# Patient Record
Sex: Male | Born: 1935 | Race: White | Hispanic: No | Marital: Married | Smoking: Former smoker
Health system: Southern US, Community
[De-identification: ages and names within clinical notes are randomized; demographics above are authoritative.]

## PROBLEM LIST (undated history)

## (undated) DIAGNOSIS — D509 Iron deficiency anemia, unspecified: Secondary | ICD-10-CM

## (undated) DIAGNOSIS — E785 Hyperlipidemia, unspecified: Secondary | ICD-10-CM

## (undated) DIAGNOSIS — N400 Enlarged prostate without lower urinary tract symptoms: Secondary | ICD-10-CM

## (undated) DIAGNOSIS — K259 Gastric ulcer, unspecified as acute or chronic, without hemorrhage or perforation: Secondary | ICD-10-CM

## (undated) DIAGNOSIS — K449 Diaphragmatic hernia without obstruction or gangrene: Secondary | ICD-10-CM

## (undated) DIAGNOSIS — R490 Dysphonia: Secondary | ICD-10-CM

## (undated) DIAGNOSIS — Z9289 Personal history of other medical treatment: Secondary | ICD-10-CM

## (undated) DIAGNOSIS — R1314 Dysphagia, pharyngoesophageal phase: Secondary | ICD-10-CM

## (undated) DIAGNOSIS — G7000 Myasthenia gravis without (acute) exacerbation: Secondary | ICD-10-CM

## (undated) DIAGNOSIS — C801 Malignant (primary) neoplasm, unspecified: Secondary | ICD-10-CM

## (undated) DIAGNOSIS — K30 Functional dyspepsia: Secondary | ICD-10-CM

## (undated) DIAGNOSIS — H3530 Unspecified macular degeneration: Secondary | ICD-10-CM

## (undated) DIAGNOSIS — IMO0002 Reserved for concepts with insufficient information to code with codable children: Secondary | ICD-10-CM

## (undated) DIAGNOSIS — G629 Polyneuropathy, unspecified: Secondary | ICD-10-CM

## (undated) DIAGNOSIS — E538 Deficiency of other specified B group vitamins: Secondary | ICD-10-CM

## (undated) DIAGNOSIS — K117 Disturbances of salivary secretion: Secondary | ICD-10-CM

## (undated) DIAGNOSIS — D126 Benign neoplasm of colon, unspecified: Secondary | ICD-10-CM

## (undated) DIAGNOSIS — I10 Essential (primary) hypertension: Secondary | ICD-10-CM

## (undated) DIAGNOSIS — E559 Vitamin D deficiency, unspecified: Secondary | ICD-10-CM

## (undated) DIAGNOSIS — H269 Unspecified cataract: Secondary | ICD-10-CM

## (undated) DIAGNOSIS — K219 Gastro-esophageal reflux disease without esophagitis: Secondary | ICD-10-CM

## (undated) HISTORY — DX: Gastro-esophageal reflux disease without esophagitis: K21.9

## (undated) HISTORY — DX: Reserved for concepts with insufficient information to code with codable children: IMO0002

## (undated) HISTORY — DX: Functional dyspepsia: K30

## (undated) HISTORY — DX: Essential (primary) hypertension: I10

## (undated) HISTORY — DX: Vitamin D deficiency, unspecified: E55.9

## (undated) HISTORY — DX: Unspecified macular degeneration: H35.30

## (undated) HISTORY — DX: Diaphragmatic hernia without obstruction or gangrene: K44.9

## (undated) HISTORY — DX: Iron deficiency anemia, unspecified: D50.9

## (undated) HISTORY — PX: GASTROSTOMY W/ FEEDING TUBE: SUR642

## (undated) HISTORY — PX: HERNIA REPAIR: SHX51

## (undated) HISTORY — DX: Unspecified cataract: H26.9

## (undated) HISTORY — DX: Benign neoplasm of colon, unspecified: D12.6

## (undated) HISTORY — DX: Deficiency of other specified B group vitamins: E53.8

## (undated) HISTORY — PX: OTHER SURGICAL HISTORY: SHX169

## (undated) HISTORY — DX: Hyperlipidemia, unspecified: E78.5

## (undated) HISTORY — DX: Polyneuropathy, unspecified: G62.9

## (undated) HISTORY — PX: TONSILLECTOMY: SUR1361

## (undated) HISTORY — DX: Disturbances of salivary secretion: K11.7

## (undated) HISTORY — DX: Dysphonia: R49.0

## (undated) HISTORY — PX: CHOLECYSTECTOMY: SHX55

## (undated) HISTORY — DX: Benign prostatic hyperplasia without lower urinary tract symptoms: N40.0

## (undated) HISTORY — DX: Gastric ulcer, unspecified as acute or chronic, without hemorrhage or perforation: K25.9

## (undated) HISTORY — DX: Dysphagia, pharyngoesophageal phase: R13.14

## (undated) HISTORY — DX: Myasthenia gravis without (acute) exacerbation: G70.00

---

## 1963-09-16 DIAGNOSIS — G629 Polyneuropathy, unspecified: Secondary | ICD-10-CM

## 1963-09-16 HISTORY — DX: Polyneuropathy, unspecified: G62.9

## 1998-12-20 ENCOUNTER — Ambulatory Visit (HOSPITAL_BASED_OUTPATIENT_CLINIC_OR_DEPARTMENT_OTHER): Admission: RE | Admit: 1998-12-20 | Discharge: 1998-12-20 | Payer: Self-pay | Admitting: Otolaryngology

## 1999-09-16 DIAGNOSIS — IMO0002 Reserved for concepts with insufficient information to code with codable children: Secondary | ICD-10-CM

## 1999-09-16 HISTORY — DX: Reserved for concepts with insufficient information to code with codable children: IMO0002

## 2000-05-07 ENCOUNTER — Encounter (INDEPENDENT_AMBULATORY_CARE_PROVIDER_SITE_OTHER): Payer: Self-pay | Admitting: Specialist

## 2000-05-07 ENCOUNTER — Ambulatory Visit (HOSPITAL_BASED_OUTPATIENT_CLINIC_OR_DEPARTMENT_OTHER): Admission: RE | Admit: 2000-05-07 | Discharge: 2000-05-07 | Payer: Self-pay | Admitting: Otolaryngology

## 2000-09-15 HISTORY — PX: OTHER SURGICAL HISTORY: SHX169

## 2000-11-02 ENCOUNTER — Ambulatory Visit (HOSPITAL_COMMUNITY): Admission: RE | Admit: 2000-11-02 | Discharge: 2000-11-02 | Payer: Self-pay | Admitting: Gastroenterology

## 2002-08-05 ENCOUNTER — Ambulatory Visit (HOSPITAL_COMMUNITY): Admission: RE | Admit: 2002-08-05 | Discharge: 2002-08-05 | Payer: Self-pay | Admitting: Orthopedic Surgery

## 2002-08-05 ENCOUNTER — Encounter: Payer: Self-pay | Admitting: Orthopedic Surgery

## 2002-08-12 ENCOUNTER — Encounter: Admission: RE | Admit: 2002-08-12 | Discharge: 2002-08-12 | Payer: Self-pay | Admitting: Orthopedic Surgery

## 2002-08-12 ENCOUNTER — Encounter: Payer: Self-pay | Admitting: Orthopedic Surgery

## 2002-08-15 ENCOUNTER — Ambulatory Visit (HOSPITAL_BASED_OUTPATIENT_CLINIC_OR_DEPARTMENT_OTHER): Admission: RE | Admit: 2002-08-15 | Discharge: 2002-08-15 | Payer: Self-pay | Admitting: Orthopedic Surgery

## 2002-09-15 HISTORY — PX: HIATAL HERNIA REPAIR: SHX195

## 2002-09-20 ENCOUNTER — Ambulatory Visit (HOSPITAL_COMMUNITY): Admission: RE | Admit: 2002-09-20 | Discharge: 2002-09-20 | Payer: Self-pay | Admitting: Family Medicine

## 2002-09-20 ENCOUNTER — Inpatient Hospital Stay (HOSPITAL_COMMUNITY): Admission: AD | Admit: 2002-09-20 | Discharge: 2002-09-26 | Payer: Self-pay | Admitting: Internal Medicine

## 2002-09-20 ENCOUNTER — Encounter: Payer: Self-pay | Admitting: Family Medicine

## 2002-09-21 ENCOUNTER — Encounter: Payer: Self-pay | Admitting: Internal Medicine

## 2002-09-21 ENCOUNTER — Encounter: Payer: Self-pay | Admitting: Cardiology

## 2002-09-22 ENCOUNTER — Encounter: Payer: Self-pay | Admitting: Internal Medicine

## 2002-09-23 ENCOUNTER — Encounter: Payer: Self-pay | Admitting: Internal Medicine

## 2002-09-26 ENCOUNTER — Encounter: Payer: Self-pay | Admitting: Internal Medicine

## 2002-10-17 ENCOUNTER — Inpatient Hospital Stay (HOSPITAL_COMMUNITY): Admission: RE | Admit: 2002-10-17 | Discharge: 2002-10-20 | Payer: Self-pay | Admitting: *Deleted

## 2003-03-14 ENCOUNTER — Ambulatory Visit (HOSPITAL_COMMUNITY): Admission: RE | Admit: 2003-03-14 | Discharge: 2003-03-14 | Payer: Self-pay | Admitting: *Deleted

## 2003-03-27 ENCOUNTER — Ambulatory Visit (HOSPITAL_COMMUNITY): Admission: RE | Admit: 2003-03-27 | Discharge: 2003-03-27 | Payer: Self-pay | Admitting: *Deleted

## 2003-09-06 ENCOUNTER — Ambulatory Visit (HOSPITAL_COMMUNITY): Admission: RE | Admit: 2003-09-06 | Discharge: 2003-09-06 | Payer: Self-pay | Admitting: Gastroenterology

## 2003-09-06 ENCOUNTER — Encounter (INDEPENDENT_AMBULATORY_CARE_PROVIDER_SITE_OTHER): Payer: Self-pay | Admitting: *Deleted

## 2003-09-16 HISTORY — PX: STOMACH SURGERY: SHX791

## 2003-11-09 ENCOUNTER — Ambulatory Visit (HOSPITAL_COMMUNITY): Admission: RE | Admit: 2003-11-09 | Discharge: 2003-11-09 | Payer: Self-pay | Admitting: Gastroenterology

## 2006-08-18 DIAGNOSIS — C4491 Basal cell carcinoma of skin, unspecified: Secondary | ICD-10-CM

## 2006-08-18 HISTORY — DX: Basal cell carcinoma of skin, unspecified: C44.91

## 2007-07-30 ENCOUNTER — Encounter: Admission: RE | Admit: 2007-07-30 | Discharge: 2007-07-30 | Payer: Self-pay | Admitting: Internal Medicine

## 2007-09-16 DIAGNOSIS — H269 Unspecified cataract: Secondary | ICD-10-CM

## 2007-09-16 HISTORY — DX: Unspecified cataract: H26.9

## 2008-05-03 DIAGNOSIS — C4492 Squamous cell carcinoma of skin, unspecified: Secondary | ICD-10-CM

## 2008-05-03 HISTORY — DX: Squamous cell carcinoma of skin, unspecified: C44.92

## 2011-01-31 NOTE — Discharge Summary (Signed)
NAME:  Victor Sutton, Victor Sutton                        ACCOUNT NO.:  [id number]   MEDICAL RECORD NO.:  [id number]                   PATIENT TYPE:  INP   LOCATION:  5154                                 FACILITY:  [facility]   PHYSICIAN:  Lazaro Arms, M.D.        DATE OF BIRTH:  07/23/1928   DATE OF ADMISSION:  09/20/2002  DATE OF DISCHARGE:  09/26/2002                                 DISCHARGE SUMMARY   PRIMARY CARE PHYSICIAN:  Dellis Anes. Idell Pickles, M.D.   GASTROENTEROLOGIST:  Danise Edge, M.D.   SURGEON:  Vikki Ports, M.D.   DISCHARGE DIAGNOSES:  1. Symptomatic anemia secondary to upper gastrointestinal bleed.  2. Upper gastrointestinal bleed, resolved, secondary to #3.  3. Paraesophageal hernia.  4. Hypertension.  5. Benign prostatic hypertrophy.  6. History of diverticulosis.  7. History of oral leukoplakia.   DISCHARGE MEDICATIONS:  1. Avapro 150 mg p.o. daily.  2. Flomax 0.4 mg p.o. daily.  3. Aspirin 325 mg p.o. daily.  4. Protonix 40 mg p.o. daily.  5. Iron sulfate 325 mg p.o. t.i.d. x90 days.  6. Folic acid 1 mg p.o. daily A54 days.  7. Thiamine 100 mg p.o. daily x90 days.  8. Colace 100 mg p.o. b.i.d. p.r.n.   ALLERGIES:  No known drug allergies.   PROCEDURES:  1. Esophagogastroduodenoscopy performed by Dr. Laural Benes on September 21, 2002,     tolerated without complications.  Findings include paraesophageal hernia     with three ulcerative erosions and small ulcers at the point of     transition.  No active bleeding.  2. Esophageal manometry performed on September 26, 2002 by the radiology     department at [facility].  Results are pending.   HISTORY OF PRESENT ILLNESS:  The patient was seen by his primary M.D. on  September 19, 2002 for increasing fatigue, dyspnea on exertion even with minor  exertion, pallor, cold intolerance over a three-week period.  He had CT of  chest today at [facility] to rule out PU.  There was no  acute disease noted  or no evidence of PE.  Hemoglobin drawn on September 19, 2002 at primary M.D's office indicated hemoglobin had decreased from 14.8 to  8.3 over a six-month period.  The patient had arthroscopic knee surgery on  left knee August 16, 2003 at [facility].  Denies nausea,  vomiting, bright red blood per rectum.  He has noticed stools getting darker  in color over the last three weeks.  Denies abdominal pain or change in  bowel pattern.  Denies fever.  States cold all the time, especially his  upper body.  He has noted fuzzy vision for 2-3 weeks, especially with  reading.  Recent increase in headaches from rare to few.  Takes Advil for  same.  Occasional night sweats for the last year with ankle swelling off and  on, left greater than right.  Last colonoscopy was performed in 2002.  Denies urinary changes.  No PND or orthopnea.  The patient is admitted for  further evaluation and treatment of symptomatic anemia.   HOSPITAL COURSE:  The patient's hemoglobin and hematocrit were reduced, as  noted above.  Vital signs at time of admission:  Temperature 96.6, blood  pressure 124/83, pulse 88, respirations 20, O2 saturations were 98% on room  air.  The patient did present as being pale and quite tired-appearing.  The  patient was transfused 2 units packed RBC's during this admission and his  hemoglobin on day of discharge is 10.7 with a hematocrit of 33.5.  Vital  signs are stable.  Pulse rate 76, blood pressure 117/66.   The patient underwent GI evaluation by Dr. Danise Edge on September 21, 2002  including an EGD which revealed the presence of a paraesophageal hernia with  multiple erosions which were ulcerated.  Dr. Laural Benes feels that the  patient's upper GI bleeding was most likely related to ischemia.  The  remainder of the patient's EGD was unremarkable.  Dr. Luan Pulling was  consulted, as she has performed surgery on the patient in the past.  She  consulted on the patient with Dr.  Laural Benes and Dr. Jonell Cluck and agreed  that the patient would require surgical correction of his hernia but that  esophageal manometry would have to be performed prior to surgery.  The  patient was scheduled for manometry to be performed on September 26, 2002.   As part of the patient's workup for symptomatic anemia, he was evaluated for  iron deficiency and found to in fact be iron deficient with iron stores of  less than 10 and a ferritin of 5.  The patient's vitamin B6 level was 17.9.  The patient was started on iron therapy and has continued on same.  He will  need iron therapy for three months' time approximately.  This needs to be  evaluated on a periodic basis by the patient's primary care M.D.  Reticulocyte count was also performed.  The patient was found to have 1.9%  reticulocytes and RBC count was 3.87 and absolute reticulocytes were 73.5.   Additionally, in order to work the patient up for symptomatic anemia and  fatigue, echocardiogram was obtained which initially indicated that the  patient may have cardiomyopathy with an EF of 30%-40%.  Because of this,  cardiology consult was requested and performed by Dr. Verdis Prime.  The  patient underwent stress echocardiogram which further clarified the  patient's cardiac status as being within normal limits.  EF was estimated at  65%, LV function was normal, and there were no indications of ischemia.  Additionally, cardiac enzymes were negative during this visit.  The  patient's lipid profile includes total cholesterol of 123, triglycerides of  73, HDL 45, LDL 63.  There was no further followup for cardiac issues  required.   The patient was maintained in the hospital until his manometry could be  performed due to his fluctuating hemoglobin; however, hemoglobin has been  stable for 48 hours prior to discharge.  The patient's fatigue is greatly reduced and his endurance has increased.  He reports that he feels much  better overall  since having received blood.   DISCHARGE LABORATORY DATA:  WBC 5.4, platelet count 313,000.  Coags were  normal.  Sodium 141, potassium 3.7, BUN 12, creatinine 1.   CONSULTATIONS:  1. Dr. Verdis Prime for cardiology.  2. Dr. Danna Hefty for surgery.  3. Dr. Danise Edge for GI.   CONDITION AT DISCHARGE:  Good.   DISPOSITION:  Discharged to home.   FOLLOW UP:  Followup is on September 28, 2002 at 4:15 p.m. with Dr. Danna Hefty.  She will discuss results of manometry testing and to schedule  surgery.  The patient is also instructed that if there will be a delay of  two weeks or more between his discharge and his surgery that he is to return  to his primary care physician to have his hemoglobin checked.  If there will  be less than two weeks' time elapsing between discharge and surgery,  followup is not required at this time, as the patient's hemoglobin will be  checked during his presurgical testing.  The patient obviously will need  continued followup for his hypertension, benign prostatic hypertrophy, and  anemia once his surgery has been completed.  Discharge recommendations will  be available at that time.  The patient's primary physician has been  apprised of his condition and followup needs.     Ellender Hose. Earlene Plater, N.P.                    Lazaro Arms, M.D.    SMD/MEDQ  D:  09/26/2002  T:  09/26/2002  Job:  [id number]   cc:   Lesleigh Noe, M.D.  [address]  [address]  [place]  [place] [address]  Fax: [phone]   Danise Edge, M.D.  [address]  [address]  [place]  [place] [address]  Fax: [phone]   Vikki Ports, M.D.  [address]  [place]  [place] [address]  Fax: [phone]   Dellis Anes. Idell Pickles, M.D.  [address]  [place]  [place] [address]  Fax: [phone]

## 2011-01-31 NOTE — Procedures (Signed)
[place]  Patient:    Victor Sutton, Victor Sutton                    MRN: [id number] Proc. Date: 11/02/00 Adm. Date:  11/02/00 Attending:  Verlin Grills, M.D. CC:         Dellis Anes. Idell Pickles, M.D.   Procedure Report  REFERRING PHYSICIAN:  Dellis Anes. Idell Pickles, M.D.  PROCEDURE INDICATION:  [name] (date of birth Aug 26, 1928), is a 77 year old male.  On Jan 23, 1995, he underwent a diagnostic colonoscopy to evaluate Hemoccult positive stool.  Neoplastic but noncancerous polyps were removed from the sigmoid colon, and mid transverse colon.  [name] is due for surveillance colonoscopy with possible polypectomy to prevent colon cancer.  I discussed with Mr. Nishiyama the complications associated with colonoscopy and polypectomy including intestinal bleeding and intestinal perforation.  Mr. Loll has signed the operative permit.  ENDOSCOPIST:  Dr. Charolett Bumpers, III.  PREMEDICATION:  Fentanyl 25 mcg, Versed 3 mg.  ENDOSCOPE:  An Olympus pediatric colonoscope.  PROCEDURE:  After obtaining informed consent the patient was placed in the left lateral decubitus position.  I administered intravenous fentanyl and intravenous versed to achieve conscious sedation for the procedure.  The patients blood pressure, oxygen saturation and cardiac rhythm were monitored throughout the procedure and documented in the medical record.  Anal inspection was normal.  Digital rectal exam was normal.  The Olympus Pediatric Video Colonoscope was introduced into the rectum and under direct vision advanced to the cecum as identified by a normal appearing ileocecal valve. The ileocecal valve was easily intubated and the distal ileum inspected. Colonic preparation for the exam today was excellent.  RECTUM:  Normal.  SIGMOID COLON AND DESCENDING COLON:  Small scattered left colonic diverticulosis with evidence of diverticulitis or neoplasia.  SPLENIC  FLEXURE:  Normal.  TRANSVERSE COLON:  Normal.  HEPATIC FLEXURE:  Normal.  ASCENDING COLON:  Normal.  CECUM AND ILEOCECAL VALVE:  Normal.  DISTAL ILEUM:  Normal.  ASSESSMENT:  Normal proctocolonoscopy to the cecum with intubation of the ileocecal valve and distal ileal inspection except for the presence of a few small colonic diverticula in the left colon.  There is no endoscopic evidence for the presence of recurrent colorectal neoplasia.  RECOMMENDATIONS:  Repeat colonoscopy in approximately 5 years. DD:  11/02/00 TD:  11/02/00 Job: [id number]

## 2011-01-31 NOTE — Op Note (Signed)
[place]. [facility]  Patient:    Victor Sutton, Victor Sutton                       MRN: [id number] Proc. Date: 05/07/00 Adm. Date:  05/07/00 Attending:  Margit Banda. Jearld Fenton, M.D. CC:         Dellis Anes. Idell Pickles, M.D.                           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  PROCEDURE:  Excision of lower lip leukoplakia and laser excision of buccal mucosa lesions.  ANESTHESIA:  General endotracheal tube.  ESTIMATED BLOOD LOSS:  Less than 5 cc.  INDICATIONS:  This is a 77 year old who seems to have a problem with repetitive leukoplakia and has previously had a large area excised from the floor of mouth and lateral tongue.  He now has areas on his lower lip bilaterally and in the buccal mucosa that has not resolved.  It does seem to be worsening slightly.  He is informed of the risks and benefits of the procedure including bleeding, infection, numbness of his lips, scarring, recurrence, and risk of the anesthetic.  All questions were answered and consent was obtained.  OPERATION:  The patient was taken to the operating room and placed in supine position.  Adequate general endotracheal tube anesthesia was draped in the usual sterile manner.  The leukoplakia on the left lower lip was outlined and excised with a 15 blade after injecting with 1% lidocaine with 1:100,000 epinephrine.  This was dissected free with a 15 blade.  The flap was elevated in the medial aspect of the lips and then closed with interrupted 4-0 chromic. The right side of the lower lip also had an area of leukoplakia that was excised again, same technique, and closed with interrupted 4-0 chromic.  The areas of leukoplakia on the buccal mucosa near the anterior commissure were lasered with the c02 laser set on 4 watts bilaterally to excoriate and remove this lining.  The  patient was then awakened, brought to recovery in stable condition.  Counts correct. DD:  05/07/00 TD:  05/08/00 Job: [id number]

## 2011-01-31 NOTE — Consult Note (Signed)
NAME:  Victor Sutton, Victor Sutton                        ACCOUNT NO.:  [id number]   MEDICAL RECORD NO.:  [id number]                   PATIENT TYPE:  INP   LOCATION:  5154                                 FACILITY:  [facility]   PHYSICIAN:  Lesleigh Noe, M.D.            DATE OF BIRTH:  [birth year]   DATE OF CONSULTATION:  09/22/2002  DATE OF DISCHARGE:                                   CONSULTATION   INDICATIONS:  Abnormal echocardiogram demonstrating hypokinesis of the left  ventricle.   CONCLUSIONS:  1. Recent exertional dyspnea and fatigue felt secondary to significant     anemia with admitting hemoglobin of 8.3.  Rule out contribution from left     ventricular dysfunction (see below).  2. Mild cardiomegaly on chest x-ray with echocardiogram demonstrating     diffuse hypokinesis and ejection fraction of 30-40%.  3. Longstanding history of hypertension, although not recently on therapy     because blood pressure normal.  4. Longstanding history of obesity.  5. Anemia secondary to gastrointestinal bleeding, most likely paraesophageal     hernia with erosions and blood loss.   RECOMMENDATIONS:  1. Adenosine Cardiolite to rule out the possibility of ischemic heart     disease.  2. Will require therapy for prevention of progressive LV dysfunction,     including ACE or ARB and beta blocker therapy plus modest diuretic     therapy.  3. If perfusion abnormalities are noted with Cardiolite scintigraphy, will     require coronary angiography to exclude significant coronary artery     disease.   COMMENTS:  The patient is 32 and has a longstanding history of hypertension.  He was admitted with exertional fatigue and dyspnea and found to have a  hemoglobin of 8.3, but following endoscopy of his esophagus and stomach were  felt to be secondary to a paraesophageal hernia with erosive gastritis and  bleeding.  An echocardiogram was performed as a further investigation into  the patient's fatigue  and borderline cardiomegaly on chest x-ray, and he was  felt to have a normal LV cavity size with diffuse hypokinesis and mild to  moderate decrease in LV function.   He denies exertional chest discomfort, pressure, heaviness, squeezing,  orthopnea, PND.  He has intermittent lower extremity swelling.  This has  been chronic over many, many years.  There is no prior history of heart  disease, heart murmur.  He has had hypertension for greater than 10 years.  This has for the most part been treated, although more recently the blood  pressure has been under good control on no medical therapy.   HABITS:  A greater than 25-year history of smoking, although he discontinued  cigarette smoking 25 years ago.  He drinks alcohol sparingly, has never been  a heavy or daily drinker.   FAMILY HISTORY:  Father died at 30 of complications of heart disease.  His  mother  lived to be 92.  He has two sisters, one died of cancer, the other is  alive and well.   SIGNIFICANT MEDICAL PROBLEMS:  1. Hypertension.  2. Benign prostatic hypertrophy.  3. Left knee arthritis.  4. History of colonic polyps.  5. Benign prostatic hypertrophy.   PHYSICAL EXAMINATION:  GENERAL:  The patient is in no acute distress.  VITAL SIGNS:  Blood pressure 130/70, heart rate 90, respirations 18 and  nonlabored,  temperature 98.3.  SKIN:  Clear, no cyanosis.  HEENT:  Extraocular movements were full.  NECK:  No JVD, carotid bruits, or thyromegaly.  CHEST:  Lungs are clear to auscultation and percussion.  CARDIAC:  No murmur, click,  gallop, or rub.  ABDOMEN:  Obese.  The liver edge is not palpable.  Bowel sounds are normal.  EXTREMITIES:  No edema.  Posterior tibial pulses are 2+.  No bruits.  NEUROLOGIC:  The patient is alert, oriented x3.  No motor or sensory deficit  is noted.   LABORATORY DATA:  EKG reveals normal sinus rhythm, P-wave amplitude is low.  Chest x-ray reveals borderline cardiomegaly, hiatal hernia.  Echo  results  have already been summarized.   BNP is less than or equal to 30.   DISCUSSION:  The patient has mild cardiomegaly and diffuse mild to moderate  reduction in LV function.  Coronary artery disease needs to be excluded  given the patient's age and prior history of smoking.  Other possibilities  include obesity-related heart disease and hypertension as etiologies.  We  will perform an adenosine Cardiolite in the absence of any symptoms  suggestive of ischemic heart disease and if this does not reveal any  perfusion abnormality, no further workup will be necessary.  If perfusion  abnormalities are noted, coronary angiography may be required.                                                 Lesleigh Noe, M.D.    HWS/MEDQ  D:  09/23/2002  T:  09/23/2002  Job:  [id number]   cc:   Dellis Anes. Idell Pickles, M.D.  [address]  [place]  [place] [address]  Fax: [phone]

## 2011-01-31 NOTE — Op Note (Signed)
NAME:  Victor Sutton, Victor Sutton                        ACCOUNT NO.:  [id number]   MEDICAL RECORD NO.:  [id number]                   PATIENT TYPE:  AMB   LOCATION:  ENDO                                 FACILITY:  [facility]   PHYSICIAN:  Danise Edge, M.D.                DATE OF BIRTH:  03/22/1928   DATE OF PROCEDURE:  11/09/2003  DATE OF DISCHARGE:                                 OPERATIVE REPORT   PROCEDURE:  Esophagogastroduodenoscopy.   PROCEDURE INDICATION:  [name] is a 77 year old male born  [birth year].  On September 06, 2003, he underwent an  esophagogastroduodenoscopy to evaluate guaiac-positive stool.  A 2 mm ulcer  was present in the prepyloric gastric antrum.  Biopsies of the ulcer  revealed regenerative antral-type gastric mucosa, negative for Helicobacter  pylori antral gastritis.  [name] has completed proton pump inhibitor  therapy, and repeat upper endoscopy is scheduled to confirm gastric ulcer  healing.   On November 02, 2000, proctocolonoscopy to the cecum revealed left colonic  diverticulosis but no endoscopic evidence for the presence of colorectal  neoplasia.  [name] has undergone laparoscopic cholecystectomy in the  past.  He was diagnosed with a paraesophageal hernia.  On October 17, 2002,  Vikki Ports, M.D., performed laparoscopic repair of a  paraesophageal hernia with gastropexy.   ENDOSCOPIST:  Danise Edge, M.D.   PREMEDICATION:  Demerol 50 mg, Versed 2.5 mg.   DESCRIPTION OF PROCEDURE:  After obtaining informed consent, [name] was  placed in the left lateral decubitus position.  I administered intravenous  Demerol and intravenous Versed to achieve conscious sedation for the  procedure.  The patient's blood pressure, oxygen saturation, and cardiac  rhythm were monitored throughout the procedure and documented in the medical  record.   The Olympus gastroscope was passed through the posterior hypopharynx into  the  proximal esophagus without difficulty.  The hypopharynx, larynx, and  vocal cords appeared normal.   Esophagoscopy:  The proximal, mid-, and lower segments of the esophagus  appear normal.  The squamocolumnar junction is noted at 40 cm from the  incisor teeth.  There is no endoscopic evidence for the presence of  Barrett's esophagus.   Gastroscopy:  [name] has a large hiatal hernia.  A retroflexed view of  the gastric cardia and fundus was normal.  The gastric body, antrum, and  pylorus appeared normal.  The prepyloric gastric antral ulcer has healed.   Duodenoscopy:  The duodenal bulb and descending duodenum appear normal.   ASSESSMENT:  Healed prepyloric gastric antral ulcer.                                               Danise Edge, M.D.    MJ/MEDQ  D:  11/09/2003  T:  11/09/2003  Job:  [id number]   cc:   Vikki Ports, M.D.  [address]  [place]  [place] [address]  Fax: [phone]

## 2011-01-31 NOTE — Discharge Summary (Signed)
   NAME:  Victor Sutton, Victor Sutton                        ACCOUNT NO.:  [id number]   MEDICAL RECORD NO.:  [id number]                   PATIENT TYPE:  INP   LOCATION:  0371                                 FACILITY:  [facility]   PHYSICIAN:  Vikki Ports, M.D.         DATE OF BIRTH:  1928/05/21   DATE OF ADMISSION:  10/17/2002  DATE OF DISCHARGE:  10/20/2002                                 DISCHARGE SUMMARY   ADMISSION DIAGNOSIS:  Paraesophageal hernia.   DISCHARGE DIAGNOSIS:  Paraesophageal hernia.   PROCEDURE:  Laparoscopic repair of paraesophageal hernia, Troy Sine, M.D.   CONDITION ON DISCHARGE:  Good and improved.   DISPOSITION:  Discharged to home.   DISCHARGE MEDICATIONS:  1. Protonix 40 mg once a day.  2. Avapro.  3. Flomax 4 mg daily.  4. Iron sulfate 325 mg three times a day.  5. Tylenol p.r.n. for pain.   HOSPITAL COURSE:  The patient was admitted, underwent laparoscopic  paraesophageal hernia repair. Postoperative day #1, NG tube was removed. On  postoperative day #1, he was started on clear sips. He did very well except  for one episode of urinary retention after the Foley catheter was removed.  On postoperative day #2, he started on a mechanical soft diet, required no  pain medication, tolerated this diet well and was ready for discharge on  postoperative day #3. Followup is with me in one week.                                                Vikki Ports, M.D.    KRH/MEDQ  D:  10/20/2002  T:  10/20/2002  Job:  [id number]   cc:   Danise Edge, M.D.  [address]  [place]  [place] [address]  Fax: [phone]   Lyn Records III, M.D.  [address]  [place]  [place] [address]  Fax: [phone]   Lazaro Arms, M.D.  [address] [place], [place] [address]  Fax: [phone]

## 2011-01-31 NOTE — Op Note (Signed)
NAME:  Victor Sutton, Victor Sutton                        ACCOUNT NO.:  [id number]   MEDICAL RECORD NO.:  [id number]                   PATIENT TYPE:  AMB   LOCATION:  ENDO                                 FACILITY:  [facility]   PHYSICIAN:  Danise Edge, M.D.                DATE OF BIRTH:  1927-11-28   DATE OF PROCEDURE:  09/06/2003  DATE OF DISCHARGE:                                 OPERATIVE REPORT   PROCEDURE:  Esophagogastroduodenoscopy.   INDICATIONS FOR PROCEDURE:  Victor Sutton is a 77 year old male born  Dec 19, 1927.  Victor Sutton has guaiac positive stool associated with a  hemoglobin 13.2 g.   November 02, 2000 proctocolonoscopy to the cecum revealed left colonic  diverticulosis but no endoscopic evidence for the presence of colorectal  neoplasia.   Victor Sutton has undergone a laparoscopic cholecystectomy in the past.  He  was diagnosed with a paraesophageal hernia.  On October 17, 2002, Dr.  Danna Hefty performed a laparoscopic repair of the paraesophageal  hernia and gastropexy.   Victor Sutton does take a baby aspirin daily.   ENDOSCOPIST:  Danise Edge, M.D.   PREMEDICATION:  Versed 3 mg, Demerol 25 mg.   DESCRIPTION OF PROCEDURE:  After obtaining informed consent, Victor Sutton was  placed in the left lateral decubitus position. I administered 3 mg  intravenous Versed and 25 mg intravenous Demerol to achieve conscious  sedation for the procedure. The patient's blood pressure, oxygen saturation  and cardiac rhythm were monitored throughout the procedure and documented in  the medical record.   The Olympus gastroscope was passed through the posterior hypopharynx into  the proximal esophagus without difficulty. The hypopharynx, larynx and vocal  cords appeared normal.   ESOPHAGOSCOPY:  The proximal, mid and lower segments of the esophageal  mucosa appeared normal.  The squamocolumnar junction is noted at  approximately 35 cm from the incisor teeth.   GASTROSCOPY:  There appears to be a moderate sized hiatal hernia.  Retroflexed view of the gastric cardia and fundus was normal. The gastric  body appeared normal. There are scattered circular erosions in the gastric  antrum. In the prepyloric gastric antrum, there is a 2 mm x 2 mm ulcer with  exudative base and no stigmata of bleeding.  Biopsies were obtained from the  benign appearing prepyloric gastric ulcer. The pylorus appears normal.   DUODENOSCOPY:  The duodenal bulb and descending duodenum appear normal.   ASSESSMENT:  1. February 2004, laparoscopic repair of paraesophageal hernia and     gastropexy.  2. February 2002, proctocolonoscopy to the cecum reveals colonic     diverticulosis.  3. Scattered gastric antral erosions associated with a 2 mm x 2 mm     prepyloric gastric antral ulcer probably secondary to aspirin. Biopsies     pending.   RECOMMENDATIONS:  Discontinue aspirin. Start proton pump inhibitor.  Danise Edge, M.D.    MJ/MEDQ  D:  09/06/2003  T:  09/06/2003  Job:  [id number]   cc:   Dellis Anes. Idell Pickles, M.D.  [address]  [place]  [place] [address]  Fax: [phone]   Vikki Ports, M.D.  [phone]  [place]  [place] [address]  Fax: [phone]

## 2011-01-31 NOTE — Op Note (Signed)
   NAME:  Victor Sutton, Victor Sutton                        ACCOUNT NO.:  [id number]   MEDICAL RECORD NO.:  [id number]                   PATIENT TYPE:  INP   LOCATION:  5154                                 FACILITY:  [facility]   PHYSICIAN:  Danise Edge, M.D.                DATE OF BIRTH:  Apr 09, 1928   DATE OF PROCEDURE:  09/27/2002  DATE OF DISCHARGE:  09/26/2002                                 OPERATIVE REPORT   PROCEDURE:  Esophageal manometry.   INDICATIONS FOR PROCEDURE:  [name] is a 77 year old male born  1928/09/07. [name] has a large paraesophageal hiatal hernia  complicated by upper gastrointestinal bleeding and iron deficiency anemia.  He is scheduled to undergo surgery to reduce his paraesophageal hiatal  hernia to be performed by Dr. Danna Hefty. A preoperative esophageal  manometry is performed.   Lower esophageal sphincter function:  Due to the presence of the large  paraesophageal hiatal hernia, there was some difficulty in placing the  manometry probe at the lower esophageal sphincter. This was performed under  fluoroscopic guidance and the data may be somewhat inaccurate. The resting  lower esophageal sphincter pressure is 19.1 mmHg which is well within the  normal range of 10-45 mmHg. There is 66% relaxation of the lower esophageal  sphincter with swallow which may be inaccurate due to the presence of the  paraesophageal hernia.   Lower esophageal body function:  Lower esophageal body function is based on  10 wet swallows. Ninety percent of the wet swallows resulted in peristaltic  contractions and only 10% in retrograde contractions. The distal esophageal  wave amplitude is normal at 71 mmHg.   ASSESSMENT:  Normal lower esophageal sphincter resting pressure and normal  esophageal body function.   RECOMMENDATIONS:  Proceed with surgery to reduce the paraesophageal hernia  and the patient is deemed a good candidate for fundoplication.                                               Danise Edge, M.D.    MJ/MEDQ  D:  09/27/2002  T:  09/27/2002  Job:  [id number]   cc:   Vikki Ports, M.D.  [address]  [place]  [place] [address]  Fax: [phone]

## 2011-01-31 NOTE — Op Note (Signed)
NAME:  Victor Sutton, Victor Sutton                        ACCOUNT NO.:  [id number]   MEDICAL RECORD NO.:  [id number]                   PATIENT TYPE:  INP   LOCATION:  0371                                 FACILITY:  [facility]   PHYSICIAN:  Vikki Ports, M.D.         DATE OF BIRTH:  1928/05/17   DATE OF PROCEDURE:  10/17/2002  DATE OF DISCHARGE:                                 OPERATIVE REPORT   PREOPERATIVE DIAGNOSIS:  Paraesophageal hernia.   POSTOPERATIVE DIAGNOSIS:  Paraesophageal hernia.   PROCEDURE:  Laparoscopic repair of paraesophageal hernia and gastropexy.   SURGEON:  Vikki Ports, M.D.   ASSISTANT:  Thornton Park. Daphine Deutscher, M.D.   ANESTHESIA:  General.   DESCRIPTION OF PROCEDURE:  The patient was taken to the operating room and  placed in a supine position.  After adequate anesthesia was induced using  endotracheal tube, the abdomen was prepped and draped in the normal sterile  fashion.  Using a supraumbilical vertical incision, I dissected down to the  fascia.  The fascia was opened vertically.  Hasson trocar was placed in the  abdomen, and pneumoperitoneum was obtained.  Under direct visualization, a  12 mm port was placed in the right upper quadrant and a second 10 mm trocar  was placed in the right upper quadrant.  A 5 mm trocar was placed in the  subxiphoid region, and the liver was retracted anteriorly.  A 10 mm port was  placed in the left upper quadrant, and an additional 5 mm port was placed in  the left upper quadrant.  The stomach was identified.  There was a  significant amount of fat in the upper part of the stomach.  The  paraesophageal hernia, which encompassed the majority of the greater  curvature of the stomach was reduced down into the abdominal cavity.  The  right crus and left crus were dissected free and identified.  There was a  rather large hiatal hernia defect.  Working from the lesser curvature, there  was a large, probable re-placed left  hepatic artery.  This was preserved.  After the crura were both well-visualized, they were reapproximated over a  50 French lighted Bougie using interrupted 0 Tycron sutures.  This was  accomplished with the EndoStitch device and intracorporal tying.  The  esophagogastric junction and the lower esophageal sphincter were intra-  abdominal; therefore, I felt no wrap was necessary.  Satisfied with the  repair, I then tacked the proximal greater curvature of the stomach to the  anterior abdominal wall with two separate 0 Tycron sutures.  The  pneumoperitoneum was released.  The supraumbilical fascial defect was closed  with an 0 Vicryl figure-of-eight suture.  All incisions were injected with  Marcaine and closed with staples.  The patient tolerated the procedure well  and went to PACU in good condition.  Vikki Ports, M.D.    KRH/MEDQ  D:  10/17/2002  T:  10/17/2002  Job:  [id number]   cc:   Danise Edge, M.D.  [address]  [place]  [place] [address]  Fax: [phone]   Lyn Records III, M.D.  [address]  [place]  [place] [address]  Fax: [phone]

## 2011-01-31 NOTE — Consult Note (Signed)
NAME:  Victor Sutton, Victor Sutton                        ACCOUNT NO.:  [id number]   MEDICAL RECORD NO.:  [id number]                   PATIENT TYPE:  INP   LOCATION:  5154                                 FACILITY:  [facility]   PHYSICIAN:  Velora Heckler, M.D.                DATE OF BIRTH:  12/23/1927   DATE OF CONSULTATION:  09/22/2002  DATE OF DISCHARGE:                                   CONSULTATION   REASON FOR CONSULTATION:  Probable paraesophageal hernia, upper  gastrointestinal bleed.   HISTORY OF PRESENT ILLNESS:  The patient is a pleasant 77 year old white  male admitted to [facility] on September 20, 2001 with fatigue,  increased shortness of breath, cold intolerance and pallor.  Patient  underwent initial assessment and was found to be significantly anemic with a  hemoglobin of 8.3.  Patient underwent thorough assessment.  He was noted to  have significant ankle swelling.  A venous duplex was performed in the  vascular lab which showed no evidence of deep venous thrombosis.  Patient  was transfused two units of packed red blood cells.  Cardiac echo was  performed which showed moderately significant decrease in ejection fraction.  Gastroenterology saw the patient in consultation and performed an upper  endoscopy on September 21, 2002.  This demonstrated a paraesophageal hernia  containing multiple superficial erosions and two small ulcers.  This was  felt to represent a source of bleeding which could explain the patient's  anemia.  Patient was scheduled for upper GI series which was performed  today.  No results are available at this time.  I will review that study.  Patient was also seen today in consultation by [facility].  He is  scheduled for an adenosine Cardiolite stress test on January 9.   REVIEW OF SYSTEMS:  Patient denies significant abdominal pain.  He does have  intermittent reflux symptoms.  He has noted dark stools over the past  several months.  He denies bright  red bleeding per rectum.  Patient takes  Pepcid on occasion, but his heartburn symptoms are relatively irregular.   PAST MEDICAL HISTORY:  1. Status post laparoscopic cholecystectomy by Dr. Danna Hefty     approximately six years ago.  2. Status post left knee arthroscopy in 2003.  3. Status post tonsillectomy.  4. Status post excision of oral leukoplakia by Dr. Suzanna Obey.  5. History of hypertension.  6. History of benign prostatic hypertrophy.  7. History of diverticulosis.   MEDICATIONS:  1. Avapro 150 mg daily.  2. Flomax 0.4 mg daily.  3. Aspirin daily.   ALLERGIES:  None known.   SOCIAL HISTORY:  Patient is married.  He is a retired Theatre stage manager.  He  denies tobacco use.  He drinks two or three alcoholic beverages per month.   PHYSICAL EXAMINATION:  GENERAL:  A 77 year old, well-developed, well-  nourished white male in no acute  distress.  VITAL SIGNS:  Temperature 98.7, pulse 78, respirations 20, blood pressure  134/64.  HEENT:  Normocephalic, atraumatic.  Sclerae clear.  Dentition is good.  Voice quality is normal.  NECK:  Anterior examination of the neck shows it to be symmetric.  Thyroid  is normal without nodularity.  There is no anterior or posterior cervical  adenopathy.  There are no supraclavicular masses.  LUNGS:  Clear to auscultation.  CARDIAC:  Regular rate and rhythm without murmur.  ABDOMEN:  Soft, obese with active bowel sounds.  There is no tenderness.  There are no masses.  There is no hepatosplenomegaly.  Surgical wounds are  well healed without signs of herniation.  EXTREMITIES:  Nontender without significant edema on exam this evening.  NEUROLOGICAL:  The patient is alert and oriented without focal deficit.   LABORATORY DATA:  Laboratory studies reviewed in the hospital chart.  Upper  endoscopy report from January 7 reviewed on the patient's chart.  Upper GI  series will be reviewed in radiology with the radiologist this evening.    DIAGNOSIS:  History of hiatal hernia versus paraesophageal hernia with upper  gastrointestinal bleeding.   PLAN:  I will review the upper GI series.  Perhaps it will shed light on  whether this represents a hiatal hernia with reflux esophagitis and  ulceration versus a true paraesophageal hernia and which particular type.  Certainly at this point the patient's cardiac evaluation will take  precedence.  Once cardiology has cleared the patient for operative  intervention, the patient will then undergo further assessment of his  esophageal function and suitability for repair of either hiatal hernia or  paraesophageal hernia.  I will discuss the case at length with Dr. Danna Hefty who is his primary surgeon.                                                Velora Heckler, M.D.    TMG/MEDQ  D:  09/22/2002  T:  09/23/2002  Job:  [id number]   cc:   Lazaro Arms, M.D.  [address] [place], [place] [address]  Fax: [phone]   Lyn Records III, M.D.  [address]  [place]  [place] [address]  Fax: [phone]   Velora Heckler, M.D.  [address] [place]  [place] [address]  Fax: [phone]

## 2011-01-31 NOTE — Op Note (Signed)
   NAME:  Victor Sutton, Victor Sutton                        ACCOUNT NO.:  [id number]   MEDICAL RECORD NO.:  [id number]                   PATIENT TYPE:  AMB   LOCATION:  DAY                                  FACILITY:  [facility]   PHYSICIAN:  Vikki Ports, M.D.         DATE OF BIRTH:  1928-09-09   DATE OF PROCEDURE:  03/27/2003  DATE OF DISCHARGE:  03/27/2003                                 OPERATIVE REPORT   PREOPERATIVE DIAGNOSIS:  Ventral hernia.   POSTOPERATIVE DIAGNOSIS:  Ventral hernia.   PROCEDURE:  Laparoscopic ventral hernia repair with mesh.   SURGEON:  Vikki Ports, M.D.   ASSISTANT:  Sandria Bales. Ezzard Standing, M.D.   ANESTHESIA:  General.   DESCRIPTION OF PROCEDURE:  The patient was taken to the operating room and  placed in a supine position.  After adequate anesthesia was induced using  endotracheal tube, the abdominal was prepped and draped in normal sterile  fashion.  Using a 10 mm incision in the right upper quadrant, a Veress  needle was introduced and pneumoperitoneum was obtained.  A 10 mm trocar was  placed using the Optiview technique.  A defect was easily seen in the mid  abdomen.  There were no intraabdominal contents.  Two additional 5 mm ports  were placed in the left abdomen and right lower quadrant.  The defect  measured 4 cm x 4 cm.  A 7 x 7 cm piece of polyester mesh was then placed  into the abdomen, after interrupted 0 Novofil sutures had been placed.  These were tied to the fascia and the periphery of the mesh was tacked.  This gave excellent coverage of the hernia defect.  Adequate hemostasis was  ensured.  Trocars were removed.  Skin was closed with staples.  The patient  tolerated the procedure well and went to PACU in good condition.                                               Vikki Ports, M.D.    KRH/MEDQ  D:  04/05/2003  T:  04/05/2003  Job:  [id number]

## 2011-03-12 ENCOUNTER — Other Ambulatory Visit: Payer: Self-pay | Admitting: Dermatology

## 2011-03-12 DIAGNOSIS — C4492 Squamous cell carcinoma of skin, unspecified: Secondary | ICD-10-CM

## 2011-03-12 HISTORY — DX: Squamous cell carcinoma of skin, unspecified: C44.92

## 2011-09-04 ENCOUNTER — Other Ambulatory Visit (HOSPITAL_COMMUNITY): Payer: Self-pay | Admitting: Gastroenterology

## 2011-09-25 ENCOUNTER — Ambulatory Visit (HOSPITAL_COMMUNITY)
Admission: RE | Admit: 2011-09-25 | Discharge: 2011-09-25 | Disposition: A | Discharge status: Home / Self Care | Payer: Medicare Other | Source: Ambulatory Visit | Attending: Gastroenterology | Admitting: Gastroenterology

## 2011-09-25 DIAGNOSIS — K449 Diaphragmatic hernia without obstruction or gangrene: Secondary | ICD-10-CM | POA: Insufficient documentation

## 2011-09-25 DIAGNOSIS — R1310 Dysphagia, unspecified: Secondary | ICD-10-CM | POA: Insufficient documentation

## 2011-10-14 ENCOUNTER — Other Ambulatory Visit: Payer: Self-pay | Admitting: Gastroenterology

## 2011-10-30 ENCOUNTER — Other Ambulatory Visit: Payer: Self-pay | Admitting: Dermatology

## 2012-05-03 ENCOUNTER — Other Ambulatory Visit: Payer: Self-pay | Admitting: Dermatology

## 2012-08-24 ENCOUNTER — Other Ambulatory Visit: Payer: Self-pay | Admitting: Dermatology

## 2012-08-30 LAB — POC HEMOCCULT BLD/STL (HOME/3-CARD/SCREEN)

## 2012-08-31 ENCOUNTER — Telehealth: Payer: Self-pay | Admitting: Gastroenterology

## 2012-08-31 NOTE — Telephone Encounter (Signed)
Victor Sutton faxed notes from today's labs along with COLON/EGD reports from Dr Laural Benes who refused to see the Victor Sutton for anemia per Victor Sutton at Dr Al[facility] Fisher ofc. Victor Sutton will see Mike Gip , PA tomorrow and become Dr Lauro Franklin Victor Sutton.

## 2012-09-01 ENCOUNTER — Encounter: Payer: Self-pay | Admitting: Physician Assistant

## 2012-09-01 ENCOUNTER — Ambulatory Visit (INDEPENDENT_AMBULATORY_CARE_PROVIDER_SITE_OTHER): Payer: Medicare Other | Admitting: Physician Assistant

## 2012-09-01 ENCOUNTER — Encounter: Payer: Self-pay | Admitting: *Deleted

## 2012-09-01 VITALS — BP 124/60 | HR 90 | Ht 70.0 in | Wt 213.8 lb

## 2012-09-01 DIAGNOSIS — Z9889 Other specified postprocedural states: Secondary | ICD-10-CM

## 2012-09-01 DIAGNOSIS — D509 Iron deficiency anemia, unspecified: Secondary | ICD-10-CM

## 2012-09-01 DIAGNOSIS — Z860100 Personal history of colon polyps, unspecified: Secondary | ICD-10-CM

## 2012-09-01 DIAGNOSIS — Z9089 Acquired absence of other organs: Secondary | ICD-10-CM

## 2012-09-01 DIAGNOSIS — N400 Enlarged prostate without lower urinary tract symptoms: Secondary | ICD-10-CM

## 2012-09-01 DIAGNOSIS — Z9049 Acquired absence of other specified parts of digestive tract: Secondary | ICD-10-CM

## 2012-09-01 DIAGNOSIS — Z8719 Personal history of other diseases of the digestive system: Secondary | ICD-10-CM

## 2012-09-01 DIAGNOSIS — H3530 Unspecified macular degeneration: Secondary | ICD-10-CM

## 2012-09-01 DIAGNOSIS — E538 Deficiency of other specified B group vitamins: Secondary | ICD-10-CM

## 2012-09-01 DIAGNOSIS — Z860101 Personal history of adenomatous and serrated colon polyps: Secondary | ICD-10-CM | POA: Insufficient documentation

## 2012-09-01 MED ORDER — ESOMEPRAZOLE MAGNESIUM 40 MG PO CPDR: 40.0000 mg | DELAYED_RELEASE_CAPSULE | Freq: Every day | ORAL | Status: DC

## 2012-09-01 NOTE — Progress Notes (Addendum)
Subjective:    Patient ID: Victor Sutton, male    DOB: May 21, 1928, 78 y.o.   MRN: [id number]  HPI Victor Sutton is a pleasant 78 year old white male who is new to GI today, referred by Dr. Clelia Croft. Patient is previously known to Dr. Reece Agar and says that he was unable to be seen in a timely fashion and therefore was referred here. Patient has a new iron deficiency anemia which was found when he underwent a recent physical and had complaints of intermittent dysphagia and several month history of somewhat progressive fatigue cold intolerance and some shortness of breath with exertion. He was found to have a hemoglobin of 10.6 hematocrit of 31.2 on 08/20/2012, this is down from a prior hemoglobin of 14 a year ago. Folate level normal at 89 year Myers 18 iron saturation of 5 ferritin of 9.5 TIBC 359 to her protein electrophoresis showed a mild M. despite slightly elevated at 0.3, B12 level elevated at 957 electrolytes within normal limits total protein 5.9 Patient did complete Hemoccult cards and these were negative x3 He was started on Nu-Iron last week 150 mg once daily. Patient states that he has not noticed any melena or hematochezia over the past several months but has been having persistent fatigue and some shortness of breath with exertion over the past 3-4 months. He also mentions that he has some intermittent dysphagia which seems to be relieved by raising his arms over his head. This may happen 2-3 times weekly. He denies any ongoing heartburn or indigestion. His appetite has been fine his weight has been stable he has not noticed any changes in his bowel habits. He had been taking Advil fairly regularly over the past couple of months but hasn't had any in the past 2-3 weeks Patient underwent upper endoscopy and colonoscopy with Dr. Laural Benes in January of 2013 and we do have copies of those reports. The upper endoscopy showed a large hiatal hernia, there was no stricture and no dilation done. Duodenum  appeared normal. Colonoscopy done at that same time showed 4 sessile polyps all 5 mm or less and removed and catheter is consistent with tubular adenomas and 1 hyperplastic polyp. Patient does have a history of a paraesophageal hernia which was associated with a severe iron deficiency anemia back in 2004. He underwent paraesophageal hernia repair by Dr. Luan Pulling. He has not required any iron supplementation in the past several years .    Review of Systems  Constitutional: Positive for activity change and fatigue.  HENT: Positive for trouble swallowing.   Eyes: Negative.   Respiratory: Positive for shortness of breath.   Cardiovascular: Negative.   Gastrointestinal: Negative.   Genitourinary: Negative.   Musculoskeletal: Negative.   Skin: Positive for pallor.  Neurological: Positive for weakness.  Hematological: Negative.   Psychiatric/Behavioral: Negative.    Outpatient Encounter Prescriptions as of 09/01/2012  Medication Sig Dispense Refill  . Cholecalciferol (VITAMIN D) 2000 UNITS tablet Take 2,000 Units by mouth 2 (two) times daily.      . Coenzyme Q10 (CO Q 10) 100 MG CAPS Take by mouth. Take 1 daily .      Marland Kitchen fexofenadine (ALLEGRA ALLERGY) 180 MG tablet Take 180 mg by mouth. Take 1 tab by mouth as needed.      . iron polysaccharides (NU-IRON) 150 MG capsule Take 150 mg by mouth 2 (two) times daily.      . Multiple Vitamins-Minerals (ICAPS MV PO) Take by mouth 2 (two) times daily.      Marland Kitchen  Multiple Vitamins-Minerals (MULTIVITAMIN PO) Take 2 capsules by mouth. Daily      . Omega-3 Fatty Acids (FISH OIL) 1200 MG CAPS Take 1 capsule by mouth daily.      . solifenacin (VESICARE) 5 MG tablet Take 5 mg by mouth daily. Taje 1 tab by mouth twice daily.      . Tamsulosin HCl (FLOMAX) 0.4 MG CAPS Take 0.4 mg by mouth daily. Take 2 caps      . esomeprazole (NEXIUM) 40 MG capsule Take 1 capsule (40 mg total) by mouth daily before breakfast.  20 capsule  0  . [DISCONTINUED] diphenhydrAMINE  (BENADRYL) 25 mg capsule Take 25 mg by mouth every 6 (six) hours as needed. Take as needed.       Not on File Patient Active Problem List  Diagnosis  . Iron deficiency anemia  . S/P repair of paraesophageal hernia  . Hx of adenomatous colonic polyps  . BPH (benign prostatic hyperplasia)  . B12 deficiency  . Macular degeneration  . S/P cholecystectomy   History  Substance Use Topics  . Smoking status: Former Smoker    Quit date: 09/15/1976  . Smokeless tobacco: Never Used     Comment: Quit 1980  . Alcohol Use: No     Comment: Moderate alcohol, wine   Family History  Problem Relation Age of Onset  . Prostate cancer Paternal Grandfather   . Breast cancer Sister   . CAD Sister   . COPD Sister   . Heart attack Father         Objective:   Physical Exam  well-developed elderly white male in no acute distress, pleasant accompanied by his wife. Blood pressure 124/60 pulse 90 height 5 foot 10 weight 213. HEENT; nontraumatic normocephalic EOMI PERRLA sclera anicteric,Neck; Supple no JVD, Cardiovascular; regular rate and rhythm with S1-S2 no murmur or gallop, Pulmonary; clear bilaterally, Abdomen; soft nontender nondistended bowel sounds are active there is no palpable mass or hepatosplenomegaly. Rectal; exam not done recently did Hemoccults which were negative x3, Extremities; no clubbing, cyanosis, or edema skin warm and dry, Psych; mood and affect normal and appropriate he is somewhat hard of hearing.       Assessment & Plan:  #74 77 year old white male with new finding of iron deficiency anemia, Hemoccult negative stools and complaints of fatigue and exertional dyspnea Patient has prior history of iron deficiency anemia in 2004 associated with a paraesophageal hernia, and Cameron erosions, and underwent surgical repair of the paraesophageal hernia. Upper endoscopy done earlier this year shows patient to have a large hiatal hernia suggesting that his prior wrap has loosened. 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 #2 history of adenomatous colon polyps-up to date with colonoscopy last done in January 2013 with 3 small adenomatous polyps 5 mm or less and 1 hyperplastic polyp #3 history of B12 deficiency-current levels normal #4 BPH #5 status post cholecystectomy  Plan; patient has just started on Nu-Iron, will increase to Nu-Iron 150 mg by mouth twice daily Start Nexium 40 mg by mouth daily-samples were given to cover him for the next few weeks Advised to avoid aspirin and NSAIDs for now Schedule for upper endoscopy with Dr. Alona Bene was discussed in detail with patient and his wife and they are agreeable to proceed. Further workup pending findings of above.  We did obtain copies of his labs which were drawn on 12/18 and  hemoglobin is stable at 10.8 hematocrit 32.1 MCV of 76.9   Addendum: Reviewed and agree with initial management. Beverley Fiedler, MD

## 2012-09-01 NOTE — Patient Instructions (Addendum)
We have given you Nexium samples, Take 1 capsule by mouth daily 30 min before breakfast. Increase the Nuiron tablets to twice daily. You have been scheduled for an endoscopy with propofol. Please follow written instructions given to you at your visit today. If you use inhalers (even only as needed) or a CPAP machine, please bring them with you on the day of your procedure.

## 2012-09-02 ENCOUNTER — Encounter: Payer: Self-pay | Admitting: Internal Medicine

## 2012-09-02 ENCOUNTER — Ambulatory Visit (AMBULATORY_SURGERY_CENTER): Payer: Medicare Other | Admitting: Internal Medicine

## 2012-09-02 VITALS — BP 135/80 | HR 83 | Temp 97.4°F | Resp 21 | Ht 70.0 in | Wt 213.0 lb

## 2012-09-02 DIAGNOSIS — Z8719 Personal history of other diseases of the digestive system: Secondary | ICD-10-CM

## 2012-09-02 DIAGNOSIS — K449 Diaphragmatic hernia without obstruction or gangrene: Secondary | ICD-10-CM

## 2012-09-02 DIAGNOSIS — Z9889 Other specified postprocedural states: Secondary | ICD-10-CM

## 2012-09-02 DIAGNOSIS — D509 Iron deficiency anemia, unspecified: Secondary | ICD-10-CM

## 2012-09-02 MED ORDER — SODIUM CHLORIDE 0.9 % IV SOLN: 500.0000 mL | INTRAVENOUS | Status: DC

## 2012-09-02 NOTE — Progress Notes (Signed)
Patient did not experience any of the following events: a burn prior to discharge; a fall within the facility; wrong site/side/patient/procedure/implant event; or a hospital transfer or hospital admission upon discharge from the facility. (G8907) Patient did not have preoperative order for IV antibiotic SSI prophylaxis. (G8918)  

## 2012-09-02 NOTE — Op Note (Signed)
[facility] [address] [place], [address]   ENDOSCOPY PROCEDURE REPORT  PATIENT: Victor Sutton, Victor Sutton  MR#: [id number] BIRTHDATE: 08/08/1928 , 84  yrs. old GENDER: Male ENDOSCOPIST: Beverley Fiedler, MD REFERRED BY:  Kari Baars PROCEDURE DATE:  09/02/2012 PROCEDURE:  EGD, diagnostic ASA CLASS:     Class III INDICATIONS:  Iron deficiency anemia. MEDICATIONS: MAC sedation, administered by CRNA and Propofol (Diprivan) 130 mg IV TOPICAL ANESTHETIC: none  DESCRIPTION OF PROCEDURE: After the risks benefits and alternatives of the procedure were thoroughly explained, informed consent was obtained.  The LB [id number] endoscope was introduced through the mouth and advanced to the second portion of the duodenum. Without limitations.  The instrument was slowly withdrawn as the mucosa was fully examined.     ESOPHAGUS: The esophagus was tortuous in the distal third.   A 5-6 cm hiatal hernia was noted (see below).  STOMACH: A small non-bleeding clean-based Cameron's ulcer with surrounding edema was found in the cardia.  There were scattered Cameron's erosions at the diaphragmatic hiatus.  There was evidence of prior reflux surgery in the proximal stomach.   The mucosa of the distal stomach appeared normal.  DUODENUM: The duodenal mucosa showed no abnormalities in the bulb and second portion of the duodenum. Retroflexed views as above.     The scope was then withdrawn from the patient and the procedure completed.  COMPLICATIONS: There were no complications.  ENDOSCOPIC IMPRESSION: 1.   The esophagus was tortuous in the distal third 2.   5-6 cm hiatal hernia 3.   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   Evidence of prior hiatus hernia repair in proximal stomach 5.   The mucosa of the distal stomach appeared normal 6.   The duodenal mucosa showed no abnormalities in the bulb and second portion  of the duodenum     RECOMMENDATIONS: 1.  Continue current medications 2.  Avoid NSAIDS 3.  Referral to [facility] to discuss hiatal hernia repair (which is felt to be the reason for new iron deficiency anemia)   eSigned:  Beverley Fiedler, MD 09/02/2012 4:13 PM   CC:W.  Buren Kos, MD and The Patient  PATIENT NAME:  Victor Sutton, Victor Sutton MR#: [id number]

## 2012-09-02 NOTE — Progress Notes (Signed)
1610 a/ox3 pleased with MAC report to [facility]

## 2012-09-02 NOTE — Patient Instructions (Addendum)
Discharge instructions given with verbal understanding. Handout on a hiatal hernia given. Resume previous medications. YOU HAD AN ENDOSCOPIC PROCEDURE TODAY AT THE [facility]: Refer to the procedure report that was given to you for any specific questions about what was found during the examination.  If the procedure report does not answer your questions, please call your gastroenterologist to clarify.  If you requested that your care partner not be given the details of your procedure findings, then the procedure report has been included in a sealed envelope for you to review at your convenience later.  YOU SHOULD EXPECT: Some feelings of bloating in the abdomen. Passage of more gas than usual.  Walking can help get rid of the air that was put into your GI tract during the procedure and reduce the bloating. If you had a lower endoscopy (such as a colonoscopy or flexible sigmoidoscopy) you may notice spotting of blood in your stool or on the toilet paper. If you underwent a bowel prep for your procedure, then you may not have a normal bowel movement for a few days.  DIET: Your first meal following the procedure should be a light meal and then it is ok to progress to your normal diet.  A half-sandwich or bowl of soup is an example of a good first meal.  Heavy or fried foods are harder to digest and may make you feel nauseous or bloated.  Likewise meals heavy in dairy and vegetables can cause extra gas to form and this can also increase the bloating.  Drink plenty of fluids but you should avoid alcoholic beverages for 24 hours.  ACTIVITY: Your care partner should take you home directly after the procedure.  You should plan to take it easy, moving slowly for the rest of the day.  You can resume normal activity the day after the procedure however you should NOT DRIVE or use heavy machinery for 24 hours (because of the sedation medicines used during the test).    SYMPTOMS TO REPORT IMMEDIATELY: A  gastroenterologist can be reached at any hour.  During normal business hours, 8:30 AM to 5:00 PM Monday through Friday, call [phone].  After hours and on weekends, please call the GI answering service at [phone] who will take a message and have the physician on call contact you.   Following upper endoscopy (EGD)  Vomiting of blood or coffee ground material  New chest pain or pain under the shoulder blades  Painful or persistently difficult swallowing  New shortness of breath  Fever of 100F or higher  Black, tarry-looking stools  FOLLOW UP: If any biopsies were taken you will be contacted by phone or by letter within the next 1-3 weeks.  Call your gastroenterologist if you have not heard about the biopsies in 3 weeks.  Our staff will call the home number listed on your records the next business day following your procedure to check on you and address any questions or concerns that you may have at that time regarding the information given to you following your procedure. This is a courtesy call and so if there is no answer at the home number and we have not heard from you through the emergency physician on call, we will assume that you have returned to your regular daily activities without incident.  SIGNATURES/CONFIDENTIALITY: You and/or your care partner have signed paperwork which will be entered into your electronic medical record.  These signatures attest to the fact that that the information   above on your After Visit Summary has been reviewed and is understood.  Full responsibility of the confidentiality of this discharge information lies with you and/or your care-partner.  

## 2012-09-03 ENCOUNTER — Telehealth: Payer: Self-pay

## 2012-09-03 NOTE — Telephone Encounter (Signed)
  Follow up Call-  Call back number 09/02/2012  Post procedure Call Back phone  # [phone]  Permission to leave phone message Yes     Patient questions:  Do you have a fever, pain , or abdominal swelling? no Pain Score  0 *  Have you tolerated food without any problems? yes  Have you been able to return to your normal activities? yes  Do you have any questions about your discharge instructions: Diet   no Medications  no Follow up visit  no  Do you have questions or concerns about your Care? no  Actions: * If pain score is 4 or above: No action needed, pain <4.

## 2012-09-06 ENCOUNTER — Other Ambulatory Visit: Payer: Self-pay | Admitting: Internal Medicine

## 2012-09-06 ENCOUNTER — Telehealth: Payer: Self-pay

## 2012-09-06 ENCOUNTER — Telehealth: Payer: Self-pay | Admitting: Internal Medicine

## 2012-09-06 DIAGNOSIS — K449 Diaphragmatic hernia without obstruction or gangrene: Secondary | ICD-10-CM

## 2012-09-06 NOTE — Telephone Encounter (Signed)
Pts wife aware of appt date and time.

## 2012-09-06 NOTE — Telephone Encounter (Signed)
Message copied by Chrystie Nose on Mon Sep 06, 2012  3:11 PM ------      Message from: Marnette Burgess      Created: Mon Sep 06, 2012  3:09 PM      Regarding: RE: Referral       Patient is scheduled to see Dr. Avel Peace on 10/11/12 @ 1:30pm, arrive @ 1:00pm.  If you have any questions please call [phone].            Thank You and Have a Felton Clinton,      Elane Fritz      ----- Message -----         From: Lily Lovings, RN         Sent: 09/06/2012   3:00 PM           To: Marnette Burgess      Subject: Referral                                                 Pt needs to see Dr. Abbey Chatters for consult for Hiatal Hernia surgery.              Please let me know appt date and time.            Thanks,      Selinda Michaels

## 2012-09-06 NOTE — Telephone Encounter (Signed)
Spoke with pts wife and let her know a referral has been sent to [facility] and we are waiting to hear back regarding an appt with Dr. Abbey Chatters. Let her know we will call her with an appt as soon as we hear.

## 2012-09-16 ENCOUNTER — Other Ambulatory Visit: Payer: Self-pay | Admitting: Gastroenterology

## 2012-09-16 MED ORDER — POLYSACCHARIDE IRON COMPLEX 150 MG PO CAPS: 150.0000 mg | ORAL_CAPSULE | Freq: Two times a day (BID) | ORAL | Status: DC

## 2012-09-22 ENCOUNTER — Other Ambulatory Visit: Payer: Self-pay | Admitting: Dermatology

## 2012-10-11 ENCOUNTER — Ambulatory Visit (INDEPENDENT_AMBULATORY_CARE_PROVIDER_SITE_OTHER): Payer: Medicare Other | Admitting: General Surgery

## 2012-10-11 ENCOUNTER — Encounter (INDEPENDENT_AMBULATORY_CARE_PROVIDER_SITE_OTHER): Payer: Self-pay | Admitting: General Surgery

## 2012-10-11 VITALS — BP 150/90 | HR 84 | Temp 97.8°F | Resp 20 | Ht 70.5 in | Wt 220.6 lb

## 2012-10-11 DIAGNOSIS — K449 Diaphragmatic hernia without obstruction or gangrene: Secondary | ICD-10-CM

## 2012-10-11 NOTE — Patient Instructions (Addendum)
Call when your wife recovers from her surgery, and you are ready to schedule your surgery.  Avoid repetitive heavy lifting. Avoid overeating. Try to lose some weight.

## 2012-10-11 NOTE — Progress Notes (Signed)
Patient ID: Victor Sutton, male   DOB: 11-Mar-1928, 79 y.o.   MRN: [id number]  No chief complaint on file.   HPI Victor Sutton is a 79 y.o. male.   HPI  He is referred by Dr. Rhea Belton for evaluation of a recurrent hiatal hernia.  He underwent laparoscopic hiatal hernia repair 10 years ago. Approximately 3 years ago he began having intermittent dysphagia. An upper GI study done one year ago demonstrated a large hiatal hernia. He was having problems with weakness and noted to have anemia will. He underwent an upper endoscopy which demonstrated the hiatal hernia with some Cameron erosions present. He responded well to iron treatment. He does not have any heartburn. He occasionally has some dysphagia now. He is interested in having the hernia repaired. His wife, however, is due to have back surgery syndrome.  Past Medical History  Diagnosis Date  . BPH (benign prostatic hyperplasia)   . Macular degeneration     (L) wet, (R) dry  . B12 deficiency     HIstory  . 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 Leukoplakia 2001    Treated for  . Hypertension   . Iron deficiency anemia   . Cataract 2009    bilateral cataract surgery  . GERD (gastroesophageal reflux disease)   . Hyperlipidemia     not on medication  . Hiatal hernia   . Peripheral neuropathy     Mild History of  . Ulcer     Past Surgical History  Procedure Date  . Cholecystectomy   . Arthroscopic knee surgery     left  . Multiple oral surgeries 2002  . Tonsillectomy   . Cateract surgeries      bilateral  . Stomach surgery 2005    states his stomach had moved up toward his chest , some type of surgery performed  . Hernia repair     Laparoscopic ventral  . Hiatal hernia repair 2004    Family History  Problem Relation Age of Onset  . Prostate cancer Paternal Grandfather   . Breast cancer Sister   . CAD Sister   . COPD Sister   . Heart attack Father     Social History History  Substance Use Topics  . Smoking status:  Former Smoker    Quit date: 09/15/1976  . Smokeless tobacco: Never Used     Comment: Quit 1980  . Alcohol Use: Yes     Comment: Moderate alcohol, wine    Not on File  Current Outpatient Prescriptions  Medication Sig Dispense Refill  . Cholecalciferol (VITAMIN D) 2000 UNITS tablet Take 2,000 Units by mouth 2 (two) times daily.      . Coenzyme Q10 (CO Q 10) 100 MG CAPS Take by mouth. Take 1 daily .      . esomeprazole (NEXIUM) 40 MG capsule Take 1 capsule (40 mg total) by mouth daily before breakfast.  20 capsule  0  . fexofenadine (ALLEGRA ALLERGY) 180 MG tablet Take 180 mg by mouth. Take 1 tab by mouth as needed.      . iron polysaccharides (NU-IRON) 150 MG capsule Take 1 capsule (150 mg total) by mouth 2 (two) times daily.  60 capsule  3  . Multiple Vitamins-Minerals (ICAPS MV PO) Take by mouth 2 (two) times daily.      . Multiple Vitamins-Minerals (MULTIVITAMIN PO) Take 2 capsules by mouth. Daily      . Omega-3 Fatty Acids (FISH OIL) 1200 MG CAPS Take 1  capsule by mouth daily.      . solifenacin (VESICARE) 5 MG tablet Take 5 mg by mouth daily. Taje 1 tab by mouth twice daily.      . Tamsulosin HCl (FLOMAX) 0.4 MG CAPS Take 0.4 mg by mouth daily. Take 2 caps        Review of Systems Review of Systems  HENT: Positive for hearing loss.   Eyes: Positive for visual disturbance.  Respiratory: Negative.   Cardiovascular: Negative.   Gastrointestinal: Negative.   Skin:       Basal cell carcinoma on right chin that is due for Mohs treatment early next month.  Neurological: Positive for weakness.    Blood pressure 150/90, pulse 84, temperature 97.8 F (36.6 C), temperature source Temporal, resp. rate 20, height 5' 10.5" (1.791 m), weight 220 lb 9.6 oz (100.064 kg).  Physical Exam Physical Exam  Constitutional: No distress.       Overweight male.  HENT:  Head: Normocephalic and atraumatic.  Eyes: No scleral icterus.  Cardiovascular: Normal rate and regular rhythm.     Pulmonary/Chest: Effort normal and breath sounds normal.  Abdominal: Soft. He exhibits no distension and no mass. There is no tenderness.       Multiple small upper abdominal scars.  Musculoskeletal: He exhibits edema (trace).  Skin: Skin is warm.       Irregular skin lesion right tibia area.    Data Reviewed Upper GI. Dr. Lauro Franklin note. Endoscopy report.  Assessment    Recurrent hiatal hernia. Some anemia from the [facility] erosions.  Minimally symptomatic otherwise. No reflux.    Plan    We discussed laparoscopic possible open repair of recurrent hiatal hernia with or without mesh.    I have explained the procedure, risks, and aftercare.  Risks include but are not limited to bleeding, infection, wound problems, anesthesia, dysphagia, injury to esophagus/stomach/liver/spleen/intestine,gas bloat, recurrence.  Strict dietary restriction were explained as well as changes in lifestyle.  He/she seems to understand.  I have advised him to that his wife have her surgery first so that he can help her postoperatively. After she is more dependent, we can schedule his surgery at that time and I've asked him to call me.       Gerhard Rappaport J 10/11/2012, 2:17 PM

## 2012-10-16 HISTORY — PX: SKIN CANCER EXCISION: SHX779

## 2012-12-29 ENCOUNTER — Telehealth (INDEPENDENT_AMBULATORY_CARE_PROVIDER_SITE_OTHER): Payer: Self-pay

## 2012-12-29 NOTE — Telephone Encounter (Signed)
Pt is ready to schedule surgery.  Dr. Abbey Chatters notified, and orders will be written and sent to surgery schedulers.

## 2013-01-01 ENCOUNTER — Emergency Department (HOSPITAL_COMMUNITY): Payer: Medicare Other

## 2013-01-01 ENCOUNTER — Encounter (HOSPITAL_COMMUNITY): Payer: Self-pay | Admitting: *Deleted

## 2013-01-01 ENCOUNTER — Emergency Department (HOSPITAL_COMMUNITY)
Admission: EM | Admit: 2013-01-01 | Discharge: 2013-01-01 | Disposition: A | Discharge status: Home / Self Care | Payer: Medicare Other | Attending: Emergency Medicine | Admitting: Emergency Medicine

## 2013-01-01 DIAGNOSIS — N400 Enlarged prostate without lower urinary tract symptoms: Secondary | ICD-10-CM | POA: Insufficient documentation

## 2013-01-01 DIAGNOSIS — H269 Unspecified cataract: Secondary | ICD-10-CM | POA: Insufficient documentation

## 2013-01-01 DIAGNOSIS — I10 Essential (primary) hypertension: Secondary | ICD-10-CM | POA: Insufficient documentation

## 2013-01-01 DIAGNOSIS — Z87891 Personal history of nicotine dependence: Secondary | ICD-10-CM | POA: Insufficient documentation

## 2013-01-01 DIAGNOSIS — Z79899 Other long term (current) drug therapy: Secondary | ICD-10-CM | POA: Insufficient documentation

## 2013-01-01 DIAGNOSIS — G609 Hereditary and idiopathic neuropathy, unspecified: Secondary | ICD-10-CM | POA: Insufficient documentation

## 2013-01-01 DIAGNOSIS — H3530 Unspecified macular degeneration: Secondary | ICD-10-CM | POA: Insufficient documentation

## 2013-01-01 DIAGNOSIS — L988 Other specified disorders of the skin and subcutaneous tissue: Secondary | ICD-10-CM | POA: Insufficient documentation

## 2013-01-01 DIAGNOSIS — E538 Deficiency of other specified B group vitamins: Secondary | ICD-10-CM | POA: Insufficient documentation

## 2013-01-01 DIAGNOSIS — L98499 Non-pressure chronic ulcer of skin of other sites with unspecified severity: Secondary | ICD-10-CM | POA: Insufficient documentation

## 2013-01-01 DIAGNOSIS — G510 Bell's palsy: Secondary | ICD-10-CM | POA: Insufficient documentation

## 2013-01-01 DIAGNOSIS — F1010 Alcohol abuse, uncomplicated: Secondary | ICD-10-CM | POA: Insufficient documentation

## 2013-01-01 DIAGNOSIS — E785 Hyperlipidemia, unspecified: Secondary | ICD-10-CM | POA: Insufficient documentation

## 2013-01-01 DIAGNOSIS — K219 Gastro-esophageal reflux disease without esophagitis: Secondary | ICD-10-CM | POA: Insufficient documentation

## 2013-01-01 DIAGNOSIS — D509 Iron deficiency anemia, unspecified: Secondary | ICD-10-CM | POA: Insufficient documentation

## 2013-01-01 DIAGNOSIS — R4789 Other speech disturbances: Secondary | ICD-10-CM | POA: Insufficient documentation

## 2013-01-01 DIAGNOSIS — K449 Diaphragmatic hernia without obstruction or gangrene: Secondary | ICD-10-CM | POA: Insufficient documentation

## 2013-01-01 LAB — URINALYSIS, ROUTINE W REFLEX MICROSCOPIC
Bilirubin Urine: NEGATIVE
Glucose, UA: NEGATIVE mg/dL
Hgb urine dipstick: NEGATIVE
Ketones, ur: 40 mg/dL — AB
Leukocytes, UA: NEGATIVE
Nitrite: NEGATIVE
Protein, ur: NEGATIVE mg/dL
Specific Gravity, Urine: 1.024 (ref 1.005–1.030)
Urobilinogen, UA: 1 mg/dL (ref 0.0–1.0)
pH: 5.5 (ref 5.0–8.0)

## 2013-01-01 LAB — POCT I-STAT TROPONIN I: Troponin i, poc: 0 ng/mL (ref 0.00–0.08)

## 2013-01-01 LAB — COMPREHENSIVE METABOLIC PANEL
ALT: 16 U/L (ref 0–53)
AST: 28 U/L (ref 0–37)
Albumin: 3.4 g/dL — ABNORMAL LOW (ref 3.5–5.2)
Alkaline Phosphatase: 44 U/L (ref 39–117)
BUN: 9 mg/dL (ref 6–23)
CO2: 29 mEq/L (ref 19–32)
Calcium: 9.2 mg/dL (ref 8.4–10.5)
Chloride: 104 mEq/L (ref 96–112)
Creatinine, Ser: 0.86 mg/dL (ref 0.50–1.35)
GFR calc Af Amer: >90 mL/min (ref >90)
GFR calc non Af Amer: 79 mL/min — ABNORMAL LOW (ref >90)
Glucose, Bld: 120 mg/dL — ABNORMAL HIGH (ref 70–99)
Potassium: 3.3 mEq/L — ABNORMAL LOW (ref 3.5–5.1)
Sodium: 140 mEq/L (ref 135–145)
Total Bilirubin: 1.4 mg/dL — ABNORMAL HIGH (ref 0.3–1.2)
Total Protein: 7.2 g/dL (ref 6.0–8.3)

## 2013-01-01 LAB — RAPID URINE DRUG SCREEN, HOSP PERFORMED
Amphetamines: NOT DETECTED
Barbiturates: NOT DETECTED
Benzodiazepines: NOT DETECTED
Cocaine: NOT DETECTED
Opiates: NOT DETECTED
Tetrahydrocannabinol: NOT DETECTED

## 2013-01-01 LAB — POCT I-STAT, CHEM 8
BUN: 8 mg/dL (ref 6–23)
Calcium, Ion: 1.2 mmol/L (ref 1.13–1.30)
Chloride: 104 mEq/L (ref 96–112)
Creatinine, Ser: 0.8 mg/dL (ref 0.50–1.35)
Glucose, Bld: 122 mg/dL — ABNORMAL HIGH (ref 70–99)
HCT: 45 % (ref 39.0–52.0)
Hemoglobin: 15.3 g/dL (ref 13.0–17.0)
Potassium: 3.3 mEq/L — ABNORMAL LOW (ref 3.5–5.1)
Sodium: 143 mEq/L (ref 135–145)
TCO2: 27 mmol/L (ref 0–100)

## 2013-01-01 LAB — DIFFERENTIAL
Basophils Absolute: 0 10*3/uL (ref 0.0–0.1)
Basophils Relative: 0 % (ref 0–1)
Eosinophils Absolute: 0.2 10*3/uL (ref 0.0–0.7)
Eosinophils Relative: 3 % (ref 0–5)
Lymphocytes Relative: 24 % (ref 12–46)
Lymphs Abs: 1.4 10*3/uL (ref 0.7–4.0)
Monocytes Absolute: 0.6 10*3/uL (ref 0.1–1.0)
Monocytes Relative: 10 % (ref 3–12)
Neutro Abs: 3.8 10*3/uL (ref 1.7–7.7)
Neutrophils Relative %: 63 % (ref 43–77)

## 2013-01-01 LAB — ETHANOL: Alcohol, Ethyl (B): <11 mg/dL (ref 0–11)

## 2013-01-01 LAB — CBC
HCT: 41.5 % (ref 39.0–52.0)
Hemoglobin: 14.2 g/dL (ref 13.0–17.0)
MCH: 28 pg (ref 26.0–34.0)
MCHC: 34.2 g/dL (ref 30.0–36.0)
MCV: 81.9 fL (ref 78.0–100.0)
Platelets: 213 10*3/uL (ref 150–400)
RBC: 5.07 MIL/uL (ref 4.22–5.81)
RDW: 16.8 % — ABNORMAL HIGH (ref 11.5–15.5)
WBC: 6 10*3/uL (ref 4.0–10.5)

## 2013-01-01 LAB — TROPONIN I: Troponin I: <0.3 ng/mL (ref <0.30)

## 2013-01-01 LAB — PROTIME-INR
INR: 1.09 (ref 0.00–1.49)
Prothrombin Time: 14 seconds (ref 11.6–15.2)

## 2013-01-01 LAB — APTT: aPTT: 34 seconds (ref 24–37)

## 2013-01-01 MED ORDER — PREDNISONE 10 MG PO TABS: ORAL_TABLET | ORAL | Status: DC

## 2013-01-01 MED ORDER — ACYCLOVIR 200 MG PO CAPS
800.0000 mg | ORAL_CAPSULE | Freq: Once | ORAL | Status: DC
  Filled 2013-01-01: qty 4

## 2013-01-01 MED ORDER — PREDNISONE 20 MG PO TABS
60.0000 mg | ORAL_TABLET | Freq: Once | ORAL | Status: AC
  Administered 2013-01-01: 60 mg via ORAL
  Filled 2013-01-01: qty 3

## 2013-01-01 MED ORDER — ACYCLOVIR 800 MG PO TABS: 800.0000 mg | ORAL_TABLET | Freq: Every day | ORAL | Status: DC

## 2013-01-01 MED ORDER — ACYCLOVIR 800 MG PO TABS
800.0000 mg | ORAL_TABLET | Freq: Once | ORAL | Status: AC
  Administered 2013-01-01: 800 mg via ORAL
  Filled 2013-01-01: qty 1

## 2013-01-01 NOTE — ED Notes (Signed)
Neurologist in room with pt.  Pt and family made aware the pt is NPO, and pt provided urinal for sample

## 2013-01-01 NOTE — ED Notes (Signed)
Pt states he noticed around 1600 today that he was not able to smile.  Pt states that he has been slurring his words for over a week, but has been atributing these symptoms to a new partial plate.

## 2013-01-01 NOTE — ED Provider Notes (Signed)
History     CSN: [id number]  Arrival date & time 01/01/13  1907   First MD Initiated Contact with Patient 01/01/13 1918      Chief Complaint  Patient presents with  . Facial Droop    (Consider location/radiation/quality/duration/timing/severity/associated sxs/prior treatment) HPI Comments: Pt is an 79 year old man who has noted right facial droop today.  His wife says he has been slurring his words for about a week, which she had attributed to his having a new dental bridge.  He has had no weakness in the arms or legs, and has had no difficulty walking.  They had noticed the facial weakness this morning.  Patient is a 79 y.o. male presenting with neurologic complaint. The history is provided by the patient and the spouse. No language interpreter was used.  Neurologic Problem The primary symptoms include focal weakness. Primary symptoms do not include headaches, altered mental status, dizziness, visual change, paresthesias, memory loss or fever. Speech change: Some slurring of speech. Primary symptoms comment: He has noted right facial weakness today.. The symptoms began 6 to 12 hours ago. Episode duration: Continuous. The symptoms are unchanged. The neurological symptoms are focal. Context: No apparent precipitating factor.  Weakness began 6 - 12 hours ago. The weakness is unchanged. There is decreased muscle function with maximum physical effort.  There is weakness in these regions/motions: facial muscles. There is impairment of the following actions: closing eyes. Moving right side of face.  Associated symptoms comments: None . Associated medical issues comments: BPH, Macular degeneration, GERD. Procedure history comments: None.    Past Medical History  Diagnosis Date  . BPH (benign prostatic hyperplasia)   . Macular degeneration     (L) wet, (R) dry  . B12 deficiency     HIstory  . 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 Leukoplakia 2001    Treated for  . Hypertension   . Iron  deficiency anemia   . Cataract 2009    bilateral cataract surgery  . GERD (gastroesophageal reflux disease)   . Hyperlipidemia     not on medication  . Hiatal hernia   . Peripheral neuropathy     Mild History of  . Ulcer     Past Surgical History  Procedure Laterality Date  . Cholecystectomy    . Arthroscopic knee surgery      left  . Multiple oral surgeries  2002  . Tonsillectomy    . Cateract surgeries       bilateral  . Stomach surgery  2005    states his stomach had moved up toward his chest , some type of surgery performed  . Hernia repair      Laparoscopic ventral  . Hiatal hernia repair  2004    Family History  Problem Relation Age of Onset  . Prostate cancer Paternal Grandfather   . Breast cancer Sister   . CAD Sister   . COPD Sister   . Heart attack Father     History  Substance Use Topics  . Smoking status: Former Smoker    Quit date: 09/15/1976  . Smokeless tobacco: Never Used     Comment: Quit 1980  . Alcohol Use: Yes     Comment: Moderate alcohol, wine      Review of Systems  Constitutional: Negative.  Negative for fever and chills.  HENT: Positive for dental problem (New dental bridge.). Negative for neck pain.   Eyes: Negative.   Respiratory: Negative.   Cardiovascular: Negative.  Gastrointestinal: Negative.   Genitourinary:       Hx BPH  Musculoskeletal: Negative.   Skin: Negative.   Neurological: Positive for focal weakness and facial asymmetry. Negative for dizziness, headaches and paresthesias. Speech change: Some slurring of speech.  Psychiatric/Behavioral: Negative.  Negative for memory loss and altered mental status.    Allergies  Review of patient's allergies indicates no known allergies.  Home Medications   Current Outpatient Rx  Name  Route  Sig  Dispense  Refill  . Cholecalciferol (VITAMIN D) 2000 UNITS tablet   Oral   Take 2,000 Units by mouth 2 (two) times daily.         . Coenzyme Q10 (CO Q 10) 100 MG CAPS    Oral   Take by mouth. Take 1 daily .         . esomeprazole (NEXIUM) 40 MG capsule   Oral   Take 1 capsule (40 mg total) by mouth daily before breakfast.   20 capsule   0     Lot # [id number] Exp date: 10/2014   . fexofenadine (ALLEGRA ALLERGY) 180 MG tablet   Oral   Take 180 mg by mouth. Take 1 tab by mouth as needed.         . iron polysaccharides (NU-IRON) 150 MG capsule   Oral   Take 1 capsule (150 mg total) by mouth 2 (two) times daily.   60 capsule   3   . Multiple Vitamins-Minerals (ICAPS MV PO)   Oral   Take by mouth 2 (two) times daily.         . Multiple Vitamins-Minerals (MULTIVITAMIN PO)   Oral   Take 2 capsules by mouth. Daily         . Omega-3 Fatty Acids (FISH OIL) 1200 MG CAPS   Oral   Take 1 capsule by mouth daily.         . solifenacin (VESICARE) 5 MG tablet   Oral   Take 5 mg by mouth daily. Taje 1 tab by mouth twice daily.         . Tamsulosin HCl (FLOMAX) 0.4 MG CAPS   Oral   Take 0.4 mg by mouth daily. Take 2 caps           BP 162/90  Pulse 110  Temp(Src) 97.4 F (36.3 C) (Oral)  Resp 26  Ht 5\' 10"  (1.778 m)  Wt 220 lb (99.791 kg)  BMI 31.57 kg/m2  SpO2 93%  Physical Exam  Nursing note and vitals reviewed. Constitutional: He is oriented to person, place, and time. He appears well-developed and well-nourished. No distress.  HENT:  Head: Normocephalic and atraumatic.  Right Ear: External ear normal.  Left Ear: External ear normal.  Mouth/Throat: Oropharynx is clear and moist.  Eyes: Conjunctivae and EOM are normal. Pupils are equal, round, and reactive to light.  Neck: Normal range of motion. Neck supple.  No carotid bruit.  Cardiovascular: Normal rate, regular rhythm and normal heart sounds.   Pulmonary/Chest: Effort normal and breath sounds normal.  Abdominal: Bowel sounds are normal.  Musculoskeletal: Normal range of motion. He exhibits no edema and no tenderness.  Neurological: He is alert and oriented to person,  place, and time.  Complete minimal right facial weakness.  Otherwise, sensory and motor exam normal.   Skin: Skin is warm and dry.  Psychiatric: He has a normal mood and affect. His behavior is normal.    ED Course  Procedures (including critical care time)  Results for orders placed during the hospital encounter of 01/01/13  ETHANOL      Result Value Range   Alcohol, Ethyl (B) <11  0 - 11 mg/dL  PROTIME-INR      Result Value Range   Prothrombin Time 14.0  11.6 - 15.2 seconds   INR 1.09  0.00 - 1.49  APTT      Result Value Range   aPTT 34  24 - 37 seconds  CBC      Result Value Range   WBC 6.0  4.0 - 10.5 K/uL   RBC 5.07  4.22 - 5.81 MIL/uL   Hemoglobin 14.2  13.0 - 17.0 g/dL   HCT 16.1  09.6 - 04.5 %   MCV 81.9  78.0 - 100.0 fL   MCH 28.0  26.0 - 34.0 pg   MCHC 34.2  30.0 - 36.0 g/dL   RDW 40.9 (*) 81.1 - 91.4 %   Platelets 213  150 - 400 K/uL  DIFFERENTIAL      Result Value Range   Neutrophils Relative 63  43 - 77 %   Neutro Abs 3.8  1.7 - 7.7 K/uL   Lymphocytes Relative 24  12 - 46 %   Lymphs Abs 1.4  0.7 - 4.0 K/uL   Monocytes Relative 10  3 - 12 %   Monocytes Absolute 0.6  0.1 - 1.0 K/uL   Eosinophils Relative 3  0 - 5 %   Eosinophils Absolute 0.2  0.0 - 0.7 K/uL   Basophils Relative 0  0 - 1 %   Basophils Absolute 0.0  0.0 - 0.1 K/uL  COMPREHENSIVE METABOLIC PANEL      Result Value Range   Sodium 140  135 - 145 mEq/L   Potassium 3.3 (*) 3.5 - 5.1 mEq/L   Chloride 104  96 - 112 mEq/L   CO2 29  19 - 32 mEq/L   Glucose, Bld 120 (*) 70 - 99 mg/dL   BUN 9  6 - 23 mg/dL   Creatinine, Ser 7.82  0.50 - 1.35 mg/dL   Calcium 9.2  8.4 - 95.6 mg/dL   Total Protein 7.2  6.0 - 8.3 g/dL   Albumin 3.4 (*) 3.5 - 5.2 g/dL   AST 28  0 - 37 U/L   ALT 16  0 - 53 U/L   Alkaline Phosphatase 44  39 - 117 U/L   Total Bilirubin 1.4 (*) 0.3 - 1.2 mg/dL   GFR calc non Af Amer 79 (*) >90 mL/min   GFR calc Af Amer >90  >90 mL/min  TROPONIN I      Result Value Range   Troponin  I <0.30  <0.30 ng/mL  URINE RAPID DRUG SCREEN (HOSP PERFORMED)      Result Value Range   Opiates NONE DETECTED  NONE DETECTED   Cocaine NONE DETECTED  NONE DETECTED   Benzodiazepines NONE DETECTED  NONE DETECTED   Amphetamines NONE DETECTED  NONE DETECTED   Tetrahydrocannabinol NONE DETECTED  NONE DETECTED   Barbiturates NONE DETECTED  NONE DETECTED  URINALYSIS, ROUTINE W REFLEX MICROSCOPIC      Result Value Range   Color, Urine AMBER (*) YELLOW   APPearance CLEAR  CLEAR   Specific Gravity, Urine 1.024  1.005 - 1.030   pH 5.5  5.0 - 8.0   Glucose, UA NEGATIVE  NEGATIVE mg/dL   Hgb urine dipstick NEGATIVE  NEGATIVE   Bilirubin Urine NEGATIVE  NEGATIVE   Ketones, ur 40 (*)  NEGATIVE mg/dL   Protein, ur NEGATIVE  NEGATIVE mg/dL   Urobilinogen, UA 1.0  0.0 - 1.0 mg/dL   Nitrite NEGATIVE  NEGATIVE   Leukocytes, UA NEGATIVE  NEGATIVE  POCT I-STAT, CHEM 8      Result Value Range   Sodium 143  135 - 145 mEq/L   Potassium 3.3 (*) 3.5 - 5.1 mEq/L   Chloride 104  96 - 112 mEq/L   BUN 8  6 - 23 mg/dL   Creatinine, Ser 1.61  0.50 - 1.35 mg/dL   Glucose, Bld 096 (*) 70 - 99 mg/dL   Calcium, Ion 0.45  4.09 - 1.30 mmol/L   TCO2 27  0 - 100 mmol/L   Hemoglobin 15.3  13.0 - 17.0 g/dL   HCT 81.1  91.4 - 78.2 %  POCT I-STAT TROPONIN I      Result Value Range   Troponin i, poc 0.00  0.00 - 0.08 ng/mL   Comment 3            Ct Head Wo Contrast  01/01/2013  *RADIOLOGY REPORT*  Clinical Data: Right facial numbness around mouth inch cheek, history hypertension  CT HEAD WITHOUT CONTRAST  Technique:  Contiguous axial images were obtained from the base of the skull through the vertex without contrast.  Comparison: None  Findings: Motion artifacts, for which repeat imaging was performed. Mild generalized atrophy. Normal ventricular morphology. No midline shift or mass effect. Minimal small vessel chronic ischemic changes of deep cerebral white matter. No intracranial hemorrhage, mass lesion, or evidence  of acute infarction. No extra-axial fluid collections. Scattered mucosal thickening throughout paranasal sinuses. No acute osseous findings.  IMPRESSION: Atrophy with minimal small vessel chronic ischemic changes of deep cerebral white matter. No acute intracranial abnormalities. Scattered sinus disease changes.   Original Report Authenticated By: Ulyses Southward, M.D.     Pt's lab workup was negative.  He was seen in consultation by Noel Christmas, M.D., neurologist, who advised that he had Bell's Palsy.  Rx with acyclovir, prednisone, lacrilube.  F/U with his internist, Kari Baars, M.D.       1. Bell's palsy         Carleene Cooper III, MD 01/02/13 [phone]

## 2013-01-01 NOTE — Consult Note (Signed)
NEURO HOSPITALIST CONSULT NOTE    Reason for Consult: New-onset right facial weakness.  HPI:                                                                                                                                          Victor Sutton is an 79 y.o. male with a history of hypertension, hyperlipidemia, macular degeneration, peripheral neuropathy and vitamin B12 deficiency as well as hiatal hernia, presenting with new onset right facial weakness. Patient noticed this afternoon at his face was asymmetrical when he smiled. He has not experienced any numbness nor headache or ear pain. There has been no changes in taste on the right side of his tongue. He said no weakness of his right arm nor is right hand. There is no change in his walking. Wife indicates that his speech has been different this week because of a change in his dentures. He said no dysarthria, however.  Past Medical History  Diagnosis Date  . BPH (benign prostatic hyperplasia)   . Macular degeneration     (L) wet, (R) dry  . B12 deficiency     HIstory  . 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 Leukoplakia 2001    Treated for  . Hypertension   . Iron deficiency anemia   . Cataract 2009    bilateral cataract surgery  . GERD (gastroesophageal reflux disease)   . Hyperlipidemia     not on medication  . Hiatal hernia   . Peripheral neuropathy     Mild History of  . Ulcer     Past Surgical History  Procedure Laterality Date  . Cholecystectomy    . Arthroscopic knee surgery      left  . Multiple oral surgeries  2002  . Tonsillectomy    . Cateract surgeries       bilateral  . Stomach surgery  2005    states his stomach had moved up toward his chest , some type of surgery performed  . Hernia repair      Laparoscopic ventral  . Hiatal hernia repair  2004    Family History  Problem Relation Age of Onset  . Prostate cancer Paternal Grandfather   . Breast cancer Sister   . CAD Sister   .  COPD Sister   . Heart attack Father      Social History:  reports that he quit smoking about 36 years ago. He has never used smokeless tobacco. He reports that  drinks alcohol. He reports that he does not use illicit drugs.  No Known Allergies  MEDICATIONS:  Prior to Admission:  Vitamin D 2000 units twice a day Coenzyme Q10 one tablet daily Nexium 40 mg per day Allegra 180 mg per day New iron 150 mg twice a day Multivitamin with minerals one twice a day Omega-3 fatty acids 1200 mg per day VESIcare 5 mg per day Flomax 0.4 mg 2 per day  Blood pressure 162/90, pulse 110, temperature 97.4 F (36.3 C), temperature source Oral, resp. rate 26, height 5\' 10"  (1.778 m), weight 99.791 kg (220 lb), SpO2 93.00%.   Neurologic Examination:                                                                                                      Mental Status: Alert, oriented, thought content appropriate.  Speech fluent without evidence of aphasia. Able to follow commands without difficulty. Cranial Nerves: II-Visual fields were normal. III/IV/VI-Pupils were equal and reacted. Extraocular movements were full and conjugate.    V/VII-no facial numbness; moderate right lower facial weakness; mild weakness of right orbicularis oculi and right platysma muscles. VIII-normal. X-normal speech with no dysarthria; symmetrical palatal movement. Motor: 5/5 bilaterally with normal tone and bulk Sensory: Normal throughout. Deep Tendon Reflexes: Absent in lower extremities. Plantars: Mute bilaterally Cerebellar: Normal finger-to-nose testing.  No results found for this basename: cbc, bmp, coags, chol, tri, ldl, hga1c    Results for orders placed during the hospital encounter of 01/01/13 (from the past 48 hour(s))  PROTIME-INR     Status: None   Collection Time    01/01/13  7:35 PM      Result  Value Range   Prothrombin Time 14.0  11.6 - 15.2 seconds   INR 1.09  0.00 - 1.49  APTT     Status: None   Collection Time    01/01/13  7:35 PM      Result Value Range   aPTT 34  24 - 37 seconds  CBC     Status: Abnormal   Collection Time    01/01/13  7:35 PM      Result Value Range   WBC 6.0  4.0 - 10.5 K/uL   RBC 5.07  4.22 - 5.81 MIL/uL   Hemoglobin 14.2  13.0 - 17.0 g/dL   HCT 04.5  40.9 - 81.1 %   MCV 81.9  78.0 - 100.0 fL   MCH 28.0  26.0 - 34.0 pg   MCHC 34.2  30.0 - 36.0 g/dL   RDW 91.4 (*) 78.2 - 95.6 %   Platelets 213  150 - 400 K/uL  DIFFERENTIAL     Status: None   Collection Time    01/01/13  7:35 PM      Result Value Range   Neutrophils Relative 63  43 - 77 %   Neutro Abs 3.8  1.7 - 7.7 K/uL   Lymphocytes Relative 24  12 - 46 %   Lymphs Abs 1.4  0.7 - 4.0 K/uL   Monocytes Relative 10  3 - 12 %   Monocytes Absolute 0.6  0.1 - 1.0 K/uL   Eosinophils Relative 3  0 -  5 %   Eosinophils Absolute 0.2  0.0 - 0.7 K/uL   Basophils Relative 0  0 - 1 %   Basophils Absolute 0.0  0.0 - 0.1 K/uL  POCT I-STAT TROPONIN I     Status: None   Collection Time    01/01/13  7:54 PM      Result Value Range   Troponin i, poc 0.00  0.00 - 0.08 ng/mL   Comment 3            Comment: Due to the release kinetics of cTnI,     a negative result within the first hours     of the onset of symptoms does not rule out     myocardial infarction with certainty.     If myocardial infarction is still suspected,     repeat the test at appropriate intervals.  POCT I-STAT, CHEM 8     Status: Abnormal   Collection Time    01/01/13  7:56 PM      Result Value Range   Sodium 143  135 - 145 mEq/L   Potassium 3.3 (*) 3.5 - 5.1 mEq/L   Chloride 104  96 - 112 mEq/L   BUN 8  6 - 23 mg/dL   Creatinine, Ser 5.78  0.50 - 1.35 mg/dL   Glucose, Bld 469 (*) 70 - 99 mg/dL   Calcium, Ion 6.29  5.28 - 1.30 mmol/L   TCO2 27  0 - 100 mmol/L   Hemoglobin 15.3  13.0 - 17.0 g/dL   HCT 41.3  24.4 - 01.0 %     No results found.   Assessment/Plan: Clinical findings consistent with mild early manifestations of right Bell's palsy. There no clinical signs of acute stroke.  Recommendations: 1. Prednisone 60 mg per day for one week then taper by 10 mg per day. 2. Acyclovir 400 mg 5 times a day for 10 days. 3. Followup with primary physician in 2-3 days.  Venetia Maxon M.D. [facility] [phone]  01/01/2013, 8:28 PM

## 2013-01-11 ENCOUNTER — Ambulatory Visit (INDEPENDENT_AMBULATORY_CARE_PROVIDER_SITE_OTHER): Payer: Medicare Other | Admitting: General Surgery

## 2013-01-11 VITALS — BP 134/80 | HR 78 | Temp 98.4°F | Resp 18 | Ht 70.0 in | Wt 204.0 lb

## 2013-01-11 DIAGNOSIS — K449 Diaphragmatic hernia without obstruction or gangrene: Secondary | ICD-10-CM

## 2013-01-11 NOTE — Patient Instructions (Signed)
[facility]   EATING AFTER YOUR ESOPHAGEAL SURGERY  After your esophageal surgery, you can expect some difficulty swallowing.  If food sticks when you eat, it is called "dysphagia".  This is due to swelling around your surgery site and will most likely resolve within a few weeks.  To help you through this temporary phase, we start you out on a pureed diet.  Your first meal in the hospital was clear liquids.  You should have been given a pureed diet by the time you left the hospital.  We ask patients to stay on a pureed diet for the first two weeks to avoid anything getting "stuck" near your recent surgery.  Don't be alarmed if your ability to swallow doesn't progress according to this plan.  Everyone is different and some take longer or shorter.  Use common sense.  If you are having trouble swallowing a particular food, then avoid it.  If food is sticking when you advance your diet, go back to the previous day or two.  In general some simple rules to follow are:  Maintain an upright position (as near 90 degrees as possible) whenever eating or drinking.  Take small bites - only 1/2 to 1 teaspoon at a time.  Eat slowly.  It may also help to eat only one food at a time.  Avoid talking while eating.  Do not mix solid foods and liquids in the same mouthful and do not "wash foods down" with liquids, unless you have been instructed to do so by your surgeon.  Eat in a relaxed atmosphere, with no distractions.  Following each meal, sit in an upright position (90 degree angle) for 30 to 45 minutes.  Avoid carbonated (bubbly) drinks.  If food does stick, don't panic.  Try to relax and let the food pass on its own.  Sipping strong hot black tea can also help.  If you have any questions please call our office at [phone].   LEVEL 1 PUREED FOODS:  1ST 2 WEEKS AFTER SURGERY Foods in this group are pureed or blenderized to a smooth, mashed potato-like consistency.  If  necessary, the pureed foods can keep their shape with the addition of a thickening agent.  Meat should be pureed to a smooth pasty consistency.  Hot broth or gravy may be added to the pureed meat, approximately 1 oz. of liquid per 3 oz. serving of meat. CAUTION:  If any foods do not puree into a smooth consistency, it may make eating for swallowing more difficult.  For example, zucchini seeds sometimes do not blend well. Hot Foods Cold Foods  Pureed scrambled eggs and cheese Pureed cottage cheese  Baby cereals Thickened juices and nectars  Thinned cooked cereals (no lumps) Thickened milk or eggnog  Pureed French toast or pancakes Ensure  Mashed potatoes Ice cream  Pureed parsley, au gratin, scalloped potatoes, candied sweet potatoes Fruit or Italian ice, sherbet  Pureed buttered or alfredo noodles Plain yogurt  Pureed vegetables (no corn or peas) Instant breakfast  Pureed soups and creamed soups Smooth pudding, mousse, custard  Pureed scalloped apples Whipped gelatin  Gravies Sugar, syrup, honey, jelly  Sauces, cheese, tomato, barbecue, white, creamed Cream  Any baby food Creamer  Alcohol in moderation (not beer or champagne) Margarine  Coffee or tea Mayonnaise   Ketchup, mustard   Apple sauce   SAMPLE MENU:  PUREED DIET Breakfast Lunch Dinner   Orange juice, 1/2 cup  Cream of wheat, 1/2 cup  Pineapple   juice, 1/2 cup  Pureed turkey, barley soup, 3/4 cup  Pureed Hawaiian chicken, 3 oz   Scrambled eggs, mashed or blended with cheese, 1/2 cup  Tea or coffee, 1 cup   Whole milk, 1 cup   Non-dairy creamer, 2 Tbsp.  Mashed potatoes, 1/2 cup  Pureed cooled broccoli, 1/2 cup  Apple sauce, 1/2 cup  Coffee or tea  Mashed potatoes, 1/2 cup  Pureed spinach, 1/2 cup  Frozen yogurt, 1/2 cup  Tea or coffee    LEVEL 2 After your first 2 weeks, you can advance to a soft diet.  Keep on this diet until everything goes down easily. Hot Foods Cold Foods  White fish Cottage cheese   Stuffed fish Junior baby fruit  Baby food meals Semi thickened juices  Minced soft cooked, scrambled, poached eggs nectars  Souffle & omelets Ripe mashed bananas  Cooked cereals Canned fruit, pineapple sauce, milk  potatoes Milkshake  Buttered or Alfredo noodles Custard  Cooked cooled vegetable Puddings, including tapioca  Sherbet Yogurt  Vegetable soup or alphabet soup Fruit ice, Italian ice  Gravies Whipped gelatin  Sugar, syrup, honey, jelly Junior baby desserts  Sauces:  Cheese, creamed, barbecue, tomato, white Cream  Coffee or tea Margarine   SAMPLE MENU:  LEVEL 2 Breakfast Lunch Dinner   Orange juice, 1/2 cup  Oatmeal, 1/2 cup  Scrambled eggs with cheese, 1/2 cup  Decaffeinated tea, 1 cup  Whole milk, 1 cup  Non-dairy creamer, 2 Tbsp  Pineapple juice, 1/2 cup  Minced beef, 3 oz  Gravy, 2 Tbsp  Mashed potatoes, 1/2 cup  Minced fresh broccoli, 1/2 cup  Applesauce, 1/2 cup  Coffee, 1 cup  Turkey, barley soup, 3/4 cup  Minced Hawaiian chicken, 3 oz  Mashed potatoes, 1/2 cup  Cooked spinach, 1/2 cup  Frozen yogurt, 1/2 cup  Non-dairy creamer, 2 Tbsp    LEVEL 3 After all the foods in level 2 (soft diet) are passing through well you should advance up to the next level.  It is still important to cut these foods into small pieces and eat slowly. Hot Foods Cold Foods  Poultry Cottage cheese  Chopped Swedish meatballs Yogurt  Meat salads (ground or flaked meat) Milk  Flaked fish (tuna) Milkshakes  Poached or scrambled eggs Soft, cold, dry cereal  Souffles and omelets Fruit juices or nectars  Cooked cereals Chopped canned fruit  Chopped French toast or pancakes Canned fruit cocktail  Noodles or pasta (no rice) Pudding, mousse, custard  Cooked vegetables (no frozen peas, corn, or mixed vegetables) Green salad  Canned small sweet peas Ice cream  Creamed soup or vegetable soup Fruit ice, Italian ice  Pureed vegetable soup or alphabet soup Non-dairy  creamer  Ground scalloped apples Margarine  Gravies Mayonnaise  Sauces:  Cheese, creamed, barbecue, tomato, white Ketchup  Coffee or tea Mustard   SAMPLE MENU:  LEVEL 3 Breakfast Lunch Dinner   Orange juice, 1/2 cup  Oatmeal, 1/2 cup  Scrambled eggs with cheese, 1/2 cup  Decaffeinated tea, 1 cup  Whole milk, 1 cup  Non-dairy creamer, 2 Tbsp  Ketchup, 1 Tbsp  Margarine, 1 tsp  Salt, 1/4 tsp  Sugar, 2 tsp  Pineapple juice, 1/2 cup  Ground beef, 3 oz  Gravy, 2 Tbsp  Mashed potatoes, 1/2 cup  Cooked spinach, 1/2 cup  Applesauce, 1/2 cup  Decaffeinated coffee  Whole milk  Non-dairy creamer, 2 Tbsp  Margarine, 1 tsp  Salt, 1/4 tsp  Pureed turkey, barley soup, 3/4 cup    Barbecue chicken, 3 oz  Mashed potatoes, 1/2 cup  Ground fresh broccoli, 1/2 cup  Frozen yogurt, 1/2 cup  Decaffeinated tea, 1 cup  Non-dairy creamer, 2 Tbsp  Margarine, 1 tsp  Salt, 1/4 tsp  Sugar, 1 tsp    LEVEL 4:  REGULAR FOODS Foods in this group are soft, moist, regularly textured foods.  This level includes red meat and breads, which tend to be the hardest things to swallow.  Eat very slow, chew well and continue to avoid carbonated drinks. Hot Foods Cold Foods  Baked fish or skinned Soft cheeses - cottage cheese  Souffles and omelets Cream cheese  Eggs Yogurt  Stuffed shells Milk  Spaghetti with meat sauce Milkshakes  Cooked cereal Cold dry cereals (no nuts, dried fruit, coconut)  French toast or pancakes Crackers  Buttered toast Fruit juices or nectars  Noodles or pasta (no rice) Canned fruit  Potatoes (all types) Ripe bananas  Soft, cooked vegetables (no corn, lima, or baked beans) Peeled, ripe, fresh fruit  Creamed soups or vegetable soup Cakes (no nuts, dried fruit, coconut)  Canned chicken noodle soup Plain doughnuts  Gravies Ice cream  Bacon dressing Pudding, mousse, custard  Sauces:  Cheese, creamed, barbecue, tomato, white Fruit ice, Italian ice, sherbet   Decaffeinated tea or coffee Whipped gelatin  Pork chops Regular gelatin   Canned fruited gelatin molds   Sugar, syrup, honey, jam, jelly   Cream   Non-dairy   Margarine   Oil   Mayonnaise   Ketchup   Mustard   

## 2013-01-11 NOTE — Progress Notes (Signed)
Patient ID: Victor Sutton, male   DOB: [birth year], 79 y.o.   MRN: [id number]  Chief Complaint  Patient presents with  . Follow-up    discuss SX    HPI Victor Sutton is a 79 y.o. male.   HPI  He is here for a preoperative visit. He has a recurrent hiatal hernia and reflux. I saw him 3 months ago and we spoke about laparoscopic possible open repair. At that time, his wife D. The major surgery. She has recovered from that. He recently had Bell's palsy but is improving from this. Still having difficulty swallowing. Having some intermittent shortness of breath which is felt to be secondary to his hiatal hernia.  Past Medical History  Diagnosis Date  . BPH (benign prostatic hyperplasia)   . Macular degeneration     (L) wet, (R) dry  . B12 deficiency     HIstory  . 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 Leukoplakia 2001    Treated for  . Hypertension   . Iron deficiency anemia   . Cataract 2009    bilateral cataract surgery  . GERD (gastroesophageal reflux disease)   . Hyperlipidemia     not on medication  . Hiatal hernia   . Peripheral neuropathy     Mild History of  . Ulcer     Past Surgical History  Procedure Laterality Date  . Cholecystectomy    . Arthroscopic knee surgery      left  . Multiple oral surgeries  2002  . Tonsillectomy    . Cateract surgeries       bilateral  . Stomach surgery  2005    states his stomach had moved up toward his chest , some type of surgery performed  . Hernia repair      Laparoscopic ventral  . Hiatal hernia repair  2004    Family History  Problem Relation Age of Onset  . Prostate cancer Paternal Grandfather   . Breast cancer Sister   . CAD Sister   . COPD Sister   . Heart attack Father     Social History History  Substance Use Topics  . Smoking status: Former Smoker    Quit date: 09/15/1976  . Smokeless tobacco: Never Used     Comment: Quit 1980  . Alcohol Use: Yes     Comment: Moderate alcohol, wine    No Known  Allergies  Current Outpatient Prescriptions  Medication Sig Dispense Refill  . Multiple Vitamins-Minerals (ICAPS MV PO) Take by mouth 2 (two) times daily.      . pantoprazole (PROTONIX) 20 MG tablet Take 20 mg by mouth daily.      . pantoprazole (PROTONIX) 40 MG tablet       . solifenacin (VESICARE) 5 MG tablet Take 5 mg by mouth daily. Taje 1 tab by mouth twice daily.      . Tamsulosin HCl (FLOMAX) 0.4 MG CAPS Take 0.4 mg by mouth daily. Take 2 caps      . acyclovir (ZOVIRAX) 800 MG tablet Take 1 tablet (800 mg total) by mouth 5 (five) times daily.  35 tablet  0  . Cholecalciferol (VITAMIN D) 2000 UNITS tablet Take 2,000 Units by mouth 2 (two) times daily.      . Coenzyme Q10 (CO Q 10) 100 MG CAPS Take by mouth. Take 1 daily .      . esomeprazole (NEXIUM) 40 MG capsule Take 1 capsule (40 mg total) by mouth daily before breakfast.  20 capsule  0  . fexofenadine (ALLEGRA ALLERGY) 180 MG tablet Take 180 mg by mouth. Take 1 tab by mouth as needed.      . iron polysaccharides (NU-IRON) 150 MG capsule Take 1 capsule (150 mg total) by mouth 2 (two) times daily.  60 capsule  3  . Multiple Vitamins-Minerals (MULTIVITAMIN PO) Take 2 capsules by mouth. Daily      . Omega-3 Fatty Acids (FISH OIL) 1200 MG CAPS Take 1 capsule by mouth daily.      . predniSONE (DELTASONE) 10 MG tablet Take 3 tablets per day for 2 days, then 2 tablets per day for 2 days, then 1 tablet per day for 2 days.  15 tablet  0   No current facility-administered medications for this visit.    Review of Systems Review of Systems  HENT: Positive for trouble swallowing.   Respiratory: Positive for shortness of breath.   Neurological: Positive for facial asymmetry. Headaches: Due to Bell's palsy.    Blood pressure 134/80, pulse 78, temperature 98.4 F (36.9 C), resp. rate 18, height 5\' 10"  (1.778 m), weight 204 lb (92.534 kg).  Physical Exam Physical Exam  Constitutional: No distress.  Overweight male  HENT:  Slight right  labial droop. Slight right eyelid droop. Slight right facial droop.  Neck: Neck supple.  Cardiovascular: Normal rate and regular rhythm.   Pulmonary/Chest: Effort normal and breath sounds normal.  Abdominal: Soft. He exhibits no mass. There is no tenderness.  Multiple small upper abdominal scars.  Skin: Skin is warm and dry.    Data Reviewed Old  Assessment    Recurrent hiatal hernia and gastroesophageal reflux for laparoscopic Nissen fundoplication and hiatal hernia repair in the past. He is getting over Bell's palsy. He would like to schedule surgery.     Plan    Laparoscopic possible open repair of recurrent hiatal hernia, Nissen fundoplication, gastropexy. The procedure, aftercare, and risks were discussed with him and his wife again in detail. They seem to understand all this.        Gemma Ruan J 01/11/2013, 12:08 PM

## 2013-01-21 ENCOUNTER — Encounter (HOSPITAL_COMMUNITY): Payer: Self-pay | Admitting: Pharmacy Technician

## 2013-01-27 ENCOUNTER — Encounter (HOSPITAL_COMMUNITY): Payer: Self-pay

## 2013-01-27 ENCOUNTER — Ambulatory Visit (HOSPITAL_COMMUNITY)
Admission: RE | Admit: 2013-01-27 | Discharge: 2013-01-27 | Disposition: A | Discharge status: Home / Self Care | Payer: Medicare Other | Source: Ambulatory Visit | Attending: General Surgery | Admitting: General Surgery

## 2013-01-27 ENCOUNTER — Encounter (HOSPITAL_COMMUNITY)
Admission: RE | Admit: 2013-01-27 | Discharge: 2013-01-27 | Disposition: A | Discharge status: Home / Self Care | Payer: Medicare Other | Source: Ambulatory Visit | Attending: General Surgery | Admitting: General Surgery

## 2013-01-27 ENCOUNTER — Telehealth (INDEPENDENT_AMBULATORY_CARE_PROVIDER_SITE_OTHER): Payer: Self-pay

## 2013-01-27 DIAGNOSIS — R0602 Shortness of breath: Secondary | ICD-10-CM | POA: Insufficient documentation

## 2013-01-27 DIAGNOSIS — R05 Cough: Secondary | ICD-10-CM | POA: Insufficient documentation

## 2013-01-27 DIAGNOSIS — Z01812 Encounter for preprocedural laboratory examination: Secondary | ICD-10-CM | POA: Insufficient documentation

## 2013-01-27 DIAGNOSIS — R0989 Other specified symptoms and signs involving the circulatory and respiratory systems: Secondary | ICD-10-CM | POA: Insufficient documentation

## 2013-01-27 DIAGNOSIS — K449 Diaphragmatic hernia without obstruction or gangrene: Secondary | ICD-10-CM | POA: Insufficient documentation

## 2013-01-27 DIAGNOSIS — Z87891 Personal history of nicotine dependence: Secondary | ICD-10-CM | POA: Insufficient documentation

## 2013-01-27 DIAGNOSIS — R059 Cough, unspecified: Secondary | ICD-10-CM | POA: Insufficient documentation

## 2013-01-27 DIAGNOSIS — Z01818 Encounter for other preprocedural examination: Secondary | ICD-10-CM | POA: Insufficient documentation

## 2013-01-27 LAB — COMPREHENSIVE METABOLIC PANEL
ALT: 14 U/L (ref 0–53)
AST: 24 U/L (ref 0–37)
Albumin: 3.3 g/dL — ABNORMAL LOW (ref 3.5–5.2)
Alkaline Phosphatase: 50 U/L (ref 39–117)
BUN: 11 mg/dL (ref 6–23)
CO2: 30 mEq/L (ref 19–32)
Calcium: 9.7 mg/dL (ref 8.4–10.5)
Chloride: 104 mEq/L (ref 96–112)
Creatinine, Ser: 0.73 mg/dL (ref 0.50–1.35)
GFR calc Af Amer: >90 mL/min (ref >90)
GFR calc non Af Amer: 84 mL/min — ABNORMAL LOW (ref >90)
Glucose, Bld: 97 mg/dL (ref 70–99)
Potassium: 4.2 mEq/L (ref 3.5–5.1)
Sodium: 143 mEq/L (ref 135–145)
Total Bilirubin: 0.9 mg/dL (ref 0.3–1.2)
Total Protein: 7.3 g/dL (ref 6.0–8.3)

## 2013-01-27 LAB — CBC WITH DIFFERENTIAL/PLATELET
Basophils Absolute: 0 10*3/uL (ref 0.0–0.1)
Basophils Relative: 0 % (ref 0–1)
Eosinophils Absolute: 0.2 10*3/uL (ref 0.0–0.7)
Eosinophils Relative: 4 % (ref 0–5)
HCT: 45 % (ref 39.0–52.0)
Hemoglobin: 14.6 g/dL (ref 13.0–17.0)
Lymphocytes Relative: 21 % (ref 12–46)
Lymphs Abs: 1 10*3/uL (ref 0.7–4.0)
MCH: 27.9 pg (ref 26.0–34.0)
MCHC: 32.4 g/dL (ref 30.0–36.0)
MCV: 86 fL (ref 78.0–100.0)
Monocytes Absolute: 0.5 10*3/uL (ref 0.1–1.0)
Monocytes Relative: 10 % (ref 3–12)
Neutro Abs: 3.3 10*3/uL (ref 1.7–7.7)
Neutrophils Relative %: 66 % (ref 43–77)
Platelets: 264 10*3/uL (ref 150–400)
RBC: 5.23 MIL/uL (ref 4.22–5.81)
RDW: 16.7 % — ABNORMAL HIGH (ref 11.5–15.5)
WBC: 5.1 10*3/uL (ref 4.0–10.5)

## 2013-01-27 LAB — PROTIME-INR
INR: 1.1 (ref 0.00–1.49)
Prothrombin Time: 14.1 seconds (ref 11.6–15.2)

## 2013-01-27 LAB — SURGICAL PCR SCREEN
MRSA, PCR: NEGATIVE
Staphylococcus aureus: NEGATIVE

## 2013-01-27 NOTE — Telephone Encounter (Signed)
LMOV pt's signed handicap permit is signed and ready to be p/u at front desk.

## 2013-01-27 NOTE — Patient Instructions (Addendum)
20      Your procedure is scheduled on:  Tuesday 02/08/2013  Report to [facility] at 0700 AM.  Call this number if you have problems the morning of surgery: [phone]   Remember:             IF YOU USE CPAP,BRING MASK AND TUBING AM OF SURGERY!   Do not eat food or drink liquids AFTER MIDNIGHT!  Take these medicines the morning of surgery with A SIP OF WATER: Pantoprazole   Do not bring valuables to the hospital.  .  Leave suitcase in the car. After surgery it may be brought to your room.  For patients admitted to the hospital, checkout time is 11:00 AM the day of              Discharge.    DO NOT WEAR JEWELRY , MAKE-UP, LOTIONS,POWDERS,PERFUMES!             WOMEN -DO NOT SHAVE LEGS OR UNDERARMS 12 HRS. BEFORE SURGERY!               MEN MAY SHAVE AS USUAL!             CONTACTS,DENTURES OR BRIDGEWORK, FALSE EYELASHES MAY  NOT BE WORN INTO SURGERY!                                           Patients discharged the day of surgery will not be allowed to drive home. If going home the same day of surgery, must have someone stay with you  first 24 hrs.at home and arrange for someone to drive you home from the Hospital.                           YOUR DRIVER XB:[id number]   Special Instructions:             Please read over the following fact sheets that you were given:             1. [facility] PREPARING FOR SURGERY SHEET              2.[facility] INFORMATION              3.[facility]                                        Telford Nab.Darcell Sabino,RN,BSN     [phone]                FAILURE TO FOLLOW THESE INSTRUCTIONS MAY RESULT IN  CANCELLATION OF YOUR SURGERY!               Patient Signature:___________________________

## 2013-02-08 ENCOUNTER — Inpatient Hospital Stay (HOSPITAL_COMMUNITY)
Admission: RE | Admit: 2013-02-08 | Discharge: 2013-02-24 | DRG: 326 | Disposition: A | Discharge status: Home Health | Payer: Medicare Other | Source: Ambulatory Visit | Attending: General Surgery | Admitting: General Surgery

## 2013-02-08 ENCOUNTER — Inpatient Hospital Stay (HOSPITAL_COMMUNITY): Payer: Medicare Other

## 2013-02-08 ENCOUNTER — Encounter (HOSPITAL_COMMUNITY): Payer: Self-pay | Admitting: Anesthesiology

## 2013-02-08 ENCOUNTER — Ambulatory Visit (HOSPITAL_COMMUNITY): Payer: Medicare Other | Admitting: Anesthesiology

## 2013-02-08 ENCOUNTER — Encounter (HOSPITAL_COMMUNITY)
Admission: RE | Disposition: A | Discharge status: Home Health | Payer: Self-pay | Source: Ambulatory Visit | Attending: General Surgery

## 2013-02-08 ENCOUNTER — Encounter (HOSPITAL_COMMUNITY): Payer: Self-pay | Admitting: *Deleted

## 2013-02-08 DIAGNOSIS — N400 Enlarged prostate without lower urinary tract symptoms: Secondary | ICD-10-CM

## 2013-02-08 DIAGNOSIS — K660 Peritoneal adhesions (postprocedural) (postinfection): Secondary | ICD-10-CM | POA: Diagnosis present

## 2013-02-08 DIAGNOSIS — J1569 Pneumonia due to other gram-negative bacteria: Secondary | ICD-10-CM | POA: Diagnosis not present

## 2013-02-08 DIAGNOSIS — E785 Hyperlipidemia, unspecified: Secondary | ICD-10-CM | POA: Diagnosis present

## 2013-02-08 DIAGNOSIS — G510 Bell's palsy: Secondary | ICD-10-CM

## 2013-02-08 DIAGNOSIS — I2489 Other forms of acute ischemic heart disease: Secondary | ICD-10-CM | POA: Diagnosis present

## 2013-02-08 DIAGNOSIS — R5381 Other malaise: Secondary | ICD-10-CM | POA: Diagnosis present

## 2013-02-08 DIAGNOSIS — J156 Pneumonia due to other aerobic Gram-negative bacteria: Secondary | ICD-10-CM | POA: Diagnosis not present

## 2013-02-08 DIAGNOSIS — K219 Gastro-esophageal reflux disease without esophagitis: Secondary | ICD-10-CM

## 2013-02-08 DIAGNOSIS — R1312 Dysphagia, oropharyngeal phase: Secondary | ICD-10-CM | POA: Diagnosis present

## 2013-02-08 DIAGNOSIS — Z86010 Personal history of colonic polyps: Secondary | ICD-10-CM

## 2013-02-08 DIAGNOSIS — J95821 Acute postprocedural respiratory failure: Secondary | ICD-10-CM | POA: Diagnosis not present

## 2013-02-08 DIAGNOSIS — H3530 Unspecified macular degeneration: Secondary | ICD-10-CM

## 2013-02-08 DIAGNOSIS — Z6827 Body mass index (BMI) 27.0-27.9, adult: Secondary | ICD-10-CM

## 2013-02-08 DIAGNOSIS — K449 Diaphragmatic hernia without obstruction or gangrene: Principal | ICD-10-CM

## 2013-02-08 DIAGNOSIS — D509 Iron deficiency anemia, unspecified: Secondary | ICD-10-CM

## 2013-02-08 DIAGNOSIS — I4891 Unspecified atrial fibrillation: Secondary | ICD-10-CM

## 2013-02-08 DIAGNOSIS — Z9049 Acquired absence of other specified parts of digestive tract: Secondary | ICD-10-CM

## 2013-02-08 DIAGNOSIS — E538 Deficiency of other specified B group vitamins: Secondary | ICD-10-CM

## 2013-02-08 DIAGNOSIS — I248 Other forms of acute ischemic heart disease: Secondary | ICD-10-CM | POA: Diagnosis present

## 2013-02-08 DIAGNOSIS — E876 Hypokalemia: Secondary | ICD-10-CM | POA: Diagnosis present

## 2013-02-08 DIAGNOSIS — I4892 Unspecified atrial flutter: Secondary | ICD-10-CM | POA: Diagnosis present

## 2013-02-08 DIAGNOSIS — Z79899 Other long term (current) drug therapy: Secondary | ICD-10-CM

## 2013-02-08 DIAGNOSIS — T17908A Unspecified foreign body in respiratory tract, part unspecified causing other injury, initial encounter: Secondary | ICD-10-CM

## 2013-02-08 DIAGNOSIS — E43 Unspecified severe protein-calorie malnutrition: Secondary | ICD-10-CM

## 2013-02-08 DIAGNOSIS — Y838 Other surgical procedures as the cause of abnormal reaction of the patient, or of later complication, without mention of misadventure at the time of the procedure: Secondary | ICD-10-CM | POA: Diagnosis present

## 2013-02-08 DIAGNOSIS — J9819 Other pulmonary collapse: Secondary | ICD-10-CM | POA: Diagnosis not present

## 2013-02-08 DIAGNOSIS — Z8719 Personal history of other diseases of the digestive system: Secondary | ICD-10-CM

## 2013-02-08 DIAGNOSIS — J954 Chemical pneumonitis due to anesthesia: Secondary | ICD-10-CM | POA: Diagnosis not present

## 2013-02-08 DIAGNOSIS — J9600 Acute respiratory failure, unspecified whether with hypoxia or hypercapnia: Secondary | ICD-10-CM

## 2013-02-08 DIAGNOSIS — B49 Unspecified mycosis: Secondary | ICD-10-CM | POA: Diagnosis present

## 2013-02-08 DIAGNOSIS — Z860101 Personal history of adenomatous and serrated colon polyps: Secondary | ICD-10-CM

## 2013-02-08 DIAGNOSIS — R7309 Other abnormal glucose: Secondary | ICD-10-CM | POA: Diagnosis present

## 2013-02-08 HISTORY — PX: UPPER GI ENDOSCOPY: SHX6162

## 2013-02-08 HISTORY — PX: LAPAROSCOPIC NISSEN FUNDOPLICATION: SHX1932

## 2013-02-08 HISTORY — PX: LAPAROSCOPIC LYSIS OF ADHESIONS: SHX5905

## 2013-02-08 HISTORY — PX: HIATAL HERNIA REPAIR: SHX195

## 2013-02-08 LAB — BASIC METABOLIC PANEL
BUN: 13 mg/dL (ref 6–23)
CO2: 22 mEq/L (ref 19–32)
Calcium: 9.1 mg/dL (ref 8.4–10.5)
Chloride: 101 mEq/L (ref 96–112)
Creatinine, Ser: 0.67 mg/dL (ref 0.50–1.35)
GFR calc Af Amer: >90 mL/min (ref >90)
GFR calc non Af Amer: 87 mL/min — ABNORMAL LOW (ref >90)
Glucose, Bld: 204 mg/dL — ABNORMAL HIGH (ref 70–99)
Potassium: 3.3 mEq/L — ABNORMAL LOW (ref 3.5–5.1)
Sodium: 142 mEq/L (ref 135–145)

## 2013-02-08 LAB — BLOOD GAS, ARTERIAL
Acid-base deficit: 1.1 mmol/L (ref 0.0–2.0)
Bicarbonate: 22.5 mEq/L (ref 20.0–24.0)
Drawn by: 24480
FIO2: 0.4 %
MECHVT: 580 mL
O2 Saturation: 95.7 %
PEEP: 5 cmH2O
Patient temperature: 37
RATE: 14 resp/min
TCO2: 19.8 mmol/L (ref 0–100)
pCO2 arterial: 36 mmHg (ref 35.0–45.0)
pH, Arterial: 7.412 (ref 7.350–7.450)
pO2, Arterial: 77.5 mmHg — ABNORMAL LOW (ref 80.0–100.0)

## 2013-02-08 LAB — CBC
HCT: 42.2 % (ref 39.0–52.0)
Hemoglobin: 13.7 g/dL (ref 13.0–17.0)
MCH: 28.4 pg (ref 26.0–34.0)
MCHC: 32.5 g/dL (ref 30.0–36.0)
MCV: 87.6 fL (ref 78.0–100.0)
Platelets: 282 10*3/uL (ref 150–400)
RBC: 4.82 MIL/uL (ref 4.22–5.81)
RDW: 16.7 % — ABNORMAL HIGH (ref 11.5–15.5)
WBC: 15.9 10*3/uL — ABNORMAL HIGH (ref 4.0–10.5)

## 2013-02-08 LAB — TYPE AND SCREEN
ABO/RH(D): A POS
Antibody Screen: NEGATIVE

## 2013-02-08 LAB — ABO/RH: ABO/RH(D): A POS

## 2013-02-08 LAB — PHOSPHORUS: Phosphorus: 3.4 mg/dL (ref 2.3–4.6)

## 2013-02-08 LAB — TROPONIN I
Troponin I: <0.3 ng/mL (ref <0.30)
Troponin I: <0.3 ng/mL (ref <0.30)

## 2013-02-08 LAB — MAGNESIUM: Magnesium: 1.8 mg/dL (ref 1.5–2.5)

## 2013-02-08 SURGERY — REPAIR, HERNIA, HIATAL, LAPAROSCOPIC
Anesthesia: General | Site: Esophagus | Wound class: Clean

## 2013-02-08 MED ORDER — FENTANYL BOLUS VIA INFUSION
25.0000 ug | Freq: Four times a day (QID) | INTRAVENOUS | Status: DC | PRN
  Filled 2013-02-08: qty 100

## 2013-02-08 MED ORDER — MORPHINE SULFATE 2 MG/ML IJ SOLN
1.0000 mg | INTRAMUSCULAR | Status: DC | PRN
  Administered 2013-02-09 (×2): 2 mg via INTRAVENOUS
  Filled 2013-02-08 (×2): qty 1

## 2013-02-08 MED ORDER — SUCCINYLCHOLINE CHLORIDE 20 MG/ML IJ SOLN
INTRAMUSCULAR | Status: DC | PRN
  Administered 2013-02-08: 100 mg via INTRAVENOUS

## 2013-02-08 MED ORDER — LACTATED RINGERS IV SOLN
INTRAVENOUS | Status: DC | PRN
  Administered 2013-02-08 (×3): via INTRAVENOUS

## 2013-02-08 MED ORDER — BUPIVACAINE HCL (PF) 0.5 % IJ SOLN
INTRAMUSCULAR | Status: DC | PRN
  Administered 2013-02-08: 20 mL

## 2013-02-08 MED ORDER — ADENOSINE 12 MG/4ML IV SOLN
12.0000 mg | Freq: Once | INTRAVENOUS | Status: DC | PRN
  Filled 2013-02-08: qty 4

## 2013-02-08 MED ORDER — AMIODARONE HCL IN DEXTROSE 360-4.14 MG/200ML-% IV SOLN
60.0000 mg/h | INTRAVENOUS | Status: DC
  Administered 2013-02-08: 60 mg/h via INTRAVENOUS
  Filled 2013-02-08: qty 200

## 2013-02-08 MED ORDER — FENTANYL CITRATE 0.05 MG/ML IJ SOLN
25.0000 ug/h | INTRAMUSCULAR | Status: DC
  Administered 2013-02-08: 50 ug/h via INTRAVENOUS
  Administered 2013-02-08: 100 ug/h via INTRAVENOUS
  Administered 2013-02-08: 125 ug/h via INTRAVENOUS
  Filled 2013-02-08: qty 50

## 2013-02-08 MED ORDER — FENTANYL CITRATE 0.05 MG/ML IJ SOLN
INTRAMUSCULAR | Status: AC
  Filled 2013-02-08: qty 2

## 2013-02-08 MED ORDER — PHENYLEPHRINE HCL 10 MG/ML IJ SOLN
INTRAMUSCULAR | Status: DC | PRN
  Administered 2013-02-08 (×3): 80 ug via INTRAVENOUS

## 2013-02-08 MED ORDER — NEOSTIGMINE METHYLSULFATE 1 MG/ML IJ SOLN
INTRAMUSCULAR | Status: DC | PRN
  Administered 2013-02-08: 4 mg via INTRAVENOUS

## 2013-02-08 MED ORDER — MUPIROCIN 2 % EX OINT
1.0000 "application " | TOPICAL_OINTMENT | Freq: Two times a day (BID) | CUTANEOUS | Status: DC
  Administered 2013-02-08 – 2013-02-09 (×3): 1 via TOPICAL
  Filled 2013-02-08: qty 22

## 2013-02-08 MED ORDER — ACETAMINOPHEN 10 MG/ML IV SOLN
INTRAVENOUS | Status: AC
  Filled 2013-02-08: qty 100

## 2013-02-08 MED ORDER — MIDAZOLAM HCL 2 MG/2ML IJ SOLN
INTRAMUSCULAR | Status: AC
  Filled 2013-02-08: qty 2

## 2013-02-08 MED ORDER — GLYCOPYRROLATE 0.2 MG/ML IJ SOLN
INTRAMUSCULAR | Status: DC | PRN
  Administered 2013-02-08: 0.6 mg via INTRAVENOUS

## 2013-02-08 MED ORDER — KCL IN DEXTROSE-NACL 20-5-0.9 MEQ/L-%-% IV SOLN: INTRAVENOUS | Status: DC

## 2013-02-08 MED ORDER — ACETAMINOPHEN 10 MG/ML IV SOLN
INTRAVENOUS | Status: DC | PRN
  Administered 2013-02-08: 1000 mg via INTRAVENOUS

## 2013-02-08 MED ORDER — DILTIAZEM LOAD VIA INFUSION
10.0000 mg | Freq: Once | INTRAVENOUS | Status: AC
  Administered 2013-02-08: 10 mg via INTRAVENOUS
  Filled 2013-02-08: qty 10

## 2013-02-08 MED ORDER — CEFAZOLIN SODIUM-DEXTROSE 2-3 GM-% IV SOLR
INTRAVENOUS | Status: AC
  Filled 2013-02-08: qty 50

## 2013-02-08 MED ORDER — PROPOFOL 10 MG/ML IV BOLUS
INTRAVENOUS | Status: DC | PRN
  Administered 2013-02-08: 150 mg via INTRAVENOUS
  Administered 2013-02-08: 30 mg

## 2013-02-08 MED ORDER — PANTOPRAZOLE SODIUM 40 MG IV SOLR: 40.0000 mg | INTRAVENOUS | Status: DC

## 2013-02-08 MED ORDER — BUPIVACAINE-EPINEPHRINE (PF) 0.5% -1:200000 IJ SOLN
INTRAMUSCULAR | Status: AC
  Filled 2013-02-08: qty 10

## 2013-02-08 MED ORDER — CISATRACURIUM BESYLATE (PF) 10 MG/5ML IV SOLN
INTRAVENOUS | Status: DC | PRN
  Administered 2013-02-08 (×2): 2 mg via INTRAVENOUS
  Administered 2013-02-08: 6 mg via INTRAVENOUS
  Administered 2013-02-08 (×2): 2 mg via INTRAVENOUS

## 2013-02-08 MED ORDER — ADENOSINE 6 MG/2ML IV SOLN
6.0000 mg | Freq: Once | INTRAVENOUS | Status: AC
  Administered 2013-02-08: 6 mg via INTRAVENOUS
  Filled 2013-02-08: qty 2

## 2013-02-08 MED ORDER — CEFAZOLIN SODIUM 1-5 GM-% IV SOLN
1.0000 g | Freq: Four times a day (QID) | INTRAVENOUS | Status: AC
  Administered 2013-02-08 – 2013-02-09 (×3): 1 g via INTRAVENOUS
  Filled 2013-02-08 (×4): qty 50

## 2013-02-08 MED ORDER — FENTANYL CITRATE 0.05 MG/ML IJ SOLN
25.0000 ug | INTRAMUSCULAR | Status: DC | PRN
  Administered 2013-02-08 (×2): 50 ug via INTRAVENOUS

## 2013-02-08 MED ORDER — MIDAZOLAM HCL 2 MG/2ML IJ SOLN
0.5000 mg | Freq: Once | INTRAMUSCULAR | Status: AC | PRN
  Administered 2013-02-08: 2 mg via INTRAVENOUS

## 2013-02-08 MED ORDER — ONDANSETRON HCL 4 MG/2ML IJ SOLN
4.0000 mg | INTRAMUSCULAR | Status: AC
  Administered 2013-02-08 – 2013-02-09 (×4): 4 mg via INTRAVENOUS
  Filled 2013-02-08 (×3): qty 2

## 2013-02-08 MED ORDER — PROMETHAZINE HCL 25 MG/ML IJ SOLN: 6.2500 mg | INTRAMUSCULAR | Status: DC | PRN

## 2013-02-08 MED ORDER — FLUMAZENIL 0.5 MG/5ML IV SOLN
0.2000 mg | Freq: Once | INTRAVENOUS | Status: AC
  Administered 2013-02-08: 0.2 mg via INTRAVENOUS

## 2013-02-08 MED ORDER — DEXTROSE-NACL 5-0.45 % IV SOLN
INTRAVENOUS | Status: DC
  Administered 2013-02-08 – 2013-02-11 (×3): via INTRAVENOUS

## 2013-02-08 MED ORDER — DILTIAZEM HCL 100 MG IV SOLR
10.0000 mg/h | INTRAVENOUS | Status: DC
  Administered 2013-02-08: 15 mg/h via INTRAVENOUS
  Administered 2013-02-08: 10 mg/h via INTRAVENOUS
  Filled 2013-02-08: qty 100

## 2013-02-08 MED ORDER — NALOXONE HCL 0.4 MG/ML IJ SOLN
INTRAMUSCULAR | Status: AC
  Administered 2013-02-08: 0.08 mg
  Administered 2013-02-08: 0.04 mg
  Filled 2013-02-08: qty 1

## 2013-02-08 MED ORDER — AMIODARONE HCL IN DEXTROSE 360-4.14 MG/200ML-% IV SOLN: 30.0000 mg/h | INTRAVENOUS | Status: DC

## 2013-02-08 MED ORDER — ONDANSETRON HCL 4 MG/2ML IJ SOLN
INTRAMUSCULAR | Status: DC | PRN
  Administered 2013-02-08: 4 mg via INTRAVENOUS

## 2013-02-08 MED ORDER — ADENOSINE 6 MG/2ML IV SOLN
INTRAVENOUS | Status: AC
  Filled 2013-02-08: qty 2

## 2013-02-08 MED ORDER — PROPOFOL 10 MG/ML IV EMUL
5.0000 ug/kg/min | INTRAVENOUS | Status: DC
  Administered 2013-02-08: 25 ug/kg/min via INTRAVENOUS
  Administered 2013-02-08: 15 ug/kg/min via INTRAVENOUS
  Administered 2013-02-08: 20 ug/kg/min via INTRAVENOUS
  Filled 2013-02-08 (×2): qty 100

## 2013-02-08 MED ORDER — POLYVINYL ALCOHOL 1.4 % OP SOLN
1.0000 [drp] | OPHTHALMIC | Status: DC | PRN
  Administered 2013-02-09 (×2): 1 [drp] via OPHTHALMIC
  Filled 2013-02-08: qty 15

## 2013-02-08 MED ORDER — BUPIVACAINE HCL (PF) 0.5 % IJ SOLN
INTRAMUSCULAR | Status: AC
  Filled 2013-02-08: qty 30

## 2013-02-08 MED ORDER — CEFAZOLIN SODIUM-DEXTROSE 2-3 GM-% IV SOLR
2.0000 g | INTRAVENOUS | Status: AC
  Administered 2013-02-08: 2 g via INTRAVENOUS

## 2013-02-08 MED ORDER — AMIODARONE HCL IN DEXTROSE 360-4.14 MG/200ML-% IV SOLN
60.0000 mg/h | INTRAVENOUS | Status: AC
  Administered 2013-02-08: 60 mg/h via INTRAVENOUS
  Filled 2013-02-08: qty 200

## 2013-02-08 MED ORDER — PANTOPRAZOLE SODIUM 40 MG IV SOLR
40.0000 mg | Freq: Every day | INTRAVENOUS | Status: DC
  Administered 2013-02-08 – 2013-02-22 (×15): 40 mg via INTRAVENOUS
  Filled 2013-02-08 (×17): qty 40

## 2013-02-08 MED ORDER — ONDANSETRON HCL 4 MG/2ML IJ SOLN
4.0000 mg | INTRAMUSCULAR | Status: DC | PRN
  Administered 2013-02-11: 4 mg via INTRAVENOUS
  Filled 2013-02-08: qty 2

## 2013-02-08 MED ORDER — AMIODARONE LOAD VIA INFUSION
150.0000 mg | Freq: Once | INTRAVENOUS | Status: AC
  Administered 2013-02-08: 150 mg via INTRAVENOUS
  Filled 2013-02-08: qty 83.34

## 2013-02-08 MED ORDER — DEXAMETHASONE SODIUM PHOSPHATE 10 MG/ML IJ SOLN
INTRAMUSCULAR | Status: DC | PRN
  Administered 2013-02-08: 8 mg via INTRAVENOUS

## 2013-02-08 MED ORDER — LACTATED RINGERS IV SOLN
INTRAVENOUS | Status: DC
  Administered 2013-02-08: 14:00:00 via INTRAVENOUS

## 2013-02-08 MED ORDER — DILTIAZEM HCL 100 MG IV SOLR
5.0000 mg/h | INTRAVENOUS | Status: DC
  Administered 2013-02-08: 10 mg/h via INTRAVENOUS

## 2013-02-08 MED ORDER — LACTATED RINGERS IR SOLN
Status: DC | PRN
  Administered 2013-02-08: 1000 mL

## 2013-02-08 MED ORDER — AMIODARONE HCL IN DEXTROSE 360-4.14 MG/200ML-% IV SOLN
30.0000 mg/h | INTRAVENOUS | Status: DC
  Filled 2013-02-08 (×3): qty 200

## 2013-02-08 MED ORDER — FENTANYL CITRATE 0.05 MG/ML IJ SOLN
INTRAMUSCULAR | Status: DC | PRN
  Administered 2013-02-08 (×6): 50 ug via INTRAVENOUS

## 2013-02-08 SURGICAL SUPPLY — 55 items
APPLIER CLIP ROT 10 11.4 M/L (STAPLE)
BENZOIN TINCTURE PRP APPL 2/3 (GAUZE/BANDAGES/DRESSINGS) ×4 IMPLANT
CANISTER SUCTION 2500CC (MISCELLANEOUS) ×4 IMPLANT
CANNULA ENDOPATH XCEL 11M (ENDOMECHANICALS) ×12 IMPLANT
CLAMP ENDO BABCK 10MM (STAPLE) ×8 IMPLANT
CLIP APPLIE ROT 10 11.4 M/L (STAPLE) IMPLANT
CLOTH BEACON ORANGE TIMEOUT ST (SAFETY) ×4 IMPLANT
DECANTER SPIKE VIAL GLASS SM (MISCELLANEOUS) IMPLANT
DEVICE SUT QUICK LOAD TK 5 (STAPLE) ×24 IMPLANT
DEVICE SUT TI-KNOT TK 5X26 (MISCELLANEOUS) ×4 IMPLANT
DEVICE SUTURE ENDOST 10MM (ENDOMECHANICALS) ×4 IMPLANT
DISSECTOR BLUNT TIP ENDO 5MM (MISCELLANEOUS) ×4 IMPLANT
DRAIN PENROSE 18X1/2 LTX STRL (DRAIN) ×4 IMPLANT
DRAPE LAPAROSCOPIC ABDOMINAL (DRAPES) ×4 IMPLANT
DRSG TEGADERM 4X4.75 (GAUZE/BANDAGES/DRESSINGS) ×24 IMPLANT
ELECT REM PT RETURN 9FT ADLT (ELECTROSURGICAL) ×4
ELECTRODE REM PT RTRN 9FT ADLT (ELECTROSURGICAL) ×3 IMPLANT
FELT TEFLON 4 X1 (Mesh General) ×4 IMPLANT
FILTER SMOKE EVAC LAPAROSHD (FILTER) ×4 IMPLANT
GLOVE BIOGEL PI IND STRL 7.0 (GLOVE) ×12 IMPLANT
GLOVE BIOGEL PI IND STRL 7.5 (GLOVE) ×3 IMPLANT
GLOVE BIOGEL PI INDICATOR 7.0 (GLOVE) ×4
GLOVE BIOGEL PI INDICATOR 7.5 (GLOVE) ×1
GLOVE ECLIPSE 6.5 STRL STRAW (GLOVE) ×4 IMPLANT
GLOVE ECLIPSE 8.0 STRL XLNG CF (GLOVE) ×4 IMPLANT
GLOVE INDICATOR 8.0 STRL GRN (GLOVE) ×8 IMPLANT
GOWN STRL NON-REIN LRG LVL3 (GOWN DISPOSABLE) ×8 IMPLANT
GOWN STRL REIN XL XLG (GOWN DISPOSABLE) ×20 IMPLANT
GRASPER ENDO BABCOCK 10 (MISCELLANEOUS) IMPLANT
GRASPER ENDO BABCOCK 10MM (MISCELLANEOUS)
KIT BASIN OR (CUSTOM PROCEDURE TRAY) ×4 IMPLANT
NS IRRIG 1000ML POUR BTL (IV SOLUTION) ×4 IMPLANT
PENCIL BUTTON HOLSTER BLD 10FT (ELECTRODE) IMPLANT
RETRACTOR LAPSCP 12X46 CVD (ENDOMECHANICALS) IMPLANT
RTRCTR LAPSCP 12X46 CVD (ENDOMECHANICALS)
SCALPEL HARMONIC ACE (MISCELLANEOUS) ×4 IMPLANT
SCISSORS LAP 5X35 DISP (ENDOMECHANICALS) ×4 IMPLANT
SET IRRIG TUBING LAPAROSCOPIC (IRRIGATION / IRRIGATOR) ×12 IMPLANT
SOLUTION ANTI FOG 6CC (MISCELLANEOUS) ×4 IMPLANT
STAPLER VISISTAT 35W (STAPLE) ×4 IMPLANT
STRIP CLOSURE SKIN 1/2X4 (GAUZE/BANDAGES/DRESSINGS) ×4 IMPLANT
SUT MNCRL AB 4-0 PS2 18 (SUTURE) ×4 IMPLANT
SUT SURGIDAC NAB ES-9 0 48 120 (SUTURE) ×28 IMPLANT
TIP INNERVISION DETACH 40FR (MISCELLANEOUS) IMPLANT
TIP INNERVISION DETACH 50FR (MISCELLANEOUS) IMPLANT
TIP INNERVISION DETACH 56FR (MISCELLANEOUS) ×4 IMPLANT
TIPS INNERVISION DETACH 40FR (MISCELLANEOUS)
TOWEL OR 17X26 10 PK STRL BLUE (TOWEL DISPOSABLE) ×4 IMPLANT
TRAY FOLEY CATH 14FRSI W/METER (CATHETERS) ×4 IMPLANT
TRAY LAP CHOLE (CUSTOM PROCEDURE TRAY) ×4 IMPLANT
TROCAR BLADELESS OPT 5 75 (ENDOMECHANICALS) ×4 IMPLANT
TROCAR XCEL 12X100 BLDLESS (ENDOMECHANICALS) IMPLANT
TROCAR XCEL NON-BLD 11X100MML (ENDOMECHANICALS) ×12 IMPLANT
TROCAR XCEL UNIV SLVE 11M 100M (ENDOMECHANICALS) ×4 IMPLANT
TUBING INSUFFLATION 10FT LAP (TUBING) ×4 IMPLANT

## 2013-02-08 NOTE — Interval H&P Note (Signed)
History and Physical Interval Note:  02/08/2013 9:29 AM  Victor Sutton  has presented today for surgery, with the diagnosis of hiatal hernia recurrent  The various methods of treatment have been discussed with the patient and family. After consideration of risks, benefits and other options for treatment, the patient has consented to  Procedure(s): LAPAROSCOPIC, POSSIBLE OPEN REPAIR OF RECURRENT  HIATAL HERNIA (N/A) as a surgical intervention .  The patient's history has been reviewed, patient examined, no change in status, stable for surgery.  I have reviewed the patient's chart and labs.  Questions were answered to the patient's satisfaction.     Alana Dayton Shela Commons

## 2013-02-08 NOTE — Transfer of Care (Signed)
Immediate Anesthesia Transfer of Care Note  Patient: Victor Sutton  Procedure(s) Performed: Procedure(s):  REPAIR OFCURRENT HIATAL HERNIA,  (N/A) UPPER GI ENDOSCOPY (N/A) LAPAROSCOPIC LYSIS OF ADHESIONS (N/A) LAPAROSCOPIC NISSEN FUNDOPLICATION  Patient Location: PACU  Anesthesia Type:General  Level of Consciousness: awake, sedated and patient cooperative  Airway & Oxygen Therapy: Patient Spontanous Breathing and Patient connected to face mask oxygen  Post-op Assessment: Report given to PACU RN and Post -op Vital signs reviewed and stable  Post vital signs: Reviewed and stable  Complications: No apparent anesthesia complications

## 2013-02-08 NOTE — Progress Notes (Signed)
He has developed rapid afib in the PACU.  He has no history of this in the past.  Have requested a Cardiology consult.

## 2013-02-08 NOTE — H&P (Signed)
PULMONARY  / CRITICAL CARE MEDICINE  Name: ATSUSHI YOM MRN: [id number] DOB: Jan 15, 1928    ADMISSION DATE:  02/08/2013 CONSULTATION DATE:  02/08/13  REFERRING MD :  Okey Dupre PRIMARY SERVICE: Surgery  CHIEF COMPLAINT:  S/P hiatal hernia repair  BRIEF PATIENT DESCRIPTION: 79 year old male s/p open repair of hiatal hernia 02/08/13 admitted following respiratory failure and A-fib with RVR in PACU.  SIGNIFICANT EVENTS / STUDIES:  5/27 S/P open repair of hiatal hernia  LINES / TUBES: 5/27 ETT>>>  CULTURES: None pending  ANTIBIOTICS: None  HISTORY OF PRESENT ILLNESS:  Mr. Lenn is a 79 year old male with a history of a recurrent hiatal hernia and reflux. He was taken to the OR 02/08/13 for an open repair of his hernia with a Nissen fundoplication. He tolerated the operation well and was take to PACU.  While in PACU he developed respiratory distress following extubation with A-fib and RVR. He received multiple agents to control his heart rhythm without success and was re-intubated.  He is admitted to the ICU for further management of his A-fib and the ventilator.  PAST MEDICAL HISTORY :  Past Medical History  Diagnosis Date  . BPH (benign prostatic hyperplasia)   . Macular degeneration     (L) wet, (R) dry  . B12 deficiency     HIstory  . 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 Leukoplakia 2001    Treated for  . Hypertension   . Iron deficiency anemia   . Cataract 2009    bilateral cataract surgery  . GERD (gastroesophageal reflux disease)   . Hyperlipidemia     not on medication  . Hiatal hernia   . Peripheral neuropathy     Mild History of  . Ulcer   . Bell's palsy     at present   Past Surgical History  Procedure Laterality Date  . Cholecystectomy    . Arthroscopic knee surgery      left  . Multiple oral surgeries  2002  . Tonsillectomy    . Cateract surgeries       bilateral  . Stomach surgery  2005    states his stomach had moved up toward his chest , some type  of surgery performed  . Hernia repair      Laparoscopic ventral  . Hiatal hernia repair  2004  . Skin cancer excision  10/2012    right leg-shin area   Prior to Admission medications   Medication Sig Start Date End Date Taking? Authorizing Provider  acetaminophen (TYLENOL) 325 MG tablet Take 650 mg by mouth every 6 (six) hours as needed for pain.   Yes Historical Provider, MD  Multiple Vitamins-Minerals (ICAPS MV PO) Take 1 tablet by mouth 2 (two) times daily.    Yes Historical Provider, MD  OVER THE COUNTER MEDICATION tums 1- 2 as needed for acid reflux   Yes Historical Provider, MD  pantoprazole (PROTONIX) 40 MG tablet Take 40 mg by mouth 2 (two) times daily before a meal.  10/27/12  Yes Historical Provider, MD  solifenacin (VESICARE) 5 MG tablet Take 2.5 mg by mouth 2 (two) times daily.    Yes Historical Provider, MD  Tamsulosin HCl (FLOMAX) 0.4 MG CAPS Take 0.8 mg by mouth daily.    Yes Historical Provider, MD  mupirocin ointment (BACTROBAN) 2 % Apply 1 application topically 2 (two) times daily.    Historical Provider, MD   No Known Allergies  FAMILY HISTORY:  Family History  Problem Relation Age of Onset  . Prostate cancer Paternal Grandfather   . Breast cancer Sister   . CAD Sister   . COPD Sister   . Heart attack Father    SOCIAL HISTORY:  reports that he quit smoking about 36 years ago. He has never used smokeless tobacco. He reports that  drinks alcohol. He reports that he does not use illicit drugs.  REVIEW OF SYSTEMS:    Unable to obtain review of systems since patient is intubated  SUBJECTIVE:   Intubated  VITAL SIGNS: Temp:  [97 F (36.1 C)-97.4 F (36.3 C)] 97.4 F (36.3 C) (05/27 1445) Pulse Rate:  [95-174] 95 (05/27 1558) Resp:  [10-27] 16 (05/27 1558) BP: (82-189)/(58-98) 102/62 mmHg (05/27 1558) SpO2:  [75 %-100 %] 100 % (05/27 1558) FiO2 (%):  [100 %] 100 % (05/27 1558) Weight:  [196 lb 3.4 oz (89 kg)] 196 lb 3.4 oz (89 kg) (05/27  1558) HEMODYNAMICS:   VENTILATOR SETTINGS: Vent Mode:  [-] PRVC FiO2 (%):  [100 %] 100 % Set Rate:  [10 bmp-14 bmp] 14 bmp Vt Set:  [450 mL-700 mL] 450 mL PEEP:  [5 cmH20] 5 cmH20 Plateau Pressure:  [17 cmH20] 17 cmH20 INTAKE / OUTPUT: Intake/Output     05/26 0701 - 05/27 0700 05/27 0701 - 05/28 0700   I.V. (mL/kg)  3600 (40.4)   Total Intake(mL/kg)  3600 (40.4)   Urine (mL/kg/hr)  210 (0.2)   Blood  100 (0.1)   Total Output   310   Net   +3290          PHYSICAL EXAMINATION: General:  Elderly male, intubated  Neuro:  Awake and able to follow commands HEENT:  PERRL, EOM intact, cough and gag present Cardiovascular:  RRR, S1 and S2, no JVD, +2 radial and DP pulses. No carotid bruits. Lungs:  CTA bilateral lung fields anterior Abdomen:  Round, soft, non-tender. Bowel sounds hypoactive Musculoskeletal:  MAE well and equal. Muscle strength is 5/5 bilateral upper and lower extremities Skin:  Skin is intact aside from surgical incisions  LABS: No results found for this basename: HGB, WBC, PLT, NA, K, CL, CO2, GLUCOSE, BUN, CREATININE, CALCIUM, MG, PHOS, AST, ALT, ALKPHOS, BILITOT, PROT, ALBUMIN, APTT, INR, LATICACIDVEN, TROPONINI, PROCALCITON, PROBNP, O2SATVEN, PHART, PCO2ART, PO2ART,  in the last 168 hours No results found for this basename: GLUCAP,  in the last 168 hours   Imaging: Dg Chest Port 1 View  02/08/2013   *RADIOLOGY REPORT*  Clinical Data: Intubation  PORTABLE CHEST - 1 VIEW  Comparison: 02/08/2013  Findings: Endotracheal tube is 3 cm above the carina.  Low lung volumes persist with basilar atelectasis.  No significant enlarging effusion or pneumothorax.  Previously described pneumoperitoneum beneath the right hemidiaphragm is not as apparent on today's study.  IMPRESSION: Endotracheal tube 3 cm above the carina.  Low volume exam with atelectasis   Original Report Authenticated By: Judie Petit. Miles Costain, M.D.   Dg Chest Port 1 View  02/08/2013   *RADIOLOGY REPORT*  Clinical Data:  Atrial fibrillation with RVR, hypoxia, evaluate for CHF  PORTABLE CHEST - 1 VIEW  Comparison: 01/27/2013  Findings: Low lung volumes with vascular crowding.  No frank interstitial edema.  Bibasilar opacities, likely atelectasis. No pleural effusion or pneumothorax.  Cardiomegaly.  Pneumoperitoneum beneath the right hemidiaphragm, likely postoperative.  Cholecystectomy clips.  IMPRESSION: No frank interstitial edema.  Low lung volumes with bibasilar opacities, likely atelectasis.  Pneumoperitoneum beneath the right hemidiaphragm, likely postoperative.   Original Report  Authenticated By: Charline Bills, M.D.    ASSESSMENT / PLAN:  PULMONARY A: Acute respiratory failure s/p surgical repair of hiatal hernia.  Noted to have Rt hemidiaphragm elevation on AP film after intubation ?significance. P:   -Full vent support overnight -Will plan for wean towards extubation in the morning -check abg -f/u CXR 5/28  CARDIOVASCULAR A: A-flutter with RVR-->now in Sinus  Need to r/o demand ischemia  P:  -Amiodarone & Cardizem per cards -IVF as needed to maintain BP -cycle CEs, check ECHO, r/o blood loss anemia -defer anticoagulation for now   RENAL A:  No active issues, nml renal fxn at BL: cr 0.73 P:   -f/u bmet and replace electrolytes as needed -follow I&O trend  GASTROINTESTINAL A:  S/P repair of hiatal hernia and Nissen Fundoplication P:   -Maintain NPO -Protonix for SUP -Follow surgery recs  HEMATOLOGIC A:  No active issues, bl hgb 14.6 P:  -Follow CBC  INFECTIOUS A:  No active issues P:   -Follow WBC -No current indication for antibiotic therapy  ENDOCRINE A:  No active issues   P:   -Follow CBG  NEUROLOGIC A:  Will keep lightly sedated overnight P:   -Fentanyl infusion for pain control -Propofol infusion for sedation -Goal RASS -1  TODAY'S SUMMARY: Mr. Hoy underwent an uncomplicated repair of a hiatal hernia today 5/27. He experienced acute respiratory failure and  a-flutter with RVR post-operatively. We will rest him on the vent overnight to gain control of his heart rhythm and attempt to extubate him in the morning.  I have personally obtained a history, examined the patient, evaluated laboratory and imaging results, formulated the assessment and plan and placed orders. CRITICAL CARE: The patient is critically ill with multiple organ systems failure and requires high complexity decision making for assessment and support, frequent evaluation and titration of therapies, application of advanced monitoring technologies and extensive interpretation of multiple databases. Critical Care Time devoted to patient care services described in this note is 40 minutes.    Pulmonary and Critical Care Medicine [facility] Pager: [phone]  02/08/2013, 5:04 PM  Reviewed above, examined pt, and agree with assessment/plan.  79 yo male s/p Nissen fundoplication for hiatal hernia with dysphagia/odynophagia and severe reflux.  He developed respiratory failure and A fib/flutter after being extubated in PACU.  This required re-intubation, and he has been started on amiodarone and diltiazem.  Cardiology has assessed pt also.  Currently hemodynamics improved on current level of support.  Noted to have elevated Rt hemidiaphragm on post-intubation CXR ?significance.  Will adjust Vt to 8 cc/kg.  Continue full vent support for now.  Continue sedation as needed.  F/u with cardiology evaluation.  Plan to push for extubation in AM of 5/28 if stable.  Updated pt's wife and children at beside about plan.  Also d/w Dr. Abbey Chatters.  Coralyn Helling, MD [facility] Pulmonary/Critical Care 02/08/2013, 5:11 PM Pager:  [phone] After 3pm call: [phone]

## 2013-02-08 NOTE — Consult Note (Signed)
CARDIOLOGY CONSULT NOTE   Patient ID: Victor Sutton MRN: [id number] DOB/AGE: 1928-07-07 79 y.o.  Admit date: 02/08/2013  Primary Physician   Kari Baars, MD Primary Cardiologist   HS saw in hosp in 2004 Reason for Consultation   Atrial fib, RVR  WUJ:WJXBJY Victor Sutton is a 79 y.o. male with no history of CAD.  Asked to see for rapid tachycardia post abdominal surgery. Seen by Garnette Scheuermann in 2004 Presented with anemia, gi bleed.  Echo ? EF 30-40% with diffuse hypokinesis.  Howerver f/u myovue normal no infarct or ischemia and EF 66%.  Preop ECG normal except a bit tachycardic at 99 bpm.  Post op was hypoxic.  CXR with pneumoperitonium and very poor lung volumes elevated right hemidiaphragm  Paralyzed and reintubated.  I gave patient adenosine to determine mechanism of tachycardia and clearly shows atrial flutter.  Currently on cardizem drip with stable BP and rate 140.  Before intubation nurse indicates no chest pain.  History of HTN.  And hyperlipidemia not on Rx   Past Medical History  Diagnosis Date  . BPH (benign prostatic hyperplasia)   . Macular degeneration     (Victor) wet, (R) dry  . B12 deficiency     HIstory  . 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 Leukoplakia 2001    Treated for  . Hypertension   . Iron deficiency anemia   . Cataract 2009    bilateral cataract surgery  . GERD (gastroesophageal reflux disease)   . Hyperlipidemia     not on medication  . Hiatal hernia   . Peripheral neuropathy     Mild History of  . Ulcer   . Bell's palsy     at present    Past Surgical History  Procedure Laterality Date  . Cholecystectomy    . Arthroscopic knee surgery      left  . Multiple oral surgeries  2002  . Tonsillectomy    . Cateract surgeries       bilateral  . Stomach surgery  2005    states his stomach had moved up toward his chest , some type of surgery performed  . Hernia repair      Laparoscopic ventral  . Hiatal hernia repair  2004  . Skin cancer excision   10/2012    right leg-shin area    No Known Allergies  I have reviewed the patient's current medications   . diltiazem (CARDIZEM) infusion 15 mg/hr (02/08/13 1430)  . lactated ringers     fentaNYL, promethazine  Prior to Admission medications   Medication Sig Start Date End Date Taking? Authorizing Provider  acetaminophen (TYLENOL) 325 MG tablet Take 650 mg by mouth every 6 (six) hours as needed for pain.   Yes Historical Provider, MD  Multiple Vitamins-Minerals (ICAPS MV PO) Take 1 tablet by mouth 2 (two) times daily.    Yes Historical Provider, MD  OVER THE COUNTER MEDICATION tums 1- 2 as needed for acid reflux   Yes Historical Provider, MD  pantoprazole (PROTONIX) 40 MG tablet Take 40 mg by mouth 2 (two) times daily before a meal.  10/27/12  Yes Historical Provider, MD  solifenacin (VESICARE) 5 MG tablet Take 2.5 mg by mouth 2 (two) times daily.    Yes Historical Provider, MD  Tamsulosin HCl (FLOMAX) 0.4 MG CAPS Take 0.8 mg by mouth daily.    Yes Historical Provider, MD  mupirocin ointment (BACTROBAN) 2 % Apply 1 application topically 2 (two) times daily.  Historical Provider, MD     History   Social History  . Marital Status: Married    Spouse Name: N/A    Number of Children: 3  . Years of Education: N/A   Occupational History  . Retired    Social History Main Topics  . Smoking status: Former Smoker    Quit date: 09/15/1976  . Smokeless tobacco: Never Used     Comment: Quit 1980  . Alcohol Use: Yes     Comment: Moderate alcohol, wine  . Drug Use: No  . Sexually Active: Not on file   Other Topics Concern  . Not on file   Social History Narrative  . Pt was living alone PTA and had home health assistance daily.    Family Status  Relation Status Death Age  . Mother Deceased   . Father Deceased    Family History  Problem Relation Age of Onset  . Prostate cancer Paternal Grandfather   . Breast cancer Sister   . CAD Sister   . COPD Sister   . Heart attack  Father     ROS:  Full 14 point review of systems complete and found to be negative unless listed above.  Physical Exam: Blood pressure 116/58, pulse 140, temperature 97.4 F (36.3 C), temperature source Oral, resp. rate 22, SpO2 97.00%.  General: Frail pale elderly male Head: Eyes PERRLA, No xanthomas.   Normocephalic and atraumatic, oropharynx without edema or exudate. Dentition:  Lungs: clear anteriorly on vent  Heart: HRRR S1 S2, no rub/gallop, Heart irregular rate and rhythm with S1, S2  murmur. pulses are 2+ extrem.   Neck: No carotid bruits. No lymphadenopathy.  JVD. Abdomen: Bowel sounds quiet  S/P lap surgery  Msk:  No spine or cva tenderness. No weakness, no joint deformities or effusions. Extremities: No clubbing or cyanosis.  edema.  Neuro: Unable to assess fully but responds to name Psych:  Not assessable  Skin: No rashes or lesions noted.  Labs:   Lab Results  Component Value Date   WBC 5.1 01/27/2013   HGB 14.6 01/27/2013   HCT 45.0 01/27/2013   MCV 86.0 01/27/2013   PLT 264 01/27/2013   Sodium  Date/Time Value Range Status  01/27/2013 10:45 AM 143  135 - 145 mEq/Victor Final   Potassium  01/27/2013 10:45 AM 4.2  3.5 - 5.1 mEq/Victor Final   Chloride  01/27/2013 10:45 AM 104  96 - 112 mEq/Victor Final   CO2  01/27/2013 10:45 AM 30  19 - 32 mEq/Victor Final   BUN  01/27/2013 10:45 AM 11  6 - 23 mg/dL Final   Creatinine, Ser  01/27/2013 10:45 AM 0.73  0.50 - 1.35 mg/dL Final    Echo: 16/06/9603 SUMMARY - Overall left ventricular systolic function was moderately decreased. Left ventricular ejection fraction was estimated , range being 30 % to 40 %. There was moderate diffuse left ventricular hypokinesis. Left ventricular wall thickness was mildly increased. - Aortic valve thickness was mildly increased. - There was mild to moderate thickening of the mitral valve involving the anterior more than the posterior leaflet. No echocardiographic evidence for mitral valve prolapse. -  Left atrial size was at the upper limits of normal.   ECG:  08-Feb-2013 12:37:03   Atrial fibrillation with rapid ventricular response with premature ventricular or aberrantly conducted complexes Marked ST abnormality, possible lateral subendocardial injury Abnormal ECG 10mm/s 19mm/mV 100Hz  8.0.1 12SL 241 HD CID: 1 Referred by: Unconfirmed Vent. rate 166 BPM PR interval *  ms QRS duration 96 ms QT/QTc 280/465 ms P-R-T axes * 8 181   01-Jan-2013 21:34:20   Normal sinus rhythm Left axis deviation Left ventricular hypertrophy Abnormal ECG 56mm/s 6mm/mV 150Hz  8.0.1 12SL 235 CID: [id number] Referred by: Confirmed By: Darcella Cheshire MD Vent. rate 99 BPM PR interval 164 ms QRS duration 86 ms QT/QTc 344/441 ms P-R-T axes 43 -7 8  ECG : post op atrial flutter rate 166 nonspecific ST/T wave changes  Radiology:  09/23/2002 Adenosine Cardiolite FINDINGS CLINICAL DATA: ANEMIA. HYPERTENSION. DYSPNEA ON EXERTION. FATIGUE. MYOCARDIAL PERFUSION STUDY WITH SPECT, EJECTION FRACTION CALCULATION AND WALL MOTION ANALYSIS DOSAGE OF SESTAMIBI: 30 mCi WITH ADENOSINE STRESS AND 10 mCi  AT REST. MYOCARDIAL PERFUSION STUDY: NO PERFUSION DEFECT IS IDENTIFIED TO SUGGEST SCAR OR INDUCIBLE ISCHEMIA. EJECTION FRACTION CALCULATION END-DIASTOLIC VOLUME IS 107 ML. END-SYSTOLIC VOLUME IS 37 ML. DERIVED LEFT VENTRICULAR RESTING EJECTION FRACTION IS 66 PERCENT. WALL MOTION ANALYSIS: LEFT VENTRICULAR WALL MOTION AND WALL THICKENING APPEAR NORMAL. IMPRESSION 1. NORMAL MYOCARDIAL PERFUSION STUDY WITH LEFT VENTRICULAR RESTING  EJECTION FRACTION OF 66 PERCENT.   ASSESSMENT AND PLAN:     Atrial Flutter.  Post op with rapid rate Adenosine confirms flutter mechanism.  Continue cardizem drip Will add amiodarone bolus and infusion.  Rate should  Improve on meds and with better airway after intubation. No anticoagulation in post op state.  Will need surgery input about when heparin can be started if    Flutter persists.  Follow aeration and CXR  Follow Hct/Hb  Will order echo since there was a discrepency in 2004  Order enzymes to r/o although no significant Ischemic changes on ECG  [employer]

## 2013-02-08 NOTE — Progress Notes (Signed)
Patient showing no improvement from a respiratory standpoint. WOB increased, decision made to reintubate patient. 50mg  propofol, and 80 mg sux given IV, DL x 1 , positive BS B, +ETCO2. To ICU, diltiazem drip increased to 15 mg/hr, cardiology to see patient when available.

## 2013-02-08 NOTE — Progress Notes (Addendum)
Amiodarone Drug - Drug Interaction Consult Note  Amiodarone is metabolized by the cytochrome P450 system and therefore has the potential to cause many drug interactions. Amiodarone has an average plasma half-life of 50 days (range 20 to 100 days).   Recommendations: - Monitor QTc closely on Amiodarone and Zofran (scheduled x 4 doses then PRN).  QTc on EKG 5/27 = 465  Geoffry Paradise, PharmD, [place] Pager: [phone] 4:22 PM Pharmacy #: [id number]

## 2013-02-08 NOTE — Progress Notes (Signed)
Called to pacu b/c of afib with RVR pulse in 150's, patient also disoriented and having difficulty maintaining oxygen saturations adequately without 10L flow. Diltiazem bolus and drip ordered, cardiology consulted. Will reassess pulmonary status post loading dose. Patient will go to telemetry or stepdown post PACU.Victor Sutton

## 2013-02-08 NOTE — Anesthesia Preprocedure Evaluation (Addendum)
Anesthesia Evaluation  Patient identified by MRN, date of birth, ID band Patient awake    Reviewed: Allergy & Precautions, H&P , NPO status , Patient's Chart, lab work & pertinent test results  Airway Mallampati: II TM Distance: >3 FB Neck ROM: Full    Dental no notable dental hx.    Pulmonary neg pulmonary ROS,  breath sounds clear to auscultation  Pulmonary exam normal       Cardiovascular hypertension, Pt. on medications Rhythm:Regular Rate:Normal     Neuro/Psych negative neurological ROS  negative psych ROS   GI/Hepatic Neg liver ROS, hiatal hernia, GERD-  Medicated,  Endo/Other  negative endocrine ROS  Renal/GU negative Renal ROS  negative genitourinary   Musculoskeletal negative musculoskeletal ROS (+)   Abdominal   Peds negative pediatric ROS (+)  Hematology negative hematology ROS (+)   Anesthesia Other Findings   Reproductive/Obstetrics negative OB ROS                          Anesthesia Physical Anesthesia Plan  ASA: II  Anesthesia Plan: General   Post-op Pain Management:    Induction: Intravenous  Airway Management Planned: Oral ETT  Additional Equipment:   Intra-op Plan:   Post-operative Plan: Extubation in OR  Informed Consent: I have reviewed the patients History and Physical, chart, labs and discussed the procedure including the risks, benefits and alternatives for the proposed anesthesia with the patient or authorized representative who has indicated his/her understanding and acceptance.   Dental advisory given  Plan Discussed with: CRNA and Surgeon  Anesthesia Plan Comments:         Anesthesia Quick Evaluation

## 2013-02-08 NOTE — Progress Notes (Signed)
Had to be re-intubated and placed on the ventilator due to respiratory distress.  He looks significantly better now.  He is afebrile.  His HR is 95.  He is awake and alert.  Discussed events with his family.

## 2013-02-08 NOTE — Anesthesia Postprocedure Evaluation (Signed)
  Anesthesia Post-op Note  Patient: Victor Sutton  Procedure(s) Performed: Procedure(s) (LRB):  REPAIR OFCURRENT HIATAL HERNIA,  (N/A) UPPER GI ENDOSCOPY (N/A) LAPAROSCOPIC LYSIS OF ADHESIONS (N/A) LAPAROSCOPIC NISSEN FUNDOPLICATION  Patient Location: PACU  Anesthesia Type: General  Level of Consciousness: sedated  Airway and Oxygen Therapy: mechanically ventilated  Post-op Pain: mild  Post-op Assessment: Post-op Vital signs reviewed, a fib with RVR pulse 120's, BP stable, to ICU  Last Vitals:  Filed Vitals:   02/08/13 1445  BP: 138/65  Pulse: 140  Temp: 36.3 C  Resp: 11    Post-op Vital Signs: stable   Complications: No apparent anesthesia complications

## 2013-02-08 NOTE — H&P (View-Only) (Signed)
Patient ID: Victor Sutton, male   DOB: 12/23/1927, 79 y.o.   MRN: [id number]  Chief Complaint  Patient presents with  . Follow-up    discuss SX    HPI Victor Sutton is a 79 y.o. male.   HPI  He is here for a preoperative visit. He has a recurrent hiatal hernia and reflux. I saw him 3 months ago and we spoke about laparoscopic possible open repair. At that time, his wife D. The major surgery. She has recovered from that. He recently had Bell's palsy but is improving from this. Still having difficulty swallowing. Having some intermittent shortness of breath which is felt to be secondary to his hiatal hernia.  Past Medical History  Diagnosis Date  . BPH (benign prostatic hyperplasia)   . Macular degeneration     (L) wet, (R) dry  . B12 deficiency     HIstory  . 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 Leukoplakia 2001    Treated for  . Hypertension   . Iron deficiency anemia   . Cataract 2009    bilateral cataract surgery  . GERD (gastroesophageal reflux disease)   . Hyperlipidemia     not on medication  . Hiatal hernia   . Peripheral neuropathy     Mild History of  . Ulcer     Past Surgical History  Procedure Laterality Date  . Cholecystectomy    . Arthroscopic knee surgery      left  . Multiple oral surgeries  2002  . Tonsillectomy    . Cateract surgeries       bilateral  . Stomach surgery  2005    states his stomach had moved up toward his chest , some type of surgery performed  . Hernia repair      Laparoscopic ventral  . Hiatal hernia repair  2004    Family History  Problem Relation Age of Onset  . Prostate cancer Paternal Grandfather   . Breast cancer Sister   . CAD Sister   . COPD Sister   . Heart attack Father     Social History History  Substance Use Topics  . Smoking status: Former Smoker    Quit date: 09/15/1976  . Smokeless tobacco: Never Used     Comment: Quit 1980  . Alcohol Use: Yes     Comment: Moderate alcohol, wine    No Known  Allergies  Current Outpatient Prescriptions  Medication Sig Dispense Refill  . Multiple Vitamins-Minerals (ICAPS MV PO) Take by mouth 2 (two) times daily.      . pantoprazole (PROTONIX) 20 MG tablet Take 20 mg by mouth daily.      . pantoprazole (PROTONIX) 40 MG tablet       . solifenacin (VESICARE) 5 MG tablet Take 5 mg by mouth daily. Taje 1 tab by mouth twice daily.      . Tamsulosin HCl (FLOMAX) 0.4 MG CAPS Take 0.4 mg by mouth daily. Take 2 caps      . acyclovir (ZOVIRAX) 800 MG tablet Take 1 tablet (800 mg total) by mouth 5 (five) times daily.  35 tablet  0  . Cholecalciferol (VITAMIN D) 2000 UNITS tablet Take 2,000 Units by mouth 2 (two) times daily.      . Coenzyme Q10 (CO Q 10) 100 MG CAPS Take by mouth. Take 1 daily .      . esomeprazole (NEXIUM) 40 MG capsule Take 1 capsule (40 mg total) by mouth daily before breakfast.    20 capsule  0  . fexofenadine (ALLEGRA ALLERGY) 180 MG tablet Take 180 mg by mouth. Take 1 tab by mouth as needed.      . iron polysaccharides (NU-IRON) 150 MG capsule Take 1 capsule (150 mg total) by mouth 2 (two) times daily.  60 capsule  3  . Multiple Vitamins-Minerals (MULTIVITAMIN PO) Take 2 capsules by mouth. Daily      . Omega-3 Fatty Acids (FISH OIL) 1200 MG CAPS Take 1 capsule by mouth daily.      . predniSONE (DELTASONE) 10 MG tablet Take 3 tablets per day for 2 days, then 2 tablets per day for 2 days, then 1 tablet per day for 2 days.  15 tablet  0   No current facility-administered medications for this visit.    Review of Systems Review of Systems  HENT: Positive for trouble swallowing.   Respiratory: Positive for shortness of breath.   Neurological: Positive for facial asymmetry. Headaches: Due to Bell's palsy.    Blood pressure 134/80, pulse 78, temperature 98.4 F (36.9 C), resp. rate 18, height 5' 10" (1.778 m), weight 204 lb (92.534 kg).  Physical Exam Physical Exam  Constitutional: No distress.  Overweight male  HENT:  Slight right  labial droop. Slight right eyelid droop. Slight right facial droop.  Neck: Neck supple.  Cardiovascular: Normal rate and regular rhythm.   Pulmonary/Chest: Effort normal and breath sounds normal.  Abdominal: Soft. He exhibits no mass. There is no tenderness.  Multiple small upper abdominal scars.  Skin: Skin is warm and dry.    Data Reviewed Old  Assessment    Recurrent hiatal hernia and gastroesophageal reflux for laparoscopic Nissen fundoplication and hiatal hernia repair in the past. He is getting over Bell's palsy. He would like to schedule surgery.     Plan    Laparoscopic possible open repair of recurrent hiatal hernia, Nissen fundoplication, gastropexy. The procedure, aftercare, and risks were discussed with him and his wife again in detail. They seem to understand all this.        Maleigha Colvard J 01/11/2013, 12:08 PM    

## 2013-02-08 NOTE — Op Note (Signed)
Operative Note  Victor Sutton male 79 y.o. 02/08/2013  PREOPERATIVE DX:  Recurrent hiatal hernia and gastroesophageal reflux  POSTOPERATIVE DX:  Same  PROCEDURE:  Laparoscopic lysis of adhesions (1 hour 45 minutes), laparoscopic redo hiatal hernia repair, laparoscopic Nissen fundoplication, upper endoscopy (by Dr. Luretha Murphy).         Surgeon: Adolph Pollack   Assistants: Dr. 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 He developed recurrent anemia as well as dysphagia and some heartburn. He is found to have a large recurrent hiatal hernia. He has elected to undergo repair.    Procedure Detail:  He was seen in the holding area. He is brought to the operating room laid supine on the operating table and a general anesthetic was given. A Foley catheter was inserted. Hair on the abdominal wall was clipped. The abdominal wall was widely sterilely prepped and draped.  A previous lower epigastric small midline incision was re\re incised after injecting Marcaine solution. The subcutaneous tissue was divided bluntly. A small incision was made in the midline fascia. There was resistance in the preperitoneal space when I tried to enter the peritoneal cavity. A pursestring suture of 0 Vicryl is placed around the fascial edges. A 5 mm incision was then made in the left upper quadrant. Using a 5 mm Optiview trocar and laparoscope access was gained to the peritoneal cavity and pneumoperitoneum was created. I wasn't able to see that there is a small piece of mesh from her previous ventral hernia repair in the lower epigastrium as it was direct the [place] trocar inferior to this.  A 5 mm trocar was placed in the right upper quadrant. Adhesions were noted between the omentum and intra-abdominal wall and the upper  abdomen and these were divided sharply. A second 5 mm trochars placed in the lateral left upper quadrant. An 11 mm trochars placed just to the right of the midline in the epigastric region. Using sharp dissection and harmonic scalpel I slowly mobilized adhesions between the omentum and falciform ligament, the omentum and anterior abdominal wall, and the omentum and left lobe of the liver.  I then divided adhesions between the stomach and anterior abdominal wall. A previous gastropexy at the performed and this was taken down leaving some abdominal wall on the stomach. I continued to sharply separate the stomach from the abdominal wall and the liver by dividing adhesions. Adhesions between the omentum and anterior hiatus were divided with the harmonic scalpel. Total lysis of adhesions took one hour and 45 minutes.  Beginning on the left side I divided short gastric vessels heading up toward the left crus. The left crus was identified. Adhesions between the stomach and the crus were divided sharply. I was able to mobilize the fundus by dividing the short gastric vessels and identified the left crus and trace it posteriorly.  I subsequently approached the right side and was able to identify the right crus using blunt dissection. A replaced left hepatic artery was noted and preserved. Multiple adhesions between the thin gastrohepatic tissue and the right crus which were divided with the harmonic scalpel. Using blunt dissection I was unable to make a retroesophageal window. We can then reduce the stomach  out of the mediastinum. The recurrent hiatal hernia was posterior and to the left.  The hernia sac was then divided with the  harmonic scalpel and brought back below the hiatus. The mediastinum was inspected there was no evidence of bleeding. The esophagus was identified. Upper endoscopy was then performed at by Dr. Daphine Deutscher identifying the gastroesophageal junction. There was no evidence of leak from the esophagus or  proximal stomach.  A Penrose drain was then used to retract the gastroesophageal junction anteriorly which allowed for further dissection freeing up the hernia sac posteriorly.  At this point, the stomach was completely reduced back into the peritoneal cavity and 3 cm of anterior abdominal esophagus was noted.  I then repaired the hiatal hernia with four size 0 pledgeted nonabsorbable sutures. This provided for adequate closure of the hernia defect with no significant tension.  A size 56 bougie was then passed down the esophagus into the stomach with ease. A 360 Nissen fundoplication was performed with 3 size 0 interrupted sutures. The wrap measured 2-2.5 cm and was under no tension. The bougie was removed intact. The wrap was floppy.  4 quadrant inspection was then performed, there was no evidence of bleeding or organ injury. Area of adhesio- lysis was carefully inspected and there was no evidence of bleeding or organ injury.  The [facility] trocar was removed and the fascial defect closed under laparoscopic vision by tightening up and tying down the pursestring suture. The remaining trocars were removed and the pneumoperitoneum was released. The skin incisions were closed with 4-0 Monocryl subcuticular stitches. Steri-Strips and sterile dressings were applied.  He tolerated the procedure well without any apparent complications and was taken to recovery room in satisfactory condition.   Estimated Blood Loss:  300 mL         Drains: none  Blood Given: none          Specimens: none        Complications:  * No complications entered in OR log *         Disposition: PACU - hemodynamically stable.         Condition: stable

## 2013-02-09 ENCOUNTER — Inpatient Hospital Stay (HOSPITAL_COMMUNITY): Payer: Medicare Other

## 2013-02-09 ENCOUNTER — Encounter (HOSPITAL_COMMUNITY): Payer: Self-pay | Admitting: General Surgery

## 2013-02-09 DIAGNOSIS — I517 Cardiomegaly: Secondary | ICD-10-CM

## 2013-02-09 LAB — CBC
HCT: 39.9 % (ref 39.0–52.0)
Hemoglobin: 13.1 g/dL (ref 13.0–17.0)
MCH: 27.9 pg (ref 26.0–34.0)
MCHC: 32.8 g/dL (ref 30.0–36.0)
MCV: 84.9 fL (ref 78.0–100.0)
Platelets: 269 10*3/uL (ref 150–400)
RBC: 4.7 MIL/uL (ref 4.22–5.81)
RDW: 16.7 % — ABNORMAL HIGH (ref 11.5–15.5)
WBC: 11.4 10*3/uL — ABNORMAL HIGH (ref 4.0–10.5)

## 2013-02-09 LAB — BLOOD GAS, ARTERIAL
Acid-Base Excess: 1.6 mmol/L (ref 0.0–2.0)
Bicarbonate: 22.3 mEq/L (ref 20.0–24.0)
Drawn by: 244901
FIO2: 0.4 %
MECHVT: 580 mL
O2 Saturation: 97.6 %
PEEP: 5 cmH2O
Patient temperature: 96.2
RATE: 14 resp/min
TCO2: 19.1 mmol/L (ref 0–100)
pCO2 arterial: 23.1 mmHg — ABNORMAL LOW (ref 35.0–45.0)
pH, Arterial: 7.584 — ABNORMAL HIGH (ref 7.350–7.450)
pO2, Arterial: 77.7 mmHg — ABNORMAL LOW (ref 80.0–100.0)

## 2013-02-09 LAB — BASIC METABOLIC PANEL
BUN: 12 mg/dL (ref 6–23)
CO2: 27 mEq/L (ref 19–32)
Calcium: 9 mg/dL (ref 8.4–10.5)
Chloride: 101 mEq/L (ref 96–112)
Creatinine, Ser: 0.71 mg/dL (ref 0.50–1.35)
GFR calc Af Amer: >90 mL/min (ref >90)
GFR calc non Af Amer: 85 mL/min — ABNORMAL LOW (ref >90)
Glucose, Bld: 185 mg/dL — ABNORMAL HIGH (ref 70–99)
Potassium: 3.8 mEq/L (ref 3.5–5.1)
Sodium: 137 mEq/L (ref 135–145)

## 2013-02-09 LAB — GLUCOSE, CAPILLARY
Glucose-Capillary: 131 mg/dL — ABNORMAL HIGH (ref 70–99)
Glucose-Capillary: 115 mg/dL — ABNORMAL HIGH (ref 70–99)
Glucose-Capillary: 107 mg/dL — ABNORMAL HIGH (ref 70–99)
Glucose-Capillary: 101 mg/dL — ABNORMAL HIGH (ref 70–99)

## 2013-02-09 LAB — MAGNESIUM: Magnesium: 1.8 mg/dL (ref 1.5–2.5)

## 2013-02-09 LAB — TROPONIN I: Troponin I: <0.3 ng/mL (ref <0.30)

## 2013-02-09 MED ORDER — FUROSEMIDE 10 MG/ML IJ SOLN
20.0000 mg | Freq: Once | INTRAMUSCULAR | Status: AC
  Administered 2013-02-09: 20 mg via INTRAVENOUS
  Filled 2013-02-09: qty 4

## 2013-02-09 MED ORDER — AMIODARONE HCL 200 MG PO TABS
400.0000 mg | ORAL_TABLET | Freq: Two times a day (BID) | ORAL | Status: DC
  Filled 2013-02-09 (×2): qty 2

## 2013-02-09 MED ORDER — CHLORHEXIDINE GLUCONATE 0.12 % MT SOLN
15.0000 mL | Freq: Two times a day (BID) | OROMUCOSAL | Status: DC
  Administered 2013-02-09: 15 mL via OROMUCOSAL

## 2013-02-09 MED ORDER — AMIODARONE HCL IN DEXTROSE 360-4.14 MG/200ML-% IV SOLN
INTRAVENOUS | Status: AC
  Administered 2013-02-09: 200 mL
  Filled 2013-02-09: qty 200

## 2013-02-09 MED ORDER — AMIODARONE HCL IN DEXTROSE 360-4.14 MG/200ML-% IV SOLN
30.0000 mg/h | INTRAVENOUS | Status: DC
  Administered 2013-02-09 – 2013-02-10 (×2): 30 mg/h via INTRAVENOUS
  Filled 2013-02-09: qty 200

## 2013-02-09 MED ORDER — AMIODARONE HCL IN DEXTROSE 360-4.14 MG/200ML-% IV SOLN: 30.0000 mg/h | INTRAVENOUS | Status: DC

## 2013-02-09 MED ORDER — BIOTENE DRY MOUTH MT LIQD
15.0000 mL | Freq: Four times a day (QID) | OROMUCOSAL | Status: DC
  Administered 2013-02-09: 15 mL via OROMUCOSAL

## 2013-02-09 MED ORDER — AMIODARONE HCL IN DEXTROSE 360-4.14 MG/200ML-% IV SOLN
INTRAVENOUS | Status: AC
  Filled 2013-02-09: qty 200

## 2013-02-09 MED ORDER — CHLORHEXIDINE GLUCONATE 0.12 % MT SOLN
OROMUCOSAL | Status: AC
  Administered 2013-02-09: 15 mL
  Filled 2013-02-09: qty 15

## 2013-02-09 NOTE — Progress Notes (Addendum)
PULMONARY  / CRITICAL CARE MEDICINE  Name: Victor Sutton MRN: [id number] DOB: [birth year]    ADMISSION DATE:  02/08/2013 CONSULTATION DATE:  02/08/13  REFERRING MD :  Okey Dupre PRIMARY SERVICE: Surgery  CHIEF COMPLAINT:  S/P hiatal hernia repair  BRIEF PATIENT DESCRIPTION: 79 year old male s/p open repair of hiatal hernia 02/08/13 admitted following respiratory failure and A-fib with RVR in PACU.  SIGNIFICANT EVENTS / STUDIES:  5/27 S/P open repair of hiatal hernia  LINES / TUBES: 5/27 ETT>>> 5/28  CULTURES: None pending  ANTIBIOTICS: None  HISTORY OF PRESENT ILLNESS:  Victor Sutton is a 79 year old male with a history of a recurrent hiatal hernia and reflux. He was taken to the OR 02/08/13 for an open repair of his hernia with a Nissen fundoplication. He tolerated the operation well and was take to PACU.  While in PACU he developed respiratory distress following extubation with A-fib and RVR. He received multiple agents to control his heart rhythm without success and was re-intubated.  He is admitted to the ICU for further management of his A-fib and the ventilator.   REVIEW OF SYSTEMS:    Unable to obtain review of systems since patient is intubated  SUBJECTIVE:   Intubated  VITAL SIGNS: Temp:  [96.2 F (35.7 C)-97.5 F (36.4 C)] 97.5 F (36.4 C) (05/28 0804) Pulse Rate:  [47-174] 64 (05/28 0804) Resp:  [10-28] 18 (05/28 0804) BP: (82-189)/(55-98) 148/82 mmHg (05/28 0804) SpO2:  [75 %-100 %] 100 % (05/28 0804) FiO2 (%):  [40 %-100 %] 40 % (05/28 0748) Weight:  [89 kg (196 lb 3.4 oz)] 89 kg (196 lb 3.4 oz) (05/27 1558) HEMODYNAMICS:   VENTILATOR SETTINGS: Vent Mode:  [-] CPAP FiO2 (%):  [40 %-100 %] 40 % Set Rate:  [10 bmp-14 bmp] 14 bmp Vt Set:  [450 mL-700 mL] 580 mL PEEP:  [5 cmH20] 5 cmH20 Pressure Support:  [5 cmH20] 5 cmH20 Plateau Pressure:  [17 cmH20-20 cmH20] 19 cmH20 INTAKE / OUTPUT: Intake/Output     05/27 0701 - 05/28 0700 05/28 0701 - 05/29 0700   I.V. (mL/kg) 5194.3 (58.4) 76.7 (0.9)   IV Piggyback 100    Total Intake(mL/kg) 5294.3 (59.5) 76.7 (0.9)   Urine (mL/kg/hr) 660 (0.3)    Blood 100 (0)    Total Output 760     Net +4534.3 +76.7          PHYSICAL EXAMINATION: General:  Elderly male, intubated  Neuro:  Awake and able to follow commands HEENT:  PERRL, EOM intact, cough and gag present Cardiovascular:  RRR, S1 and S2, no JVD, +2 radial and DP pulses. No carotid bruits. Lungs:  CTA bilateral lung fields anterior Abdomen:  Round, soft, non-tender. Bowel sounds hypoactive Musculoskeletal:  MAE well and equal. Muscle strength is 5/5 bilateral upper and lower extremities Skin:  Skin is intact aside from surgical incisions  LABS:  Recent Labs Lab 02/08/13 1643 02/09/13 0330  NA 142 137  K 3.3* 3.8  CL 101 101  CO2 22 27  BUN 13 12  CREATININE 0.67 0.71  GLUCOSE 204* 185*    Recent Labs Lab 02/08/13 1641 02/09/13 0330  HGB 13.7 13.1  HCT 42.2 39.9  WBC 15.9* 11.4*  PLT 282 269     Imaging: Per my review: persistent low lung volumes. Elevated right hemidiaphragm  ASSESSMENT / PLAN:  PULMONARY A: Acute respiratory failure s/p surgical repair of hiatal hernia.  Noted to have Rt hemidiaphragm elevation on AP film  after intubation ?significance. P:   -extubate today -Will need IS and pulmonary toilet following extubation  CARDIOVASCULAR A: A-flutter with RVR-->now in Sinus  Need to r/o demand ischemia  P:  -Now off amiodarone and cardiazem infusions -Will continue amiodarone per cardiology recommendations   RENAL A:  No active issues, nml renal fxn at BL: cr 0.73 P:   -f/u bmet and replace electrolytes as needed -follow I&O trend  GASTROINTESTINAL A:  S/P repair of hiatal hernia and Nissen Fundoplication P:   -Maintain NPO -Protonix for SUP -Follow surgery recs -SLP to see patient following extubation  HEMATOLOGIC A:  No active issues, bl hgb 14.6 P:  -Follow CBC  INFECTIOUS A:  No  active issues P:   -Follow WBC -No current indication for antibiotic therapy  ENDOCRINE A:  No active issues   P:   -Follow CBG  NEUROLOGIC A:  Awake and following commands P:   -Morphine for pain control  TODAY'S SUMMARY: Victor Sutton converted to sinus rhythm overnight and was able to be weaned from his amiodarone and cardiazem infusions. Will extubate today.   Note by Kathrin Greathouse NP student and Roslynn Amble NP    STAFF NOTE: I, Dr Lavinia Sharps have personally reviewed patient's available data, including medical history, events of note, physical examination and test results as part of my evaluation. I have discussed with resident/NP and other care providers such as pharmacist, RN and RRT.  In addition,  I personally evaluated patient and elicited key findings of post op acute resp failure presumably due to anesthesia over-hang and a Fib. Currently well. CXR shows low lung volumes with iniflrates. Will check bnp and give empiric lasix. Will extubate. Will get SLP swallow eval on account of prior Bell''s palsy and current GI surgery.   Rest per NP/medical resident whose note is outlined above and that I agree with       Dr. Kalman Shan, M.D., [facility].C.P Pulmonary and Critical Care Medicine Staff Physician [facility] Pager: [phone], If no answer or between  15:00h - 7:00h: call [phone]  02/09/2013 10:09 AM

## 2013-02-09 NOTE — Progress Notes (Signed)
SLP Cancellation Note  Patient Details Name: Victor Sutton MRN: [id number] DOB: [birth year]   Cancelled evaluation:       Reason Eval/Treat Not Completed: Per RN, MD asked that we hold swallow eval until after UGI tomorrow.   Will follow next date.   [name] 02/09/2013, 1:53 PM

## 2013-02-09 NOTE — Progress Notes (Signed)
   Subjective:  Intubated; alert; Denies CP or dyspnea   Objective:  Filed Vitals:   02/09/13 0500 02/09/13 0538 02/09/13 0600 02/09/13 0700  BP: 117/68  120/66 124/63  Pulse: 55  52 51  Temp:  96.8 F (36 C)    TempSrc:  Axillary    Resp: 25  25 26   Height:      Weight:      SpO2: 99%  99% 99%    Intake/Output from previous day:  Intake/Output Summary (Last 24 hours) at 02/09/13 0816 Last data filed at 02/09/13 0720  Gross per 24 hour  Intake 5296.05 ml  Output    760 ml  Net 4536.05 ml    Physical Exam: Physical exam: Well-developed well-nourished in no acute distress.  Skin is warm and dry.  HEENT is normal.  Neck is supple.  Chest is clear to auscultation with normal expansion.  Cardiovascular exam is regular rate and rhythm.  Abdominal exam s/p abdominal surgery Extremities show no edema. neuro grossly intact    Lab Results: Basic Metabolic Panel:  Recent Labs  16/10/96 1641 02/08/13 1643 02/09/13 0330  NA  --  142 137  K  --  3.3* 3.8  CL  --  101 101  CO2  --  22 27  GLUCOSE  --  204* 185*  BUN  --  13 12  CREATININE  --  0.67 0.71  CALCIUM  --  9.1 9.0  MG  --  1.8 1.8  PHOS 3.4  --   --    CBC:  Recent Labs  02/08/13 1641 02/09/13 0330  WBC 15.9* 11.4*  HGB 13.7 13.1  HCT 42.2 39.9  MCV 87.6 84.9  PLT 282 269   Cardiac Enzymes:  Recent Labs  02/08/13 1642 02/08/13 2101 02/09/13 0330  TROPONINI <0.30 <0.30 <0.30     Assessment/Plan:  1 postoperative atrial flutter-the patient is in sinus rhythm. His Cardizem and amiodarone were discontinued last evening. I will add by mouth amiodarone and continue for 6-8 weeks. Atrial arrhythmias most likely related to hyperadrenergic state associated with surgery. No anticoagulation given recent surgery. 2 Status post abdominal surgery-management per general surgery. 3 VDRF-critical care medicine weaning ventilator.  Olga Millers 02/09/2013, 8:16 AM

## 2013-02-09 NOTE — Progress Notes (Signed)
eLink Physician-Brief Progress Note Patient Name: Victor Sutton DOB: 1928/04/12 MRN: [id number]  Date of Service  02/09/2013   HPI/Events of Note  Patient with AF/RVR in PACU earlier dosed with amio and gtt.  Now on fentanyl/versed with HR in the 50s dipping down into the 40s.  HD stable.   eICU Interventions  Plan: D/C amio gtt cotninue to monitor HR   Intervention Category Intermediate Interventions: Arrhythmia - evaluation and management  DETERDING,ELIZABETH 02/09/2013, 12:33 AM

## 2013-02-09 NOTE — Progress Notes (Signed)
CARE MANAGEMENT NOTE 02/09/2013  Patient:  Victor Sutton, Victor Sutton   Account Number:  [id number]  Date Initiated:  02/09/2013  Documentation initiated by:  Sonam Huelsmann  Subjective/Objective Assessment:   patient had fundoplication on 05272014/s/p surg a.fib with rvr, resp distress-intubated     Action/Plan:   lives at home with his wife   Anticipated DC Date:  02/12/2013   Anticipated DC Plan:  HOME/SELF CARE  In-house referral  NA      DC Planning Services  NA      [facility] Choice  NA   Choice offered to / List presented to:  NA   DME arranged  NA      DME agency  NA     HH arranged  NA      HH agency  NA   Status of service:  In process, will continue to follow Medicare Important Message given?  NA - LOS <3 / Initial given by admissions (If response is "NO", the following Medicare IM given date fields will be blank) Date Medicare IM given:   Date Additional Medicare IM given:    Discharge Disposition:    Per UR Regulation:  Reviewed for med. necessity/level of care/duration of stay  If discussed at Long Length of Stay Meetings, dates discussed:    Comments:  [id number] Earlene Plater, RN, BSN, CCM:  CHART REVIEWED AND UPDATED.  Next chart review due on [id number]. NO DISCHARGE NEEDS PRESENT AT THIS TIME. CASE MANAGEMENT [phone]

## 2013-02-09 NOTE — Progress Notes (Signed)
*  PRELIMINARY RESULTS* Echocardiogram 2D Echocardiogram has been performed.  Victor Sutton 02/09/2013, 12:34 PM

## 2013-02-09 NOTE — Progress Notes (Signed)
eLink Physician-Brief Progress Note Patient Name: Victor Sutton DOB: 12-Aug-1928 MRN: [id number]  Date of Service  02/09/2013   HPI/Events of Note     eICU Interventions  Amio order calrified 30/h gtt since npo   Intervention Category Intermediate Interventions: Arrhythmia - evaluation and management  [name] 02/09/2013, 9:05 PM

## 2013-02-09 NOTE — Progress Notes (Signed)
1 Day Post-Op  Subjective: Awake and alert on vent.  Denies any pain.  Objective: Vital signs in last 24 hours: Temp:  [96.2 F (35.7 C)-97.4 F (36.3 C)] 96.8 F (36 C) (05/28 0538) Pulse Rate:  [47-174] 51 (05/28 0700) Resp:  [10-28] 26 (05/28 0700) BP: (82-189)/(55-98) 124/63 mmHg (05/28 0700) SpO2:  [75 %-100 %] 99 % (05/28 0700) FiO2 (%):  [40 %-100 %] 40 % (05/28 0748) Weight:  [196 lb 3.4 oz (89 kg)] 196 lb 3.4 oz (89 kg) (05/27 1558)    Intake/Output from previous day: 05/27 0701 - 05/28 0700 In: 5294.3 [I.V.:5194.3; IV Piggyback:100] Out: 760 [Urine:660; Blood:100] Intake/Output this shift: Total I/O In: 1.7 [I.V.:1.7] Out: -   PE: General- In NAD CV-RRR Abdomen-soft, nontender, dressings dry  Lab Results:   Recent Labs  02/08/13 1641 02/09/13 0330  WBC 15.9* 11.4*  HGB 13.7 13.1  HCT 42.2 39.9  PLT 282 269   BMET  Recent Labs  02/08/13 1643 02/09/13 0330  NA 142 137  K 3.3* 3.8  CL 101 101  CO2 22 27  GLUCOSE 204* 185*  BUN 13 12  CREATININE 0.67 0.71  CALCIUM 9.1 9.0   PT/INR No results found for this basename: LABPROT, INR,  in the last 72 hours Comprehensive Metabolic Panel:    Component Value Date/Time   NA 137 02/09/2013 0330   K 3.8 02/09/2013 0330   CL 101 02/09/2013 0330   CO2 27 02/09/2013 0330   BUN 12 02/09/2013 0330   CREATININE 0.71 02/09/2013 0330   GLUCOSE 185* 02/09/2013 0330   CALCIUM 9.0 02/09/2013 0330   AST 24 01/27/2013 1045   ALT 14 01/27/2013 1045   ALKPHOS 50 01/27/2013 1045   BILITOT 0.9 01/27/2013 1045   PROT 7.3 01/27/2013 1045   ALBUMIN 3.3* 01/27/2013 1045     Studies/Results: Dg Chest Port 1 View  02/09/2013   *RADIOLOGY REPORT*  Clinical Data: Atelectasis.  Evaluate endotracheal tube.  PORTABLE CHEST - 1 VIEW  Comparison: 02/08/2013.  Findings: Endotracheal tube terminates approximately 2.4 cm above the carina.  Heart size is grossly stable.  Lungs are very low in volume with bibasilar atelectasis.  There may  be small bilateral pleural effusions as well.  IMPRESSION: Low lung volumes with bibasilar atelectasis and probable small bilateral pleural effusions.   Original Report Authenticated By: Leanna Battles, M.D.   Dg Chest Port 1 View  02/08/2013   *RADIOLOGY REPORT*  Clinical Data: Intubation  PORTABLE CHEST - 1 VIEW  Comparison: 02/08/2013  Findings: Endotracheal tube is 3 cm above the carina.  Low lung volumes persist with basilar atelectasis.  No significant enlarging effusion or pneumothorax.  Previously described pneumoperitoneum beneath the right hemidiaphragm is not as apparent on today's study.  IMPRESSION: Endotracheal tube 3 cm above the carina.  Low volume exam with atelectasis   Original Report Authenticated By: Judie Petit. Miles Costain, M.D.   Dg Chest Port 1 View  02/08/2013   *RADIOLOGY REPORT*  Clinical Data: Atrial fibrillation with RVR, hypoxia, evaluate for CHF  PORTABLE CHEST - 1 VIEW  Comparison: 01/27/2013  Findings: Low lung volumes with vascular crowding.  No frank interstitial edema.  Bibasilar opacities, likely atelectasis. No pleural effusion or pneumothorax.  Cardiomegaly.  Pneumoperitoneum beneath the right hemidiaphragm, likely postoperative.  Cholecystectomy clips.  IMPRESSION: No frank interstitial edema.  Low lung volumes with bibasilar opacities, likely atelectasis.  Pneumoperitoneum beneath the right hemidiaphragm, likely postoperative.   Original Report Authenticated By: Charline Bills, M.D.  Anti-infectives: Anti-infectives   Start     Dose/Rate Route Frequency Ordered Stop   02/08/13 1700  ceFAZolin (ANCEF) IVPB 1 g/50 mL premix     1 g 100 mL/hr over 30 Minutes Intravenous Every 6 hours 02/08/13 1639 02/09/13 0539   02/08/13 0650  ceFAZolin (ANCEF) IVPB 2 g/50 mL premix     2 g 100 mL/hr over 30 Minutes Intravenous On call to O.R. 02/08/13 0650 02/08/13 0957      Assessment Principal Problem:   Hiatal hernia-recurrent s/p laparoscopic repair and Nissen fundoplication on  02/08/13 Active Problems:   Bell's palsy   GERD (gastroesophageal reflux disease)   Atrial fibrillation-in NSR and now off IV Amio   Acute respiratory failure-improved on vent.    LOS: 1 day   Plan: Hold on UGI until extubated.  Leave foley in for strict I & O.   Victor Sutton J 02/09/2013

## 2013-02-09 NOTE — Procedures (Signed)
Extubation Procedure Note  Patient Details:   Name: Victor Sutton DOB: Sep 27, 1927 MRN: [id number]   Airway Documentation:  Airway 7.5 mm (Active)  Secured at (cm) 25 cm 02/09/2013  8:04 AM  Measured From Lips 02/09/2013  8:04 AM  Secured Location Right 02/09/2013  8:04 AM  Secured By [employer] 02/09/2013  8:04 AM  Tube Holder Repositioned Yes 02/09/2013  7:48 AM  Site Condition Cool;Dry 02/09/2013 12:16 AM    Evaluation  O2 sats: stable throughout Complications: No apparent complications Patient did tolerate procedure well. Bilateral Breath Sounds: Clear;Diminished Suctioning: Airway Yes  Suzan Garibaldi 02/09/2013, 10:00 AM

## 2013-02-10 ENCOUNTER — Inpatient Hospital Stay (HOSPITAL_COMMUNITY): Payer: Medicare Other

## 2013-02-10 LAB — BASIC METABOLIC PANEL
BUN: 11 mg/dL (ref 6–23)
CO2: 30 mEq/L (ref 19–32)
Calcium: 8.8 mg/dL (ref 8.4–10.5)
Chloride: 103 mEq/L (ref 96–112)
Creatinine, Ser: 0.79 mg/dL (ref 0.50–1.35)
GFR calc Af Amer: >90 mL/min (ref >90)
GFR calc non Af Amer: 81 mL/min — ABNORMAL LOW (ref >90)
Glucose, Bld: 103 mg/dL — ABNORMAL HIGH (ref 70–99)
Potassium: 3.5 mEq/L (ref 3.5–5.1)
Sodium: 139 mEq/L (ref 135–145)

## 2013-02-10 LAB — GLUCOSE, CAPILLARY
Glucose-Capillary: 102 mg/dL — ABNORMAL HIGH (ref 70–99)
Glucose-Capillary: 87 mg/dL (ref 70–99)
Glucose-Capillary: 93 mg/dL (ref 70–99)
Glucose-Capillary: 94 mg/dL (ref 70–99)
Glucose-Capillary: 96 mg/dL (ref 70–99)
Glucose-Capillary: 99 mg/dL (ref 70–99)

## 2013-02-10 LAB — MAGNESIUM: Magnesium: 1.8 mg/dL (ref 1.5–2.5)

## 2013-02-10 LAB — PRO B NATRIURETIC PEPTIDE: Pro B Natriuretic peptide (BNP): 212.3 pg/mL (ref 0–450)

## 2013-02-10 LAB — PHOSPHORUS: Phosphorus: 2.8 mg/dL (ref 2.3–4.6)

## 2013-02-10 MED ORDER — LORAZEPAM 2 MG/ML IJ SOLN
0.5000 mg | INTRAMUSCULAR | Status: DC | PRN
  Administered 2013-02-10 – 2013-02-11 (×2): 0.5 mg via INTRAVENOUS
  Filled 2013-02-10 (×2): qty 1

## 2013-02-10 MED ORDER — METOPROLOL TARTRATE 1 MG/ML IV SOLN
2.5000 mg | INTRAVENOUS | Status: DC | PRN
  Administered 2013-02-10: 2.5 mg via INTRAVENOUS
  Filled 2013-02-10 (×2): qty 5

## 2013-02-10 MED ORDER — HEPARIN SODIUM (PORCINE) 5000 UNIT/ML IJ SOLN
5000.0000 [IU] | Freq: Three times a day (TID) | INTRAMUSCULAR | Status: DC
  Administered 2013-02-10 – 2013-02-24 (×42): 5000 [IU] via SUBCUTANEOUS
  Filled 2013-02-10 (×46): qty 1

## 2013-02-10 MED ORDER — LORAZEPAM 2 MG/ML IJ SOLN
0.5000 mg | Freq: Once | INTRAMUSCULAR | Status: AC
  Administered 2013-02-10: 0.5 mg via INTRAVENOUS
  Filled 2013-02-10: qty 1

## 2013-02-10 MED ORDER — IOHEXOL 300 MG/ML  SOLN
150.0000 mL | Freq: Once | INTRAMUSCULAR | Status: AC | PRN
  Administered 2013-02-10: 10:00:00 via ORAL

## 2013-02-10 NOTE — Progress Notes (Addendum)
PULMONARY  / CRITICAL CARE MEDICINE  Name: Victor Sutton MRN: [id number] DOB: [birth year]    ADMISSION DATE:  02/08/2013 CONSULTATION DATE:  02/08/13  REFERRING MD :  Okey Dupre PRIMARY SERVICE: Surgery  CHIEF COMPLAINT:  S/P hiatal hernia repair  BRIEF PATIENT DESCRIPTION: 79 year old male s/p open repair of hiatal hernia 02/08/13 admitted following respiratory failure and A-fib with RVR in PACU.  SIGNIFICANT EVENTS / STUDIES:  5/27 S/P open repair of hiatal hernia  LINES / TUBES: 5/27 ETT>>> 5/28  CULTURES: None pending  ANTIBIOTICS: None  HISTORY OF PRESENT ILLNESS:  Victor Sutton is a 79 year old male with a history of a recurrent hiatal hernia and reflux. He was taken to the OR 02/08/13 for an open repair of his hernia with a Nissen fundoplication. He tolerated the operation well and was take to PACU.  While in PACU he developed respiratory distress following extubation with A-fib and RVR. He received multiple agents to control his heart rhythm without success and was re-intubated.  He is admitted to the ICU for further management of his A-fib and the ventilator.  SUBJECTIVE:   02/10/2013: He went for a GI study with iohexol [Gastrografin] which is a water-soluble substance that can be used when there is concern for aspiration with barium but can cause chemical pneumonitis. He aspirated this and went into respiratory distress. Chest x-ray shows Gastrografin filling both lower lobe airways  VITAL SIGNS: Temp:  [97 F (36.1 C)-97.8 F (36.6 C)] 97.4 F (36.3 C) (05/29 0400) Pulse Rate:  [56-66] 65 (05/29 0800) Resp:  [13-24] 20 (05/29 0800) BP: (135-150)/(62-93) 141/73 mmHg (05/29 0800) SpO2:  [93 %-100 %] 97 % (05/29 0800) Weight:  [92.1 kg (203 lb 0.7 oz)] 92.1 kg (203 lb 0.7 oz) (05/29 0400)  INTAKE / OUTPUT: Intake/Output     05/28 0701 - 05/29 0700 05/29 0701 - 05/30 0700   I.V. (mL/kg) 2077.4 (22.6)    Other 330    IV Piggyback     Total Intake(mL/kg) 2407.4 (26.1)     Urine (mL/kg/hr) 1720 (0.8)    Blood     Total Output 1720     Net +687.4            PHYSICAL EXAMINATION: General:  Elderly male,  Neuro:  Awake and able to follow commands HEENT:  PERRL, EOM intact, cough and gag present Cardiovascular:  RRR, S1 and S2, no JVD, +2 radial and DP pulses. No carotid bruits. Lungs:  CTA bilateral lung fields anterior Abdomen:  Round, soft, non-tender. Bowel sounds hypoactive Musculoskeletal:  MAE well and equal. Muscle strength is 5/5 bilateral upper and lower extremities Skin:  Skin is intact aside from surgical incisions   Variable  0 Points 1 Point 2 Points Total  Heart rate per minute  <90 beats 90-109 beats >110 beats 1  Respiratory  Rate per minute < 18 breaths 19-30 breaths  >30 breaths 1  Restlessness; nonpurposeful movements None  occas slight movement Frequent movement 0  Paradoxical breathing pattern: None  Present 2  Accessory muscle use: rise in clavicle during inspiration None Slight rise Pronounced rise 1  Grunting at end-expiration: guttural sound None  Present 0  Nasal flaring: involuntary movement of nares None  Present 1  Look of fear None  Eyes wide 0  Overall total out of 16    6    Respiratory Distress Observation Scale Journal of Palliative Medicine Vol. 13, Number 3, 2010 Campbell et al.  LABS:  Recent Labs Lab 02/08/13 1643 02/09/13 0330 02/10/13 0340  NA 142 137 139  K 3.3* 3.8 3.5  CL 101 101 103  CO2 22 27 30   BUN 13 12 11   CREATININE 0.67 0.71 0.79  GLUCOSE 204* 185* 103*    Recent Labs Lab 02/08/13 1641 02/09/13 0330  HGB 13.7 13.1  HCT 42.2 39.9  WBC 15.9* 11.4*  PLT 282 269     ASSESSMENT / PLAN:  PULMONARY A: Acute respiratory failure s/p surgical repair of hiatal hernia.  Noted to have Rt hemidiaphragm elevation on AP film after intubation ?significance. Contrast Aspiration (during swallow evaluation)  P:   -Continue IS and pulmonary toilet  -Bipap for respiratory  distress -serial chest x-ray -Bronch with lavage if contrast casts do not resolve on x-ray  CARDIOVASCULAR A: A-flutter with RVR-->now in Sinus  Need to r/o demand ischemia  P:  -Will continue amiodarone per cardiology recommendations   RENAL A:  No active issues, nml renal fxn at BL: cr 0.73 P:   -f/u bmet and replace electrolytes as needed -follow I&O trend  GASTROINTESTINAL A:  S/P repair of hiatal hernia and Nissen Fundoplication P:   -Maintain NPO -Protonix for SUP -Follow surgery recs  HEMATOLOGIC A:  No active issues, bl hgb 14.6 P:  -Follow CBC  INFECTIOUS A:  No active issues P:   -Follow WBC -No current indication for antibiotic therapy  ENDOCRINE A:  No active issues   P:   -Follow CBG  NEUROLOGIC A:  Awake and following commands P:   -Morphine for pain control  TODAY'S SUMMARY:  Victor Sutton had witnessed aspiration of gastrograffin with radiology evaluation of swallowing. Plan for Bipap for respiratory distress. Will check a repeat chest x-ray to see if contrast is resolving. If there is no resolution he may need bronch for lavage.   Note by Kathrin Greathouse NP student and Roslynn Amble NP    STAFF NOTE: I, Dr Lavinia Sharps have personally reviewed patient's available data, including medical history, events of note, physical examination and test results as part of my evaluation. I have discussed with resident/NP and other care providers such as pharmacist, RN and RRT.  In addition,  I personally evaluated patient and elicited key findings of acute respiratory failure following aspiration of Gastrografin [iohexol]. We discussed this with radiology and surgery. Her understanding is that this is a water-soluble substance but we'll put patient at risk for chemical pneumonitis. Therefore we will try to a patient with BiPAP with the hope that iohexol be absorbed to the bloodstream and excreted through the urine/stool. We will repeat chest x-ray in a few hours and  watch his course. He will have to be n.p.o. If it looks like the Gastrografin as not being absorbed then he will bronchial lavage with flexible bronchoscopy with video bronchoscope with her without intubation for the procedure. Of note, given his risk for lung injury we will stop his amiodarone infusion because is in sinus rhythm right now; will inform cardiology.  Rest per NP/medical resident whose note is outlined above and that I agree with       Dr. Kalman Shan, M.D., [facility]C.P Pulmonary and Critical Care Medicine Staff Physician [facility] Pulmonary and Critical Care Pager: [phone], If no answer or between  15:00h - 7:00h: call [phone]  02/10/2013 9:38 AM

## 2013-02-10 NOTE — Progress Notes (Signed)
2 Days Post-Op  Subjective: Some left shoulder pain.  Mild dyspnea.  No chest pain.  No abdominal pain.  Objective: Vital signs in last 24 hours: Temp:  [97 F (36.1 C)-97.8 F (36.6 C)] 97.4 F (36.3 C) (05/29 0400) Pulse Rate:  [56-66] 62 (05/29 0700) Resp:  [13-24] 15 (05/29 0700) BP: (135-150)/(62-93) 141/62 mmHg (05/29 0700) SpO2:  [93 %-100 %] 97 % (05/29 0700) FiO2 (%):  [40 %] 40 % (05/28 0748) Weight:  [203 lb 0.7 oz (92.1 kg)] 203 lb 0.7 oz (92.1 kg) (05/29 0400)    Intake/Output from previous day: 05/28 0701 - 05/29 0700 In: 2305.7 [I.V.:1985.7] Out: 1295 [Urine:1295] Intake/Output this shift:    PE: General- In NAD CV-RRR Abdomen-soft, nontender, dressings dry  Lab Results:   Recent Labs  02/08/13 1641 02/09/13 0330  WBC 15.9* 11.4*  HGB 13.7 13.1  HCT 42.2 39.9  PLT 282 269   BMET  Recent Labs  02/09/13 0330 02/10/13 0340  NA 137 139  K 3.8 3.5  CL 101 103  CO2 27 30  GLUCOSE 185* 103*  BUN 12 11  CREATININE 0.71 0.79  CALCIUM 9.0 8.8   PT/INR No results found for this basename: LABPROT, INR,  in the last 72 hours Comprehensive Metabolic Panel:    Component Value Date/Time   NA 139 02/10/2013 0340   K 3.5 02/10/2013 0340   CL 103 02/10/2013 0340   CO2 30 02/10/2013 0340   BUN 11 02/10/2013 0340   CREATININE 0.79 02/10/2013 0340   GLUCOSE 103* 02/10/2013 0340   CALCIUM 8.8 02/10/2013 0340   AST 24 01/27/2013 1045   ALT 14 01/27/2013 1045   ALKPHOS 50 01/27/2013 1045   BILITOT 0.9 01/27/2013 1045   PROT 7.3 01/27/2013 1045   ALBUMIN 3.3* 01/27/2013 1045     Studies/Results: Dg Chest Port 1 View  02/09/2013   *RADIOLOGY REPORT*  Clinical Data: Atelectasis.  Evaluate endotracheal tube.  PORTABLE CHEST - 1 VIEW  Comparison: 02/08/2013.  Findings: Endotracheal tube terminates approximately 2.4 cm above the carina.  Heart size is grossly stable.  Lungs are very low in volume with bibasilar atelectasis.  There may be small bilateral pleural  effusions as well.  IMPRESSION: Low lung volumes with bibasilar atelectasis and probable small bilateral pleural effusions.   Original Report Authenticated By: Leanna Battles, M.D.   Dg Chest Port 1 View  02/08/2013   *RADIOLOGY REPORT*  Clinical Data: Intubation  PORTABLE CHEST - 1 VIEW  Comparison: 02/08/2013  Findings: Endotracheal tube is 3 cm above the carina.  Low lung volumes persist with basilar atelectasis.  No significant enlarging effusion or pneumothorax.  Previously described pneumoperitoneum beneath the right hemidiaphragm is not as apparent on today's study.  IMPRESSION: Endotracheal tube 3 cm above the carina.  Low volume exam with atelectasis   Original Report Authenticated By: Judie Petit. Miles Costain, M.D.   Dg Chest Port 1 View  02/08/2013   *RADIOLOGY REPORT*  Clinical Data: Atrial fibrillation with RVR, hypoxia, evaluate for CHF  PORTABLE CHEST - 1 VIEW  Comparison: 01/27/2013  Findings: Low lung volumes with vascular crowding.  No frank interstitial edema.  Bibasilar opacities, likely atelectasis. No pleural effusion or pneumothorax.  Cardiomegaly.  Pneumoperitoneum beneath the right hemidiaphragm, likely postoperative.  Cholecystectomy clips.  IMPRESSION: No frank interstitial edema.  Low lung volumes with bibasilar opacities, likely atelectasis.  Pneumoperitoneum beneath the right hemidiaphragm, likely postoperative.   Original Report Authenticated By: Charline Bills, M.D.    Anti-infectives: Anti-infectives  Start     Dose/Rate Route Frequency Ordered Stop   02/08/13 1700  ceFAZolin (ANCEF) IVPB 1 g/50 mL premix     1 g 100 mL/hr over 30 Minutes Intravenous Every 6 hours 02/08/13 1639 02/09/13 0539   02/08/13 0650  ceFAZolin (ANCEF) IVPB 2 g/50 mL premix     2 g 100 mL/hr over 30 Minutes Intravenous On call to O.R. 02/08/13 0650 02/08/13 0957      Assessment Principal Problem:   Hiatal hernia-recurrent s/p laparoscopic repair and Nissen fundoplication on 02/08/13 Active  Problems:   Bell's palsy   GERD (gastroesophageal reflux disease)   Atrial fibrillation-in NSR on IV Amio   Acute respiratory failure-extubated and tolerating it well.    LOS: 2 days   Plan: UGI today.  Start [place] Heparin.  SDU status.   Kayson Tasker J 02/10/2013

## 2013-02-10 NOTE — Progress Notes (Signed)
   Subjective:  Denies CP or dyspnea; mild abdominal discomfort   Objective:  Filed Vitals:   02/10/13 0200 02/10/13 0300 02/10/13 0400 02/10/13 0500  BP: 141/76 137/67 138/67 146/72  Pulse: 61 61 62 63  Temp:   97.4 F (36.3 C)   TempSrc:   Oral   Resp: 14 24 14 20   Height:      Weight:   203 lb 0.7 oz (92.1 kg)   SpO2: 98% 98% 97% 98%    Intake/Output from previous day:  Intake/Output Summary (Last 24 hours) at 02/10/13 1610 Last data filed at 02/10/13 0500  Gross per 24 hour  Intake   2304 ml  Output   1295 ml  Net   1009 ml    Physical Exam: Physical exam: Well-developed well-nourished in no acute distress.  Skin is warm and dry.  HEENT is normal.  Neck is supple.  Chest with diminished BS bases Cardiovascular exam is regular rate and rhythm.  Abdominal exam s/p abdominal surgery Extremities show no edema. neuro grossly intact    Lab Results: Basic Metabolic Panel:  Recent Labs  96/04/54 1641  02/09/13 0330 02/10/13 0340  NA  --   < > 137 139  K  --   < > 3.8 3.5  CL  --   < > 101 103  CO2  --   < > 27 30  GLUCOSE  --   < > 185* 103*  BUN  --   < > 12 11  CREATININE  --   < > 0.71 0.79  CALCIUM  --   < > 9.0 8.8  MG  --   < > 1.8 1.8  PHOS 3.4  --   --  2.8  < > = values in this interval not displayed. CBC:  Recent Labs  02/08/13 1641 02/09/13 0330  WBC 15.9* 11.4*  HGB 13.7 13.1  HCT 42.2 39.9  MCV 87.6 84.9  PLT 282 269   Cardiac Enzymes:  Recent Labs  02/08/13 1642 02/08/13 2101 02/09/13 0330  TROPONINI <0.30 <0.30 <0.30     Assessment/Plan:  1 postoperative atrial flutter-the patient remains in sinus rhythm. Continue amiodarone (convert to po when stable - 400 mg po daily for 2 weeks, then 200 mg po daily) and continue for 6-8 weeks; if no further atrial flutter, DC at that time. Atrial arrhythmias most likely related to hyperadrenergic state associated with surgery. No anticoagulation given recent surgery. Note LV function  normal. 2 Status post abdominal surgery-management per general surgery. 3 VDRF-resolved.  Olga Millers 02/10/2013, 7:22 AM

## 2013-02-10 NOTE — Progress Notes (Signed)
SLP Cancellation Note  Patient Details Name: Victor Sutton MRN: [id number] DOB: 03/11/1928   Cancelled treatment:        Pt. Now on bipap s/p aspiration of gastrografin during UGI while reclined/supine.  Pt. Went into respiratory distress.  Will defer bedside swallow evaluation.  Pt. Will need a MBS (objective study) prior to initiating a diet.  Please order MBS when pt. Is off bipap and is able to participate.    Maryjo Rochester T 02/10/2013, 3:20 PM

## 2013-02-10 NOTE — Progress Notes (Signed)
He aspirated some iohexol during the phase of the UGI where he was not erect.  He stated that he swallowed the contrast okay when he was erect.  He has had some respiratory distress since the event.  CXR demonstrates aspirated contrast within bilateral lower lobe segmental and subsegmental bronchi along with bilateral atelectasis.  Contrast in mid and distal esophagus is present.  Will treat with supportive pulmonary care for now.

## 2013-02-10 NOTE — Progress Notes (Signed)
2030 - pt dropped O2 sats into 70s on 6L [place]. Called respiratory, BiPAP reinitiated at 100% with 10 PEEP. 0.5mg  ativan administered. Pt's sats increased to low 90s. Elink contacted via in room camera and made aware. Elink MD viewed pt and instructed to continue monitoring. Will continue to monitor.  Langley Gauss, RN

## 2013-02-11 ENCOUNTER — Inpatient Hospital Stay (HOSPITAL_COMMUNITY): Payer: Medicare Other

## 2013-02-11 DIAGNOSIS — J9600 Acute respiratory failure, unspecified whether with hypoxia or hypercapnia: Secondary | ICD-10-CM

## 2013-02-11 DIAGNOSIS — E43 Unspecified severe protein-calorie malnutrition: Secondary | ICD-10-CM | POA: Diagnosis present

## 2013-02-11 LAB — BLOOD GAS, ARTERIAL
Acid-Base Excess: 7.8 mmol/L — ABNORMAL HIGH (ref 0.0–2.0)
Acid-Base Excess: 7.6 mmol/L — ABNORMAL HIGH (ref 0.0–2.0)
Bicarbonate: 30.8 mEq/L — ABNORMAL HIGH (ref 20.0–24.0)
Bicarbonate: 30.5 mEq/L — ABNORMAL HIGH (ref 20.0–24.0)
Drawn by: 31057
FIO2: 1 %
FIO2: 0.4 %
MECHVT: 620 mL
MECHVT: 550 mL
O2 Saturation: 99.6 %
O2 Saturation: 94.3 %
PEEP: 5 cmH2O
PEEP: 5 cmH2O
Patient temperature: 37
Patient temperature: 37
RATE: 18 resp/min
RATE: 15 resp/min
TCO2: 26.4 mmol/L (ref 0–100)
TCO2: 25.8 mmol/L (ref 0–100)
pCO2 arterial: 37.2 mmHg (ref 35.0–45.0)
pCO2 arterial: 37 mmHg (ref 35.0–45.0)
pH, Arterial: 7.528 — ABNORMAL HIGH (ref 7.350–7.450)
pH, Arterial: 7.526 — ABNORMAL HIGH (ref 7.350–7.450)
pO2, Arterial: 281 mmHg — ABNORMAL HIGH (ref 80.0–100.0)
pO2, Arterial: 61.3 mmHg — ABNORMAL LOW (ref 80.0–100.0)

## 2013-02-11 LAB — BASIC METABOLIC PANEL
BUN: 7 mg/dL (ref 6–23)
CO2: 32 mEq/L (ref 19–32)
Calcium: 8.4 mg/dL (ref 8.4–10.5)
Chloride: 100 mEq/L (ref 96–112)
Creatinine, Ser: 0.59 mg/dL (ref 0.50–1.35)
GFR calc Af Amer: >90 mL/min (ref >90)
GFR calc non Af Amer: >90 mL/min (ref >90)
Glucose, Bld: 112 mg/dL — ABNORMAL HIGH (ref 70–99)
Potassium: 3.2 mEq/L — ABNORMAL LOW (ref 3.5–5.1)
Sodium: 138 mEq/L (ref 135–145)

## 2013-02-11 LAB — CBC
HCT: 38.4 % — ABNORMAL LOW (ref 39.0–52.0)
Hemoglobin: 12.1 g/dL — ABNORMAL LOW (ref 13.0–17.0)
MCH: 27.6 pg (ref 26.0–34.0)
MCHC: 31.5 g/dL (ref 30.0–36.0)
MCV: 87.5 fL (ref 78.0–100.0)
Platelets: 240 10*3/uL (ref 150–400)
RBC: 4.39 MIL/uL (ref 4.22–5.81)
RDW: 17.1 % — ABNORMAL HIGH (ref 11.5–15.5)
WBC: 11.5 10*3/uL — ABNORMAL HIGH (ref 4.0–10.5)

## 2013-02-11 LAB — URINALYSIS, ROUTINE W REFLEX MICROSCOPIC
Glucose, UA: NEGATIVE mg/dL
Hgb urine dipstick: NEGATIVE
Ketones, ur: NEGATIVE mg/dL
Nitrite: NEGATIVE
Protein, ur: NEGATIVE mg/dL
Specific Gravity, Urine: 1.022 (ref 1.005–1.030)
Urobilinogen, UA: 4 mg/dL — ABNORMAL HIGH (ref 0.0–1.0)
pH: 6 (ref 5.0–8.0)

## 2013-02-11 LAB — URINE MICROSCOPIC-ADD ON

## 2013-02-11 LAB — GLUCOSE, CAPILLARY
Glucose-Capillary: 110 mg/dL — ABNORMAL HIGH (ref 70–99)
Glucose-Capillary: 113 mg/dL — ABNORMAL HIGH (ref 70–99)
Glucose-Capillary: 118 mg/dL — ABNORMAL HIGH (ref 70–99)
Glucose-Capillary: 119 mg/dL — ABNORMAL HIGH (ref 70–99)
Glucose-Capillary: 136 mg/dL — ABNORMAL HIGH (ref 70–99)
Glucose-Capillary: 99 mg/dL (ref 70–99)

## 2013-02-11 MED ORDER — AMIODARONE IV BOLUS ONLY 150 MG/100ML
150.0000 mg | Freq: Once | INTRAVENOUS | Status: AC
  Administered 2013-02-11: 150 mg via INTRAVENOUS

## 2013-02-11 MED ORDER — FENTANYL CITRATE 0.05 MG/ML IJ SOLN
25.0000 ug | INTRAMUSCULAR | Status: DC | PRN
  Administered 2013-02-11 (×3): 50 ug via INTRAVENOUS
  Filled 2013-02-11 (×2): qty 2

## 2013-02-11 MED ORDER — ROCURONIUM BROMIDE 50 MG/5ML IV SOLN
INTRAVENOUS | Status: AC
  Filled 2013-02-11: qty 2

## 2013-02-11 MED ORDER — ACETAMINOPHEN 650 MG RE SUPP: 650.0000 mg | RECTAL | Status: DC | PRN

## 2013-02-11 MED ORDER — SUCCINYLCHOLINE CHLORIDE 20 MG/ML IJ SOLN
INTRAMUSCULAR | Status: AC
  Filled 2013-02-11: qty 1

## 2013-02-11 MED ORDER — METOPROLOL TARTRATE 1 MG/ML IV SOLN
2.5000 mg | INTRAVENOUS | Status: DC | PRN
Start: 2013-02-11 — End: 2013-02-15
  Administered 2013-02-14: 2.5 mg via INTRAVENOUS
  Filled 2013-02-11: qty 5

## 2013-02-11 MED ORDER — VANCOMYCIN HCL IN DEXTROSE 750-5 MG/150ML-% IV SOLN
750.0000 mg | Freq: Two times a day (BID) | INTRAVENOUS | Status: DC
  Administered 2013-02-11: 750 mg via INTRAVENOUS
  Filled 2013-02-11 (×2): qty 150

## 2013-02-11 MED ORDER — CHLORHEXIDINE GLUCONATE 0.12 % MT SOLN
15.0000 mL | Freq: Two times a day (BID) | OROMUCOSAL | Status: DC
  Administered 2013-02-11 – 2013-02-24 (×24): 15 mL via OROMUCOSAL
  Filled 2013-02-11 (×27): qty 15

## 2013-02-11 MED ORDER — METOPROLOL TARTRATE 1 MG/ML IV SOLN
2.5000 mg | Freq: Three times a day (TID) | INTRAVENOUS | Status: DC
  Administered 2013-02-11: 2.5 mg via INTRAVENOUS

## 2013-02-11 MED ORDER — SODIUM CHLORIDE 0.9 % IV SOLN
10.0000 ug/h | INTRAVENOUS | Status: DC
  Administered 2013-02-11: 10 ug/h via INTRAVENOUS
  Administered 2013-02-12: 25 ug/h via INTRAVENOUS
  Filled 2013-02-11: qty 50

## 2013-02-11 MED ORDER — MIDAZOLAM HCL 2 MG/2ML IJ SOLN
1.0000 mg | INTRAMUSCULAR | Status: DC | PRN
  Administered 2013-02-11 (×4): 2 mg via INTRAVENOUS
  Filled 2013-02-11: qty 4
  Filled 2013-02-11: qty 2

## 2013-02-11 MED ORDER — DEXTROSE-NACL 5-0.9 % IV SOLN: INTRAVENOUS | Status: DC

## 2013-02-11 MED ORDER — DEXTROSE-NACL 5-0.9 % IV SOLN
INTRAVENOUS | Status: AC
  Administered 2013-02-11: 14:00:00 via INTRAVENOUS

## 2013-02-11 MED ORDER — ALBUTEROL SULFATE HFA 108 (90 BASE) MCG/ACT IN AERS: 4.0000 | INHALATION_SPRAY | RESPIRATORY_TRACT | Status: DC | PRN

## 2013-02-11 MED ORDER — SODIUM CHLORIDE 0.9 % IV SOLN
100.0000 mg | Freq: Every day | INTRAVENOUS | Status: DC
  Administered 2013-02-12 (×2): 100 mg via INTRAVENOUS
  Filled 2013-02-11 (×3): qty 100

## 2013-02-11 MED ORDER — ACETAMINOPHEN 650 MG RE SUPP
RECTAL | Status: AC
  Administered 2013-02-11: 650 mg via RECTAL
  Filled 2013-02-11: qty 1

## 2013-02-11 MED ORDER — MIDAZOLAM HCL 2 MG/2ML IJ SOLN
INTRAMUSCULAR | Status: AC
  Filled 2013-02-11: qty 4

## 2013-02-11 MED ORDER — TRACE MINERALS CR-CU-F-FE-I-MN-MO-SE-ZN IV SOLN
INTRAVENOUS | Status: AC
  Administered 2013-02-11: 18:00:00 via INTRAVENOUS
  Filled 2013-02-11: qty 1000

## 2013-02-11 MED ORDER — AMIODARONE HCL IN DEXTROSE 360-4.14 MG/200ML-% IV SOLN
INTRAVENOUS | Status: AC
  Administered 2013-02-11: 200 mL
  Filled 2013-02-11: qty 200

## 2013-02-11 MED ORDER — FUROSEMIDE 10 MG/ML IJ SOLN
40.0000 mg | Freq: Once | INTRAMUSCULAR | Status: AC
  Administered 2013-02-11: 40 mg via INTRAVENOUS
  Filled 2013-02-11: qty 4

## 2013-02-11 MED ORDER — FAT EMULSION 20 % IV EMUL
250.0000 mL | INTRAVENOUS | Status: AC
  Administered 2013-02-11: 250 mL via INTRAVENOUS
  Filled 2013-02-11: qty 250

## 2013-02-11 MED ORDER — LIDOCAINE HCL (CARDIAC) 20 MG/ML IV SOLN
INTRAVENOUS | Status: AC
  Filled 2013-02-11: qty 5

## 2013-02-11 MED ORDER — ALBUTEROL SULFATE HFA 108 (90 BASE) MCG/ACT IN AERS
4.0000 | INHALATION_SPRAY | RESPIRATORY_TRACT | Status: DC
  Filled 2013-02-11: qty 6.7

## 2013-02-11 MED ORDER — AMIODARONE IV BOLUS ONLY 150 MG/100ML
INTRAVENOUS | Status: AC
  Filled 2013-02-11: qty 100

## 2013-02-11 MED ORDER — SODIUM CHLORIDE 0.9 % IV BOLUS (SEPSIS)
500.0000 mL | Freq: Once | INTRAVENOUS | Status: AC
  Administered 2013-02-11: 500 mL via INTRAVENOUS

## 2013-02-11 MED ORDER — PHENYLEPHRINE HCL 10 MG/ML IJ SOLN
30.0000 ug/min | INTRAVENOUS | Status: DC
  Administered 2013-02-11: 30 ug/min via INTRAVENOUS
  Filled 2013-02-11 (×2): qty 1

## 2013-02-11 MED ORDER — FENTANYL CITRATE 0.05 MG/ML IJ SOLN
INTRAMUSCULAR | Status: AC
  Administered 2013-02-11: 100 ug
  Filled 2013-02-11: qty 4

## 2013-02-11 MED ORDER — INSULIN ASPART 100 UNIT/ML ~~LOC~~ SOLN
0.0000 [IU] | SUBCUTANEOUS | Status: DC
  Administered 2013-02-11: 1 [IU] via SUBCUTANEOUS
  Administered 2013-02-12: 2 [IU] via SUBCUTANEOUS
  Administered 2013-02-12 – 2013-02-13 (×6): 1 [IU] via SUBCUTANEOUS
  Administered 2013-02-13: 2 [IU] via SUBCUTANEOUS
  Administered 2013-02-13 – 2013-02-20 (×28): 1 [IU] via SUBCUTANEOUS

## 2013-02-11 MED ORDER — BIOTENE DRY MOUTH MT LIQD
15.0000 mL | Freq: Four times a day (QID) | OROMUCOSAL | Status: DC
  Administered 2013-02-12 – 2013-02-21 (×32): 15 mL via OROMUCOSAL

## 2013-02-11 MED ORDER — POTASSIUM CHLORIDE 10 MEQ/100ML IV SOLN
10.0000 meq | INTRAVENOUS | Status: AC
  Administered 2013-02-11 (×4): 10 meq via INTRAVENOUS
  Filled 2013-02-11: qty 400

## 2013-02-11 MED ORDER — PIPERACILLIN-TAZOBACTAM 3.375 G IVPB
3.3750 g | Freq: Three times a day (TID) | INTRAVENOUS | Status: DC
  Administered 2013-02-11 – 2013-02-14 (×8): 3.375 g via INTRAVENOUS
  Filled 2013-02-11 (×11): qty 50

## 2013-02-11 MED ORDER — ETOMIDATE 2 MG/ML IV SOLN
INTRAVENOUS | Status: AC
  Administered 2013-02-11: 20 mg
  Filled 2013-02-11: qty 20

## 2013-02-11 NOTE — Procedures (Signed)
Intubation Procedure Note DEVION CHRISCOE [id number] Oct 19, 1927  Procedure: Intubation Indications: Respiratory insufficiency  Procedure Details Consent: Unable to obtain consent because of altered level of consciousness. Time Out: Verified patient identification, verified procedure, site/side was marked, verified correct patient position, special equipment/implants available, medications/allergies/relevent history reviewed, required imaging and test results available.  Performed  Maximum sterile technique was used including gloves, hand hygiene and mask.  MAC and 3    Evaluation Hemodynamic Status: Persistent hypotension treated with pressors and fluid; O2 sats: stable throughout Patient's Current Condition: unstable Complications: No apparent complications Patient did tolerate procedure well. Chest X-ray ordered to verify placement.  CXR: tube position acceptable.   Brenton Grills Hocutt S-ACNP 02/11/2013  I was present for procedure.  Coralyn Helling, MD [facility] Pulmonary/Critical Care 02/11/2013, 2:22 PM Pager:  [phone] After 3pm call: [phone]

## 2013-02-11 NOTE — Progress Notes (Signed)
ANTIBIOTIC CONSULT NOTE - INITIAL  Pharmacy Consult for Vancomycin/Zosyn Indication: Aspiration PNA/HCAP  No Known Allergies  Patient Measurements: Height: 5\' 10"  (177.8 cm) Weight: 201 lb 11.5 oz (91.5 kg) IBW/kg (Calculated) : 73   Vital Signs: Temp: 102.2 F (39 C) (05/30 2000) Temp src: Axillary (05/30 2000) BP: 119/61 mmHg (05/30 1954) Pulse Rate: 77 (05/30 1954) Intake/Output from previous day: 05/29 0701 - 05/30 0700 In: 2030.2 [I.V.:1750.2; IV Piggyback:200] Out: 1770 [Urine:1770] Intake/Output from this shift: Total I/O In: 225 [I.V.:105; TPN:120] Out: 90 [Urine:90]  Labs:  Recent Labs  02/09/13 0330 02/10/13 0340 02/11/13 0333  WBC 11.4*  --  11.5*  HGB 13.1  --  12.1*  PLT 269  --  240  CREATININE 0.71 0.79 0.59   Estimated Creatinine Clearance: 81 ml/min (by C-G formula based on Cr of 0.59). No results found for this basename: VANCOTROUGH, Leodis Binet, VANCORANDOM, GENTTROUGH, GENTPEAK, GENTRANDOM, TOBRATROUGH, TOBRAPEAK, TOBRARND, AMIKACINPEAK, AMIKACINTROU, AMIKACIN,  in the last 72 hours   Microbiology: Recent Results (from the past 720 hour(s))  SURGICAL PCR SCREEN     Status: None   Collection Time    01/27/13 10:35 AM      Result Value Range Status   MRSA, PCR NEGATIVE  NEGATIVE Final   Staphylococcus aureus NEGATIVE  NEGATIVE Final   Comment:            The Xpert SA Assay (FDA     approved for NASAL specimens     in patients over 56 years of age),     is one component of     a comprehensive surveillance     program.  Test performance has     been validated by [employer] for patients greater     than or equal to 35 year old.     It is not intended     to diagnose infection nor to     guide or monitor treatment.    Medical History: Past Medical History  Diagnosis Date  . BPH (benign prostatic hyperplasia)   . Macular degeneration     (L) wet, (R) dry  . B12 deficiency     HIstory  . 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  Leukoplakia 2001    Treated for  . Hypertension   . Iron deficiency anemia   . Cataract 2009    bilateral cataract surgery  . GERD (gastroesophageal reflux disease)   . Hyperlipidemia     not on medication  . Hiatal hernia   . Peripheral neuropathy     Mild History of  . Ulcer   . Bell's palsy     at present    Medications:  Scheduled:  . [START ON 02/12/2013] antiseptic oral rinse  15 mL Mouth Rinse QID  . chlorhexidine  15 mL Mouth Rinse BID  . heparin subcutaneous  5,000 Units Subcutaneous Q8H  . insulin aspart  0-9 Units Subcutaneous Q4H  . lidocaine (cardiac) 100 mg/83ml      . micafungin (MYCAMINE) IV  100 mg Intravenous QHS  . midazolam      . pantoprazole (PROTONIX) IV  40 mg Intravenous QHS  . piperacillin-tazobactam (ZOSYN)  IV  3.375 g Intravenous Q8H  . rocuronium      . succinylcholine      . vancomycin  750 mg Intravenous Q12H   Infusions:  . dextrose 5 % and 0.9% NaCl 35 mL/hr at 02/11/13 1800  . Marland KitchenTPN (CLINIMIX-E) Adult  40 mL/hr at 02/11/13 1757   And  . fat emulsion 250 mL (02/11/13 1757)  . fentaNYL infusion INTRAVENOUS 10 mcg/hr (02/11/13 2153)  . phenylephrine (NEO-SYNEPHRINE) Adult infusion 20 mcg/min (02/11/13 1900)   Assessment: 79 yo with recurrent episodes of aspiration, currently on TNA. MD ordering Vancomycin and Zosyn for possible aspiration PNA/HCAP.  Goal of Therapy:  Vancomycin trough level 15-20 mcg/ml  Plan:   Zosyn 3.375 Gm IV q8h (EI infusion)  Vancomycin 750 mg IV q12h.  CrCl~58 Dorris Carnes)  Susanne Greenhouse R 02/11/2013,10:02 PM

## 2013-02-11 NOTE — Procedures (Signed)
Central Venous Catheter Insertion Procedure Note ELIAKIM TENDLER [id number] 1928/01/02  Procedure: Insertion of Central Venous Catheter Indications: Assessment of intravascular volume, Drug and/or fluid administration and Frequent blood sampling  Procedure Details Consent: Unable to obtain consent because of altered level of consciousness. Time Out: Verified patient identification, verified procedure, site/side was marked, verified correct patient position, special equipment/implants available, medications/allergies/relevent history reviewed, required imaging and test results available.  Performed  Maximum sterile technique was used including antiseptics, cap, gloves, gown, hand hygiene, mask and sheet. Skin prep: Chlorhexidine; local anesthetic administered A antimicrobial bonded/coated triple lumen catheter was placed in the right internal jugular vein using the Seldinger technique.  Evaluation Blood flow good Complications: No apparent complications Patient did tolerate procedure well. Chest X-ray ordered to verify placement.  CXR: normal.  Brenton Grills Hocutt S-ACNP 02/11/2013, 1:43 PM  I was present for procedure.  Coralyn Helling, MD [facility] Pulmonary/Critical Care 02/11/2013, 2:22 PM Pager:  [phone] After 3pm call: [phone]

## 2013-02-11 NOTE — Progress Notes (Signed)
SLP Cancellation Note  Patient Details Name: Victor Sutton MRN: [id number] DOB: Aug 28, 1928   Cancelled treatment:        Discussed case with Dr. Marchelle Gearing re: aspiration of gastrografin and need for MBS prior to attempting po's.  Pt. On face mask O2 currently.  Pulmonology defers timing of MBS to Dr. Abbey Chatters.  SLP will place pt. "on-hold" until new orders received.     Victor Sutton 02/11/2013, 10:52 AM

## 2013-02-11 NOTE — Procedures (Signed)
Bronchoscopy Procedure Note Victor Sutton [id number] 12-Nov-1927  Indication: 79 yo male with hiatal hernia s/p Nissen fundoplication with respiratory failure from recurrent aspiration.  Pre-operative diagnosis: Aspiration pneumonitis  Post-operative diagnosis: Aspiration pneumonitis  Procedure: Bronchoscopy, aspiration of secretions, BAL rt lower lobe.  Technique: Patient intubated earlier today.    He was given 3 mg versed and 50 mcg fentanyl for sedation and analgesia.  Bronchoscope entered through ETT.  Carina visualized with ETT in good position.  Mucosa appeared inflamed throughout.    Bronchoscope entered into left main bronchus.  Left upper, lingular, and lower lobe orifices visualized.  No endobronchial lesions.  Thick clear to yellow secretions eminating from left lower lobe easily suctioned after instillation of saline.  Bronchoscope entered into right main bronchus.  Right upper, middle, and lower lobe orifices visualized.  No endobronchial lesions.  Thick clear to yellow secretions eminating from right lower lobe >> greater than seen on left.  After instillation of saline secretions cleared from right lower lobe.  Bronchoscope wedged into right lower lobe.  Instilled 40 ml of saline with 10 ml of clear fluid returned.  No obvious complications.  Bronchoscope withdrawn.  Will send right lower lobe BAL for gram stain and culture.  Coralyn Helling, MD [facility] Pulmonary/Critical Care 02/11/2013, 2:21 PM Pager:  [phone] After 3pm call: [phone]

## 2013-02-11 NOTE — Progress Notes (Signed)
eLink Physician-Brief Progress Note Patient Name: Victor Sutton DOB: [birth year] MRN: [id number]  Date of Service  02/11/2013   HPI/Events of Note  Requiring frequent int sedation   eICU Interventions  Fent gtt   Intervention Category Minor Interventions: Agitation / anxiety - evaluation and management  ALVA,RAKESH V. 02/11/2013, 8:34 PM

## 2013-02-11 NOTE — Significant Event (Signed)
Pt placed in supine position for PICC line insertion.  Noted to have paradoxical breathing pattern, and decreased mentation.  This was associated with hypoxia and a fib with RVR.  Concern for recurrent aspiration of stomach contents.  Pt was intubated, and given amiodarone.  He developed hypotension after intubation which improved with fluid bolus, and phenylephrine IV.  Heart rate also improved with amiodarone and improved ventilatory status.  Pt more alert, and interactive after intubation.  Bedside bronchoscopy then performed with aspiration of thick clear to yellow secretions from lower lobes b/l Rt > Lt.  BAL sent for culture from RLL.  Plan d/w Dr. Abbey Chatters.  CC time 50 minutes.  Coralyn Helling, MD [facility] Pulmonary/Critical Care 02/11/2013, 2:25 PM Pager:  [phone] After 3pm call: [phone]

## 2013-02-11 NOTE — Progress Notes (Signed)
79 yo 72 hours post admission with recurrent episodes of aspiration complicated by VDRF; s/p laparospcopic lysis of adhesions, redo hernia repair and Nissen fundoplication currently on TNA for nutrition; pressor dependent;   I was called by the nurse for fever, BAL performed today post intubation; results pending.   Plan: BC (including fungal culture), UA, UC; Initiate broad spectrum antibiotics for coverage of aspiration/HCAP and possible fungemia given parenteral nutrition and possible abdominal source of infection; Deescalate antibiotics according to culture and sensitivity.

## 2013-02-11 NOTE — Progress Notes (Signed)
INITIAL NUTRITION ASSESSMENT  DOCUMENTATION CODES Per approved criteria  -Severe malnutrition in the context of chronic illness  Pt meets criteria for SEVERE MALNUTRITION in the context of chronic illness as evidenced by 9% wt loss in less than 3 months and po intake <75% of estimated needs for >1 month.   INTERVENTION: TPN per PharmD  NUTRITION DIAGNOSIS: Inadequate oral intake related to inability to eat as evidenced by pt NPO s/p hernia repair with swallowing difficulty.   Goal: Pt to meet >/= 90% of their estimated nutrition needs  Monitor:  TPN initiation/rate Weight Labs SLP recommendations  Reason for Assessment: Consult for New TNA/TPN  79 y.o. male  Admitting Dx: Hiatal hernia  ASSESSMENT: 79 year old male s/p open repair of hiatal hernia 02/08/13 admitted following respiratory failure and A-fib with RVR in PACU.   Pt on BiPap mask at time of visit, RD spoke with pt's wife in room. Wife reports that she was pureeing foods for pt PTA and he was eating just a few tablespoons of food daily as well as sipping on one Ensure supplement throughout the day. Wife reports that pt was feeling very weak at home. Wife states that pt usually weighs 220 lbs but, recently weighed 180 lbs at an MD appointment PTA. Per wife's report pt lost 40 lbs in less than 2 months (19% wt loss). . Based on current weight and wt history pt has had 9% wt loss in less than 2 months.   Height: Ht Readings from Last 1 Encounters:  02/08/13 5\' 10"  (1.778 m)    Weight: Wt Readings from Last 1 Encounters:  02/11/13 201 lb 11.5 oz (91.5 kg)    Ideal Body Weight: 166 lbs  % Ideal Body Weight: 121%  Wt Readings from Last 10 Encounters:  02/11/13 201 lb 11.5 oz (91.5 kg)  02/11/13 201 lb 11.5 oz (91.5 kg)  01/27/13 196 lb 4.8 oz (89.041 kg)  01/11/13 204 lb (92.534 kg)  01/01/13 220 lb (99.791 kg)  10/11/12 220 lb 9.6 oz (100.064 kg)  09/02/12 213 lb (96.616 kg)  09/01/12 213 lb 12.8 oz (96.979  kg)    Usual Body Weight: 220 lbs  % Usual Body Weight: 91%  BMI:  Body mass index is 28.94 kg/(m^2).  Estimated Nutritional Needs: Kcal: 1980-2310 Protein: 110-128 grams Fluid: 2.5 L  Skin: +1 RLE edema, +1 LLE edema; abdominal incision  Nutrition Focused Physical Exam:  Subcutaneous Fat:  Orbital Region: wnl Upper Arm Region: moderate wasting Thoracic and Lumbar Region: NA  Muscle:  Temple Region: mild wasting Clavicle Bone Region: mild wasting Clavicle and Acromion Bone Region: mild wasting Scapular Bone Region: NA Dorsal Hand: mild wasting Patellar Region: wnl Anterior Thigh Region: wnl Posterior Calf Region: NA  Edema: +1 RLE edema, +1 LLE edema  Diet Order: NPO  EDUCATION NEEDS: -No education needs identified at this time   Intake/Output Summary (Last 24 hours) at 02/11/13 1123 Last data filed at 02/11/13 0900  Gross per 24 hour  Intake 2006.8 ml  Output   1800 ml  Net  206.8 ml    Last BM: PTA  Labs:   Recent Labs Lab 02/08/13 1641  02/08/13 1643 02/09/13 0330 02/10/13 0340 02/11/13 0333  NA  --   < > 142 137 139 138  K  --   < > 3.3* 3.8 3.5 3.2*  CL  --   < > 101 101 103 100  CO2  --   < > 22 27 30  32  BUN  --   < > 13 12 11 7   CREATININE  --   < > 0.67 0.71 0.79 0.59  CALCIUM  --   < > 9.1 9.0 8.8 8.4  MG  --   --  1.8 1.8 1.8  --   PHOS 3.4  --   --   --  2.8  --   GLUCOSE  --   < > 204* 185* 103* 112*  < > = values in this interval not displayed.  CBG (last 3)   Recent Labs  02/11/13 0018 02/11/13 0347 02/11/13 0831  GLUCAP 119* 110* 99    Scheduled Meds: . furosemide  40 mg Intravenous Once  . heparin subcutaneous  5,000 Units Subcutaneous Q8H  . insulin aspart  0-9 Units Subcutaneous Q4H  . metoprolol  2.5 mg Intravenous Q8H  . pantoprazole (PROTONIX) IV  40 mg Intravenous QHS    Continuous Infusions: . dextrose 5 % and 0.45% NaCl 75 mL/hr at 02/11/13 1610    Past Medical History  Diagnosis Date  . BPH  (benign prostatic hyperplasia)   . Macular degeneration     (L) wet, (R) dry  . B12 deficiency     HIstory  . 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 Leukoplakia 2001    Treated for  . Hypertension   . Iron deficiency anemia   . Cataract 2009    bilateral cataract surgery  . GERD (gastroesophageal reflux disease)   . Hyperlipidemia     not on medication  . Hiatal hernia   . Peripheral neuropathy     Mild History of  . Ulcer   . Bell's palsy     at present    Past Surgical History  Procedure Laterality Date  . Cholecystectomy    . Arthroscopic knee surgery      left  . Multiple oral surgeries  2002  . Tonsillectomy    . Cateract surgeries       bilateral  . Stomach surgery  2005    states his stomach had moved up toward his chest , some type of surgery performed  . Hernia repair      Laparoscopic ventral  . Hiatal hernia repair  2004  . Skin cancer excision  10/2012    right leg-shin area  . Hiatal hernia repair N/A 02/08/2013    Procedure:  REPAIR OFCURRENT HIATAL HERNIA, ;  Surgeon: Adolph Pollack, MD;  Location: [facility];  Service: General;  Laterality: N/A;  . Upper gi endoscopy N/A 02/08/2013    Procedure: UPPER GI ENDOSCOPY;  Surgeon: Adolph Pollack, MD;  Location: [facility];  Service: General;  Laterality: N/A;  . Laparoscopic lysis of adhesions N/A 02/08/2013    Procedure: LAPAROSCOPIC LYSIS OF ADHESIONS;  Surgeon: Adolph Pollack, MD;  Location: [facility];  Service: General;  Laterality: N/A;  . Laparoscopic nissen fundoplication  02/08/2013    Procedure: LAPAROSCOPIC NISSEN FUNDOPLICATION;  Surgeon: Adolph Pollack, MD;  Location: [facility];  Service: General;;    Ian Malkin RD, LDN Inpatient Clinical Dietitian Pager: [phone] After Hours Pager: [phone]

## 2013-02-11 NOTE — Progress Notes (Signed)
   Subjective:  Denies CP or dyspnea; on BIPAP   Objective:  Filed Vitals:   02/11/13 0400 02/11/13 0423 02/11/13 0500 02/11/13 0600  BP: 113/68 133/58 143/73 130/65  Pulse: 73 81 80 73  Temp: 98.9 F (37.2 C)     TempSrc: Axillary     Resp: 19 23 25 18   Height:      Weight:    201 lb 11.5 oz (91.5 kg)  SpO2: 96% 95% 95% 95%    Intake/Output from previous day:  Intake/Output Summary (Last 24 hours) at 02/11/13 1610 Last data filed at 02/11/13 0600  Gross per 24 hour  Intake 1930.2 ml  Output   1770 ml  Net  160.2 ml    Physical Exam: Physical exam: Well-developed well-nourished in no acute distress.  Skin is warm and dry.  HEENT is normal.  Neck is supple.  Chest with diminished BS bases Cardiovascular exam is regular rate and rhythm.  Abdominal exam s/p abdominal surgery Extremities show no edema. neuro grossly intact    Lab Results: Basic Metabolic Panel:  Recent Labs  96/04/54 1641  02/09/13 0330 02/10/13 0340 02/11/13 0333  NA  --   < > 137 139 138  K  --   < > 3.8 3.5 3.2*  CL  --   < > 101 103 100  CO2  --   < > 27 30 32  GLUCOSE  --   < > 185* 103* 112*  BUN  --   < > 12 11 7   CREATININE  --   < > 0.71 0.79 0.59  CALCIUM  --   < > 9.0 8.8 8.4  MG  --   < > 1.8 1.8  --   PHOS 3.4  --   --  2.8  --   < > = values in this interval not displayed. CBC:  Recent Labs  02/09/13 0330 02/11/13 0333  WBC 11.4* 11.5*  HGB 13.1 12.1*  HCT 39.9 38.4*  MCV 84.9 87.5  PLT 269 240   Cardiac Enzymes:  Recent Labs  02/08/13 1642 02/08/13 2101 02/09/13 0330  TROPONINI <0.30 <0.30 <0.30     Assessment/Plan:  1 postoperative atrial flutter-the patient remains in sinus rhythm. Amiodarone DCed yesterday by CCM (patient aspirated gastrografin and concern for chemical pneumonitis which could possibly be exacerbated by amiodarone). Atrial arrhythmias most likely related to hyperadrenergic state associated with surgery. Add low dose IV lopressor and  change to po when toleratin. No anticoagulation given recent surgery. Note LV function normal. 2 Status post abdominal surgery-management per general surgery. 3 Respiratory failure - patient on BIPAP following aspiration of gastrografin; management per CCM.  Olga Millers 02/11/2013, 7:21 AM

## 2013-02-11 NOTE — Progress Notes (Signed)
Came to provide support for family after they received difficult news that the patient is declining. They requested prayer. Prayed. Offered ongoing support and informed them about 24 hour chaplain support. Will continue provide support and presence as needed.

## 2013-02-11 NOTE — Progress Notes (Signed)
Pt was restless upon arrival to insert PICC.   While assessing for placement he kept pulling at things and moving legs around restlessly.   Primary RN came in to assist however pt was becoming increasingly restless.  HR went from 125 ST to 160's.  Primary RN placed a non rebreather mask on him.   Due to pt's HR increasing, work of breathing becoming labored, and increased restlessless decided inserting the PICC at this time was not feasible.  CCM came in to assess pt.  CCM decided to insert a central line.

## 2013-02-11 NOTE — Progress Notes (Signed)
[facility] ADULT ICU REPLACEMENT PROTOCOL FOR AM LAB REPLACEMENT ONLY  The patient does apply for the [facility] Adult ICU Electrolyte Replacment Protocol based on the criteria listed below:   1. Is GFR >/= 40 ml/min? yes  Patient's GFR today is >90 2. Is urine output >/= 0.5 ml/kg/hr for the last 6 hours? yes Patient's UOP is 1.8 ml/kg/hr 3. Is BUN < 60 mg/dL? yes  Patient's BUN today is 7 4. Abnormal electrolyte(s): K 3.2 5. Ordered repletion with: per protocol, ordered IV d/t Pt aspirated and now on BiPap 6. If a panic level lab has been reported, has the CCM MD in charge been notified? yes.   Physician:  Dr Salvadore Farber, Sharika Mosquera A 02/11/2013 4:39 AM

## 2013-02-11 NOTE — Progress Notes (Signed)
PARENTERAL NUTRITION CONSULT NOTE - FOLLOW UP  Pharmacy Consult for TPN Indication: Intolerance to enteral feeding  No Known Allergies  Patient Measurements: Height: 5\' 10"  (177.8 cm) Weight: 201 lb 11.5 oz (91.5 kg) IBW/kg (Calculated) : 73  Vital Signs: Temp: 99 F (37.2 C) (05/30 0800) Temp src: Axillary (05/30 0800) BP: 156/114 mmHg (05/30 1400) Pulse Rate: 96 (05/30 1400) Intake/Output from previous day: 05/29 0701 - 05/30 0700 In: 2030.2 [I.V.:1750.2; IV Piggyback:200] Out: 1770 [Urine:1770]  Labs:  Recent Labs  02/08/13 1641 02/09/13 0330 02/11/13 0333  WBC 15.9* 11.4* 11.5*  HGB 13.7 13.1 12.1*  HCT 42.2 39.9 38.4*  PLT 282 269 240     Recent Labs  02/08/13 1641  02/08/13 1643 02/09/13 0330 02/10/13 0340 02/11/13 0333  NA  --   < > 142 137 139 138  K  --   < > 3.3* 3.8 3.5 3.2*  CL  --   < > 101 101 103 100  CO2  --   < > 22 27 30  32  GLUCOSE  --   < > 204* 185* 103* 112*  BUN  --   < > 13 12 11 7   CREATININE  --   < > 0.67 0.71 0.79 0.59  CALCIUM  --   < > 9.1 9.0 8.8 8.4  MG  --   --  1.8 1.8 1.8  --   PHOS 3.4  --   --   --  2.8  --   < > = values in this interval not displayed. Estimated Creatinine Clearance: 81 ml/min (by C-G formula based on Cr of 0.59).    Recent Labs  02/11/13 0018 02/11/13 0347 02/11/13 0831  GLUCAP 119* 110* 99    Medications:  Scheduled:  . heparin subcutaneous  5,000 Units Subcutaneous Q8H  . insulin aspart  0-9 Units Subcutaneous Q4H  . lidocaine (cardiac) 100 mg/51ml      . midazolam      . pantoprazole (PROTONIX) IV  40 mg Intravenous QHS  . rocuronium      . sodium chloride  500 mL Intravenous Once  . succinylcholine       Infusions:  . dextrose 5 % and 0.45% NaCl 75 mL/hr at 02/10/13 1436    Nutritional Goals:   RD recs (5/30): 1980-2310 Kcal, 110-128 g Protein, 2.5 L fluid  Clinimix 5/20 at 83 ml/hr + Lipids MWF to provide 100 g protein/day and an average of  1959 Kcal/day.   Current  nutrition:   Diet: NPO  IVF: D5 NS at 75 ml/hr  CBGs & Insulin requirements past 24 hours:   CBGs ordered q4h:  Range 87-119  No insulin ordered  Assessment:  79 yo M admit 5/27 s/p laparoscopic hernia repair and Nissen fundoplication on 5/27.  The pt had aspiration during UGI swallow evaluation on 5/29.  He has a history of poor PO intake prior to admission and has severe malnutrition.  IV team unable to place PICC; CCM to place central line on 5/30.  Renal/hepatic function: SCr and LFTs were WNL  Electrolytes: WNL except K (3.2) for which Elink has ordered K runs x4.  Pre-Albumin: pending  TG/Cholesterol: pending   Plan:  At 1800 tonight Clinimix E 5/20 at 40 ml/hr Fat emulsion at 10 ml/hr (MWF only due to ongoing shortage). TNA to contain standard multivitamins and trace elements (MWF only due to ongoing shortage). Reduce IVF to 35 ml/hr. Add sensitive SSI q4h (CBGs q4h) TNA lab  panels on Mondays & Thursdays. F/u daily.   Lynann Beaver PharmD, BCPS Pager [phone] 02/11/2013 2:24 PM

## 2013-02-11 NOTE — Progress Notes (Signed)
He appears to have aspirated again during attempt to put the PICC line in and went into acute respiratory distress again and rapid afib.  He was reintubated and given IV Amiodarone.  Plans are for a bronchoscopy as well.  Will need to start nutrition when he is more stable.  He could have an og tube placed but it would need to be done under flouroscopy.    I have discussed all of this and his overall situation with his wife.  She stated that apparently there was some concern about aspiration the past month due to his Bell's palsy but this had improved/resolved.  I was not made aware of this preoperatively.

## 2013-02-11 NOTE — Progress Notes (Signed)
3 Days Post-Op  Subjective:   Mild dyspnea on BIPAP.  No chest pain.  No abdominal pain.  Belching some.  No flatus.  Slept better.  Objective: Vital signs in last 24 hours: Temp:  [98.4 F (36.9 C)-99.8 F (37.7 C)] 98.9 F (37.2 C) (05/30 0400) Pulse Rate:  [38-114] 73 (05/30 0600) Resp:  [15-38] 18 (05/30 0600) BP: (104-214)/(56-116) 130/65 mmHg (05/30 0600) SpO2:  [92 %-99 %] 95 % (05/30 0600) FiO2 (%):  [40 %-100 %] 50 % (05/30 0423) Weight:  [201 lb 11.5 oz (91.5 kg)] 201 lb 11.5 oz (91.5 kg) (05/30 0600)    Intake/Output from previous day: 05/29 0701 - 05/30 0700 In: 1930.2 [I.V.:1750.2; IV Piggyback:100] Out: 1770 [Urine:1770] Intake/Output this shift: Total I/O In: 925 [I.V.:825; IV Piggyback:100] Out: 1100 [Urine:1100]  PE: General- In NAD, awake and alert. Abdomen-soft, nontender, incisions are clean and intact  Lab Results:   Recent Labs  02/09/13 0330 02/11/13 0333  WBC 11.4* 11.5*  HGB 13.1 12.1*  HCT 39.9 38.4*  PLT 269 240   BMET  Recent Labs  02/10/13 0340 02/11/13 0333  NA 139 138  K 3.5 3.2*  CL 103 100  CO2 30 32  GLUCOSE 103* 112*  BUN 11 7  CREATININE 0.79 0.59  CALCIUM 8.8 8.4   PT/INR No results found for this basename: LABPROT, INR,  in the last 72 hours Comprehensive Metabolic Panel:    Component Value Date/Time   NA 138 02/11/2013 0333   K 3.2* 02/11/2013 0333   CL 100 02/11/2013 0333   CO2 32 02/11/2013 0333   BUN 7 02/11/2013 0333   CREATININE 0.59 02/11/2013 0333   GLUCOSE 112* 02/11/2013 0333   CALCIUM 8.4 02/11/2013 0333   AST 24 01/27/2013 1045   ALT 14 01/27/2013 1045   ALKPHOS 50 01/27/2013 1045   BILITOT 0.9 01/27/2013 1045   PROT 7.3 01/27/2013 1045   ALBUMIN 3.3* 01/27/2013 1045     Studies/Results: Dg Chest Port 1 View  02/10/2013   *RADIOLOGY REPORT*  Clinical Data: Contrast aspiration  PORTABLE CHEST - 1 VIEW  Comparison: 02/10/2013 at 1051 hours  Findings: Low lung volumes with bibasilar atelectasis. Small  bilateral pleural effusions are suspected.  Aspirated contrast within the bilateral lower lobe pulmonary bronchi.  No pneumothorax.  Cardiomegaly.  Cholecystectomy clips.  IMPRESSION: Low lung volumes with bibasilar atelectasis and probable small bilateral pleural effusions.  Aspirated contrast within the bilateral lower lobe pulmonary bronchi.   Original Report Authenticated By: Charline Bills, M.D.   Dg Chest Port 1 View  02/10/2013   *RADIOLOGY REPORT*  Clinical Data: Post aspiration of gastrographin, recent upper GI  PORTABLE CHEST - 1 VIEW  Comparison: 02/09/2013; Upper GI - earlier same day;  Findings:  Grossly unchanged enlarged cardiac silhouette and mediastinal contours.  Interval extubation.  No pneumothorax.  Moderate to large amount of enteric contrast remains within the mid and distal esophagus.  Contrast has been aspirated and is seen within the bilateral lower lobe bronchi.  Lung volumes remain reduced with grossly unchanged small bilateral effusions and associated bibasilar opacities, right greater than left.  Mild pulmonary venous congestion without frank evidence of edema.  Unchanged bones.  Post cholecystectomy.  IMPRESSION: 1.  Interval extubation.  No pneumothorax. 2.  Moderate to large amount residual contrast within the mid and distal esophagus.  Aspirated contrast is seen within bilateral lower lobe segmental and subsegmental bronchi. 3.  Persistently reduced lung volumes with unchanged small bilateral effusions and  associated bibasilar opacities, right greater than left, atelectasis versus infiltrate.   Original Report Authenticated By: Tacey Ruiz, MD   Dg Kayleen Memos W/water Sol Cm  02/10/2013   *RADIOLOGY REPORT*  Clinical Data:Status post hiatal hernia repair  ESOPHAGUS/BARIUM SWALLOW/TABLET STUDY  Fluoroscopy Time: 2 minutes 30 seconds.  Comparison: Chest radiograph from 02/09/2013  Findings: Multiple attempts were made to have the patient ingested the contrast material.  The patient  was not able to use a straw and requested ingestion of the contrast without the use of straw.  Only a small amount of contrast was swallowed when aspiration into the lower airway and bronchi was identified.  Examination was promptly terminated.  There was insufficient contrast material ingested to adequately assess patency of the fundoplication wrap.  No extravasation of contrast material noted.  IMPRESSION:  1.  Aspiration of contrast. 2.  Insufficient ingestion of contrast material to assess patency of the fundoplication wrap.   Original Report Authenticated By: Signa Kell, M.D.    Anti-infectives: Anti-infectives   Start     Dose/Rate Route Frequency Ordered Stop   02/08/13 1700  ceFAZolin (ANCEF) IVPB 1 g/50 mL premix     1 g 100 mL/hr over 30 Minutes Intravenous Every 6 hours 02/08/13 1639 02/09/13 0539   02/08/13 0650  ceFAZolin (ANCEF) IVPB 2 g/50 mL premix     2 g 100 mL/hr over 30 Minutes Intravenous On call to O.R. 02/08/13 0650 02/08/13 0957      Assessment Principal Problem:   Hiatal hernia-recurrent s/p laparoscopic repair and Nissen fundoplication on 02/08/13 Active Problems:   Bell's palsy   Atrial fibrillation-in NSR    Acute aspiration-looks better on BIPAP  Hypokalemia-being replaced.  Malnutrition  ? Pulm edema with effusions on CXR      LOS: 3 days   Plan: MBS when more stable from respiratory standpoint.  Keep NPO. PICC and TPN.  Made need diuresis-will leave this up to Pulm/CCM.  Leave foley in for strict I & O.  OOB to chair if okay with Pulm/CCM.   Victor Sutton 02/11/2013

## 2013-02-11 NOTE — Progress Notes (Signed)
Peripherally Inserted Central Catheter/Midline Placement  The IV Nurse has discussed with the patient and/or persons authorized to consent for the patient, the purpose of this procedure and the potential benefits and risks involved with this procedure.  The benefits include less needle sticks, lab draws from the catheter and patient may be discharged home with the catheter.  Risks include, but not limited to, infection, bleeding, blood clot (thrombus formation), and puncture of an artery; nerve damage and irregular heat beat.  Alternatives to this procedure were also discussed.  PICC/Midline Placement Documentation        Lisabeth Devoid 02/11/2013, 12:34 PM Obtained consent at bedside from wife.

## 2013-02-11 NOTE — Progress Notes (Signed)
PULMONARY  / CRITICAL CARE MEDICINE  Name: Victor Sutton MRN: [id number] DOB: Jun 10, 1928    ADMISSION DATE:  02/08/2013 CONSULTATION DATE:  02/08/13  REFERRING MD :  Okey Dupre PRIMARY SERVICE: Surgery  CHIEF COMPLAINT:  S/P hiatal hernia repair  BRIEF PATIENT DESCRIPTION: 79 year old male s/p open repair of hiatal hernia 02/08/13 admitted following respiratory failure and A-fib with RVR in PACU.  LINES / TUBES: 5/27 ETT>>> 5/28  CULTURES: None pending  ANTIBIOTICS: None  HISTORY OF PRESENT ILLNESS:  Victor Sutton is a 79 year old male with a history of a recurrent hiatal hernia and reflux. He was taken to the OR 02/08/13 for an open repair of his hernia with a Nissen fundoplication. He tolerated the operation well and was take to PACU.  While in PACU he developed respiratory distress following extubation with A-fib and RVR. He received multiple agents to control his heart rhythm without success and was re-intubated.  He is admitted to the ICU for further management of his A-fib and the ventilator.   SIGNIFICANT EVENTS / STUDIES:  5/27 S/P open repair of hiatal hernia   02/10/2013: He went for a GI study with iohexol [Gastrografin] which is a water-soluble substance that can be used when there is concern for aspiration with barium but can cause chemical pneumonitis. He aspirated this and went into respiratory distress. Chest x-ray shows Gastrografin filling both lower lobe airways    SUBJECTIVE/OVERNIGHT/INTERVAL HX 02/11/13: Improved with bipap. Trying to come off, /contrast cleared on cxr   VITAL SIGNS: Temp:  [98.5 F (36.9 C)-99.8 F (37.7 C)] 99 F (37.2 C) (05/30 0800) Pulse Rate:  [38-114] 86 (05/30 0900) Resp:  [16-38] 23 (05/30 0900) BP: (104-214)/(56-116) 166/85 mmHg (05/30 0900) SpO2:  [92 %-99 %] 96 % (05/30 0900) FiO2 (%):  [40 %-100 %] 40 % (05/30 0800) Weight:  [91.5 kg (201 lb 11.5 oz)] 91.5 kg (201 lb 11.5 oz) (05/30 0600)  INTAKE / OUTPUT: Intake/Output      05/29 0701 - 05/30 0700 05/30 0701 - 05/31 0700   I.V. (mL/kg) 1750.2 (19.1) 150 (1.6)   Other 80    IV Piggyback 200 200   Total Intake(mL/kg) 2030.2 (22.2) 350 (3.8)   Urine (mL/kg/hr) 1770 (0.8) 100 (0.4)   Total Output 1770 100   Net +260.2 +250          PHYSICAL EXAMINATION: General:  Elderly male,  Neuro:  Awake and able to follow commands HEENT:  PERRL, EOM intact, cough and gag present Cardiovascular:  RRR, S1 and S2, no JVD, +2 radial and DP pulses. No carotid bruits. Lungs:  CTA bilateral lung fields anterior Abdomen:  Round, soft, non-tender. Bowel sounds hypoactive Musculoskeletal:  MAE well and equal. Muscle strength is 5/5 bilateral upper and lower extremities Skin:  Skin is intact aside from surgical incisions   Variable  0 Points 1 Point 2 Points Total  Heart rate per minute  <90 beats 90-109 beats >110 beats 0  Respiratory  Rate per minute < 18 breaths 19-30 breaths  >30 breaths 1  Restlessness; nonpurposeful movements None  occas slight movement Frequent movement 0  Paradoxical breathing pattern: None  Present 0  Accessory muscle use: rise in clavicle during inspiration None Slight rise Pronounced rise 1  Grunting at end-expiration: guttural sound None  Present 0  Nasal flaring: involuntary movement of nares None  Present 0  Look of fear None  Eyes wide 0  Overall total out of 16  2    Respiratory Distress Observation Scale Journal of Palliative Medicine Vol. 13, Number 3, 2010 Victor Sutton et al.    LABS: - see individual A&P   Dg Chest Port 1 View  02/11/2013   *RADIOLOGY REPORT*  Clinical Data: Follow up aspiration; shortness of breath.  PORTABLE CHEST - 1 VIEW  Comparison: Chest radiograph performed 02/10/2013  Findings: No definite residual contrast is characterized at the lung bases.  Small bilateral pleural effusions are seen, with bibasilar airspace opacities, possibly reflecting mild pulmonary edema.  Underlying vascular congestion is seen.   Pneumonia might have a similar appearance.  The cardiomediastinal silhouette is mildly enlarged.  No acute osseous abnormalities are seen.  IMPRESSION:  1.  No definite residual contrast seen at the lung bases.  Small bilateral pleural effusions, with bibasilar airspace opacities, possibly reflecting mild pulmonary edema.  Pneumonia might have a similar appearance. 2.  Underlying vascular congestion and mild cardiomegaly.   Original Report Authenticated By: Victor Sutton, M.D.   Dg Chest Port 1 View  02/10/2013   *RADIOLOGY REPORT*  Clinical Data: Contrast aspiration  PORTABLE CHEST - 1 VIEW  Comparison: 02/10/2013 at 1051 hours  Findings: Low lung volumes with bibasilar atelectasis. Small bilateral pleural effusions are suspected.  Aspirated contrast within the bilateral lower lobe pulmonary bronchi.  No pneumothorax.  Cardiomegaly.  Cholecystectomy clips.  IMPRESSION: Low lung volumes with bibasilar atelectasis and probable small bilateral pleural effusions.  Aspirated contrast within the bilateral lower lobe pulmonary bronchi.   Original Report Authenticated By: Victor Sutton, M.D.   Dg Chest Port 1 View  02/10/2013   *RADIOLOGY REPORT*  Clinical Data: Post aspiration of gastrographin, recent upper GI  PORTABLE CHEST - 1 VIEW  Comparison: 02/09/2013; Upper GI - earlier same day;  Findings:  Grossly unchanged enlarged cardiac silhouette and mediastinal contours.  Interval extubation.  No pneumothorax.  Moderate to large amount of enteric contrast remains within the mid and distal esophagus.  Contrast has been aspirated and is seen within the bilateral lower lobe bronchi.  Lung volumes remain reduced with grossly unchanged small bilateral effusions and associated bibasilar opacities, right greater than left.  Mild pulmonary venous congestion without frank evidence of edema.  Unchanged bones.  Post cholecystectomy.  IMPRESSION: 1.  Interval extubation.  No pneumothorax. 2.  Moderate to large amount residual  contrast within the mid and distal esophagus.  Aspirated contrast is seen within bilateral lower lobe segmental and subsegmental bronchi. 3.  Persistently reduced lung volumes with unchanged small bilateral effusions and associated bibasilar opacities, right greater than left, atelectasis versus infiltrate.   Original Report Authenticated By: Tacey Ruiz, MD   Dg Kayleen Memos W/water Sol Cm  02/10/2013   *RADIOLOGY REPORT*  Clinical Data:Status post hiatal hernia repair  ESOPHAGUS/BARIUM SWALLOW/TABLET STUDY  Fluoroscopy Time: 2 minutes 30 seconds.  Comparison: Chest radiograph from 02/09/2013  Findings: Multiple attempts were made to have the patient ingested the contrast material.  The patient was not able to use a straw and requested ingestion of the contrast without the use of straw.  Only a small amount of contrast was swallowed when aspiration into the lower airway and bronchi was identified.  Examination was promptly terminated.  There was insufficient contrast material ingested to adequately assess patency of the fundoplication wrap.  No extravasation of contrast material noted.  IMPRESSION:  1.  Aspiration of contrast. 2.  Insufficient ingestion of contrast material to assess patency of the fundoplication wrap.   Original Report Authenticated By: Ladona Ridgel  Bradly Chris, M.D.    ASSESSMENT / PLAN:  PULMONARY  Recent Labs Lab 02/08/13 1714 02/09/13 0418  PHART 7.412 7.584*  PCO2ART 36.0 23.1*  PO2ART 77.5* 77.7*  HCO3 22.5 22.3  TCO2 19.8 19.1  O2SAT 95.7 97.6    A: Acute respiratory failure s/p surgical repair of hiatal hernia.  Noted to have Rt hemidiaphragm elevation on AP film after intubation ?significance. Contrast Aspiration (during swallow evaluation)  02/11/13: Improved clinically. CR shows contrast cleard but CXR shows lower lobe atlectasis +/- effusion.   P:   -Continue IS and pulmonary toilet  -Bipap for respiratory distress -serial chest x-ray -ATt his stage will  Hold off on  bronch   CARDIOVASCULAR  Recent Labs Lab 02/10/13 0340  PROBNP 212.3     Recent Labs Lab 02/08/13 1642 02/08/13 2101 02/09/13 0330  TROPONINI <0.30 <0.30 <0.30    A: A-flutter with RVR-->now in Sinus  Need to r/o demand ischemia  02/11/13:  Still in sinus. We stopped amio yesterday due to theoretical concern for ALI after iohexol aspriation  P:  - beta blocker per cards - if he improves resp wise but A Fib recurs ok to challenge with amio - Try lasix x 1 02/11/13   RENAL  Recent Labs Lab 02/08/13 1641  02/08/13 1643 02/09/13 0330 02/10/13 0340 02/11/13 0333  NA  --   --  142 137 139 138  K  --   < > 3.3* 3.8 3.5 3.2*  CL  --   --  101 101 103 100  CO2  --   --  22 27 30  32  GLUCOSE  --   --  204* 185* 103* 112*  BUN  --   --  13 12 11 7   CREATININE  --   --  0.67 0.71 0.79 0.59  CALCIUM  --   --  9.1 9.0 8.8 8.4  MG  --   --  1.8 1.8 1.8  --   PHOS 3.4  --   --   --  2.8  --   < > = values in this interval not displayed.  A:  No active issues, nml renal fxn at BL: cr 0.73 P:   -f/u bmet and replace electrolytes as needed -follow I&O trend  GASTROINTESTINAL No results found for this basename: AST, ALT, ALKPHOS, BILITOT, PROT, ALBUMIN, INR,  in the last 168 hours  A:  S/P repair of hiatal hernia and Nissen Fundoplication 02/10/13: Aspirated iohexol during GI followthrough study. Has baseline Bell's and wife reporting weeks of dysphagia  P:   -Maintain NPO -Protonix for SUP -Follow surgery recs - Might need TPN  HEMATOLOGIC  Recent Labs Lab 02/08/13 1641 02/09/13 0330 02/11/13 0333  HGB 13.7 13.1 12.1*  HCT 42.2 39.9 38.4*  WBC 15.9* 11.4* 11.5*  PLT 282 269 240    A:  No active issues, bl hgb 14.6 P:  -- PRBC for hgb </= 6.9gm%    - exceptions are   -  if ACS susepcted/confirmed then transfuse for hgb </= 8.0gm%,  or    -  If septic shock first 24h and scvo2 < 70% then transfuse for hgb </= 9.0gm%   - active bleeding with hemodynamic  instability, then transfuse regardless of hemoglobin value   At at all times try to transfuse 1 unit prbc as possible with exception of active hemorrhage    INFECTIOUS No results found for this basename: LATICACIDVEN, PROCALCITON,  in the last 168 hours  A:  No active issues P:   -Follow WBC -No current indication for antibiotic therapy  ENDOCRINE CBG (last 3)   Recent Labs  02/11/13 0018 02/11/13 0347 02/11/13 0831  GLUCAP 119* 110* 99     A:  No active issues   P:   -Follow CBG  NEUROLOGIC A:  Awake and following commands P:   -Morphine for pain control    GLOBAL 02/11/13: Wife updated in detail  The patient is critically ill with multiple organ systems failure and requires high complexity decision making for assessment and support, frequent evaluation and titration of therapies, application of advanced monitoring technologies and extensive interpretation of multiple databases.   Critical Care Time devoted to patient care services described in this note is  35  Minutes.  Dr. Kalman Shan, M.D., [facility].C.P Pulmonary and Critical Care Medicine Staff Physician [facility] Pager: [phone], If no answer or between  15:00h - 7:00h: call [phone]  02/11/2013 9:54 AM

## 2013-02-11 NOTE — Progress Notes (Signed)
E-Link , MD called d/t pt has a temperature Roanna Epley, Lind Guest, RN

## 2013-02-12 DIAGNOSIS — Z9889 Other specified postprocedural states: Secondary | ICD-10-CM

## 2013-02-12 DIAGNOSIS — T17908A Unspecified foreign body in respiratory tract, part unspecified causing other injury, initial encounter: Secondary | ICD-10-CM | POA: Diagnosis present

## 2013-02-12 LAB — CBC
HCT: 38.2 % — ABNORMAL LOW (ref 39.0–52.0)
Hemoglobin: 12.2 g/dL — ABNORMAL LOW (ref 13.0–17.0)
MCH: 27.4 pg (ref 26.0–34.0)
MCHC: 31.9 g/dL (ref 30.0–36.0)
MCV: 85.8 fL (ref 78.0–100.0)
Platelets: 232 10*3/uL (ref 150–400)
RBC: 4.45 MIL/uL (ref 4.22–5.81)
RDW: 16.6 % — ABNORMAL HIGH (ref 11.5–15.5)
WBC: 10.6 10*3/uL — ABNORMAL HIGH (ref 4.0–10.5)

## 2013-02-12 LAB — MAGNESIUM
Magnesium: 2 mg/dL (ref 1.5–2.5)
Magnesium: 1.7 mg/dL (ref 1.5–2.5)

## 2013-02-12 LAB — COMPREHENSIVE METABOLIC PANEL
ALT: 19 U/L (ref 0–53)
AST: 25 U/L (ref 0–37)
Albumin: 2.1 g/dL — ABNORMAL LOW (ref 3.5–5.2)
Alkaline Phosphatase: 47 U/L (ref 39–117)
BUN: 13 mg/dL (ref 6–23)
CO2: 32 mEq/L (ref 19–32)
Calcium: 8.5 mg/dL (ref 8.4–10.5)
Chloride: 101 mEq/L (ref 96–112)
Creatinine, Ser: 0.77 mg/dL (ref 0.50–1.35)
GFR calc Af Amer: >90 mL/min (ref >90)
GFR calc non Af Amer: 82 mL/min — ABNORMAL LOW (ref >90)
Glucose, Bld: 131 mg/dL — ABNORMAL HIGH (ref 70–99)
Potassium: 2.4 mEq/L — CL (ref 3.5–5.1)
Sodium: 140 mEq/L (ref 135–145)
Total Bilirubin: 1.9 mg/dL — ABNORMAL HIGH (ref 0.3–1.2)
Total Protein: 5.9 g/dL — ABNORMAL LOW (ref 6.0–8.3)

## 2013-02-12 LAB — BASIC METABOLIC PANEL
BUN: 11 mg/dL (ref 6–23)
CO2: 29 mEq/L (ref 19–32)
Calcium: 8.1 mg/dL — ABNORMAL LOW (ref 8.4–10.5)
Chloride: 103 mEq/L (ref 96–112)
Creatinine, Ser: 0.69 mg/dL (ref 0.50–1.35)
GFR calc Af Amer: >90 mL/min (ref >90)
GFR calc non Af Amer: 86 mL/min — ABNORMAL LOW (ref >90)
Glucose, Bld: 120 mg/dL — ABNORMAL HIGH (ref 70–99)
Potassium: 2.8 mEq/L — ABNORMAL LOW (ref 3.5–5.1)
Sodium: 139 mEq/L (ref 135–145)

## 2013-02-12 LAB — GLUCOSE, CAPILLARY
Glucose-Capillary: 150 mg/dL — ABNORMAL HIGH (ref 70–99)
Glucose-Capillary: 133 mg/dL — ABNORMAL HIGH (ref 70–99)
Glucose-Capillary: 139 mg/dL — ABNORMAL HIGH (ref 70–99)
Glucose-Capillary: 163 mg/dL — ABNORMAL HIGH (ref 70–99)
Glucose-Capillary: 128 mg/dL — ABNORMAL HIGH (ref 70–99)
Glucose-Capillary: 128 mg/dL — ABNORMAL HIGH (ref 70–99)

## 2013-02-12 LAB — CHOLESTEROL, TOTAL: Cholesterol: 101 mg/dL (ref 0–200)

## 2013-02-12 LAB — DIFFERENTIAL
Basophils Absolute: 0 10*3/uL (ref 0.0–0.1)
Basophils Relative: 0 % (ref 0–1)
Eosinophils Absolute: 0 10*3/uL (ref 0.0–0.7)
Eosinophils Relative: 0 % (ref 0–5)
Lymphocytes Relative: 12 % (ref 12–46)
Lymphs Abs: 1.3 10*3/uL (ref 0.7–4.0)
Monocytes Absolute: 1.1 10*3/uL — ABNORMAL HIGH (ref 0.1–1.0)
Monocytes Relative: 11 % (ref 3–12)
Neutro Abs: 8.1 10*3/uL — ABNORMAL HIGH (ref 1.7–7.7)
Neutrophils Relative %: 77 % (ref 43–77)

## 2013-02-12 LAB — PHOSPHORUS
Phosphorus: 1.1 mg/dL — ABNORMAL LOW (ref 2.3–4.6)
Phosphorus: 2.3 mg/dL (ref 2.3–4.6)

## 2013-02-12 LAB — PREALBUMIN: Prealbumin: 8.9 mg/dL — ABNORMAL LOW (ref 17.0–34.0)

## 2013-02-12 LAB — PRO B NATRIURETIC PEPTIDE: Pro B Natriuretic peptide (BNP): 843.9 pg/mL — ABNORMAL HIGH (ref 0–450)

## 2013-02-12 LAB — TRIGLYCERIDES: Triglycerides: 88 mg/dL (ref <150)

## 2013-02-12 MED ORDER — POTASSIUM CHLORIDE 10 MEQ/50ML IV SOLN
10.0000 meq | INTRAVENOUS | Status: AC
  Filled 2013-02-12: qty 100

## 2013-02-12 MED ORDER — VANCOMYCIN HCL IN DEXTROSE 1-5 GM/200ML-% IV SOLN
1000.0000 mg | Freq: Two times a day (BID) | INTRAVENOUS | Status: DC
  Administered 2013-02-12 – 2013-02-14 (×5): 1000 mg via INTRAVENOUS
  Filled 2013-02-12 (×5): qty 200

## 2013-02-12 MED ORDER — POTASSIUM PHOSPHATE DIBASIC 3 MMOLE/ML IV SOLN
40.0000 meq | Freq: Once | INTRAVENOUS | Status: AC
  Administered 2013-02-12: 40 meq via INTRAVENOUS
  Filled 2013-02-12: qty 9.09

## 2013-02-12 MED ORDER — POTASSIUM CHLORIDE 10 MEQ/50ML IV SOLN
10.0000 meq | INTRAVENOUS | Status: AC
  Administered 2013-02-12 (×4): 10 meq via INTRAVENOUS
  Filled 2013-02-12: qty 200

## 2013-02-12 MED ORDER — CLINIMIX E/DEXTROSE (5/20) 5 % IV SOLN
INTRAVENOUS | Status: AC
  Administered 2013-02-12: 17:00:00 via INTRAVENOUS
  Filled 2013-02-12: qty 1000

## 2013-02-12 MED ORDER — POTASSIUM CHLORIDE 10 MEQ/50ML IV SOLN: 10.0000 meq | INTRAVENOUS | Status: DC

## 2013-02-12 MED ORDER — POTASSIUM CHLORIDE 10 MEQ/50ML IV SOLN
10.0000 meq | INTRAVENOUS | Status: AC
  Administered 2013-02-12 (×2): 10 meq via INTRAVENOUS

## 2013-02-12 NOTE — Progress Notes (Signed)
PULMONARY  / CRITICAL CARE MEDICINE  Name: Victor Sutton MRN: [id number] DOB: July 06, 1928    ADMISSION DATE:  02/08/2013 CONSULTATION DATE:  02/08/13  REFERRING MD :  Okey Dupre PRIMARY SERVICE: Surgery  CHIEF COMPLAINT:  S/P hiatal hernia repair  BRIEF PATIENT DESCRIPTION: 79 year old male s/p open repair of hiatal hernia 02/08/13 admitted following respiratory failure and A-fib with RVR in PACU.  LINES / TUBES: 5/27 ETT>>> 5/28 5/30 ETT >>  R IJ CVL 5/30 >>   CULTURES: BAL 5/30 >> 30 k GNRs  >> Blood 5/30 >>  ANTIBIOTICS: Micafungin 5/30 >>  Vanc 5/30 >>  Zosyn 5/30 >>     SIGNIFICANT EVENTS / STUDIES:  5/27 S/P open repair of hiatal hernia 02/10/2013: Gastrograffin swallow study > aspirated 5/30 Intubated for respiratory distress    SUBJECTIVE/OVERNIGHT/INTERVAL HX Events noted. Tolerated SBT for about an hour but then became tachypneic. RASS 0. + F/C   VITAL SIGNS: Temp:  [99.5 F (37.5 C)-102.2 F (39 C)] 99.5 F (37.5 C) (05/31 1200) Pulse Rate:  [57-101] 72 (05/31 1500) Resp:  [14-22] 22 (05/31 1500) BP: (76-145)/(46-84) 130/73 mmHg (05/31 1500) SpO2:  [89 %-98 %] 95 % (05/31 1500) FiO2 (%):  [40 %-50 %] 40 % (05/31 1306) Weight:  [87.9 kg (193 lb 12.6 oz)] 87.9 kg (193 lb 12.6 oz) (05/31 0500)  INTAKE / OUTPUT: Intake/Output     05/30 0701 - 05/31 0700 05/31 0701 - 06/01 0700   I.V. (mL/kg) 1118.4 (12.7) 23.1 (0.3)   Other     IV Piggyback 1629.1 450   TPN 640 450   Total Intake(mL/kg) 3387.5 (38.5) 923.1 (10.5)   Urine (mL/kg/hr) 3085 (1.5) 365 (0.5)   Total Output 3085 365   Net +302.5 +558.1          PHYSICAL EXAMINATION: General: intubated, NAD Neuro: no focal deficits HEENT: WNL Cardiovascular:  RRR s M Lungs:  CTA anteriorly Abdomen:  Soft, NT, NABS Musculoskeletal:  MAE well and equal. No edema   LABS:  BMET    Component Value Date/Time   NA 140 02/12/2013 0430   K 2.4* 02/12/2013 0430   CL 101 02/12/2013 0430   CO2 32 02/12/2013  0430   GLUCOSE 131* 02/12/2013 0430   BUN 13 02/12/2013 0430   CREATININE 0.77 02/12/2013 0430   CALCIUM 8.5 02/12/2013 0430   GFRNONAA 82* 02/12/2013 0430   GFRAA >90 02/12/2013 0430    CBC    Component Value Date/Time   WBC 10.6* 02/12/2013 0430   RBC 4.45 02/12/2013 0430   HGB 12.2* 02/12/2013 0430   HCT 38.2* 02/12/2013 0430   PLT 232 02/12/2013 0430   MCV 85.8 02/12/2013 0430   MCH 27.4 02/12/2013 0430   MCHC 31.9 02/12/2013 0430   RDW 16.6* 02/12/2013 0430   LYMPHSABS 1.3 02/12/2013 0430   MONOABS 1.1* 02/12/2013 0430   EOSABS 0.0 02/12/2013 0430   BASOSABS 0.0 02/12/2013 0430    CXR: no new film  ASSESSMENT / PLAN:  PULMONARY A: Acute post op respiratory failure s/p repair of hiatal hernia. Contrast Aspiration 5/30   P:   -Continue vent support -Daily WUA/SBT   CARDIOVASCULAR A: A-flutter with RVR > NSR  P:  - Cont current Rx   RENAL A:  No active issues P:   -f/u bmet and replace electrolytes as needed -follow I&O trend  GASTROINTESTINAL A:  S/P repair of hiatal hernia and Nissen Fundoplication 02/10/13 Dysphagia  P:   -Cont TPN per [facility] -  Will need to address dysphagia after intubation   HEMATOLOGIC A:  No active issues P:  -Monitor   INFECTIOUS A:  Aspiration PNA/pneumonitis P:   -Micro and abx as above   ENDOCRINE A: Hyperglycemia on TPN  P:   -Cont SSI   NEUROLOGIC A:  No issues P:   -Cont fentanyl infusion while intubated    GLOBAL 02/12/13: Wife updated in detail  35 mins CCM  Billy Fischer, MD ; [facility] [phone].  After 5:30 PM or weekends, call [phone]

## 2013-02-12 NOTE — Progress Notes (Signed)
Pharmacy - [facility]NA Follow-Up  Repeat K+ is 2.8 after 3 runs KCl , 4th run given after lab checked. Repeat Phos is 2.3 after ~35mmol phosphorus  Plan:  Repeat KCl 10 meq IV x 4 more runs (8 runs total today)  No additional phos tonight - f/u in am  Loralee Pacas, PharmD, BCPS 02/12/2013 7:27 PM

## 2013-02-12 NOTE — Progress Notes (Signed)
Patient ID: Victor Sutton, male   DOB: [birth year], 79 y.o.   MRN: [id number] [facility] Surgery Progress Note:   4 Days Post-Op  Subjective: Mental status is clear enough to communicate by writing his questions on a tablet.   Objective: Vital signs in last 24 hours: Temp:  [99.8 F (37.7 C)-102.2 F (39 C)] 99.8 F (37.7 C) (05/31 0400) Pulse Rate:  [45-165] 62 (05/31 0630) Resp:  [14-27] 15 (05/31 0630) BP: (40-172)/(24-114) 110/62 mmHg (05/31 0600) SpO2:  [79 %-100 %] 94 % (05/31 0630) FiO2 (%):  [40 %-100 %] 40 % (05/31 0358) Weight:  [193 lb 12.6 oz (87.9 kg)] 193 lb 12.6 oz (87.9 kg) (05/31 0500)  Intake/Output from previous day: 05/30 0701 - 05/31 0700 In: 3387.5 [I.V.:1118.4; IV Piggyback:1629.1; TPN:640] Out: 3085 [Urine:3085] Intake/Output this shift:    Physical Exam: Work of breathing is ventilator dependent.  Breath sound are more coarse on the right.  HR down and controlled.    Lab Results:  Results for orders placed during the hospital encounter of 02/08/13 (from the past 48 hour(s))  GLUCOSE, CAPILLARY     Status: None   Collection Time    02/10/13 12:07 PM      Result Value Range   Glucose-Capillary 87  70 - 99 mg/dL  GLUCOSE, CAPILLARY     Status: None   Collection Time    02/10/13  3:26 PM      Result Value Range   Glucose-Capillary 93  70 - 99 mg/dL  GLUCOSE, CAPILLARY     Status: Abnormal   Collection Time    02/10/13  7:43 PM      Result Value Range   Glucose-Capillary 102 (*) 70 - 99 mg/dL  GLUCOSE, CAPILLARY     Status: Abnormal   Collection Time    02/11/13 12:18 AM      Result Value Range   Glucose-Capillary 119 (*) 70 - 99 mg/dL   Comment 1 Notify RN    CBC     Status: Abnormal   Collection Time    02/11/13  3:33 AM      Result Value Range   WBC 11.5 (*) 4.0 - 10.5 K/uL   RBC 4.39  4.22 - 5.81 MIL/uL   Hemoglobin 12.1 (*) 13.0 - 17.0 g/dL   HCT 40.9 (*) 81.1 - 91.4 %   MCV 87.5  78.0 - 100.0 fL   MCH 27.6  26.0 - 34.0 pg    MCHC 31.5  30.0 - 36.0 g/dL   RDW 78.2 (*) 95.6 - 21.3 %   Platelets 240  150 - 400 K/uL  BASIC METABOLIC PANEL     Status: Abnormal   Collection Time    02/11/13  3:33 AM      Result Value Range   Sodium 138  135 - 145 mEq/L   Potassium 3.2 (*) 3.5 - 5.1 mEq/L   Chloride 100  96 - 112 mEq/L   CO2 32  19 - 32 mEq/L   Glucose, Bld 112 (*) 70 - 99 mg/dL   BUN 7  6 - 23 mg/dL   Creatinine, Ser 0.86  0.50 - 1.35 mg/dL   Calcium 8.4  8.4 - 57.8 mg/dL   GFR calc non Af Amer >90  >90 mL/min   GFR calc Af Amer >90  >90 mL/min   Comment:            The eGFR has been calculated     using the  CKD EPI equation.     This calculation has not been     validated in all clinical     situations.     eGFR's persistently     <90 mL/min signify     possible Chronic Kidney Disease.  GLUCOSE, CAPILLARY     Status: Abnormal   Collection Time    02/11/13  3:47 AM      Result Value Range   Glucose-Capillary 110 (*) 70 - 99 mg/dL   Comment 1 Notify RN    GLUCOSE, CAPILLARY     Status: None   Collection Time    02/11/13  8:31 AM      Result Value Range   Glucose-Capillary 99  70 - 99 mg/dL  GLUCOSE, CAPILLARY     Status: Abnormal   Collection Time    02/11/13  2:40 PM      Result Value Range   Glucose-Capillary 118 (*) 70 - 99 mg/dL   Comment 1 Documented in Chart     Comment 2 Notify RN    BLOOD GAS, ARTERIAL     Status: Abnormal   Collection Time    02/11/13  3:02 PM      Result Value Range   FIO2 1.00     Delivery systems VENTILATOR     Mode PRESSURE REGULATED VOLUME CONTROL     VT 620     Rate 18     Peep/cpap 5.0     pH, Arterial 7.528 (*) 7.350 - 7.450   pCO2 arterial 37.2  35.0 - 45.0 mmHg   pO2, Arterial 281.0 (*) 80.0 - 100.0 mmHg   Bicarbonate 30.8 (*) 20.0 - 24.0 mEq/L   TCO2 26.4  0 - 100 mmol/L   Acid-Base Excess 7.8 (*) 0.0 - 2.0 mmol/L   O2 Saturation 99.6     Patient temperature 37.0     Collection site BRACHIAL ARTERY     Drawn by [id number]     Sample type ARTERIAL  DRAW     Allens test (pass/fail) PASS  PASS  GLUCOSE, CAPILLARY     Status: Abnormal   Collection Time    02/11/13  5:10 PM      Result Value Range   Glucose-Capillary 113 (*) 70 - 99 mg/dL  BLOOD GAS, ARTERIAL     Status: Abnormal   Collection Time    02/11/13  5:30 PM      Result Value Range   FIO2 0.40     Delivery systems VENTILATOR     Mode PRESSURE REGULATED VOLUME CONTROL     VT 550     Rate 15     Peep/cpap 5.0     pH, Arterial 7.526 (*) 7.350 - 7.450   pCO2 arterial 37.0  35.0 - 45.0 mmHg   pO2, Arterial 61.3 (*) 80.0 - 100.0 mmHg   Bicarbonate 30.5 (*) 20.0 - 24.0 mEq/L   TCO2 25.8  0 - 100 mmol/L   Acid-Base Excess 7.6 (*) 0.0 - 2.0 mmol/L   O2 Saturation 94.3     Patient temperature 37.0     Collection site BRACHIAL ARTERY     Drawn by COLLECTED BY RT     Sample type ARTERIAL DRAW     Allens test (pass/fail) PASS  PASS  GLUCOSE, CAPILLARY     Status: Abnormal   Collection Time    02/11/13  8:34 PM      Result Value Range   Glucose-Capillary 136 (*) 70 - 99  mg/dL  URINALYSIS, ROUTINE W REFLEX MICROSCOPIC     Status: Abnormal   Collection Time    02/11/13 10:28 PM      Result Value Range   Color, Urine ORANGE (*) YELLOW   Comment: BIOCHEMICALS MAY BE AFFECTED BY COLOR   APPearance HAZY (*) CLEAR   Specific Gravity, Urine 1.022  1.005 - 1.030   pH 6.0  5.0 - 8.0   Glucose, UA NEGATIVE  NEGATIVE mg/dL   Hgb urine dipstick NEGATIVE  NEGATIVE   Bilirubin Urine SMALL (*) NEGATIVE   Ketones, ur NEGATIVE  NEGATIVE mg/dL   Protein, ur NEGATIVE  NEGATIVE mg/dL   Urobilinogen, UA 4.0 (*) 0.0 - 1.0 mg/dL   Nitrite NEGATIVE  NEGATIVE   Leukocytes, UA SMALL (*) NEGATIVE  URINE MICROSCOPIC-ADD ON     Status: None   Collection Time    02/11/13 10:28 PM      Result Value Range   Squamous Epithelial / LPF RARE  RARE   WBC, UA 0-2  <3 WBC/hpf   Bacteria, UA RARE  RARE   Urine-Other MUCOUS PRESENT    GLUCOSE, CAPILLARY     Status: Abnormal   Collection Time     02/11/13 11:54 PM      Result Value Range   Glucose-Capillary 150 (*) 70 - 99 mg/dL   Comment 1 Notify RN    COMPREHENSIVE METABOLIC PANEL     Status: Abnormal   Collection Time    02/12/13  4:30 AM      Result Value Range   Sodium 140  135 - 145 mEq/L   Potassium 2.4 (*) 3.5 - 5.1 mEq/L   Comment: CRITICAL RESULT CALLED TO, READ BACK BY AND VERIFIED WITH:     GGARLAND RN AT 0530 ON [id number] BY DLONG   Chloride 101  96 - 112 mEq/L   CO2 32  19 - 32 mEq/L   Glucose, Bld 131 (*) 70 - 99 mg/dL   BUN 13  6 - 23 mg/dL   Creatinine, Ser 5.78  0.50 - 1.35 mg/dL   Calcium 8.5  8.4 - 46.9 mg/dL   Total Protein 5.9 (*) 6.0 - 8.3 g/dL   Albumin 2.1 (*) 3.5 - 5.2 g/dL   AST 25  0 - 37 U/L   ALT 19  0 - 53 U/L   Alkaline Phosphatase 47  39 - 117 U/L   Total Bilirubin 1.9 (*) 0.3 - 1.2 mg/dL   GFR calc non Af Amer 82 (*) >90 mL/min   GFR calc Af Amer >90  >90 mL/min   Comment:            The eGFR has been calculated     using the CKD EPI equation.     This calculation has not been     validated in all clinical     situations.     eGFR's persistently     <90 mL/min signify     possible Chronic Kidney Disease.  MAGNESIUM     Status: None   Collection Time    02/12/13  4:30 AM      Result Value Range   Magnesium 2.0  1.5 - 2.5 mg/dL  PHOSPHORUS     Status: Abnormal   Collection Time    02/12/13  4:30 AM      Result Value Range   Phosphorus 1.1 (*) 2.3 - 4.6 mg/dL  CHOLESTEROL, TOTAL     Status: None   Collection Time  02/12/13  4:30 AM      Result Value Range   Cholesterol 101  0 - 200 mg/dL  TRIGLYCERIDES     Status: None   Collection Time    02/12/13  4:30 AM      Result Value Range   Triglycerides 88  <150 mg/dL  CBC     Status: Abnormal   Collection Time    02/12/13  4:30 AM      Result Value Range   WBC 10.6 (*) 4.0 - 10.5 K/uL   RBC 4.45  4.22 - 5.81 MIL/uL   Hemoglobin 12.2 (*) 13.0 - 17.0 g/dL   HCT 16.1 (*) 09.6 - 04.5 %   MCV 85.8  78.0 - 100.0 fL   MCH 27.4   26.0 - 34.0 pg   MCHC 31.9  30.0 - 36.0 g/dL   RDW 40.9 (*) 81.1 - 91.4 %   Platelets 232  150 - 400 K/uL  DIFFERENTIAL     Status: Abnormal   Collection Time    02/12/13  4:30 AM      Result Value Range   Neutrophils Relative % 77  43 - 77 %   Neutro Abs 8.1 (*) 1.7 - 7.7 K/uL   Lymphocytes Relative 12  12 - 46 %   Lymphs Abs 1.3  0.7 - 4.0 K/uL   Monocytes Relative 11  3 - 12 %   Monocytes Absolute 1.1 (*) 0.1 - 1.0 K/uL   Eosinophils Relative 0  0 - 5 %   Eosinophils Absolute 0.0  0.0 - 0.7 K/uL   Basophils Relative 0  0 - 1 %   Basophils Absolute 0.0  0.0 - 0.1 K/uL  PRO B NATRIURETIC PEPTIDE     Status: Abnormal   Collection Time    02/12/13  4:30 AM      Result Value Range   Pro B Natriuretic peptide (BNP) 843.9 (*) 0 - 450 pg/mL    Radiology/Results: Dg Chest Port 1 View  02/11/2013   *RADIOLOGY REPORT*  Clinical Data: Intubation, respiratory failure  PORTABLE CHEST - 1 VIEW  Comparison: 01/15/2013  Findings: Endotracheal tube 4 cm above the carina.  Right IJ central line tip at the SVC/RA junction level.  Pleural effusions persist with basilar atelectasis / consolidation.  The lungs remain clear.  No pneumothorax.  IMPRESSION: Stable effusions and basilar atelectasis / consolidation.  Support apparatus as above.   Original Report Authenticated By: Judie Petit. Miles Costain, M.D.   Dg Chest Port 1 View  02/11/2013   *RADIOLOGY REPORT*  Clinical Data: Follow up aspiration; shortness of breath.  PORTABLE CHEST - 1 VIEW  Comparison: Chest radiograph performed 02/10/2013  Findings: No definite residual contrast is characterized at the lung bases.  Small bilateral pleural effusions are seen, with bibasilar airspace opacities, possibly reflecting mild pulmonary edema.  Underlying vascular congestion is seen.  Pneumonia might have a similar appearance.  The cardiomediastinal silhouette is mildly enlarged.  No acute osseous abnormalities are seen.  IMPRESSION:  1.  No definite residual contrast seen at  the lung bases.  Small bilateral pleural effusions, with bibasilar airspace opacities, possibly reflecting mild pulmonary edema.  Pneumonia might have a similar appearance. 2.  Underlying vascular congestion and mild cardiomegaly.   Original Report Authenticated By: Tonia Ghent, M.D.   Dg Chest Port 1 View  02/10/2013   *RADIOLOGY REPORT*  Clinical Data: Contrast aspiration  PORTABLE CHEST - 1 VIEW  Comparison: 02/10/2013 at 1051 hours  Findings: Low  lung volumes with bibasilar atelectasis. Small bilateral pleural effusions are suspected.  Aspirated contrast within the bilateral lower lobe pulmonary bronchi.  No pneumothorax.  Cardiomegaly.  Cholecystectomy clips.  IMPRESSION: Low lung volumes with bibasilar atelectasis and probable small bilateral pleural effusions.  Aspirated contrast within the bilateral lower lobe pulmonary bronchi.   Original Report Authenticated By: Charline Bills, M.D.   Dg Chest Port 1 View  02/10/2013   *RADIOLOGY REPORT*  Clinical Data: Post aspiration of gastrographin, recent upper GI  PORTABLE CHEST - 1 VIEW  Comparison: 02/09/2013; Upper GI - earlier same day;  Findings:  Grossly unchanged enlarged cardiac silhouette and mediastinal contours.  Interval extubation.  No pneumothorax.  Moderate to large amount of enteric contrast remains within the mid and distal esophagus.  Contrast has been aspirated and is seen within the bilateral lower lobe bronchi.  Lung volumes remain reduced with grossly unchanged small bilateral effusions and associated bibasilar opacities, right greater than left.  Mild pulmonary venous congestion without frank evidence of edema.  Unchanged bones.  Post cholecystectomy.  IMPRESSION: 1.  Interval extubation.  No pneumothorax. 2.  Moderate to large amount residual contrast within the mid and distal esophagus.  Aspirated contrast is seen within bilateral lower lobe segmental and subsegmental bronchi. 3.  Persistently reduced lung volumes with unchanged  small bilateral effusions and associated bibasilar opacities, right greater than left, atelectasis versus infiltrate.   Original Report Authenticated By: Tacey Ruiz, MD   Dg Kayleen Memos W/water Sol Cm  02/10/2013   *RADIOLOGY REPORT*  Clinical Data:Status post hiatal hernia repair  ESOPHAGUS/BARIUM SWALLOW/TABLET STUDY  Fluoroscopy Time: 2 minutes 30 seconds.  Comparison: Chest radiograph from 02/09/2013  Findings: Multiple attempts were made to have the patient ingested the contrast material.  The patient was not able to use a straw and requested ingestion of the contrast without the use of straw.  Only a small amount of contrast was swallowed when aspiration into the lower airway and bronchi was identified.  Examination was promptly terminated.  There was insufficient contrast material ingested to adequately assess patency of the fundoplication wrap.  No extravasation of contrast material noted.  IMPRESSION:  1.  Aspiration of contrast. 2.  Insufficient ingestion of contrast material to assess patency of the fundoplication wrap.   Original Report Authenticated By: Signa Kell, M.D.    Anti-infectives: Anti-infectives   Start     Dose/Rate Route Frequency Ordered Stop   02/11/13 2300  piperacillin-tazobactam (ZOSYN) IVPB 3.375 g     3.375 g 12.5 mL/hr over 240 Minutes Intravenous Every 8 hours 02/11/13 2156     02/11/13 2300  vancomycin (VANCOCIN) IVPB 750 mg/150 ml premix     750 mg 150 mL/hr over 60 Minutes Intravenous Every 12 hours 02/11/13 2156     02/11/13 2230  micafungin (MYCAMINE) 100 mg in sodium chloride 0.9 % 100 mL IVPB     100 mg 100 mL/hr over 1 Hours Intravenous Daily at bedtime 02/11/13 2137     02/08/13 1700  ceFAZolin (ANCEF) IVPB 1 g/50 mL premix     1 g 100 mL/hr over 30 Minutes Intravenous Every 6 hours 02/08/13 1639 02/09/13 0539   02/08/13 0650  ceFAZolin (ANCEF) IVPB 2 g/50 mL premix     2 g 100 mL/hr over 30 Minutes Intravenous On call to O.R. 02/08/13 0650 02/08/13 0957       Assessment/Plan: Problem List: Patient Active Problem List   Diagnosis Date Noted  . Protein-calorie malnutrition, severe 02/11/2013  .  Atrial fibrillation 02/08/2013  . Acute respiratory failure 02/08/2013  . GERD (gastroesophageal reflux disease)   . Hiatal hernia-recurrent s/p laparoscopic repair and Nissen fundoplication on 02/08/13 01/11/2013  . Bell's palsy 01/01/2013  . Iron deficiency anemia 09/01/2012  . S/P repair of paraesophageal hernia 09/01/2012  . Hx of adenomatous colonic polyps 09/01/2012  . BPH (benign prostatic hyperplasia) 09/01/2012  . B12 deficiency 09/01/2012  . Macular degeneration 09/01/2012  . S/P cholecystectomy 09/01/2012    Still on ventilator but cardiac status improved.  Will defer to CCM on vent management.  Discussed overall condition with his wife.  Hopefully the aspiration issue and cardiac issues will resolve.  I think that the edema in the wrap will go down and native motility in the esophagus will improve.  The preexistent proximal dysphagia which may have been related to the Bell's palsy may have contributed more to the present issue with aspiration.  Overall he is better. 4 Days Post-Op    LOS: 4 days   Matt B. Daphine Deutscher, MD, [facility] [phone] beeper [phone]  02/12/2013 8:28 AM

## 2013-02-12 NOTE — Progress Notes (Signed)
Pt has remained calm during the night. Has tolerated the vent.  VSS.  Abnormal lab (potassium addressed) per MD. Conley Rolls, RN

## 2013-02-12 NOTE — Progress Notes (Signed)
[facility] ADULT ICU REPLACEMENT PROTOCOL FOR AM LAB REPLACEMENT ONLY  The patient does not apply for the [facility] Adult ICU Electrolyte Replacment Protocol based on the criteria listed below:   1. Is GFR >/= 40 ml/min? yes  Patient's GFR today is 82 2. Is urine output >/= 0.5 ml/kg/hr for the last 6 hours? no Patient's UOP is 0.3 ml/kg/hr 3. Is BUN < 60 mg/dL? yes  Patient's BUN today is 13 4. Abnormal electrolyte(s): K+2.4 5. Ordered repletion with: NONE 6. If a panic level lab has been reported, has the CCM MD in charge been notified? yes.   Physician:  Autumn Messing, Renae Fickle Hilliard 02/12/2013 6:20 AM

## 2013-02-12 NOTE — Progress Notes (Signed)
PARENTERAL NUTRITION CONSULT NOTE - FOLLOW UP  Pharmacy Consult for TPN Indication: Intolerance to enteral feeding  No Known Allergies  Patient Measurements: Height: 5\' 10"  (177.8 cm) Weight: 193 lb 12.6 oz (87.9 kg) IBW/kg (Calculated) : 73  Vital Signs: Temp: 99.8 F (37.7 C) (05/31 0400) Temp src: Oral (05/31 0400) BP: 110/62 mmHg (05/31 0600) Pulse Rate: 62 (05/31 0630) Intake/Output from previous day: 05/30 0701 - 05/31 0700 In: 3387.5 [I.V.:1118.4; IV Piggyback:1629.1; TPN:640] Out: 3085 [Urine:3085]  Labs:  Recent Labs  02/11/13 0333 02/12/13 0430  WBC 11.5* 10.6*  HGB 12.1* 12.2*  HCT 38.4* 38.2*  PLT 240 232     Recent Labs  02/10/13 0340 02/11/13 0333 02/12/13 0430  NA 139 138 140  K 3.5 3.2* 2.4*  CL 103 100 101  CO2 30 32 32  GLUCOSE 103* 112* 131*  BUN 11 7 13   CREATININE 0.79 0.59 0.77  CALCIUM 8.8 8.4 8.5  MG 1.8  --  2.0  PHOS 2.8  --  1.1*  PROT  --   --  5.9*  ALBUMIN  --   --  2.1*  AST  --   --  25  ALT  --   --  19  ALKPHOS  --   --  47  BILITOT  --   --  1.9*   Estimated Creatinine Clearance: 79.5 ml/min (by C-G formula based on Cr of 0.77).    Recent Labs  02/11/13 1710 02/11/13 2034 02/11/13 2354  GLUCAP 113* 136* 150*    Medications:  Scheduled:  . antiseptic oral rinse  15 mL Mouth Rinse QID  . chlorhexidine  15 mL Mouth Rinse BID  . heparin subcutaneous  5,000 Units Subcutaneous Q8H  . insulin aspart  0-9 Units Subcutaneous Q4H  . micafungin (MYCAMINE) IV  100 mg Intravenous QHS  . pantoprazole (PROTONIX) IV  40 mg Intravenous QHS  . piperacillin-tazobactam (ZOSYN)  IV  3.375 g Intravenous Q8H  . potassium chloride  10 mEq Intravenous Q1 Hr x 4  . potassium phosphate IVPB (mEq)  40 mEq Intravenous Once  . vancomycin  750 mg Intravenous Q12H   Infusions:  . dextrose 5 % and 0.9% NaCl 35 mL/hr at 02/11/13 1800  . Marland KitchenTPN (CLINIMIX-E) Adult 40 mL/hr at 02/11/13 1757   And  . fat emulsion 250 mL (02/11/13 1757)   . fentaNYL infusion INTRAVENOUS 25 mcg/hr (02/12/13 0006)  . phenylephrine (NEO-SYNEPHRINE) Adult infusion 5 mcg/min (02/12/13 0216)    Nutritional Goals:   RD recs (5/30): 1980-2310 Kcal, 110-128 g Protein, 2.5 L fluid  Clinimix 5/20 at 83 ml/hr + Lipids MWF to provide 100 g protein/day and an average of  1959 Kcal/day.   Current nutrition:   Diet: NPO  IVF: D5 NS at 35 ml/hr  Clinimix E 5/20 at 40 ml/hr  CBGs & Insulin requirements past 24 hours:   CBGs ordered q4h:  Range 99-150  Sensitive SSI :  3 units  Assessment:  79 yo M admit 5/27 s/p laparoscopic hernia repair and Nissen fundoplication on 5/27.  The pt had aspiration during UGI swallow evaluation on 5/29.  He has a history of poor PO intake prior to admission and has severe malnutrition.  IV team unable to place PICC; CCM to place central line on 5/30. 5/31:  Day #2 TPN.  Renal function:  SCr is stable/WNL  Hepatic function: ALT/AST WNL (5/31), Tbili elevated at 1.9 (5/31)  Electrolytes: K and Phos low (replaced per [facility] 5/31  am:  Total 80 mEq K and 27 mmol Phos).  Magnesium and other elytes WNL.    Pre-Albumin: pending  TG/Cholesterol: WNL (5/31)  Glucose:  Increased after TPN started, but remains w/in goal with minimal SSI use.  Plan:  At 1800 tonight Continue Clinimix E 5/20 at 40 ml/hr - do not increase rate until K and Phos abnormalities are corrected. Repeat Bmet and Phos tonight and with AM labs Fat emulsion at 10 ml/hr (MWF only due to ongoing shortage). TNA to contain standard multivitamins and trace elements (MWF only due to ongoing shortage). Continue IVF at 35 ml/hr. Continue sensitive SSI q4h (CBGs q4h) TNA lab panels on Mondays & Thursdays. F/u daily.   Lynann Beaver PharmD, BCPS Pager [phone] 02/12/2013 9:07 AM

## 2013-02-12 NOTE — Progress Notes (Signed)
eLink Physician Progress Note and Electrolyte Replacement  Patient Name: Victor Sutton DOB: 10/09/1927 MRN: [id number]  Date of Service  02/12/2013   HPI/Events of Note    Recent Labs Lab 02/08/13 1641  02/08/13 1643 02/09/13 0330 02/10/13 0340 02/11/13 0333 02/12/13 0430 02/12/13 1815  NA  --   < > 142 137 139 138 140 139  K  --   < > 3.3* 3.8 3.5 3.2* 2.4* 2.8*  CL  --   < > 101 101 103 100 101 103  CO2  --   < > 22 27 30  32 32 29  GLUCOSE  --   < > 204* 185* 103* 112* 131* 120*  BUN  --   < > 13 12 11 7 13 11   CREATININE  --   < > 0.67 0.71 0.79 0.59 0.77 0.69  CALCIUM  --   < > 9.1 9.0 8.8 8.4 8.5 8.1*  MG  --   --  1.8 1.8 1.8  --  2.0  --   PHOS 3.4  --   --   --  2.8  --  1.1* 2.3  < > = values in this interval not displayed.  Estimated Creatinine Clearance: 79.5 ml/min (by C-G formula based on Cr of 0.69).  Intake/Output     05/31 0701 - 06/01 0700   I.V. (mL/kg) 23.1 (0.3)   IV Piggyback 500   TPN 600   Total Intake(mL/kg) 1123.1 (12.8)   Urine (mL/kg/hr) 630 (0.5)   Total Output 630   Net +493.1        - I/O DETAILED x 24h      - I/O THIS SHIFT    ASSESSMENT Low K - severe  eICURN Interventions  kcl 10 x 6 runs Check mag - add on   ASSESSMENT: MAJOR ELECTROLYTE      Dr. Kalman Shan, M.D., [facility].C.P Pulmonary and Critical Care Medicine Staff Physician [facility] Pulmonary and Critical Care Pager: [phone], If no answer or between  15:00h - 7:00h: call [phone]  02/12/2013 8:03 PM

## 2013-02-12 NOTE — Progress Notes (Signed)
Hypokalemia and hypophosphatemia   K and Ph replaced  

## 2013-02-12 NOTE — Progress Notes (Signed)
Subjective:  He is intubated on the respirator and is able to write statements on a sheet. He feels as if there is a full feeling in his throat. Also complained of sputtering yesterday but on clearer what he means by this. Mildly short of breath. He aspirated Gastrografin yesterday and required a bronchoscopy and central line and reintubation. He became somewhat hypotensive and also was in atrial fibrillation and was given a bolus of amiodarone.   Objective:  Vital Signs in the last 24 hours: BP 110/62  Pulse 62  Temp(Src) 99.8 F (37.7 C) (Oral)  Resp 15  Ht 5\' 10"  (1.778 m)  Wt 87.9 kg (193 lb 12.6 oz)  BMI 27.81 kg/m2  SpO2 94%  Physical Exam: Elderly male who is intubated on the ventilator but is alert and awake and able to communicate and follow commands Lungs:   reduced breath sounds Cardiac:  Regular rhythm, normal S1 and S2, no S3 Abdomen:  Soft, nontender, no masses Extremities:  No edema present  Intake/Output from previous day: 05/30 0701 - 05/31 0700 In: 3387.5 [I.V.:1118.4; IV Piggyback:1629.1; TPN:640] Out: 3085 [Urine:3085] Weight Filed Weights   02/10/13 0400 02/11/13 0600 02/12/13 0500  Weight: 92.1 kg (203 lb 0.7 oz) 91.5 kg (201 lb 11.5 oz) 87.9 kg (193 lb 12.6 oz)    Lab Results: Basic Metabolic Panel:  Recent Labs  78/29/56 0333 02/12/13 0430  NA 138 140  K 3.2* 2.4*  CL 100 101  CO2 32 32  GLUCOSE 112* 131*  BUN 7 13  CREATININE 0.59 0.77    CBC:  Recent Labs  02/11/13 0333 02/12/13 0430  WBC 11.5* 10.6*  NEUTROABS  --  8.1*  HGB 12.1* 12.2*  HCT 38.4* 38.2*  MCV 87.5 85.8  PLT 240 232    BNP    Component Value Date/Time   PROBNP 843.9* 02/12/2013 0430   Telemetry: Currently normal sinus rhythm  Assessment/Plan:  1. Recurrent atrial arrhythmias. He is currently in sinus rhythm today. 2. Respiratory failure with intubation following aspiration of Gastrografin 23 prior abdominal surgery  Recommendations:  Use beta  blockers as needed and start back on day maintenance dose when he can take by mouth.     Darden Palmer  MD [facility] Cardiology  02/12/2013, 8:27 AM

## 2013-02-12 NOTE — Progress Notes (Signed)
ANTIBIOTIC CONSULT NOTE - follow up  Pharmacy Consult for Vancomycin/Zosyn Indication: Aspiration PNA/HCAP  No Known Allergies  Patient Measurements: Height: 5\' 10"  (177.8 cm) Weight: 193 lb 12.6 oz (87.9 kg) IBW/kg (Calculated) : 73   Vital Signs: Temp: 99.8 F (37.7 C) (05/31 0800) Temp src: Rectal (05/31 0800) BP: 145/56 mmHg (05/31 0900) Pulse Rate: 79 (05/31 0900) Intake/Output from previous day: 05/30 0701 - 05/31 0700 In: 3387.5 [I.V.:1118.4; IV Piggyback:1629.1; TPN:640] Out: 3085 [Urine:3085]  Labs:  Recent Labs  02/10/13 0340 02/11/13 0333 02/12/13 0430  WBC  --  11.5* 10.6*  HGB  --  12.1* 12.2*  PLT  --  240 232  CREATININE 0.79 0.59 0.77   Estimated Creatinine Clearance: 79.5 ml/min (by C-G formula based on Cr of 0.77).    Medications:  Anti-infectives   Start     Dose/Rate Route Frequency Ordered Stop   02/12/13 1000  vancomycin (VANCOCIN) IVPB 1000 mg/200 mL premix     1,000 mg 200 mL/hr over 60 Minutes Intravenous Every 12 hours 02/12/13 0943     02/11/13 2300  piperacillin-tazobactam (ZOSYN) IVPB 3.375 g     3.375 g 12.5 mL/hr over 240 Minutes Intravenous Every 8 hours 02/11/13 2156     02/11/13 2300  vancomycin (VANCOCIN) IVPB 750 mg/150 ml premix  Status:  Discontinued     750 mg 150 mL/hr over 60 Minutes Intravenous Every 12 hours 02/11/13 2156 02/12/13 0943   02/11/13 2230  micafungin (MYCAMINE) 100 mg in sodium chloride 0.9 % 100 mL IVPB     100 mg 100 mL/hr over 1 Hours Intravenous Daily at bedtime 02/11/13 2137     02/08/13 1700  ceFAZolin (ANCEF) IVPB 1 g/50 mL premix     1 g 100 mL/hr over 30 Minutes Intravenous Every 6 hours 02/08/13 1639 02/09/13 0539   02/08/13 0650  ceFAZolin (ANCEF) IVPB 2 g/50 mL premix     2 g 100 mL/hr over 30 Minutes Intravenous On call to O.R. 02/08/13 1610 02/08/13 0957      Assessment: 79 yo M with recurrent episodes of aspiration (dysphagia possibly related to Bell's palsy) that required  intubation and bronch on 5/30.  TPN was also started on 5/30 for nutritional support.  He developed a fever 5/30 pm and pharmacy is consulted for vancomycin and zosyn dosing.  Also started on micafungin per MD d/t risks w/ TPN (just started 5/30).  Day #2 Vancomycin and Zosyn, micafungin per MD  SCr is 0.77, CrCl ~ 75 ml/min normalized  Goal of Therapy:  Vancomycin trough level 15-20 mcg/ml  Plan:   Continue Zosyn 3.375g IV Q8H infused over 4hrs. Increase to Vancomycin 1g IV q12h. Measure Vanc trough at steady state. Follow up renal fxn and culture results.   Lynann Beaver PharmD, BCPS Pager [phone] 02/12/2013 9:48 AM

## 2013-02-13 ENCOUNTER — Inpatient Hospital Stay (HOSPITAL_COMMUNITY): Payer: Medicare Other

## 2013-02-13 DIAGNOSIS — E43 Unspecified severe protein-calorie malnutrition: Secondary | ICD-10-CM

## 2013-02-13 DIAGNOSIS — T17908A Unspecified foreign body in respiratory tract, part unspecified causing other injury, initial encounter: Secondary | ICD-10-CM

## 2013-02-13 LAB — BASIC METABOLIC PANEL
BUN: 13 mg/dL (ref 6–23)
BUN: 14 mg/dL (ref 6–23)
CO2: 28 mEq/L (ref 19–32)
CO2: 29 mEq/L (ref 19–32)
Calcium: 8.3 mg/dL — ABNORMAL LOW (ref 8.4–10.5)
Calcium: 8.7 mg/dL (ref 8.4–10.5)
Chloride: 104 mEq/L (ref 96–112)
Chloride: 107 mEq/L (ref 96–112)
Creatinine, Ser: 0.83 mg/dL (ref 0.50–1.35)
Creatinine, Ser: 0.96 mg/dL (ref 0.50–1.35)
GFR calc Af Amer: >90 mL/min (ref >90)
GFR calc Af Amer: 87 mL/min — ABNORMAL LOW (ref >90)
GFR calc non Af Amer: 80 mL/min — ABNORMAL LOW (ref >90)
GFR calc non Af Amer: 75 mL/min — ABNORMAL LOW (ref >90)
Glucose, Bld: 127 mg/dL — ABNORMAL HIGH (ref 70–99)
Glucose, Bld: 125 mg/dL — ABNORMAL HIGH (ref 70–99)
Potassium: 2.8 mEq/L — ABNORMAL LOW (ref 3.5–5.1)
Potassium: 3.5 mEq/L (ref 3.5–5.1)
Sodium: 140 mEq/L (ref 135–145)
Sodium: 144 mEq/L (ref 135–145)

## 2013-02-13 LAB — PHOSPHORUS: Phosphorus: 2.9 mg/dL (ref 2.3–4.6)

## 2013-02-13 LAB — GLUCOSE, CAPILLARY
Glucose-Capillary: 133 mg/dL — ABNORMAL HIGH (ref 70–99)
Glucose-Capillary: 130 mg/dL — ABNORMAL HIGH (ref 70–99)
Glucose-Capillary: 156 mg/dL — ABNORMAL HIGH (ref 70–99)
Glucose-Capillary: 127 mg/dL — ABNORMAL HIGH (ref 70–99)
Glucose-Capillary: 138 mg/dL — ABNORMAL HIGH (ref 70–99)

## 2013-02-13 LAB — URINE CULTURE
Colony Count: NO GROWTH
Culture: NO GROWTH

## 2013-02-13 LAB — CULTURE, BAL-QUANTITATIVE
Colony Count: 30000
Gram Stain: NONE SEEN

## 2013-02-13 LAB — MAGNESIUM: Magnesium: 1.8 mg/dL (ref 1.5–2.5)

## 2013-02-13 LAB — CULTURE, BAL-QUANTITATIVE W GRAM STAIN

## 2013-02-13 MED ORDER — HYDRALAZINE HCL 20 MG/ML IJ SOLN
10.0000 mg | INTRAMUSCULAR | Status: DC | PRN
  Administered 2013-02-13 – 2013-02-14 (×2): 20 mg via INTRAVENOUS
  Filled 2013-02-13 (×2): qty 1

## 2013-02-13 MED ORDER — ALBUTEROL SULFATE (5 MG/ML) 0.5% IN NEBU
2.5000 mg | INHALATION_SOLUTION | Freq: Four times a day (QID) | RESPIRATORY_TRACT | Status: DC
  Administered 2013-02-13 – 2013-02-14 (×4): 2.5 mg via RESPIRATORY_TRACT
  Filled 2013-02-13 (×4): qty 0.5

## 2013-02-13 MED ORDER — POTASSIUM CHLORIDE 10 MEQ/50ML IV SOLN
10.0000 meq | INTRAVENOUS | Status: DC
  Administered 2013-02-13 (×3): 10 meq via INTRAVENOUS
  Filled 2013-02-13: qty 300

## 2013-02-13 MED ORDER — CLINIMIX E/DEXTROSE (5/20) 5 % IV SOLN
INTRAVENOUS | Status: AC
  Administered 2013-02-13: 18:00:00 via INTRAVENOUS
  Filled 2013-02-13: qty 1000

## 2013-02-13 MED ORDER — FUROSEMIDE 10 MG/ML IJ SOLN
40.0000 mg | Freq: Once | INTRAMUSCULAR | Status: AC
  Administered 2013-02-13: 40 mg via INTRAVENOUS
  Filled 2013-02-13 (×2): qty 4

## 2013-02-13 MED ORDER — POTASSIUM CHLORIDE 10 MEQ/50ML IV SOLN
10.0000 meq | INTRAVENOUS | Status: AC
  Administered 2013-02-13 (×6): 10 meq via INTRAVENOUS
  Filled 2013-02-13: qty 200

## 2013-02-13 MED ORDER — ALBUTEROL SULFATE (5 MG/ML) 0.5% IN NEBU
2.5000 mg | INHALATION_SOLUTION | RESPIRATORY_TRACT | Status: DC | PRN
  Filled 2013-02-13: qty 0.5

## 2013-02-13 MED ORDER — POTASSIUM CHLORIDE 10 MEQ/50ML IV SOLN
10.0000 meq | INTRAVENOUS | Status: DC
  Administered 2013-02-13: 10 meq via INTRAVENOUS

## 2013-02-13 MED ORDER — FENTANYL CITRATE 0.05 MG/ML IJ SOLN
12.5000 ug | INTRAMUSCULAR | Status: DC | PRN
  Administered 2013-02-14: 12.5 ug via INTRAVENOUS
  Filled 2013-02-13: qty 2

## 2013-02-13 MED ORDER — METOPROLOL TARTRATE 1 MG/ML IV SOLN
2.5000 mg | INTRAVENOUS | Status: DC | PRN
  Administered 2013-02-13: 2.5 mg via INTRAVENOUS
  Filled 2013-02-13 (×2): qty 5

## 2013-02-13 NOTE — Plan of Care (Signed)
Problem: Phase II Progression Outcomes Goal: Date pt extubated/weaned off vent Outcome: Completed/Met Date Met:  02/13/13 Pt weaned & extubated today.

## 2013-02-13 NOTE — Progress Notes (Signed)
Patient ID: Victor Sutton, male   DOB: August 11, 1928, 79 y.o.   MRN: [id number] [facility] Progress Note:   5 Days Post-Op  Subjective: Mental status is alert on ventilator Objective: Vital signs in last 24 hours: Temp:  [98.8 F (37.1 C)-100.6 F (38.1 C)] 99 F (37.2 C) (06/01 0800) Pulse Rate:  [61-85] 69 (06/01 0700) Resp:  [15-24] 15 (06/01 0700) BP: (87-145)/(55-84) 109/68 mmHg (06/01 0700) SpO2:  [89 %-97 %] 96 % (06/01 0700) FiO2 (%):  [40 %-50 %] 40 % (06/01 0802)  Intake/Output from previous day: 05/31 0701 - 06/01 0700 In: 2423.1 [I.V.:478.1; IV Piggyback:825; TPN:1120] Out: 1070 [Urine:1070] Intake/Output this shift:    Physical Exam: Work of breathing is controlled by ventilator.  Weaning in progress.  Incisions OK.  No abdominal pain  Lab Results:  Results for orders placed during the hospital encounter of 02/08/13 (from the past 48 hour(s))  GLUCOSE, CAPILLARY     Status: None   Collection Time    02/11/13  8:31 AM      Result Value Range   Glucose-Capillary 99  70 - 99 mg/dL  GLUCOSE, CAPILLARY     Status: Abnormal   Collection Time    02/11/13  2:40 PM      Result Value Range   Glucose-Capillary 118 (*) 70 - 99 mg/dL   Comment 1 Documented in Chart     Comment 2 Notify RN    BLOOD GAS, ARTERIAL     Status: Abnormal   Collection Time    02/11/13  3:02 PM      Result Value Range   FIO2 1.00     Delivery systems VENTILATOR     Mode PRESSURE REGULATED VOLUME CONTROL     VT 620     Rate 18     Peep/cpap 5.0     pH, Arterial 7.528 (*) 7.350 - 7.450   pCO2 arterial 37.2  35.0 - 45.0 mmHg   pO2, Arterial 281.0 (*) 80.0 - 100.0 mmHg   Bicarbonate 30.8 (*) 20.0 - 24.0 mEq/L   TCO2 26.4  0 - 100 mmol/L   Acid-Base Excess 7.8 (*) 0.0 - 2.0 mmol/L   O2 Saturation 99.6     Patient temperature 37.0     Collection site BRACHIAL ARTERY     Drawn by [id number]     Sample type ARTERIAL DRAW     Allens test (pass/fail) PASS  PASS  CULTURE,  BAL-QUANTITATIVE     Status: None   Collection Time    02/11/13  3:31 PM      Result Value Range   Specimen Description BRONCHIAL ALVEOLAR LAVAGE     Special Requests NONE     Gram Stain       Value: NO WBC SEEN     NO SQUAMOUS EPITHELIAL CELLS SEEN     NO ORGANISMS SEEN   Colony Count 30,000 COLONIES/ML     Culture GRAM NEGATIVE RODS     Report Status PENDING    GLUCOSE, CAPILLARY     Status: Abnormal   Collection Time    02/11/13  5:10 PM      Result Value Range   Glucose-Capillary 113 (*) 70 - 99 mg/dL  BLOOD GAS, ARTERIAL     Status: Abnormal   Collection Time    02/11/13  5:30 PM      Result Value Range   FIO2 0.40     Delivery systems VENTILATOR     Mode  PRESSURE REGULATED VOLUME CONTROL     VT 550     Rate 15     Peep/cpap 5.0     pH, Arterial 7.526 (*) 7.350 - 7.450   pCO2 arterial 37.0  35.0 - 45.0 mmHg   pO2, Arterial 61.3 (*) 80.0 - 100.0 mmHg   Bicarbonate 30.5 (*) 20.0 - 24.0 mEq/L   TCO2 25.8  0 - 100 mmol/L   Acid-Base Excess 7.6 (*) 0.0 - 2.0 mmol/L   O2 Saturation 94.3     Patient temperature 37.0     Collection site BRACHIAL ARTERY     Drawn by COLLECTED BY RT     Sample type ARTERIAL DRAW     Allens test (pass/fail) PASS  PASS  GLUCOSE, CAPILLARY     Status: Abnormal   Collection Time    02/11/13  8:34 PM      Result Value Range   Glucose-Capillary 136 (*) 70 - 99 mg/dL  URINALYSIS, ROUTINE W REFLEX MICROSCOPIC     Status: Abnormal   Collection Time    02/11/13 10:28 PM      Result Value Range   Color, Urine ORANGE (*) YELLOW   Comment: BIOCHEMICALS MAY BE AFFECTED BY COLOR   APPearance HAZY (*) CLEAR   Specific Gravity, Urine 1.022  1.005 - 1.030   pH 6.0  5.0 - 8.0   Glucose, UA NEGATIVE  NEGATIVE mg/dL   Hgb urine dipstick NEGATIVE  NEGATIVE   Bilirubin Urine SMALL (*) NEGATIVE   Ketones, ur NEGATIVE  NEGATIVE mg/dL   Protein, ur NEGATIVE  NEGATIVE mg/dL   Urobilinogen, UA 4.0 (*) 0.0 - 1.0 mg/dL   Nitrite NEGATIVE  NEGATIVE    Leukocytes, UA SMALL (*) NEGATIVE  URINE MICROSCOPIC-ADD ON     Status: None   Collection Time    02/11/13 10:28 PM      Result Value Range   Squamous Epithelial / LPF RARE  RARE   WBC, UA 0-2  <3 WBC/hpf   Bacteria, UA RARE  RARE   Urine-Other MUCOUS PRESENT    FUNGUS CULTURE, BLOOD     Status: None   Collection Time    02/11/13 11:30 PM      Result Value Range   Specimen Description VEIN     Special Requests NONE     Culture NO FUNGUS ISOLATED;CULTURE IN PROGRESS FOR 7 DAYS     Report Status PENDING    GLUCOSE, CAPILLARY     Status: Abnormal   Collection Time    02/11/13 11:54 PM      Result Value Range   Glucose-Capillary 150 (*) 70 - 99 mg/dL   Comment 1 Notify RN    GLUCOSE, CAPILLARY     Status: Abnormal   Collection Time    02/12/13  3:46 AM      Result Value Range   Glucose-Capillary 133 (*) 70 - 99 mg/dL   Comment 1 Notify RN    COMPREHENSIVE METABOLIC PANEL     Status: Abnormal   Collection Time    02/12/13  4:30 AM      Result Value Range   Sodium 140  135 - 145 mEq/L   Potassium 2.4 (*) 3.5 - 5.1 mEq/L   Comment: CRITICAL RESULT CALLED TO, READ BACK BY AND VERIFIED WITH:     GGARLAND RN AT 0530 ON [id number] BY DLONG   Chloride 101  96 - 112 mEq/L   CO2 32  19 - 32 mEq/L  Glucose, Bld 131 (*) 70 - 99 mg/dL   BUN 13  6 - 23 mg/dL   Creatinine, Ser 1.61  0.50 - 1.35 mg/dL   Calcium 8.5  8.4 - 09.6 mg/dL   Total Protein 5.9 (*) 6.0 - 8.3 g/dL   Albumin 2.1 (*) 3.5 - 5.2 g/dL   AST 25  0 - 37 U/L   ALT 19  0 - 53 U/L   Alkaline Phosphatase 47  39 - 117 U/L   Total Bilirubin 1.9 (*) 0.3 - 1.2 mg/dL   GFR calc non Af Amer 82 (*) >90 mL/min   GFR calc Af Amer >90  >90 mL/min   Comment:            The eGFR has been calculated     using the CKD EPI equation.     This calculation has not been     validated in all clinical     situations.     eGFR's persistently     <90 mL/min signify     possible Chronic Kidney Disease.  PREALBUMIN     Status: Abnormal    Collection Time    02/12/13  4:30 AM      Result Value Range   Prealbumin 8.9 (*) 17.0 - 34.0 mg/dL  MAGNESIUM     Status: None   Collection Time    02/12/13  4:30 AM      Result Value Range   Magnesium 2.0  1.5 - 2.5 mg/dL  PHOSPHORUS     Status: Abnormal   Collection Time    02/12/13  4:30 AM      Result Value Range   Phosphorus 1.1 (*) 2.3 - 4.6 mg/dL  CHOLESTEROL, TOTAL     Status: None   Collection Time    02/12/13  4:30 AM      Result Value Range   Cholesterol 101  0 - 200 mg/dL  TRIGLYCERIDES     Status: None   Collection Time    02/12/13  4:30 AM      Result Value Range   Triglycerides 88  <150 mg/dL  CBC     Status: Abnormal   Collection Time    02/12/13  4:30 AM      Result Value Range   WBC 10.6 (*) 4.0 - 10.5 K/uL   RBC 4.45  4.22 - 5.81 MIL/uL   Hemoglobin 12.2 (*) 13.0 - 17.0 g/dL   HCT 04.5 (*) 40.9 - 81.1 %   MCV 85.8  78.0 - 100.0 fL   MCH 27.4  26.0 - 34.0 pg   MCHC 31.9  30.0 - 36.0 g/dL   RDW 91.4 (*) 78.2 - 95.6 %   Platelets 232  150 - 400 K/uL  DIFFERENTIAL     Status: Abnormal   Collection Time    02/12/13  4:30 AM      Result Value Range   Neutrophils Relative % 77  43 - 77 %   Neutro Abs 8.1 (*) 1.7 - 7.7 K/uL   Lymphocytes Relative 12  12 - 46 %   Lymphs Abs 1.3  0.7 - 4.0 K/uL   Monocytes Relative 11  3 - 12 %   Monocytes Absolute 1.1 (*) 0.1 - 1.0 K/uL   Eosinophils Relative 0  0 - 5 %   Eosinophils Absolute 0.0  0.0 - 0.7 K/uL   Basophils Relative 0  0 - 1 %   Basophils Absolute 0.0  0.0 - 0.1  K/uL  PRO B NATRIURETIC PEPTIDE     Status: Abnormal   Collection Time    02/12/13  4:30 AM      Result Value Range   Pro B Natriuretic peptide (BNP) 843.9 (*) 0 - 450 pg/mL  GLUCOSE, CAPILLARY     Status: Abnormal   Collection Time    02/12/13  8:16 AM      Result Value Range   Glucose-Capillary 139 (*) 70 - 99 mg/dL  GLUCOSE, CAPILLARY     Status: Abnormal   Collection Time    02/12/13 12:00 PM      Result Value Range    Glucose-Capillary 163 (*) 70 - 99 mg/dL  GLUCOSE, CAPILLARY     Status: Abnormal   Collection Time    02/12/13  4:02 PM      Result Value Range   Glucose-Capillary 128 (*) 70 - 99 mg/dL   Comment 1 Documented in Chart     Comment 2 Notify RN    BASIC METABOLIC PANEL     Status: Abnormal   Collection Time    02/12/13  6:15 PM      Result Value Range   Sodium 139  135 - 145 mEq/L   Potassium 2.8 (*) 3.5 - 5.1 mEq/L   Chloride 103  96 - 112 mEq/L   CO2 29  19 - 32 mEq/L   Glucose, Bld 120 (*) 70 - 99 mg/dL   BUN 11  6 - 23 mg/dL   Creatinine, Ser 1.19  0.50 - 1.35 mg/dL   Calcium 8.1 (*) 8.4 - 10.5 mg/dL   GFR calc non Af Amer 86 (*) >90 mL/min   GFR calc Af Amer >90  >90 mL/min   Comment:            The eGFR has been calculated     using the CKD EPI equation.     This calculation has not been     validated in all clinical     situations.     eGFR's persistently     <90 mL/min signify     possible Chronic Kidney Disease.  PHOSPHORUS     Status: None   Collection Time    02/12/13  6:15 PM      Result Value Range   Phosphorus 2.3  2.3 - 4.6 mg/dL  MAGNESIUM     Status: None   Collection Time    02/12/13  6:15 PM      Result Value Range   Magnesium 1.7  1.5 - 2.5 mg/dL  GLUCOSE, CAPILLARY     Status: Abnormal   Collection Time    02/12/13  8:00 PM      Result Value Range   Glucose-Capillary 128 (*) 70 - 99 mg/dL   Comment 1 Documented in Chart     Comment 2 Notify RN    GLUCOSE, CAPILLARY     Status: Abnormal   Collection Time    02/12/13 11:50 PM      Result Value Range   Glucose-Capillary 133 (*) 70 - 99 mg/dL   Comment 1 Documented in Chart     Comment 2 Notify RN    MAGNESIUM     Status: None   Collection Time    02/13/13  6:30 AM      Result Value Range   Magnesium 1.8  1.5 - 2.5 mg/dL  BASIC METABOLIC PANEL     Status: Abnormal   Collection Time    02/13/13  6:30 AM      Result Value Range   Sodium 140  135 - 145 mEq/L   Potassium 2.8 (*) 3.5 - 5.1  mEq/L   Chloride 104  96 - 112 mEq/L   CO2 28  19 - 32 mEq/L   Glucose, Bld 127 (*) 70 - 99 mg/dL   BUN 13  6 - 23 mg/dL   Creatinine, Ser 1.61  0.50 - 1.35 mg/dL   Calcium 8.3 (*) 8.4 - 10.5 mg/dL   GFR calc non Af Amer 80 (*) >90 mL/min   GFR calc Af Amer >90  >90 mL/min   Comment:            The eGFR has been calculated     using the CKD EPI equation.     This calculation has not been     validated in all clinical     situations.     eGFR's persistently     <90 mL/min signify     possible Chronic Kidney Disease.  PHOSPHORUS     Status: None   Collection Time    02/13/13  6:30 AM      Result Value Range   Phosphorus 2.9  2.3 - 4.6 mg/dL  GLUCOSE, CAPILLARY     Status: Abnormal   Collection Time    02/13/13  7:44 AM      Result Value Range   Glucose-Capillary 130 (*) 70 - 99 mg/dL   Comment 1 Documented in Chart     Comment 2 Notify RN      Radiology/Results: Dg Chest Port 1 View  02/11/2013   *RADIOLOGY REPORT*  Clinical Data: Intubation, respiratory failure  PORTABLE CHEST - 1 VIEW  Comparison: 01/15/2013  Findings: Endotracheal tube 4 cm above the carina.  Right IJ central line tip at the SVC/RA junction level.  Pleural effusions persist with basilar atelectasis / consolidation.  The lungs remain clear.  No pneumothorax.  IMPRESSION: Stable effusions and basilar atelectasis / consolidation.  Support apparatus as above.   Original Report Authenticated By: Judie Petit. Miles Costain, M.D.    Anti-infectives: Anti-infectives   Start     Dose/Rate Route Frequency Ordered Stop   02/12/13 1000  vancomycin (VANCOCIN) IVPB 1000 mg/200 mL premix     1,000 mg 200 mL/hr over 60 Minutes Intravenous Every 12 hours 02/12/13 0943     02/11/13 2300  piperacillin-tazobactam (ZOSYN) IVPB 3.375 g     3.375 g 12.5 mL/hr over 240 Minutes Intravenous Every 8 hours 02/11/13 2156     02/11/13 2300  vancomycin (VANCOCIN) IVPB 750 mg/150 ml premix  Status:  Discontinued     750 mg 150 mL/hr over 60 Minutes  Intravenous Every 12 hours 02/11/13 2156 02/12/13 0943   02/11/13 2230  micafungin (MYCAMINE) 100 mg in sodium chloride 0.9 % 100 mL IVPB     100 mg 100 mL/hr over 1 Hours Intravenous Daily at bedtime 02/11/13 2137     02/08/13 1700  ceFAZolin (ANCEF) IVPB 1 g/50 mL premix     1 g 100 mL/hr over 30 Minutes Intravenous Every 6 hours 02/08/13 1639 02/09/13 0539   02/08/13 0650  ceFAZolin (ANCEF) IVPB 2 g/50 mL premix     2 g 100 mL/hr over 30 Minutes Intravenous On call to O.R. 02/08/13 0650 02/08/13 0957      Assessment/Plan: Problem List: Patient Active Problem List   Diagnosis Date Noted  . Aspiration into airway 02/12/2013  . Protein-calorie malnutrition, severe 02/11/2013  .  Atrial fibrillation 02/08/2013  . Acute respiratory failure 02/08/2013  . GERD (gastroesophageal reflux disease)   . Hiatal hernia-recurrent s/p laparoscopic repair and Nissen fundoplication on 02/08/13 01/11/2013  . Bell's palsy 01/01/2013  . Iron deficiency anemia 09/01/2012  . S/P repair of paraesophageal hernia 09/01/2012  . Hx of adenomatous colonic polyps 09/01/2012  . BPH (benign prostatic hyperplasia) 09/01/2012  . B12 deficiency 09/01/2012  . Macular degeneration 09/01/2012  . S/P cholecystectomy 09/01/2012   Awaiting extubation; mechanical means to prevent aspiration and go slowly before resuming PO intake.  TNA in place.   5 Days Post-Op    LOS: 5 days   Matt B. Daphine Deutscher, MD, [facility] [phone] beeper [phone]  02/13/2013 8:29 AM

## 2013-02-13 NOTE — Progress Notes (Signed)
Subjective:  He is alert and calm today and does not complain of shortness of breath. Still has a somewhat full feeling in his throat.  Objective:  Vital Signs in the last 24 hours: BP 109/68  Pulse 69  Temp(Src) 98.8 F (37.1 C) (Axillary)  Resp 15  Ht 5\' 10"  (1.778 m)  Wt 87.9 kg (193 lb 12.6 oz)  BMI 27.81 kg/m2  SpO2 96%  Physical Exam: Elderly male who is intubated on the ventilator but is alert and awake and able to communicate and follow commands Lungs:   reduced breath sounds Cardiac:  Regular rhythm, normal S1 and S2, no S3 Abdomen:  Soft, nontender, no masses Extremities:  No edema present  Intake/Output from previous day: 05/31 0701 - 06/01 0700 In: 2423.1 [I.V.:478.1; IV Piggyback:825; TPN:1120] Out: 1070 [Urine:1070] Weight Filed Weights   02/10/13 0400 02/11/13 0600 02/12/13 0500  Weight: 92.1 kg (203 lb 0.7 oz) 91.5 kg (201 lb 11.5 oz) 87.9 kg (193 lb 12.6 oz)    Lab Results: Basic Metabolic Panel:  Recent Labs  16/10/96 1815 02/13/13 0630  NA 139 140  K 2.8* 2.8*  CL 103 104  CO2 29 28  GLUCOSE 120* 127*  BUN 11 13  CREATININE 0.69 0.83    CBC:  Recent Labs  02/11/13 0333 02/12/13 0430  WBC 11.5* 10.6*  NEUTROABS  --  8.1*  HGB 12.1* 12.2*  HCT 38.4* 38.2*  MCV 87.5 85.8  PLT 240 232    BNP    Component Value Date/Time   PROBNP 843.9* 02/12/2013 0430   Telemetry: Currently normal sinus rhythm, no atrial fibrillation overnight  Assessment/Plan:  1. Recurrent atrial arrhythmias. He is currently in sinus rhythm today. 2. Respiratory failure with intubation following aspiration of Gastrografin 3. Prior abdominal surgery 4. Hypokalemia in the process of being repleted  Recommendations:  May consider use of low-dose beta blockers.  Alternatively one to consider just giving them as needed.appears to the be maintaining sinus rhythm. Suspect that the atrial fibrillation was precipitated by the stress of aspiration.     Darden Palmer  MD [facility]  02/13/2013, 7:59 AM

## 2013-02-13 NOTE — Progress Notes (Signed)
Pt extubated per CCM order.  Tolerated well, stable throughout procedure.  Placed on 4L nasal cannula.

## 2013-02-13 NOTE — Progress Notes (Signed)
PULMONARY  / CRITICAL CARE MEDICINE  Name: Victor Sutton MRN: [id number] DOB: 1927-12-08    ADMISSION DATE:  02/08/2013 CONSULTATION DATE:  02/08/13  REFERRING MD :  Okey Dupre PRIMARY SERVICE: Surgery  CHIEF COMPLAINT:  S/P hiatal hernia repair  BRIEF PATIENT DESCRIPTION: 79 year old male s/p open repair of hiatal hernia 02/08/13 admitted following respiratory failure and A-fib with RVR in PACU.  LINES / TUBES: 5/27 ETT>>> 5/28 5/30 ETT >> 6/01 R IJ CVL 5/30 >>   CULTURES: BAL 5/30 >> 30 k serratia Blood 5/30 >>  ANTIBIOTICS: Micafungin 5/30 >> 6/01 Vanc 5/30 >>  Zosyn 5/30 >>    SIGNIFICANT EVENTS / STUDIES:  5/27 S/P open repair of hiatal hernia 02/10/2013: Gastrograffin swallow study > aspirated 5/30 Intubated for respiratory distress   SUBJECTIVE/OVERNIGHT/INTERVAL HX Passed SBT and extubated. Abdominal paradox noted suggesting diaphragmatic dysfunction. Marginal cough.   VITAL SIGNS: Temp:  [98.5 F (36.9 C)-100.6 F (38.1 C)] 98.5 F (36.9 C) (06/01 1200) Pulse Rate:  [65-108] 108 (06/01 1107) Resp:  [15-32] 32 (06/01 1107) BP: (87-206)/(55-94) 206/94 mmHg (06/01 1115) SpO2:  [87 %-97 %] 87 % (06/01 1107) FiO2 (%):  [40 %-50 %] 40 % (06/01 0802)  INTAKE / OUTPUT: Intake/Output     05/31 0701 - 06/01 0700 06/01 0701 - 06/02 0700   I.V. (mL/kg) 478.1 (5.4) 245 (2.8)   IV Piggyback 825 400   TPN 1120 280   Total Intake(mL/kg) 2423.1 (27.6) 925 (10.5)   Urine (mL/kg/hr) 1070 (0.5) 2525 (4)   Total Output 1070 2525   Net +1353.1 -1600          PHYSICAL EXAMINATION: General: NAD Neuro: no focal deficits HEENT: WNL Cardiovascular:  RRR s M Lungs: scattered rhonchi Abdomen:  Soft, NT, NABS Musculoskeletal:  MAE well and equal. No edema   LABS:  BMET    Component Value Date/Time   NA 140 02/13/2013 0630   K 2.8* 02/13/2013 0630   CL 104 02/13/2013 0630   CO2 28 02/13/2013 0630   GLUCOSE 127* 02/13/2013 0630   BUN 13 02/13/2013 0630   CREATININE 0.83  02/13/2013 0630   CALCIUM 8.3* 02/13/2013 0630   GFRNONAA 80* 02/13/2013 0630   GFRAA >90 02/13/2013 0630    CBC    Component Value Date/Time   WBC 10.6* 02/12/2013 0430   RBC 4.45 02/12/2013 0430   HGB 12.2* 02/12/2013 0430   HCT 38.2* 02/12/2013 0430   PLT 232 02/12/2013 0430   MCV 85.8 02/12/2013 0430   MCH 27.4 02/12/2013 0430   MCHC 31.9 02/12/2013 0430   RDW 16.6* 02/12/2013 0430   LYMPHSABS 1.3 02/12/2013 0430   MONOABS 1.1* 02/12/2013 0430   EOSABS 0.0 02/12/2013 0430   BASOSABS 0.0 02/12/2013 0430    CXR: improved infiltrates. Bilateral elevation of diaphragms  ASSESSMENT / PLAN:  PULMONARY A: Acute post op respiratory failure s/p repair of hiatal hernia. 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 P:   -Close monitoring in ICU -Chest percussion, NTS PRN, keep as upright as possible, encourage frequent cough -PRN BiPAP -Consider eval of diaphragm function when stronger and more stabilized   CARDIOVASCULAR A: A-flutter with RVR > NSR  P:  - Cont current Rx   RENAL A:  No active issues P:   -f/u bmet and replace electrolytes as needed -follow I&O trend  GASTROINTESTINAL A:  S/P repair of hiatal hernia and Nissen Fundoplication 02/10/13 Dysphagia  P:   -Cont TPN per CCS -Will need to  address dysphagia after intubation   HEMATOLOGIC A:  No active issues P:  -Monitor   INFECTIOUS A:  Aspiration PNA/pneumonitis P:   -Micro and abx as above   ENDOCRINE A: Hyperglycemia on TPN  P:   -Cont SSI   NEUROLOGIC A:  No issues P:   -minimize sedation/analgesia    Wife updated in detail  35 mins CCM  Billy Fischer, MD ; [facility] service Mobile [phone].  After 5:30 PM or weekends, call [phone]

## 2013-02-13 NOTE — Procedures (Signed)
Extubation Procedure Note  Patient Details:   Name: Victor Sutton DOB: 08-17-1928 MRN: [id number]   Airway Documentation:     Evaluation  O2 sats: 95 Complications: none Patient tolerated procedure well. Bilateral Breath Sounds: Clear Suctioning: Airway Able to speak  Revonda Humphrey 02/13/2013, 8:50 AM

## 2013-02-13 NOTE — Progress Notes (Signed)
PARENTERAL NUTRITION CONSULT NOTE - FOLLOW UP  Pharmacy Consult for TPN Indication: Intolerance to enteral feeding  No Known Allergies  Patient Measurements: Height: 5\' 10"  (177.8 cm) Weight: 193 lb 12.6 oz (87.9 kg) IBW/kg (Calculated) : 73  Vital Signs: Temp: 98.8 F (37.1 C) (06/01 0400) Temp src: Axillary (06/01 0400) BP: 109/68 mmHg (06/01 0700) Pulse Rate: 69 (06/01 0700) Intake/Output from previous day: 05/31 0701 - 06/01 0700 In: 2335.6 [I.V.:443.1; IV Piggyback:812.5; TPN:1080] Out: 1070 [Urine:1070]  Labs:  Recent Labs  02/11/13 0333 02/12/13 0430  WBC 11.5* 10.6*  HGB 12.1* 12.2*  HCT 38.4* 38.2*  PLT 240 232     Recent Labs  02/11/13 0333 02/12/13 0430 02/12/13 1815 02/13/13 0630  NA 138 140 139 140  K 3.2* 2.4* 2.8* 2.8*  CL 100 101 103 104  CO2 32 32 29 28  GLUCOSE 112* 131* 120* 127*  BUN 7 13 11 13   CREATININE 0.59 0.77 0.69 0.83  CALCIUM 8.4 8.5 8.1* 8.3*  MG  --  2.0 1.7 1.8  PHOS  --  1.1* 2.3 2.9  PROT  --  5.9*  --   --   ALBUMIN  --  2.1*  --   --   AST  --  25  --   --   ALT  --  19  --   --   ALKPHOS  --  47  --   --   BILITOT  --  1.9*  --   --   PREALBUMIN  --  8.9*  --   --   TRIG  --  88  --   --   CHOL  --  101  --   --    Estimated Creatinine Clearance: 79.5 ml/min (by C-G formula based on Cr of 0.69).    Recent Labs  02/12/13 1602 02/12/13 2000 02/12/13 2350  GLUCAP 128* 128* 133*    Medications:  Scheduled:  . antiseptic oral rinse  15 mL Mouth Rinse QID  . chlorhexidine  15 mL Mouth Rinse BID  . heparin subcutaneous  5,000 Units Subcutaneous Q8H  . insulin aspart  0-9 Units Subcutaneous Q4H  . micafungin (MYCAMINE) IV  100 mg Intravenous QHS  . pantoprazole (PROTONIX) IV  40 mg Intravenous QHS  . piperacillin-tazobactam (ZOSYN)  IV  3.375 g Intravenous Q8H  . vancomycin  1,000 mg Intravenous Q12H   Infusions:  . Marland KitchenTPN (CLINIMIX-E) Adult 40 mL/hr at 02/12/13 1715  . dextrose 5 % and 0.9% NaCl 35 mL/hr  at 02/11/13 1800    Nutritional Goals:   RD recs (5/30): 1980-2310 Kcal, 110-128 g Protein, 2.5 L fluid  Clinimix 5/20 at 83 ml/hr + Lipids MWF to provide 100 g protein/day and an average of  1959 Kcal/day.   Current nutrition:   Diet: NPO  IVF: D5 NS at 35 ml/hr  Clinimix E 5/20 at 40 ml/hr  CBGs & Insulin requirements past 24 hours:   CBGs ordered q4h:  Range 128-163  Sensitive SSI :  5 units  Assessment:  79 yo M admit 5/27 s/p laparoscopic hernia repair and Nissen fundoplication on 5/27.  The pt had aspiration during UGI swallow evaluation on 5/29.  He has a history of poor PO intake prior to admission and has severe malnutrition.  IV team unable to place PICC; CCM to place central line on 5/30.  6/1:  Day #3 TPN.  Unable to advance TPN towards goal rate yesterday d/t severe hypokalemia and hypophosphatemia.  Renal function:  SCr is stable/WNL  Hepatic function: ALT/AST WNL (5/31), Tbili elevated at 1.9 (5/31)  Electrolytes: K remains low (despite large replacement doses 5/31: total 100 mEq K, and 27 mmol Phos).  Phos improved WNL, magnesium, Corrected Ca and other elytes WNL.    Pre-Albumin: 8.9 (5/31)  TG/Cholesterol: WNL (5/31)  Glucose:  Increased after TPN started, but remains near goal with minimal SSI use.  Plan:  At 1800 tonight Continue Clinimix E 5/20 at 40 ml/hr - do not advance rate until hypokalemia is improved. KCl 10 mEq IV x 6 runs.  Repeat BMET tonight. Fat emulsion at 10 ml/hr (MWF only due to ongoing shortage). TNA to contain standard multivitamins and trace elements (MWF only due to ongoing shortage). Continue IVF at 35 ml/hr. Continue sensitive SSI q4h (CBGs q4h) TNA lab panels on Mondays & Thursdays. F/u daily.   Lynann Beaver PharmD, BCPS Pager [phone] 02/13/2013 7:05 AM

## 2013-02-14 ENCOUNTER — Inpatient Hospital Stay (HOSPITAL_COMMUNITY): Payer: Medicare Other

## 2013-02-14 LAB — GLUCOSE, CAPILLARY
Glucose-Capillary: 118 mg/dL — ABNORMAL HIGH (ref 70–99)
Glucose-Capillary: 121 mg/dL — ABNORMAL HIGH (ref 70–99)
Glucose-Capillary: 129 mg/dL — ABNORMAL HIGH (ref 70–99)
Glucose-Capillary: 134 mg/dL — ABNORMAL HIGH (ref 70–99)
Glucose-Capillary: 135 mg/dL — ABNORMAL HIGH (ref 70–99)
Glucose-Capillary: 137 mg/dL — ABNORMAL HIGH (ref 70–99)

## 2013-02-14 LAB — CBC
HCT: 39.8 % (ref 39.0–52.0)
Hemoglobin: 12.8 g/dL — ABNORMAL LOW (ref 13.0–17.0)
MCH: 27.6 pg (ref 26.0–34.0)
MCHC: 32.2 g/dL (ref 30.0–36.0)
MCV: 86 fL (ref 78.0–100.0)
Platelets: 254 10*3/uL (ref 150–400)
RBC: 4.63 MIL/uL (ref 4.22–5.81)
RDW: 17.4 % — ABNORMAL HIGH (ref 11.5–15.5)
WBC: 11.3 10*3/uL — ABNORMAL HIGH (ref 4.0–10.5)

## 2013-02-14 LAB — PREALBUMIN: Prealbumin: 9.4 mg/dL — ABNORMAL LOW (ref 17.0–34.0)

## 2013-02-14 LAB — COMPREHENSIVE METABOLIC PANEL
ALT: 34 U/L (ref 0–53)
AST: 53 U/L — ABNORMAL HIGH (ref 0–37)
Albumin: 1.9 g/dL — ABNORMAL LOW (ref 3.5–5.2)
Alkaline Phosphatase: 56 U/L (ref 39–117)
BUN: 15 mg/dL (ref 6–23)
CO2: 30 mEq/L (ref 19–32)
Calcium: 8.7 mg/dL (ref 8.4–10.5)
Chloride: 109 mEq/L (ref 96–112)
Creatinine, Ser: 0.92 mg/dL (ref 0.50–1.35)
GFR calc Af Amer: 89 mL/min — ABNORMAL LOW (ref >90)
GFR calc non Af Amer: 76 mL/min — ABNORMAL LOW (ref >90)
Glucose, Bld: 130 mg/dL — ABNORMAL HIGH (ref 70–99)
Potassium: 3.3 mEq/L — ABNORMAL LOW (ref 3.5–5.1)
Sodium: 148 mEq/L — ABNORMAL HIGH (ref 135–145)
Total Bilirubin: 1.3 mg/dL — ABNORMAL HIGH (ref 0.3–1.2)
Total Protein: 6.3 g/dL (ref 6.0–8.3)

## 2013-02-14 LAB — CHOLESTEROL, TOTAL: Cholesterol: 118 mg/dL (ref 0–200)

## 2013-02-14 LAB — MAGNESIUM: Magnesium: 2.1 mg/dL (ref 1.5–2.5)

## 2013-02-14 LAB — DIFFERENTIAL
Basophils Absolute: 0 10*3/uL (ref 0.0–0.1)
Basophils Relative: 0 % (ref 0–1)
Eosinophils Absolute: 0 10*3/uL (ref 0.0–0.7)
Eosinophils Relative: 0 % (ref 0–5)
Lymphocytes Relative: 9 % — ABNORMAL LOW (ref 12–46)
Lymphs Abs: 1 10*3/uL (ref 0.7–4.0)
Monocytes Absolute: 0.9 10*3/uL (ref 0.1–1.0)
Monocytes Relative: 8 % (ref 3–12)
Neutro Abs: 9.3 10*3/uL — ABNORMAL HIGH (ref 1.7–7.7)
Neutrophils Relative %: 82 % — ABNORMAL HIGH (ref 43–77)

## 2013-02-14 LAB — PHOSPHORUS: Phosphorus: 4.1 mg/dL (ref 2.3–4.6)

## 2013-02-14 LAB — TRIGLYCERIDES: Triglycerides: 106 mg/dL (ref <150)

## 2013-02-14 LAB — TROPONIN I: Troponin I: <0.3 ng/mL (ref <0.30)

## 2013-02-14 LAB — VANCOMYCIN, TROUGH: Vancomycin Tr: 14.1 ug/mL (ref 10.0–20.0)

## 2013-02-14 LAB — PRO B NATRIURETIC PEPTIDE: Pro B Natriuretic peptide (BNP): 401.1 pg/mL (ref 0–450)

## 2013-02-14 MED ORDER — ALBUTEROL SULFATE (5 MG/ML) 0.5% IN NEBU
2.5000 mg | INHALATION_SOLUTION | Freq: Four times a day (QID) | RESPIRATORY_TRACT | Status: DC
  Administered 2013-02-14 – 2013-02-20 (×21): 2.5 mg via RESPIRATORY_TRACT
  Filled 2013-02-14 (×22): qty 0.5

## 2013-02-14 MED ORDER — FUROSEMIDE 10 MG/ML IJ SOLN
40.0000 mg | Freq: Two times a day (BID) | INTRAMUSCULAR | Status: DC
  Administered 2013-02-14 – 2013-02-18 (×8): 40 mg via INTRAVENOUS
  Filled 2013-02-14 (×10): qty 4

## 2013-02-14 MED ORDER — DEXTROSE-NACL 5-0.9 % IV SOLN: INTRAVENOUS | Status: DC

## 2013-02-14 MED ORDER — FAT EMULSION 20 % IV EMUL
240.0000 mL | INTRAVENOUS | Status: AC
  Administered 2013-02-14: 240 mL via INTRAVENOUS
  Filled 2013-02-14: qty 250

## 2013-02-14 MED ORDER — TRACE MINERALS CR-CU-F-FE-I-MN-MO-SE-ZN IV SOLN
INTRAVENOUS | Status: AC
  Administered 2013-02-14: 18:00:00 via INTRAVENOUS
  Filled 2013-02-14: qty 2000

## 2013-02-14 MED ORDER — SODIUM CHLORIDE 0.9 % IV SOLN
INTRAVENOUS | Status: DC
  Administered 2013-02-14 – 2013-02-21 (×3): via INTRAVENOUS

## 2013-02-14 MED ORDER — POTASSIUM CHLORIDE 10 MEQ/100ML IV SOLN
10.0000 meq | INTRAVENOUS | Status: AC
  Administered 2013-02-14 (×4): 10 meq via INTRAVENOUS
  Filled 2013-02-14: qty 400

## 2013-02-14 MED ORDER — DEXTROSE 5 % IV SOLN
2.0000 g | INTRAVENOUS | Status: AC
  Administered 2013-02-14 – 2013-02-18 (×5): 2 g via INTRAVENOUS
  Filled 2013-02-14 (×5): qty 2

## 2013-02-14 NOTE — Progress Notes (Signed)
Patient c/o trouble breathing, being hot, requesting more O2 and the fan to be turned on.  HR 120s, O2 sats 84% on 3L [place].  100%NRB placed, RT notified to place patient on PRN Bipap.  ELink MD notified.  Will continue to monitor.

## 2013-02-14 NOTE — Progress Notes (Signed)
[id number] Earlene Plater, RN, BSN, CCM:  CHART REVIEWED AND UPDATED.  Next chart review due on [id number]. NO DISCHARGE NEEDS PRESENT AT THIS TIME. CASE MANAGEMENT [phone]

## 2013-02-14 NOTE — Progress Notes (Signed)
PARENTERAL NUTRITION CONSULT NOTE - FOLLOW UP  Pharmacy Consult for TPN Indication: Intolerance to enteral feeding  No Known Allergies  Patient Measurements: Height: 5\' 10"  (177.8 cm) Weight: 193 lb 12.6 oz (87.9 kg) IBW/kg (Calculated) : 73  Vital Signs: Temp: 97.5 F (36.4 C) (06/02 0400) Temp src: Axillary (06/02 0400) BP: 169/85 mmHg (06/02 0600) Pulse Rate: 76 (06/02 0600) Intake/Output from previous day: 06/01 0701 - 06/02 0700 In: 2675 [I.V.:805; IV Piggyback:950; TPN:920] Out: 6175 [Urine:6175]  Labs:  Recent Labs  02/12/13 0430 02/14/13 0530  WBC 10.6* 11.3*  HGB 12.2* 12.8*  HCT 38.2* 39.8  PLT 232 254     Recent Labs  02/12/13 0430 02/12/13 1815 02/13/13 0630 02/13/13 2126 02/14/13 0530  NA 140 139 140 144 148*  K 2.4* 2.8* 2.8* 3.5 3.3*  CL 101 103 104 107 109  CO2 32 29 28 29 30   GLUCOSE 131* 120* 127* 125* 130*  BUN 13 11 13 14 15   CREATININE 0.77 0.69 0.83 0.96 0.92  CALCIUM 8.5 8.1* 8.3* 8.7 8.7  MG 2.0 1.7 1.8  --  2.1  PHOS 1.1* 2.3 2.9  --  4.1  PROT 5.9*  --   --   --  6.3  ALBUMIN 2.1*  --   --   --  1.9*  AST 25  --   --   --  53*  ALT 19  --   --   --  34  ALKPHOS 47  --   --   --  56  BILITOT 1.9*  --   --   --  1.3*  PREALBUMIN 8.9*  --   --   --   --   TRIG 88  --   --   --   --   CHOL 101  --   --   --   --    Estimated Creatinine Clearance: 69.2 ml/min (by C-G formula based on Cr of 0.92).    Recent Labs  02/13/13 2108 02/14/13 02/14/13 0428  GLUCAP 129* 135* 137*    Medications:  Scheduled:  . albuterol  2.5 mg Nebulization QID  . antiseptic oral rinse  15 mL Mouth Rinse QID  . chlorhexidine  15 mL Mouth Rinse BID  . heparin subcutaneous  5,000 Units Subcutaneous Q8H  . insulin aspart  0-9 Units Subcutaneous Q4H  . pantoprazole (PROTONIX) IV  40 mg Intravenous QHS  . piperacillin-tazobactam (ZOSYN)  IV  3.375 g Intravenous Q8H  . vancomycin  1,000 mg Intravenous Q12H   Infusions:  . Marland KitchenTPN (CLINIMIX-E)  Adult 40 mL/hr at 02/13/13 1755  . dextrose 5 % and 0.9% NaCl 35 mL/hr at 02/11/13 1800    Nutritional Goals:   RD recs (5/30): 1980-2310 Kcal, 110-128 g Protein, 2.5 L fluid  Clinimix 5/20 at 83 ml/hr + Lipids MWF to provide 100 g protein/day and an average of  1959 Kcal/day.   Current nutrition:   Diet: NPO  IVF: D5 NS at 35 ml/hr  Clinimix E 5/20 at 40 ml/hr  CBGs & Insulin requirements past 24 hours:   CBGs ordered q4h:  Within goal of < 150 mg/dl  Sensitive SSI :  7 ZOXWR/60A  Assessment:  79 yo M admit 5/27 s/p laparoscopic hernia repair and Nissen fundoplication on 5/27.  The pt had aspiration during UGI swallow evaluation on 5/29.  He has a history of poor PO intake prior to admission and has severe malnutrition.  IV team unable to place  PICC; CCM to place central line on 5/30.  6/2:  Day #4 TPN.  Extubated 5/1. Unable to advance TPN towards goal rate yesterday d/t severe hypokalemia and hypophosphatemia.   Renal function:  SCr is stable/WNL, excellent UOP  Hepatic function: ALT WNL. AST sl elevated, Tbili elevated at 1.3 (improved)  Electrolytes: K remains low but improved from 5/1 am, Na = 148, Phos improved WNL, magnesium WNL, Corrected Ca  At ULN     Pre-Albumin: 8.9 (5/31), pending (6/2)  TG/Cholesterol: WNL (5/31), 106 (6/2)  Glucose:  Within goal range (< 150 mg/dl)  Plan:  At 1800 tonight Advance Clinimix E 5/20 to 60 ml/hr tonight (electrolyte imbalances improved) KCl 10 mEq IV x 4 runs Fat emulsion at 10 ml/hr (MWF only due to ongoing shortage). TNA to contain standard multivitamins and trace elements (MWF only due to ongoing shortage). Reduce MIVF to [facility] tonight for TPN rate advancement Continue sensitive SSI q4h (CBGs q4h) TNA lab panels on Mondays & Thursdays. BMP, Mg, Phos in am    Juliette Alcide, PharmD, BCPS.   Pager: [phone] 02/14/2013 7:12 AM

## 2013-02-14 NOTE — Progress Notes (Signed)
PT Cancellation Note  Patient Details Name: Victor Sutton MRN: [id number] DOB: 11/03/1927   Cancelled Treatment:    Reason Eval/Treat Not Completed: Fatigue/lethargy limiting ability to participate Pt did not feel able to participate in therapy today however would like to try mobilization tomorrow.   Victor Sutton,[name] 02/14/2013, 1:29 PM Zenovia Jarred, PT, DPT 02/14/2013 Pager: [phone]

## 2013-02-14 NOTE — Progress Notes (Signed)
eLink Physician-Brief Progress Note Patient Name: Victor Sutton DOB: 07-13-1928 MRN: [id number]  Date of Service  02/14/2013   HPI/Events of Note   respiratory distress of her blood pressure shot up to systolic of 200 and mean arterial pressure 120. BNP from 02/11/2013 was 800.  Suspect acute diastolic dysfunction   eICU Interventions   check BNP, troponin  Stat Lasix  Start BiPAP  Blood pressure control with hydralazine and Lopressor    Intervention Category Major Interventions: Respiratory failure - evaluation and management  Blessin Kanno 02/14/2013, 6:29 PM

## 2013-02-14 NOTE — Progress Notes (Signed)
Pt wants a break from BIPAP, Pt placed on 50% VM.  Pt tolerating well at this time, RT to monitor and assess as needed.

## 2013-02-14 NOTE — Clinical Documentation Improvement (Signed)
CHF DOCUMENTATION CLARIFICATION QUERY  THIS DOCUMENT IS NOT A PERMANENT PART OF THE MEDICAL RECORD  TO RESPOND TO THE THIS QUERY, FOLLOW THE INSTRUCTIONS BELOW:  1. If needed, update documentation for the patient's encounter via the notes activity.  2. Access this query again and click edit on the In [employer].  3. After updating, or not, click F2 to complete all highlighted (required) fields concerning your review. Select "additional documentation in the medical record" OR "no additional documentation provided".  4. Click Sign note button.  5. The deficiency will fall out of your In Basket *Please let us know if you are not able to complete this workflow by phone or e-mail (listed below).  Please update your documentation within the medical record to reflect your response to this query.                                                                                    02/14/13  Dear Dr. Abbey Chatters, [facility],  In a better effort to capture your patient's severity of illness, reflect appropriate length of stay and utilization of resources, a review of the patient medical record has revealed the following indicators the diagnosis of Heart Failure.    Based on your clinical judgment, please clarify and document in a progress note and/or discharge summary the clinical condition associated with the following supporting information:  In responding to this query please exercise your independent judgment.  The fact that a query is asked, does not imply that any particular answer is desired or expected.   Pt admitted for hernia  Chest Xray was ordered to evaluate for CHF. Clinical finding in medical record note the following:  1. Cardiomegaly per Chest Xray: 02/08/13 2. Left ventricular hypertrophy, JVD, EF of 30-40% with diffuse hypokinesis. With F/U Echo at 66% per CN 5/217/14 3. Tachycardia and AFlutter per CN 5/217/14 4. H/O HTN  Treatments   furosemide (LASIX) injection 40 mg        albuterol (PROVENTIL) (5 MG/ML) 0.5% nebulizer solution 2.5 mg  hydrALAZINE (APRESOLINE) injection 10-40 mg     metoprolol (LOPRESSOR) injection 2.5-5 mg     Clarification Needed    Please clarify if the acuity and Type of CHF and document in pn or d/c summary    Possible Clinical Conditions?   Chronic Systolic Congestive Heart Failure Chronic Diastolic Congestive Heart Failure Chronic Systolic & Diastolic Congestive Heart Failure Acute Systolic Congestive Heart Failure Acute Diastolic Congestive Heart Failure Acute Systolic & Diastolic Congestive Heart Failure Acute on Chronic Systolic Congestive Heart Failure Acute on Chronic Diastolic Congestive Heart Failure Acute on Chronic Systolic & Diastolic  Congestive Heart Failure Other Condition________________________________________ Cannot Clinically Determine  Supporting Information:  Risk Factors: Hernia AFlutter ASP pneumonia Severe Malnutrition Acute Respiratory Failure Hypopotassemia Bell's palsy Iron deficiency anemia HLD   Signs & Symptoms:   Diagnostics: Chest Xray:02/11/13 Underlying vascular congestion and mild cardiomegaly.   2D Echo w/ contrast on 02/09/13 EF: 55-60%  Treatment: furosemide (LASIX) injection 40 mg      albuterol (PROVENTIL) (5 MG/ML) 0.5% nebulizer solution 2.5 mg hydrALAZINE (APRESOLINE) injection 10-40 mg    metoprolol (LOPRESSOR) injection 2.5-5 mg  Reviewed:  no additional documentation provided ljh  Thank You,  Enis Slipper  RN, BSN, MSN/Inf, CCDS Clinical Documentation Specialist [facility] Pager: [phone] / E-mail: [email or web address]  Health Information Management [place]

## 2013-02-14 NOTE — Progress Notes (Signed)
PULMONARY  / CRITICAL CARE MEDICINE  Name: Victor Sutton MRN: [id number] DOB: [birth year]    ADMISSION DATE:  02/08/2013 CONSULTATION DATE:  02/08/13  REFERRING MD :  Okey Dupre PRIMARY SERVICE: Surgery  CHIEF COMPLAINT:  S/P hiatal hernia repair  BRIEF PATIENT DESCRIPTION: 79 year old male s/p open repair of hiatal hernia 02/08/13 admitted following respiratory failure and A-fib with RVR in PACU.  LINES / TUBES: 5/27 ETT>>> 5/28 5/30 ETT >> 6/01 R IJ CVL 5/30 >>   CULTURES: BAL 5/30 >> 30 k serratia Blood 5/30 >>  ANTIBIOTICS: Micafungin 5/30 >> 6/01 Vanc 5/30 >> 6/02 Zosyn 5/30 >> 6/02 Rocephin 6/02 >>    SIGNIFICANT EVENTS / STUDIES:  5/27 S/P open repair of hiatal hernia 02/10/2013: Gastrograffin swallow study > aspirated 5/30 Intubated for respiratory distress, bronchoscopy   SUBJECTIVE: Denies chest pain.  Abdominal pain better.  Still has chest congestion.  Feels BiPAP helps his breathing.  VITAL SIGNS: Temp:  [97.2 F (36.2 C)-99.6 F (37.6 C)] 97.2 F (36.2 C) (06/02 0800) Pulse Rate:  [76-118] 96 (06/02 0900) Resp:  [18-37] 26 (06/02 0900) BP: (116-206)/(61-115) 148/86 mmHg (06/02 0900) SpO2:  [87 %-97 %] 95 % (06/02 0900) FiO2 (%):  [40 %-50 %] 40 % (06/02 0808)  INTAKE / OUTPUT: Intake/Output     06/01 0701 - 06/02 0700 06/02 0701 - 06/03 0700   I.V. (mL/kg) 840 (9.6) 35 (0.4)   IV Piggyback 950 100   TPN 960 40   Total Intake(mL/kg) 2750 (31.3) 175 (2)   Urine (mL/kg/hr) 6175 (2.9) 200 (0.6)   Total Output 6175 200   Net -3425 -25          PHYSICAL EXAMINATION: General: NAD Neuro: no focal deficits HEENT: WNL Cardiovascular:  RRR s M Lungs: scattered rhonchi, on venti mask Abdomen:  Soft, NT, NABS Musculoskeletal:  MAE well and equal. No edema   LABS:  BMET    Component Value Date/Time   NA 148* 02/14/2013 0530   K 3.3* 02/14/2013 0530   CL 109 02/14/2013 0530   CO2 30 02/14/2013 0530   GLUCOSE 130* 02/14/2013 0530   BUN 15 02/14/2013 0530    CREATININE 0.92 02/14/2013 0530   CALCIUM 8.7 02/14/2013 0530   GFRNONAA 76* 02/14/2013 0530   GFRAA 89* 02/14/2013 0530    CBC    Component Value Date/Time   WBC 11.3* 02/14/2013 0530   RBC 4.63 02/14/2013 0530   HGB 12.8* 02/14/2013 0530   HCT 39.8 02/14/2013 0530   PLT 254 02/14/2013 0530   MCV 86.0 02/14/2013 0530   MCH 27.6 02/14/2013 0530   MCHC 32.2 02/14/2013 0530   RDW 17.4* 02/14/2013 0530   LYMPHSABS 1.0 02/14/2013 0530   MONOABS 0.9 02/14/2013 0530   EOSABS 0.0 02/14/2013 0530   BASOSABS 0.0 02/14/2013 0530    CXR: Dg Chest Port 1 View  02/14/2013   *RADIOLOGY REPORT*  Clinical Data: respiratory failure  PORTABLE CHEST - 1 VIEW  Comparison: Chest radiograph 02/13/2013  Findings: Right central venous line unchanged.  Stable heart silhouette.  There are low lung volumes.  Small bilateral pleural effusions are present.  Slight increase in basilar atelectasis.  No pneumothorax.  IMPRESSION: Low lung volumes with mild increase in atelectasis.  Small effusions.   Original Report Authenticated By: Genevive Bi, M.D.   Dg Chest Port 1 View  02/13/2013   *RADIOLOGY REPORT*  Clinical Data: Aspiration after surgery, respiratory failure  PORTABLE CHEST - 1 VIEW  Comparison:  02/11/2013  Findings: Interval extubation.  Low lung volumes.  Patchy bilateral lower lobe opacities, possibly reflecting atelectasis or aspiration, improved.  Suspected small left pleural effusion.  No pneumothorax.  Cardiomegaly.  Stable right IJ venous catheter.  IMPRESSION: Low lung volumes.  Patchy bilateral lower lobe opacities, possibly reflecting atelectasis or aspiration, improved.  Suspected small left pleural effusion.   Original Report Authenticated By: Charline Bills, M.D.   CBG (last 3)   Recent Labs  02/14/13 0428 02/14/13 0800 02/14/13 1126  GLUCAP 137* 118* 134*      ASSESSMENT / PLAN:  PULMONARY A: Acute post op respiratory failure s/p repair of hiatal hernia. Contrast Aspiration 5/30  Suspected diaphragmatic  dysfunction P:   -Close monitoring in ICU -BiPAP qhs and prn during day -Consider evaluation of diaphragm function when more stabilized -f/u CXR 6/03  CARDIOVASCULAR A: A-flutter with RVR > NSR  P:  -Monitor on tele  RENAL A: Hypokalemia. P:   -f/u bmet and replace electrolytes as needed -follow I&O trend  GASTROINTESTINAL A:  S/P repair of hiatal hernia and Nissen Fundoplication 02/10/13. Dysphagia. P:   -Continue TPN per CCS -Will need to address dysphagia in near future  HEMATOLOGIC A:  No active issues P:  -Monitor -SQ heparin for DVT prevention  INFECTIOUS A:  Aspiration PNA/pneumonitis >> Serratia in sputum. P:   -d/c vancomycin, zosyn 6/02 and change to rocephin  ENDOCRINE A: Hyperglycemia on TPN  P:   -Continue SSI  NEUROLOGIC A: Recent hx of Bell's palsy. P:   -monitor neurologic exam  Steve Minor ACNP [facility] PCC[facility] Pager [phone] till 3 pm If no answer page [phone] 02/14/2013, 10:39 AM  Reviewed above, examined pt, and agree with assessment/plan.  He is still very weak, but improving.  Will narrow abx for Serratia in BAL.  Continue BiPAP qhs and prn during day.  Optimizing nutritional status.  May need further evaluation of diaphragmatic function when more stable.  Updated family at bedside.  Coralyn Helling, MD [facility] Pulmonary/Critical Care 02/14/2013, 11:43 AM Pager:  [phone] After 3pm call: [phone]

## 2013-02-14 NOTE — Progress Notes (Signed)
6 Days Post-Op  Subjective: Remains extubated.  No abdominal pain. No chest pain.  No flatus.  Objective: Vital signs in last 24 hours: Temp:  [97.2 F (36.2 C)-99.6 F (37.6 C)] 97.2 F (36.2 C) (06/02 0800) Pulse Rate:  [76-118] 82 (06/02 0800) Resp:  [18-37] 26 (06/02 0800) BP: (116-206)/(61-115) 162/78 mmHg (06/02 0800) SpO2:  [87 %-97 %] 96 % (06/02 0808) FiO2 (%):  [40 %-50 %] 40 % (06/02 0808) Last BM Date: 02/03/13  Intake/Output from previous day: 06/01 0701 - 06/02 0700 In: 2750 [I.V.:840; IV Piggyback:950; TPN:960] Out: 6175 [Urine:6175] Intake/Output this shift: Total I/O In: 175 [I.V.:35; IV Piggyback:100; TPN:40] Out: -   PE: General- In NAD Abdomen-soft, incisions clean and intact, hypoactive bowel sounds.  Lab Results:   Recent Labs  02/12/13 0430 02/14/13 0530  WBC 10.6* 11.3*  HGB 12.2* 12.8*  HCT 38.2* 39.8  PLT 232 254   BMET  Recent Labs  02/13/13 2126 02/14/13 0530  NA 144 148*  K 3.5 3.3*  CL 107 109  CO2 29 30  GLUCOSE 125* 130*  BUN 14 15  CREATININE 0.96 0.92  CALCIUM 8.7 8.7   PT/INR No results found for this basename: LABPROT, INR,  in the last 72 hours Comprehensive Metabolic Panel:    Component Value Date/Time   NA 148* 02/14/2013 0530   K 3.3* 02/14/2013 0530   CL 109 02/14/2013 0530   CO2 30 02/14/2013 0530   BUN 15 02/14/2013 0530   CREATININE 0.92 02/14/2013 0530   GLUCOSE 130* 02/14/2013 0530   CALCIUM 8.7 02/14/2013 0530   AST 53* 02/14/2013 0530   ALT 34 02/14/2013 0530   ALKPHOS 56 02/14/2013 0530   BILITOT 1.3* 02/14/2013 0530   PROT 6.3 02/14/2013 0530   ALBUMIN 1.9* 02/14/2013 0530     Studies/Results: Dg Chest Port 1 View  02/14/2013   *RADIOLOGY REPORT*  Clinical Data: respiratory failure  PORTABLE CHEST - 1 VIEW  Comparison: Chest radiograph 02/13/2013  Findings: Right central venous line unchanged.  Stable heart silhouette.  There are low lung volumes.  Small bilateral pleural effusions are present.  Slight increase in  basilar atelectasis.  No pneumothorax.  IMPRESSION: Low lung volumes with mild increase in atelectasis.  Small effusions.   Original Report Authenticated By: Genevive Bi, M.D.   Dg Chest Port 1 View  02/13/2013   *RADIOLOGY REPORT*  Clinical Data: Aspiration after surgery, respiratory failure  PORTABLE CHEST - 1 VIEW  Comparison: 02/11/2013  Findings: Interval extubation.  Low lung volumes.  Patchy bilateral lower lobe opacities, possibly reflecting atelectasis or aspiration, improved.  Suspected small left pleural effusion.  No pneumothorax.  Cardiomegaly.  Stable right IJ venous catheter.  IMPRESSION: Low lung volumes.  Patchy bilateral lower lobe opacities, possibly reflecting atelectasis or aspiration, improved.  Suspected small left pleural effusion.   Original Report Authenticated By: Charline Bills, M.D.    Anti-infectives: Anti-infectives   Start     Dose/Rate Route Frequency Ordered Stop   02/12/13 1000  vancomycin (VANCOCIN) IVPB 1000 mg/200 mL premix     1,000 mg 200 mL/hr over 60 Minutes Intravenous Every 12 hours 02/12/13 0943     02/11/13 2300  piperacillin-tazobactam (ZOSYN) IVPB 3.375 g     3.375 g 12.5 mL/hr over 240 Minutes Intravenous Every 8 hours 02/11/13 2156     02/11/13 2300  vancomycin (VANCOCIN) IVPB 750 mg/150 ml premix  Status:  Discontinued     750 mg 150 mL/hr over 60 Minutes  Intravenous Every 12 hours 02/11/13 2156 02/12/13 0943   02/11/13 2230  micafungin (MYCAMINE) 100 mg in sodium chloride 0.9 % 100 mL IVPB  Status:  Discontinued     100 mg 100 mL/hr over 1 Hours Intravenous Daily at bedtime 02/11/13 2137 02/13/13 1023   02/08/13 1700  ceFAZolin (ANCEF) IVPB 1 g/50 mL premix     1 g 100 mL/hr over 30 Minutes Intravenous Every 6 hours 02/08/13 1639 02/09/13 0539   02/08/13 0650  ceFAZolin (ANCEF) IVPB 2 g/50 mL premix     2 g 100 mL/hr over 30 Minutes Intravenous On call to O.R. 02/08/13 0650 02/08/13 0957      Assessment Principal Problem:    Hiatal hernia-recurrent s/p laparoscopic repair and Nissen fundoplication on 02/08/13 Active Problems:   Aspiration pneumonia-on abxs   Bell's palsy   GERD (gastroesophageal reflux disease)   Atrial fibrillation-resolved.   Acute respiratory failure-on Venturi mask after extubation   Protein-calorie malnutrition, severe-on TPN.   Aspiration into airway    LOS: 6 days   Plan: Eventual swallowing evaluation when he is more stable from a respiratory standpoint.  Continue TPN for nutrition.   Thresia Ramanathan J 02/14/2013

## 2013-02-14 NOTE — Progress Notes (Signed)
SUBJECTIVE:  He has no acute complaints this am although his breathing is still "touchy".   PHYSICAL EXAM Filed Vitals:   02/14/13 0300 02/14/13 0400 02/14/13 0500 02/14/13 0600  BP: 150/82 155/82 150/94 169/85  Pulse: 76 87 77 76  Temp:  97.5 F (36.4 C)    TempSrc:  Axillary    Resp: 22 25 23 26   Height:      Weight:      SpO2: 96% 95% 96% 96%   General:  No acute distress Lungs:  Decreased breath sounds Heart:  RRR Abdomen:  Positive bowel sounds, no rebound no guarding Extremities:  No edema  LABS:  Results for orders placed during the hospital encounter of 02/08/13 (from the past 24 hour(s))  GLUCOSE, CAPILLARY     Status: Abnormal   Collection Time    02/13/13  7:44 AM      Result Value Range   Glucose-Capillary 130 (*) 70 - 99 mg/dL   Comment 1 Documented in Chart     Comment 2 Notify RN    GLUCOSE, CAPILLARY     Status: Abnormal   Collection Time    02/13/13 12:01 PM      Result Value Range   Glucose-Capillary 156 (*) 70 - 99 mg/dL   Comment 1 Documented in Chart     Comment 2 Notify RN    GLUCOSE, CAPILLARY     Status: Abnormal   Collection Time    02/13/13  4:12 PM      Result Value Range   Glucose-Capillary 138 (*) 70 - 99 mg/dL  GLUCOSE, CAPILLARY     Status: Abnormal   Collection Time    02/13/13  9:08 PM      Result Value Range   Glucose-Capillary 129 (*) 70 - 99 mg/dL   Comment 1 Notify RN    BASIC METABOLIC PANEL     Status: Abnormal   Collection Time    02/13/13  9:26 PM      Result Value Range   Sodium 144  135 - 145 mEq/L   Potassium 3.5  3.5 - 5.1 mEq/L   Chloride 107  96 - 112 mEq/L   CO2 29  19 - 32 mEq/L   Glucose, Bld 125 (*) 70 - 99 mg/dL   BUN 14  6 - 23 mg/dL   Creatinine, Ser 4.78  0.50 - 1.35 mg/dL   Calcium 8.7  8.4 - 29.5 mg/dL   GFR calc non Af Amer 75 (*) >90 mL/min   GFR calc Af Amer 87 (*) >90 mL/min  GLUCOSE, CAPILLARY     Status: Abnormal   Collection Time    02/14/13 12:00 AM      Result Value Range   Glucose-Capillary 135 (*) 70 - 99 mg/dL   Comment 1 Documented in Chart     Comment 2 Notify RN    GLUCOSE, CAPILLARY     Status: Abnormal   Collection Time    02/14/13  4:28 AM      Result Value Range   Glucose-Capillary 137 (*) 70 - 99 mg/dL   Comment 1 Documented in Chart     Comment 2 Notify RN    COMPREHENSIVE METABOLIC PANEL     Status: Abnormal   Collection Time    02/14/13  5:30 AM      Result Value Range   Sodium 148 (*) 135 - 145 mEq/L   Potassium 3.3 (*) 3.5 - 5.1 mEq/L   Chloride 109  96 - 112 mEq/L   CO2 30  19 - 32 mEq/L   Glucose, Bld 130 (*) 70 - 99 mg/dL   BUN 15  6 - 23 mg/dL   Creatinine, Ser 1.91  0.50 - 1.35 mg/dL   Calcium 8.7  8.4 - 47.8 mg/dL   Total Protein 6.3  6.0 - 8.3 g/dL   Albumin 1.9 (*) 3.5 - 5.2 g/dL   AST 53 (*) 0 - 37 U/L   ALT 34  0 - 53 U/L   Alkaline Phosphatase 56  39 - 117 U/L   Total Bilirubin 1.3 (*) 0.3 - 1.2 mg/dL   GFR calc non Af Amer 76 (*) >90 mL/min   GFR calc Af Amer 89 (*) >90 mL/min  MAGNESIUM     Status: None   Collection Time    02/14/13  5:30 AM      Result Value Range   Magnesium 2.1  1.5 - 2.5 mg/dL  PHOSPHORUS     Status: None   Collection Time    02/14/13  5:30 AM      Result Value Range   Phosphorus 4.1  2.3 - 4.6 mg/dL  CBC     Status: Abnormal   Collection Time    02/14/13  5:30 AM      Result Value Range   WBC 11.3 (*) 4.0 - 10.5 K/uL   RBC 4.63  4.22 - 5.81 MIL/uL   Hemoglobin 12.8 (*) 13.0 - 17.0 g/dL   HCT 29.5  62.1 - 30.8 %   MCV 86.0  78.0 - 100.0 fL   MCH 27.6  26.0 - 34.0 pg   MCHC 32.2  30.0 - 36.0 g/dL   RDW 65.7 (*) 84.6 - 96.2 %   Platelets 254  150 - 400 K/uL  DIFFERENTIAL     Status: Abnormal   Collection Time    02/14/13  5:30 AM      Result Value Range   Neutrophils Relative % 82 (*) 43 - 77 %   Neutro Abs 9.3 (*) 1.7 - 7.7 K/uL   Lymphocytes Relative 9 (*) 12 - 46 %   Lymphs Abs 1.0  0.7 - 4.0 K/uL   Monocytes Relative 8  3 - 12 %   Monocytes Absolute 0.9  0.1 - 1.0 K/uL    Eosinophils Relative 0  0 - 5 %   Eosinophils Absolute 0.0  0.0 - 0.7 K/uL   Basophils Relative 0  0 - 1 %   Basophils Absolute 0.0  0.0 - 0.1 K/uL    Intake/Output Summary (Last 24 hours) at 02/14/13 0726 Last data filed at 02/14/13 9528  Gross per 24 hour  Intake   2675 ml  Output   6175 ml  Net  -3500 ml    ASSESSMENT AND PLAN:  Atrial fibrillation:  Maintaining NSR.  Off of amiodarone.  No indication for anticoagulation.    HTN:  Consider lopressor or Cardizem for BP given atrial/fib flutter recently.    We will sign off and see as needed.   Fayrene Fearing [facility] 02/14/2013 7:26 AM

## 2013-02-14 NOTE — Progress Notes (Signed)
ANTIBIOTIC CONSULT NOTE - follow up  Pharmacy Consult for Vancomycin/Zosyn Indication: Aspiration PNA/HCAP  No Known Allergies  Patient Measurements: Height: 5\' 10"  (177.8 cm) Weight: 193 lb 12.6 oz (87.9 kg) IBW/kg (Calculated) : 73   Vital Signs: Temp: 97.2 F (36.2 C) (06/02 0800) Temp src: Oral (06/02 0800) BP: 148/86 mmHg (06/02 0900) Pulse Rate: 96 (06/02 0900) Intake/Output from previous day: 06/01 0701 - 06/02 0700 In: 2750 [I.V.:840; IV Piggyback:950; TPN:960] Out: 6175 [Urine:6175]  Labs:  Recent Labs  02/12/13 0430  02/13/13 0630 02/13/13 2126 02/14/13 0530  WBC 10.6*  --   --   --  11.3*  HGB 12.2*  --   --   --  12.8*  PLT 232  --   --   --  254  CREATININE 0.77  < > 0.83 0.96 0.92  < > = values in this interval not displayed. Estimated Creatinine Clearance: 69.2 ml/min (by C-G formula based on Cr of 0.92).    Medications:  Anti-infectives   Start     Dose/Rate Route Frequency Ordered Stop   02/12/13 1000  vancomycin (VANCOCIN) IVPB 1000 mg/200 mL premix     1,000 mg 200 mL/hr over 60 Minutes Intravenous Every 12 hours 02/12/13 0943     02/11/13 2300  piperacillin-tazobactam (ZOSYN) IVPB 3.375 g     3.375 g 12.5 mL/hr over 240 Minutes Intravenous Every 8 hours 02/11/13 2156     02/11/13 2300  vancomycin (VANCOCIN) IVPB 750 mg/150 ml premix  Status:  Discontinued     750 mg 150 mL/hr over 60 Minutes Intravenous Every 12 hours 02/11/13 2156 02/12/13 0943   02/11/13 2230  micafungin (MYCAMINE) 100 mg in sodium chloride 0.9 % 100 mL IVPB  Status:  Discontinued     100 mg 100 mL/hr over 1 Hours Intravenous Daily at bedtime 02/11/13 2137 02/13/13 1023   02/08/13 1700  ceFAZolin (ANCEF) IVPB 1 g/50 mL premix     1 g 100 mL/hr over 30 Minutes Intravenous Every 6 hours 02/08/13 1639 02/09/13 0539   02/08/13 0650  ceFAZolin (ANCEF) IVPB 2 g/50 mL premix     2 g 100 mL/hr over 30 Minutes Intravenous On call to O.R. 02/08/13 1610 02/08/13 0957       Assessment: 79 yo M with recurrent episodes of aspiration (dysphagia possibly related to Bell's palsy) that required intubation and bronch on 5/30.  TPN was also started on 5/30 for nutritional support.  He developed a fever 5/30 pm and pharmacy is consulted for vancomycin and zosyn dosing.    HPI: 79 yo on TNA, found to have episodes of aspiration. MD ordering Vancomycin and Zosyn per Rx. On 5/30 required intubation and bronch. Started on broad spectrum abx for fever and micafungin per MD d/t risks w/ TPN (just started 5/30).  5/30 >>vancomycin >> 5/30 >>zosyn >>  5/30>>micafungin (Md) >> 6/1  Tmax: 100.6 --> now 98.8 WBCs: 11.3 Renal: SCr 0.92, CrCl ~ 69 CG, 63 N  5/30 BAL: 30k S. Marcescens (R to cefazolin, I to Cefoxitin, S to others) 5/30 blood: NGTD 5/30 fungal blood cxt: NGTD 5/30 urine: NG  Dose changes/drug level info:  5/31 Empiric dose inc to Vanc 1g q12 per nomogram. 6/1 0900 VT = 14.85mcg/ml on 1gm IV q12h (prior to 5th dose)  Goal of Therapy:  Vancomycin trough level 15-20 mcg/ml  Plan:   Continue Zosyn 3.375g IV Q8H infused over 4hrs. Continue Vancomycin 1g IV q12h for now, vanco trough slightly below goal  15-5mcg/ml Follow up renal fxn and culture results.   Juliette Alcide, PharmD, BCPS.   Pager: [phone] 02/14/2013 10:33 AM

## 2013-02-15 ENCOUNTER — Inpatient Hospital Stay (HOSPITAL_COMMUNITY): Payer: Medicare Other

## 2013-02-15 LAB — CBC
HCT: 39.4 % (ref 39.0–52.0)
Hemoglobin: 13.4 g/dL (ref 13.0–17.0)
MCH: 29.6 pg (ref 26.0–34.0)
MCHC: 34 g/dL (ref 30.0–36.0)
MCV: 87 fL (ref 78.0–100.0)
Platelets: 318 10*3/uL (ref 150–400)
RBC: 4.53 MIL/uL (ref 4.22–5.81)
RDW: 17.7 % — ABNORMAL HIGH (ref 11.5–15.5)
WBC: 11 10*3/uL — ABNORMAL HIGH (ref 4.0–10.5)

## 2013-02-15 LAB — BASIC METABOLIC PANEL
BUN: 21 mg/dL (ref 6–23)
CO2: 30 mEq/L (ref 19–32)
Calcium: 9.1 mg/dL (ref 8.4–10.5)
Chloride: 106 mEq/L (ref 96–112)
Creatinine, Ser: 0.79 mg/dL (ref 0.50–1.35)
GFR calc Af Amer: >90 mL/min (ref >90)
GFR calc non Af Amer: 81 mL/min — ABNORMAL LOW (ref >90)
Glucose, Bld: 130 mg/dL — ABNORMAL HIGH (ref 70–99)
Potassium: 3.5 mEq/L (ref 3.5–5.1)
Sodium: 143 mEq/L (ref 135–145)

## 2013-02-15 LAB — TROPONIN I
Troponin I: <0.3 ng/mL (ref <0.30)
Troponin I: <0.3 ng/mL (ref <0.30)

## 2013-02-15 LAB — MAGNESIUM: Magnesium: 2.3 mg/dL (ref 1.5–2.5)

## 2013-02-15 LAB — GLUCOSE, CAPILLARY
Glucose-Capillary: 136 mg/dL — ABNORMAL HIGH (ref 70–99)
Glucose-Capillary: 139 mg/dL — ABNORMAL HIGH (ref 70–99)
Glucose-Capillary: 146 mg/dL — ABNORMAL HIGH (ref 70–99)
Glucose-Capillary: 121 mg/dL — ABNORMAL HIGH (ref 70–99)
Glucose-Capillary: 118 mg/dL — ABNORMAL HIGH (ref 70–99)
Glucose-Capillary: 136 mg/dL — ABNORMAL HIGH (ref 70–99)
Glucose-Capillary: 116 mg/dL — ABNORMAL HIGH (ref 70–99)

## 2013-02-15 LAB — PHOSPHORUS: Phosphorus: 3.4 mg/dL (ref 2.3–4.6)

## 2013-02-15 MED ORDER — CLINIMIX E/DEXTROSE (5/20) 5 % IV SOLN
INTRAVENOUS | Status: AC
  Administered 2013-02-15: 17:00:00 via INTRAVENOUS
  Filled 2013-02-15: qty 2000

## 2013-02-15 MED ORDER — POTASSIUM CHLORIDE 10 MEQ/50ML IV SOLN
10.0000 meq | INTRAVENOUS | Status: AC
  Administered 2013-02-15 (×2): 10 meq via INTRAVENOUS
  Filled 2013-02-15: qty 100

## 2013-02-15 MED ORDER — CLONAZEPAM 0.25 MG PO TBDP
0.2500 mg | ORAL_TABLET | Freq: Two times a day (BID) | ORAL | Status: DC | PRN
  Administered 2013-02-15 (×2): 0.25 mg via ORAL
  Filled 2013-02-15 (×2): qty 2

## 2013-02-15 NOTE — Progress Notes (Signed)
PARENTERAL NUTRITION CONSULT NOTE - FOLLOW UP  Pharmacy Consult for TPN Indication: Intolerance to enteral feeding  No Known Allergies  Patient Measurements: Height: 5\' 10"  (177.8 cm) Weight: 193 lb 12.6 oz (87.9 kg) IBW/kg (Calculated) : 73  Vital Signs: Temp: 98.1 F (36.7 C) (06/03 0400) Temp src: Oral (06/03 0400) BP: 128/74 mmHg (06/03 0436) Pulse Rate: 84 (06/03 0436) Intake/Output from previous day: 06/02 0701 - 06/03 0700 In: 1875 [I.V.:325; IV Piggyback:450; TPN:1100] Out: 2001 [Urine:2000; Stool:1]  Labs:  Recent Labs  02/14/13 0530 02/15/13 0706  WBC 11.3* 11.0*  HGB 12.8* 13.4  HCT 39.8 39.4  PLT 254 318     Recent Labs  02/13/13 0630 02/13/13 2126 02/14/13 0530 02/15/13 0706  NA 140 144 148* 143  K 2.8* 3.5 3.3* 3.5  CL 104 107 109 106  CO2 28 29 30 30   GLUCOSE 127* 125* 130* 130*  BUN 13 14 15 21   CREATININE 0.83 0.96 0.92 0.79  CALCIUM 8.3* 8.7 8.7 9.1  MG 1.8  --  2.1 2.3  PHOS 2.9  --  4.1 3.4  PROT  --   --  6.3  --   ALBUMIN  --   --  1.9*  --   AST  --   --  53*  --   ALT  --   --  34  --   ALKPHOS  --   --  56  --   BILITOT  --   --  1.3*  --   PREALBUMIN  --   --  9.4*  --   TRIG  --   --  106  --   CHOL  --   --  118  --    Estimated Creatinine Clearance: 79.5 ml/min (by C-G formula based on Cr of 0.79).    Recent Labs  02/14/13 0800 02/14/13 1126 02/14/13 1637  GLUCAP 118* 134* 121*    Medications:  Scheduled:  . albuterol  2.5 mg Nebulization Q6H  . antiseptic oral rinse  15 mL Mouth Rinse QID  . cefTRIAXone (ROCEPHIN)  IV  2 g Intravenous Q24H  . chlorhexidine  15 mL Mouth Rinse BID  . furosemide  40 mg Intravenous Q12H  . heparin subcutaneous  5,000 Units Subcutaneous Q8H  . insulin aspart  0-9 Units Subcutaneous Q4H  . pantoprazole (PROTONIX) IV  40 mg Intravenous QHS  . potassium chloride  10 mEq Intravenous Q1 Hr x 2   Infusions:  . sodium chloride 20 mL/hr at 02/14/13 1400  . Marland KitchenTPN (CLINIMIX-E) Adult  60 mL/hr at 02/14/13 1743   And  . fat emulsion 240 mL (02/14/13 1743)    Nutritional Goals:   RD recs (5/30): 1980-2310 Kcal, 110-128 g Protein, 2.5 L fluid  Clinimix 5/20 at 83 ml/hr + Lipids MWF to provide 100 g protein/day and an average of  1959 Kcal/day.   Current nutrition:   Diet: NPO  IVF: D5 NS at [facility]  Clinimix E 5/20 at 60 ml/hr  CBGs & Insulin requirements past 24 hours:   CBGs ordered q4h:  Within goal of < 150 mg/dl  Sensitive SSI :  6 ZOXWR/60A  Assessment:  79 yo M admit 5/27 s/p laparoscopic hernia repair and Nissen fundoplication on 5/27.  The pt had aspiration during UGI swallow evaluation on 5/29.  He has a history of poor PO intake prior to admission and has severe malnutrition.  IV team unable to place PICC; CCM to place central line on  5/30.  6/3:  Day #5 TPN.  Extubated 5/1. TPN now being advanced, previously unable d/t electrolyte abnormalities   Renal function:  SCr is stable/WNL, excellent UOP  Hepatic function: ALT WNL. AST sl elevated, Tbili elevated at 1.3 (improved)  Electrolytes: K = 3.5, Na = 143 (improved), Phos/magnesium WNL, Corrected Ca =10.8 (sl elevated)     Pre-Albumin: 8.9 (5/31), 9.4 (6/2)  TG/Cholesterol: WNL (5/31), 106 (6/2)  Glucose:  Within goal range (< 150 mg/dl)  Plan:  At 1800 tonight Advance Clinimix E 5/20 to goal 83 ml/hr tonight   - CBGs and lytes OK. Monitor CBGs with rate increase  - Prealbumin essentially unchanged from baseline (sl improvement) Fat emulsion at 10 ml/hr (MWF only due to ongoing shortage). TNA to contain standard multivitamins and trace elements (MWF only due to ongoing shortage). Continue sensitive SSI q4h (CBGs q4h) TNA lab panels on Mondays & Thursdays. BMP, Mg, Phos in am  Juliette Alcide, PharmD, BCPS.   Pager: [phone] 02/15/2013 7:54 AM

## 2013-02-15 NOTE — Progress Notes (Signed)
7 Days Post-Op  Subjective: Breathing well with BIPAP.  Passing gas.  Had a BM.  Objective: Vital signs in last 24 hours: Temp:  [97 F (36.1 C)-99.3 F (37.4 C)] 98.1 F (36.7 C) (06/03 0400) Pulse Rate:  [81-115] 84 (06/03 0436) Resp:  [18-30] 20 (06/03 0436) BP: (111-194)/(65-97) 128/74 mmHg (06/03 0436) SpO2:  [94 %-98 %] 98 % (06/03 0436) FiO2 (%):  [35 %-60 %] 60 % (06/03 0736) Last BM Date: 02/03/13  Intake/Output from previous day: 06/02 0701 - 06/03 0700 In: 1875 [I.V.:325; IV Piggyback:450; TPN:1100] Out: 2001 [Urine:2000; Stool:1] Intake/Output this shift:    PE: General- In NAD.  BIPAP on. Abdomen-soft, incisions clean and intact  Lab Results:   Recent Labs  02/14/13 0530 02/15/13 0706  WBC 11.3* 11.0*  HGB 12.8* 13.4  HCT 39.8 39.4  PLT 254 318   BMET  Recent Labs  02/14/13 0530 02/15/13 0706  NA 148* 143  K 3.3* 3.5  CL 109 106  CO2 30 30  GLUCOSE 130* 130*  BUN 15 21  CREATININE 0.92 0.79  CALCIUM 8.7 9.1   PT/INR No results found for this basename: LABPROT, INR,  in the last 72 hours Comprehensive Metabolic Panel:    Component Value Date/Time   NA 143 02/15/2013 0706   K 3.5 02/15/2013 0706   CL 106 02/15/2013 0706   CO2 30 02/15/2013 0706   BUN 21 02/15/2013 0706   CREATININE 0.79 02/15/2013 0706   GLUCOSE 130* 02/15/2013 0706   CALCIUM 9.1 02/15/2013 0706   AST 53* 02/14/2013 0530   ALT 34 02/14/2013 0530   ALKPHOS 56 02/14/2013 0530   BILITOT 1.3* 02/14/2013 0530   PROT 6.3 02/14/2013 0530   ALBUMIN 1.9* 02/14/2013 0530     Studies/Results: Dg Chest Port 1 View  02/15/2013   *RADIOLOGY REPORT*  Clinical Data: Respiratory distress.  PORTABLE CHEST - 1 VIEW  Comparison: Chest x-ray 02/14/2013.  Findings: There is a right-sided internal jugular central venous catheter with tip terminating in the superior cavoatrial junction and. Lung volumes are low.  Bibasilar opacities may reflect areas of atelectasis and/or consolidation.  Possible trace bilateral  pleural effusions.  No evidence of pulmonary edema.  Heart size is within normal limits.  Atherosclerosis of the thoracic aorta. The patient is rotated to the left on today's exam, resulting in distortion of the mediastinal contours and reduced diagnostic sensitivity and specificity for mediastinal pathology.  IMPRESSION: 1.  Support apparatus, as above. 2.  Low lung volumes with bibasilar atelectasis and/or consolidation and probable trace bilateral pleural effusions. 3.  Atherosclerosis.   Original Report Authenticated By: Trudie Reed, M.D.   Dg Chest Port 1 View  02/14/2013   *RADIOLOGY REPORT*  Clinical Data: respiratory failure  PORTABLE CHEST - 1 VIEW  Comparison: Chest radiograph 02/13/2013  Findings: Right central venous line unchanged.  Stable heart silhouette.  There are low lung volumes.  Small bilateral pleural effusions are present.  Slight increase in basilar atelectasis.  No pneumothorax.  IMPRESSION: Low lung volumes with mild increase in atelectasis.  Small effusions.   Original Report Authenticated By: Genevive Bi, M.D.   Dg Chest Port 1 View  02/13/2013   *RADIOLOGY REPORT*  Clinical Data: Aspiration after surgery, respiratory failure  PORTABLE CHEST - 1 VIEW  Comparison: 02/11/2013  Findings: Interval extubation.  Low lung volumes.  Patchy bilateral lower lobe opacities, possibly reflecting atelectasis or aspiration, improved.  Suspected small left pleural effusion.  No pneumothorax.  Cardiomegaly.  Stable right IJ venous catheter.  IMPRESSION: Low lung volumes.  Patchy bilateral lower lobe opacities, possibly reflecting atelectasis or aspiration, improved.  Suspected small left pleural effusion.   Original Report Authenticated By: Charline Bills, M.D.    Anti-infectives: Anti-infectives   Start     Dose/Rate Route Frequency Ordered Stop   02/14/13 1400  cefTRIAXone (ROCEPHIN) 2 g in dextrose 5 % 50 mL IVPB     2 g 100 mL/hr over 30 Minutes Intravenous Every 24 hours  02/14/13 1143     02/12/13 1000  vancomycin (VANCOCIN) IVPB 1000 mg/200 mL premix  Status:  Discontinued     1,000 mg 200 mL/hr over 60 Minutes Intravenous Every 12 hours 02/12/13 0943 02/14/13 1137   02/11/13 2300  piperacillin-tazobactam (ZOSYN) IVPB 3.375 g  Status:  Discontinued     3.375 g 12.5 mL/hr over 240 Minutes Intravenous Every 8 hours 02/11/13 2156 02/14/13 1144   02/11/13 2300  vancomycin (VANCOCIN) IVPB 750 mg/150 ml premix  Status:  Discontinued     750 mg 150 mL/hr over 60 Minutes Intravenous Every 12 hours 02/11/13 2156 02/12/13 0943   02/11/13 2230  micafungin (MYCAMINE) 100 mg in sodium chloride 0.9 % 100 mL IVPB  Status:  Discontinued     100 mg 100 mL/hr over 1 Hours Intravenous Daily at bedtime 02/11/13 2137 02/13/13 1023   02/08/13 1700  ceFAZolin (ANCEF) IVPB 1 g/50 mL premix     1 g 100 mL/hr over 30 Minutes Intravenous Every 6 hours 02/08/13 1639 02/09/13 0539   02/08/13 0650  ceFAZolin (ANCEF) IVPB 2 g/50 mL premix     2 g 100 mL/hr over 30 Minutes Intravenous On call to O.R. 02/08/13 0650 02/08/13 0957      Assessment Principal Problem:   Hiatal hernia-recurrent s/p laparoscopic repair and Nissen fundoplication on 02/08/13 Active Problems:   Aspiration pneumonia-on abxs   Bell's palsy   Atrial fibrillation-resolved.   Acute respiratory failure-on BIPAP and breathing comfortably this AM.   Protein-calorie malnutrition, severe-on TPN.   Aspiration into airway   Severe deconditioned state   Chronic elevation of right hemidiaphragm    LOS: 7 days   Plan:  Continue TPN for nutrition.  PT.  Adolph Pollack 02/15/2013

## 2013-02-15 NOTE — Progress Notes (Signed)
PT Cancellation Note  Patient Details Name: Victor Sutton MRN: [id number] DOB: 08-16-1928   Cancelled Treatment:     Will hold PT today-discussed with RN. Will check back on tomorrow to see if pt is appropriate for activity with PT. Thanks.    Rebeca Alert, MPT Pager: [phone]

## 2013-02-15 NOTE — Progress Notes (Signed)
PULMONARY  / CRITICAL CARE MEDICINE  Name: Victor Sutton MRN: [id number] DOB: [birth year]    ADMISSION DATE:  02/08/2013 CONSULTATION DATE:  02/08/13  REFERRING MD :  Okey Dupre PRIMARY SERVICE: Surgery  CHIEF COMPLAINT:  S/P hiatal hernia repair  BRIEF PATIENT DESCRIPTION: 79 year old male s/p open repair of hiatal hernia 02/08/13 admitted following respiratory failure and A-fib with RVR in PACU.  LINES / TUBES: 5/27 ETT>>> 5/28 5/30 ETT >> 6/01 R IJ CVL 5/30 >>   CULTURES: BAL 5/30 >> 30 k serratia Blood 5/30 >>  ANTIBIOTICS: Micafungin 5/30 >> 6/01 Vanc 5/30 >> 6/02 Zosyn 5/30 >> 6/02 Rocephin 6/02 >>    SIGNIFICANT EVENTS / STUDIES:  5/27 S/P open repair of hiatal hernia 0/29 Gastrograffin swallow study > aspirated 5/30 Intubated for respiratory distress, bronchoscopy   SUBJECTIVE: Denies chest pain.  Abdominal pain better.  Still has chest congestion.  Feels BiPAP helps his breathing.  VITAL SIGNS: Temp:  [97 F (36.1 C)-99.3 F (37.4 C)] 98.1 F (36.7 C) (06/03 0400) Pulse Rate:  [81-115] 84 (06/03 0436) Resp:  [18-30] 20 (06/03 0436) BP: (111-194)/(65-97) 128/74 mmHg (06/03 0436) SpO2:  [94 %-98 %] 98 % (06/03 0436) FiO2 (%):  [35 %-60 %] 60 % (06/03 0736)  INTAKE / OUTPUT: Intake/Output     06/02 0701 - 06/03 0700 06/03 0701 - 06/04 0700   I.V. (mL/kg) 325 (3.7)    IV Piggyback 450    TPN 1100    Total Intake(mL/kg) 1875 (21.3)    Urine (mL/kg/hr) 2000 (0.9)    Stool 1 (0)    Total Output 2001     Net -126            PHYSICAL EXAMINATION: General: NAD Neuro: no focal deficits HEENT: WNL Cardiovascular:  RRR s M Lungs: scattered rhonchi, on NIPPV Abdomen:  Soft, NT, NABS Musculoskeletal:  MAE well and equal. No edema   LABS:  BMET    Component Value Date/Time   NA 143 02/15/2013 0706   K 3.5 02/15/2013 0706   CL 106 02/15/2013 0706   CO2 30 02/15/2013 0706   GLUCOSE 130* 02/15/2013 0706   BUN 21 02/15/2013 0706   CREATININE 0.79 02/15/2013 0706    CALCIUM 9.1 02/15/2013 0706   GFRNONAA 81* 02/15/2013 0706   GFRAA >90 02/15/2013 0706    CBC    Component Value Date/Time   WBC 11.0* 02/15/2013 0706   RBC 4.53 02/15/2013 0706   HGB 13.4 02/15/2013 0706   HCT 39.4 02/15/2013 0706   PLT 318 02/15/2013 0706   MCV 87.0 02/15/2013 0706   MCH 29.6 02/15/2013 0706   MCHC 34.0 02/15/2013 0706   RDW 17.7* 02/15/2013 0706   LYMPHSABS 1.0 02/14/2013 0530   MONOABS 0.9 02/14/2013 0530   EOSABS 0.0 02/14/2013 0530   BASOSABS 0.0 02/14/2013 0530    CXR: Dg Chest Port 1 View  02/15/2013   *RADIOLOGY REPORT*  Clinical Data: Respiratory distress.  PORTABLE CHEST - 1 VIEW  Comparison: Chest x-ray 02/14/2013.  Findings: There is a right-sided internal jugular central venous catheter with tip terminating in the superior cavoatrial junction and. Lung volumes are low.  Bibasilar opacities may reflect areas of atelectasis and/or consolidation.  Possible trace bilateral pleural effusions.  No evidence of pulmonary edema.  Heart size is within normal limits.  Atherosclerosis of the thoracic aorta. The patient is rotated to the left on today's exam, resulting in distortion of the mediastinal contours and reduced diagnostic sensitivity  and specificity for mediastinal pathology.  IMPRESSION: 1.  Support apparatus, as above. 2.  Low lung volumes with bibasilar atelectasis and/or consolidation and probable trace bilateral pleural effusions. 3.  Atherosclerosis.   Original Report Authenticated By: Trudie Reed, M.D.   Dg Chest Port 1 View  02/14/2013   *RADIOLOGY REPORT*  Clinical Data: respiratory failure  PORTABLE CHEST - 1 VIEW  Comparison: Chest radiograph 02/13/2013  Findings: Right central venous line unchanged.  Stable heart silhouette.  There are low lung volumes.  Small bilateral pleural effusions are present.  Slight increase in basilar atelectasis.  No pneumothorax.  IMPRESSION: Low lung volumes with mild increase in atelectasis.  Small effusions.   Original Report Authenticated By:  Genevive Bi, M.D.   Dg Chest Port 1 View  02/13/2013   *RADIOLOGY REPORT*  Clinical Data: Aspiration after surgery, respiratory failure  PORTABLE CHEST - 1 VIEW  Comparison: 02/11/2013  Findings: Interval extubation.  Low lung volumes.  Patchy bilateral lower lobe opacities, possibly reflecting atelectasis or aspiration, improved.  Suspected small left pleural effusion.  No pneumothorax.  Cardiomegaly.  Stable right IJ venous catheter.  IMPRESSION: Low lung volumes.  Patchy bilateral lower lobe opacities, possibly reflecting atelectasis or aspiration, improved.  Suspected small left pleural effusion.   Original Report Authenticated By: Charline Bills, M.D.   CBG (last 3)   Recent Labs  02/14/13 2009 02/15/13 0031 02/15/13 0331  GLUCAP 136* 139* 146*      ASSESSMENT / PLAN:  PULMONARY A: Acute post op respiratory failure s/p repair of hiatal hernia. 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 Flash pulmonary edema P:   -Close monitoring in ICU -BiPAP qhs and prn during day -Consider evaluation of diaphragm function when more stabilized -f/u CXR 6/04  CARDIOVASCULAR A: A-flutter with RVR > NSR .  Suspected flash pulmonary edema  Intake/Output Summary (Last 24 hours) at 02/15/13 0841 Last data filed at 02/15/13 0300  Gross per 24 hour  Intake   1700 ml  Output   1801 ml  Net   -101 ml    P:  -Monitor on tele -diuresis -strict i o -BP control RENAL A: Hypokalemia.  Recent Labs Lab 02/13/13 2126 02/14/13 0530 02/15/13 0706  K 3.5 3.3* 3.5     P:   -f/u bmet and replace electrolytes as needed -follow I&O trend  GASTROINTESTINAL A:  S/P repair of hiatal hernia and Nissen Fundoplication 02/10/13. Dysphagia. P:   -Continue TPN per [facility] -Will need to address dysphagia in near future  HEMATOLOGIC A:  No active issues P:  -Monitor -SQ heparin for DVT prevention  INFECTIOUS A:  Aspiration PNA/pneumonitis >> Serratia in sputum. P:    -d/c vancomycin, zosyn 6/02 and change to rocephin  ENDOCRINE A: Hyperglycemia on TPN  P:   -Continue SSI  NEUROLOGIC A: Recent hx of Bell's palsy. Anxiety  P:   -monitor neurologic exam -gentle anxiolytics     May need further evaluation of diaphragmatic function when more stable. 6-3 he developed acute resp distress consistent with flash pulmonary edema. bnp 400 , recent 2 d with ef 60% and LVH. Updated family at bedside.    Brett Canales Minor ACNP [facility] Pager [phone] till 3 pm If no answer page [phone] 02/15/2013, 8:31 AM   Reviewed above, examined pt, and agree with assessment/plan.  Continue BiPAP qhs and prn during day.  Will order SNIFF test to assess diaphragmatic function.  Discussed plan with Dr. Abbey Chatters, and updated family at bedside.  Christifer Chapdelaine  Craige Cotta, MD [facility] Pulmonary/Critical Care 02/15/2013, 1:18 PM Pager:  [phone] After 3pm call: [phone]

## 2013-02-15 NOTE — Progress Notes (Signed)
[facility] Nursing ICU Electrolyte Replacement Protocol  Patient Name: Victor Sutton DOB: 1927-09-19 MRN: [id number]  Date of Service  02/15/2013 K+ 3.3  Electrolyte protocol criteria met. Labs replaced per protocol. MD notified.  HPI/Events of Note    Recent Labs Lab 02/10/13 0340  02/12/13 0430 02/12/13 1815 02/13/13 0630 02/13/13 2126 02/14/13 0530  NA 139  < > 140 139 140 144 148*  K 3.5  < > 2.4* 2.8* 2.8* 3.5 3.3*  CL 103  < > 101 103 104 107 109  CO2 30  < > 32 29 28 29 30   GLUCOSE 103*  < > 131* 120* 127* 125* 130*  BUN 11  < > 13 11 13 14 15   CREATININE 0.79  < > 0.77 0.69 0.83 0.96 0.92  CALCIUM 8.8  < > 8.5 8.1* 8.3* 8.7 8.7  MG 1.8  --  2.0 1.7 1.8  --  2.1  PHOS 2.8  --  1.1* 2.3 2.9  --  4.1  < > = values in this interval not displayed.  Estimated Creatinine Clearance: 69.2 ml/min (by C-G formula based on Cr of 0.92).  Intake/Output     06/02 0701 - 06/03 0700   I.V. (mL/kg) 325 (3.7)   IV Piggyback 450   TPN 1100   Total Intake(mL/kg) 1875 (21.3)   Urine (mL/kg/hr) 2000 (0.9)   Stool 1 (0)   Total Output 2001   Net -126        - I/O DETAILED x24h    Total I/O In: 720 [I.V.:160; TPN:560] Out: 800 [Urine:800] - I/O THIS SHIFT    ASSESSMENT   eICURN Interventions     ASSESSMENT: MAJOR ELECTROLYTE    Merita Norton 02/15/2013, 6:02 AM

## 2013-02-16 ENCOUNTER — Inpatient Hospital Stay (HOSPITAL_COMMUNITY): Payer: Medicare Other

## 2013-02-16 LAB — COMPREHENSIVE METABOLIC PANEL
ALT: 77 U/L — ABNORMAL HIGH (ref 0–53)
AST: 109 U/L — ABNORMAL HIGH (ref 0–37)
Albumin: 2.2 g/dL — ABNORMAL LOW (ref 3.5–5.2)
Alkaline Phosphatase: 73 U/L (ref 39–117)
BUN: 28 mg/dL — ABNORMAL HIGH (ref 6–23)
CO2: 34 mEq/L — ABNORMAL HIGH (ref 19–32)
Calcium: 9 mg/dL (ref 8.4–10.5)
Chloride: 103 mEq/L (ref 96–112)
Creatinine, Ser: 0.82 mg/dL (ref 0.50–1.35)
GFR calc Af Amer: >90 mL/min (ref >90)
GFR calc non Af Amer: 80 mL/min — ABNORMAL LOW (ref >90)
Glucose, Bld: 146 mg/dL — ABNORMAL HIGH (ref 70–99)
Potassium: 3.4 mEq/L — ABNORMAL LOW (ref 3.5–5.1)
Sodium: 142 mEq/L (ref 135–145)
Total Bilirubin: 0.9 mg/dL (ref 0.3–1.2)
Total Protein: 6.6 g/dL (ref 6.0–8.3)

## 2013-02-16 LAB — CBC
HCT: 40.3 % (ref 39.0–52.0)
Hemoglobin: 12.6 g/dL — ABNORMAL LOW (ref 13.0–17.0)
MCH: 27.6 pg (ref 26.0–34.0)
MCHC: 31.3 g/dL (ref 30.0–36.0)
MCV: 88.4 fL (ref 78.0–100.0)
Platelets: 336 10*3/uL (ref 150–400)
RBC: 4.56 MIL/uL (ref 4.22–5.81)
RDW: 17.6 % — ABNORMAL HIGH (ref 11.5–15.5)
WBC: 8.4 10*3/uL (ref 4.0–10.5)

## 2013-02-16 LAB — GLUCOSE, CAPILLARY
Glucose-Capillary: 130 mg/dL — ABNORMAL HIGH (ref 70–99)
Glucose-Capillary: 133 mg/dL — ABNORMAL HIGH (ref 70–99)
Glucose-Capillary: 128 mg/dL — ABNORMAL HIGH (ref 70–99)
Glucose-Capillary: 133 mg/dL — ABNORMAL HIGH (ref 70–99)
Glucose-Capillary: 127 mg/dL — ABNORMAL HIGH (ref 70–99)
Glucose-Capillary: 117 mg/dL — ABNORMAL HIGH (ref 70–99)

## 2013-02-16 LAB — MAGNESIUM: Magnesium: 2.4 mg/dL (ref 1.5–2.5)

## 2013-02-16 LAB — PHOSPHORUS: Phosphorus: 3.6 mg/dL (ref 2.3–4.6)

## 2013-02-16 MED ORDER — FAT EMULSION 20 % IV EMUL
240.0000 mL | INTRAVENOUS | Status: AC
  Administered 2013-02-16: 240 mL via INTRAVENOUS
  Filled 2013-02-16: qty 250

## 2013-02-16 MED ORDER — TRACE MINERALS CR-CU-F-FE-I-MN-MO-SE-ZN IV SOLN
INTRAVENOUS | Status: AC
  Administered 2013-02-16: 17:00:00 via INTRAVENOUS
  Filled 2013-02-16: qty 2000

## 2013-02-16 MED ORDER — POTASSIUM CHLORIDE 10 MEQ/100ML IV SOLN
10.0000 meq | INTRAVENOUS | Status: AC
  Administered 2013-02-16 (×4): 10 meq via INTRAVENOUS
  Filled 2013-02-16 (×4): qty 100

## 2013-02-16 NOTE — Progress Notes (Signed)
8 Days Post-Op  Subjective: Breathing easily.  Comfortable.  Objective: Vital signs in last 24 hours: Temp:  [98 F (36.7 C)-98.9 F (37.2 C)] 98 F (36.7 C) (06/04 0400) Pulse Rate:  [62-103] 95 (06/04 0500) Resp:  [18-26] 24 (06/04 0600) BP: (104-153)/(55-84) 137/67 mmHg (06/04 0600) SpO2:  [90 %-99 %] 90 % (06/04 0500) FiO2 (%):  [35 %-60 %] 40 % (06/04 0217) Last BM Date: 02/15/13  Intake/Output from previous day: 06/03 0701 - 06/04 0700 In: 2289 [I.V.:460; IV Piggyback:50; TPN:1779] Out: 3525 [Urine:3525] Intake/Output this shift: Total I/O In: 1133 [I.V.:220; TPN:913] Out: 2100 [Urine:2100]  PE: General- In NAD.  Nasal cannula on Abdomen-soft, incisions clean and intact  Lab Results:   Recent Labs  02/15/13 0706 02/16/13 0553  WBC 11.0* 8.4  HGB 13.4 12.6*  HCT 39.4 40.3  PLT 318 336   BMET  Recent Labs  02/14/13 0530 02/15/13 0706  NA 148* 143  K 3.3* 3.5  CL 109 106  CO2 30 30  GLUCOSE 130* 130*  BUN 15 21  CREATININE 0.92 0.79  CALCIUM 8.7 9.1   PT/INR No results found for this basename: LABPROT, INR,  in the last 72 hours Comprehensive Metabolic Panel:    Component Value Date/Time   NA 143 02/15/2013 0706   K 3.5 02/15/2013 0706   CL 106 02/15/2013 0706   CO2 30 02/15/2013 0706   BUN 21 02/15/2013 0706   CREATININE 0.79 02/15/2013 0706   GLUCOSE 130* 02/15/2013 0706   CALCIUM 9.1 02/15/2013 0706   AST 53* 02/14/2013 0530   ALT 34 02/14/2013 0530   ALKPHOS 56 02/14/2013 0530   BILITOT 1.3* 02/14/2013 0530   PROT 6.3 02/14/2013 0530   ALBUMIN 1.9* 02/14/2013 0530     Studies/Results: Dg Sniff Test  02/15/2013   *RADIOLOGY REPORT*  Clinical Data: Shortness of breath when the patient is supine.  SNIFF TEST  Fluoroscopy time:  1 minute, 18 seconds  Comparison: Chest x-ray 02/15/2013  Findings: There is elevation of the right hemidiaphragm, similar in appearance to chest x-ray.  Medial right lower lobe opacity is also noted.  With multiple maneuvers, movement of  the hemidiaphragms is noted to be symmetric. No paradoxical motion of the diaphragm noted.  IMPRESSION:  1.  Elevation of the right hemidiaphragm. 2.  No evidence for paralysis or paradoxical motion of the diaphragm.   Original Report Authenticated By: Norva Pavlov, M.D.   Dg Chest Port 1 View  02/15/2013   *RADIOLOGY REPORT*  Clinical Data: Respiratory distress.  PORTABLE CHEST - 1 VIEW  Comparison: Chest x-ray 02/14/2013.  Findings: There is a right-sided internal jugular central venous catheter with tip terminating in the superior cavoatrial junction and. Lung volumes are low.  Bibasilar opacities may reflect areas of atelectasis and/or consolidation.  Possible trace bilateral pleural effusions.  No evidence of pulmonary edema.  Heart size is within normal limits.  Atherosclerosis of the thoracic aorta. The patient is rotated to the left on today's exam, resulting in distortion of the mediastinal contours and reduced diagnostic sensitivity and specificity for mediastinal pathology.  IMPRESSION: 1.  Support apparatus, as above. 2.  Low lung volumes with bibasilar atelectasis and/or consolidation and probable trace bilateral pleural effusions. 3.  Atherosclerosis.   Original Report Authenticated By: Trudie Reed, M.D.    Anti-infectives: Anti-infectives   Start     Dose/Rate Route Frequency Ordered Stop   02/14/13 1400  cefTRIAXone (ROCEPHIN) 2 g in dextrose 5 % 50 mL IVPB  2 g 100 mL/hr over 30 Minutes Intravenous Every 24 hours 02/14/13 1143     02/12/13 1000  vancomycin (VANCOCIN) IVPB 1000 mg/200 mL premix  Status:  Discontinued     1,000 mg 200 mL/hr over 60 Minutes Intravenous Every 12 hours 02/12/13 0943 02/14/13 1137   02/11/13 2300  piperacillin-tazobactam (ZOSYN) IVPB 3.375 g  Status:  Discontinued     3.375 g 12.5 mL/hr over 240 Minutes Intravenous Every 8 hours 02/11/13 2156 02/14/13 1144   02/11/13 2300  vancomycin (VANCOCIN) IVPB 750 mg/150 ml premix  Status:  Discontinued      750 mg 150 mL/hr over 60 Minutes Intravenous Every 12 hours 02/11/13 2156 02/12/13 0943   02/11/13 2230  micafungin (MYCAMINE) 100 mg in sodium chloride 0.9 % 100 mL IVPB  Status:  Discontinued     100 mg 100 mL/hr over 1 Hours Intravenous Daily at bedtime 02/11/13 2137 02/13/13 1023   02/08/13 1700  ceFAZolin (ANCEF) IVPB 1 g/50 mL premix     1 g 100 mL/hr over 30 Minutes Intravenous Every 6 hours 02/08/13 1639 02/09/13 0539   02/08/13 0650  ceFAZolin (ANCEF) IVPB 2 g/50 mL premix     2 g 100 mL/hr over 30 Minutes Intravenous On call to O.R. 02/08/13 0650 02/08/13 0957      Assessment Principal Problem:   Hiatal hernia-recurrent s/p laparoscopic repair and Nissen fundoplication on 02/08/13 Active Problems:   Aspiration pneumonia-on abxs   Bell's palsy   Atrial fibrillation-resolved.   Acute respiratory failure-on nasal cannula and breathing comfortably this AM.   Protein-calorie malnutrition, severe-on TPN.   Aspiration into airway   Severe deconditioned state   Chronic elevation of right hemidiaphragm-sniff test negative  Overall he appears better.    LOS: 8 days   Plan:  Continue TPN for nutrition. Hold on oral diet.  Eventually when need ST to do swallowing study.  Victor Sutton J 02/16/2013

## 2013-02-16 NOTE — Progress Notes (Addendum)
PARENTERAL NUTRITION CONSULT NOTE - FOLLOW UP  Pharmacy Consult for TPN Indication: dysphagia with Bell's Palsy s/p hernia repair/Nissen fundoplication  No Known Allergies  Patient Measurements: Height: 5\' 10"  (177.8 cm) Weight: 193 lb 12.6 oz (87.9 kg) IBW/kg (Calculated) : 73  Vital Signs: Temp: 98 F (36.7 C) (06/04 0400) Temp src: Oral (06/04 0400) BP: 137/67 mmHg (06/04 0600) Pulse Rate: 95 (06/04 0500) Intake/Output from previous day: 06/03 0701 - 06/04 0700 In: 2289 [I.V.:460; IV Piggyback:50; TPN:1779] Out: 3525 [Urine:3525]  Labs:  Recent Labs  02/14/13 0530 02/15/13 0706 02/16/13 0553  WBC 11.3* 11.0* 8.4  HGB 12.8* 13.4 12.6*  HCT 39.8 39.4 40.3  PLT 254 318 336     Recent Labs  02/14/13 0530 02/15/13 0706 02/16/13 0553  NA 148* 143 142  K 3.3* 3.5 3.4*  CL 109 106 103  CO2 30 30 34*  GLUCOSE 130* 130* 146*  BUN 15 21 28*  CREATININE 0.92 0.79 0.82  CALCIUM 8.7 9.1 9.0  MG 2.1 2.3 2.4  PHOS 4.1 3.4 3.6  PROT 6.3  --  6.6  ALBUMIN 1.9*  --  2.2*  AST 53*  --  109*  ALT 34  --  77*  ALKPHOS 56  --  73  BILITOT 1.3*  --  0.9  PREALBUMIN 9.4*  --   --   TRIG 106  --   --   CHOL 118  --   --    Estimated Creatinine Clearance: 77.6 ml/min (by C-G formula based on Cr of 0.82).    Recent Labs  02/15/13 1941 02/15/13 2335 02/16/13 0355  GLUCAP 116* 133* 130*    Medications:  Scheduled:  . albuterol  2.5 mg Nebulization Q6H  . antiseptic oral rinse  15 mL Mouth Rinse QID  . cefTRIAXone (ROCEPHIN)  IV  2 g Intravenous Q24H  . chlorhexidine  15 mL Mouth Rinse BID  . furosemide  40 mg Intravenous Q12H  . heparin subcutaneous  5,000 Units Subcutaneous Q8H  . insulin aspart  0-9 Units Subcutaneous Q4H  . pantoprazole (PROTONIX) IV  40 mg Intravenous QHS   Infusions:  . Marland KitchenTPN (CLINIMIX-E) Adult 83 mL/hr at 02/15/13 1721  . sodium chloride 20 mL/hr at 02/14/13 1400    Nutritional Goals:   RD recs (5/30): 1980-2310 Kcal, 110-128 g  Protein, 2.5 L fluid  Clinimix 5/20 at 83 ml/hr + Lipids MWF to provide 100 g protein/day and an average of  1959 Kcal/day.   Current nutrition:   Diet: NPO  IVF: D5 NS at [facility]  Clinimix E 5/20 at 83 ml/hr  CBGs & Insulin requirements past 24 hours:   CBGs ordered q4h:  Within goal of < 150 mg/dl  Sensitive SSI :  5 [id number]  Assessment:  79 yo M admit 5/27 s/p laparoscopic hernia repair and Nissen fundoplication on 5/27.  The pt had aspiration during UGI swallow evaluation on 5/29.  He has a history of poor PO intake prior to admission and has severe malnutrition.  IV team unable to place PICC; CCM to place central line on 5/30.  6/4:  Day #6 TPN.  Extubated 5/1. TPN at goal and tolerating thus far, AST/ALT bumped this am   Renal function:  SCr is stable/WNL, excellent UOP with neg fluid balance (on lasix 40mg  IV q12)  Hepatic function: AST/ALT elevated, Tbili elevated improved to WNL  Electrolytes: K = 3.4, Mg = 2.4 (trending up), Corrected Ca =10.44 (ULN)  Pre-Albumin: 8.9 (5/31), 9.4 (  6/2)  TG/Cholesterol: WNL (5/31), 106 (6/2)  Glucose:  Within goal range (< 150 mg/dl)  Plan:  At 1800 tonight Continue Clinimix E 5/20 to goal 83 ml/hr tonight   - Prealbumin essentially unchanged from baseline (sl improvement) Fat emulsion at 10 ml/hr (MWF only due to ongoing shortage). Replaced K+ with KCl with 4 runs KCl Watch Mg level as trending up and approaching ULN - may need to remove lytes from TPN TNA to contain standard multivitamins and trace elements (MWF only due to ongoing shortage). Continue sensitive SSI q4h (CBGs q4h) TNA lab panels on Mondays & Thursdays.  Juliette Alcide, PharmD, BCPS.   Pager: [phone] 02/16/2013 7:17 AM

## 2013-02-16 NOTE — Evaluation (Signed)
Physical Therapy Evaluation Patient Details Name: Victor Sutton MRN: [id number] DOB: 1928-08-01 Today's Date: 02/16/2013 Time: 0865-7846 PT Time Calculation (min): 24 min  PT Assessment / Plan / Recommendation Clinical Impression  79 yo male admitted 02/08/13 for repair of hiatal hernia, VDRF in PACU, extubated  2nd time 6/01. Pt is on 4 l. O2 today. and tolerated mobilizing to edge of bed and to recliner. Sats remaine in 90's and RR 24. Per wife, Pt will benefit from post acute rehab, per wife, CIR at [facility]. Recommend OT consult. pt will benefit from PT while inacute care to improve functional mobility and strength.    PT Assessment  Patient needs continued PT services    Follow Up Recommendations  CIR    Does the patient have the potential to tolerate intense rehabilitation      Barriers to Discharge        Equipment Recommendations  None recommended by PT    Recommendations for Other Services Rehab consult;OT consult   Frequency Min 3X/week    Precautions / Restrictions Precautions Precautions: Fall Precaution Comments: monitor sats.   Pertinent Vitals/Pain Pre HR 87  RR 24 sats 95 4 l BP 130/80  Post  96% , BP 136/79      Mobility  Bed Mobility Bed Mobility: Sitting - Scoot to Edge of Bed;Left Sidelying to Sit Left Sidelying to Sit: 4: Min guard;With rails Sitting - Scoot to Edge of Bed: 4: Min assist Details for Bed Mobility Assistance: support to get trunk upright from sidelying. Transfers Transfers: Sit to Stand;Stand to Sit Sit to Stand: 3: Mod assist;From bed;With upper extremity assist;From elevated surface Stand to Sit: To chair/3-in-1;4: Min assist;With armrests;With upper extremity assist Details for Transfer Assistance: cues for pushing from bed, reaching to armrests. Ambulation/Gait Ambulation/Gait Assistance: 1: +2 Total assist Ambulation/Gait: Patient Percentage: 70% Ambulation Distance (Feet): 5 Feet Assistive device: Rolling  walker Ambulation/Gait Assistance Details: pt got R leg behind rail, pt was able to stand on L leg and lift right leg out. Gait Pattern: Step-to pattern    Exercises Total Joint Exercises Quad Sets: AROM;Both;5 reps Heel Slides: AROM;Both;5 reps Long Arc Quad: AROM;Both;5 reps   PT Diagnosis: Difficulty walking;Generalized weakness  PT Problem List: Decreased strength;Decreased activity tolerance;Decreased mobility;Decreased cognition;Decreased knowledge of use of DME;Decreased safety awareness;Decreased knowledge of precautions;Cardiopulmonary status limiting activity PT Treatment Interventions: DME instruction;Gait training;Functional mobility training;Therapeutic activities;Therapeutic exercise;Patient/family education   PT Goals Acute Rehab PT Goals PT Goal Formulation: With patient/family Time For Goal Achievement: 03/02/13 Pt will go Supine/Side to Sit: with supervision PT Goal: Supine/Side to Sit - Progress: Goal set today Pt will go Sit to Supine/Side: with supervision PT Goal: Sit to Supine/Side - Progress: Goal set today Pt will go Sit to Stand: with supervision PT Goal: Sit to Stand - Progress: Goal set today Pt will go Stand to Sit: with supervision PT Goal: Stand to Sit - Progress: Goal set today Pt will Ambulate: 51 - 150 feet;with supervision;with rolling walker PT Goal: Ambulate - Progress: Goal set today Pt will Perform Home Exercise Program: with supervision, verbal cues required/provided PT Goal: Perform Home Exercise Program - Progress: Goal set today  Visit Information  Last PT Received On: 02/16/13 Assistance Needed: +2    Subjective Data  Subjective: I should have been more disabled.   Prior Functioning  Home Living Lives With: Spouse Available Help at Discharge: Family Type of Home: House Home Access: Stairs to enter [employer] of Steps: 3 Entrance Stairs-Rails: None  Home Layout: One level Bathroom Shower/Tub: Therapist, art: Standard Home Adaptive Equipment: Bedside commode/3-in-1;Walker - rolling;Tub transfer bench Prior Function Level of Independence: Independent Able to Take Stairs?: Yes Vocation: Retired Musician: Surveyor, mining Arousal/Alertness: Awake/alert Behavior During Therapy: WFL for tasks assessed/performed Overall Cognitive Status: Within Functional Limits for tasks assessed    Extremity/Trunk Assessment Right Lower Extremity Assessment RLE ROM/Strength/Tone: WFL for tasks assessed RLE Sensation: WFL - Light Touch Left Lower Extremity Assessment LLE ROM/Strength/Tone: WFL for tasks assessed LLE Sensation: WFL - Light Touch Trunk Assessment Trunk Assessment: Kyphotic   Balance Balance Balance Assessed: Yes Static Standing Balance Static Standing - Balance Support: Bilateral upper extremity supported Static Standing - Level of Assistance: 4: Min assist Static Standing - Comment/# of Minutes: at [employer]  End of Session PT - End of Session Equipment Utilized During Treatment: Oxygen Activity Tolerance: Patient tolerated treatment well Patient left: in chair;with call bell/phone within reach;with family/visitor present;with nursing in room Nurse Communication: Mobility status  GP     Rada Hay 02/16/2013, 10:29 AM  Blanchard Kelch PT [phone]

## 2013-02-16 NOTE — Progress Notes (Signed)
Rehab Admissions Coordinator Note:  Patient was screened by Victor Sutton for appropriateness for an Inpatient Acute Rehab Consult.  At this time, we are recommending Inpatient Rehab consult as well as OT eval. Please order or call me for any questions.   Victor Sutton 02/16/2013, 11:13 AM  I can be reached at [phone].

## 2013-02-16 NOTE — Progress Notes (Signed)
Spoke with pt in regards to nighttime Bipap, pt states not ready to go on at this time, will check back later.

## 2013-02-16 NOTE — Progress Notes (Signed)
PULMONARY  / CRITICAL CARE MEDICINE  Name: NEWT LEVINGSTON MRN: [id number] DOB: 06-21-1928    ADMISSION DATE:  02/08/2013 CONSULTATION DATE:  02/08/13  REFERRING MD :  Okey Dupre PRIMARY SERVICE: Surgery  CHIEF COMPLAINT:  S/P hiatal hernia repair  BRIEF PATIENT DESCRIPTION: 79 year old male s/p open repair of hiatal hernia 02/08/13 admitted following respiratory failure and A-fib with RVR in PACU.  LINES / TUBES: 5/27 ETT>>> 5/28 5/30 ETT >> 6/01 R IJ CVL 5/30 >>   CULTURES: BAL 5/30 >> 30 k serratia Blood 5/30 >>  ANTIBIOTICS: Micafungin 5/30 >> 6/01 Vanc 5/30 >> 6/02 Zosyn 5/30 >> 6/02 Rocephin 6/02 >>    SIGNIFICANT EVENTS / STUDIES:  5/27 S/P open repair of hiatal hernia 5/29 Gastrograffin swallow study > aspirated 5/30 Intubated for respiratory distress, bronchoscopy 6/03 SNIFF test negative  SUBJECTIVE: Denies chest pain.  Abdominal pain better.  Still has chest congestion, but decreased.  Feels BiPAP helps his breathing.  VITAL SIGNS: Temp:  [98 F (36.7 C)-98.6 F (37 C)] 98 F (36.7 C) (06/04 0400) Pulse Rate:  [62-103] 83 (06/04 0800) Resp:  [19-26] 26 (06/04 0800) BP: (104-153)/(55-84) 130/80 mmHg (06/04 0800) SpO2:  [90 %-99 %] 95 % (06/04 0800) FiO2 (%):  [35 %-40 %] 40 % (06/04 0217) 4 liters [place]  INTAKE / OUTPUT: Intake/Output     06/03 0701 - 06/04 0700 06/04 0701 - 06/05 0700   I.V. (mL/kg) 480 (5.5) 20 (0.2)   IV Piggyback 50    TPN 1862 83   Total Intake(mL/kg) 2392 (27.2) 103 (1.2)   Urine (mL/kg/hr) 3525 (1.7)    Stool     Total Output 3525     Net -1133 +103        Stool Occurrence 2 x      PHYSICAL EXAMINATION: General: NAD, sitting in chair Neuro: no focal deficits HEENT: WNL Cardiovascular:  RRR s M Lungs: scattered rhonchi, off NIPPV currently(used during the night) Abdomen:  Soft, NT,bm x 2 , no bs Musculoskeletal:  MAE well and equal. No edema   LABS:  BMET    Component Value Date/Time   NA 142 02/16/2013 0553   K 3.4*  02/16/2013 0553   CL 103 02/16/2013 0553   CO2 34* 02/16/2013 0553   GLUCOSE 146* 02/16/2013 0553   BUN 28* 02/16/2013 0553   CREATININE 0.82 02/16/2013 0553   CALCIUM 9.0 02/16/2013 0553   GFRNONAA 80* 02/16/2013 0553   GFRAA >90 02/16/2013 0553    CBC    Component Value Date/Time   WBC 8.4 02/16/2013 0553   RBC 4.56 02/16/2013 0553   HGB 12.6* 02/16/2013 0553   HCT 40.3 02/16/2013 0553   PLT 336 02/16/2013 0553   MCV 88.4 02/16/2013 0553   MCH 27.6 02/16/2013 0553   MCHC 31.3 02/16/2013 0553   RDW 17.6* 02/16/2013 0553   LYMPHSABS 1.0 02/14/2013 0530   MONOABS 0.9 02/14/2013 0530   EOSABS 0.0 02/14/2013 0530   BASOSABS 0.0 02/14/2013 0530    CXR: Dg Sniff Test  02/15/2013   *RADIOLOGY REPORT*  Clinical Data: Shortness of breath when the patient is supine.  SNIFF TEST  Fluoroscopy time:  1 minute, 18 seconds  Comparison: Chest x-ray 02/15/2013  Findings: There is elevation of the right hemidiaphragm, similar in appearance to chest x-ray.  Medial right lower lobe opacity is also noted.  With multiple maneuvers, movement of the hemidiaphragms is noted to be symmetric. No paradoxical motion of the diaphragm noted.  IMPRESSION:  1.  Elevation of the right hemidiaphragm. 2.  No evidence for paralysis or paradoxical motion of the diaphragm.   Original Report Authenticated By: Norva Pavlov, M.D.   Dg Chest Port 1 View  02/16/2013   *RADIOLOGY REPORT*  Clinical Data: Congestive heart failure, pneumonia  PORTABLE CHEST - 1 VIEW  Comparison: 02/15/2013  Findings: Cardiomediastinal silhouette is stable.  Right IJ central line is unchanged in position.  There is poor inspiration with right basilar atelectasis or infiltrate.  Minimal left basilar atelectasis.  No pulmonary edema.  IMPRESSION: Poor inspiration with right basilar atelectasis or infiltrate. Mild left basilar atelectasis.  No pulmonary edema.  Stable right IJ central line position.   Original Report Authenticated By: Natasha Mead, M.D.   Dg Chest Port 1 View  02/15/2013    *RADIOLOGY REPORT*  Clinical Data: Respiratory distress.  PORTABLE CHEST - 1 VIEW  Comparison: Chest x-ray 02/14/2013.  Findings: There is a right-sided internal jugular central venous catheter with tip terminating in the superior cavoatrial junction and. Lung volumes are low.  Bibasilar opacities may reflect areas of atelectasis and/or consolidation.  Possible trace bilateral pleural effusions.  No evidence of pulmonary edema.  Heart size is within normal limits.  Atherosclerosis of the thoracic aorta. The patient is rotated to the left on today's exam, resulting in distortion of the mediastinal contours and reduced diagnostic sensitivity and specificity for mediastinal pathology.  IMPRESSION: 1.  Support apparatus, as above. 2.  Low lung volumes with bibasilar atelectasis and/or consolidation and probable trace bilateral pleural effusions. 3.  Atherosclerosis.   Original Report Authenticated By: Trudie Reed, M.D.   CBG (last 3)   Recent Labs  02/15/13 1941 02/15/13 2335 02/16/13 0355  GLUCAP 116* 133* 130*      ASSESSMENT / PLAN:  PULMONARY A: Acute post op respiratory failure s/p repair of hiatal hernia. 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 >> negative SNIFF test P:   -Close monitoring in ICU -BiPAP qhs and prn during day -f/u CXR intermittently  CARDIOVASCULAR A: A-flutter with RVR > NSR . P:  -Monitor on tele -diuresis 6-4 continue bid lasix x 24 more hours then decrease to QD. -strict i o -BP control  RENAL A: Hypokalemia. P:   -f/u bmet and replace electrolytes as needed -follow I&O trend  GASTROINTESTINAL A:  S/P repair of hiatal hernia and Nissen Fundoplication 02/10/13. Dysphagia. P:   -Continue TPN per CCS -Will need to address dysphagia in near future. 6-4 will order bedside swallow eval. Hesitant for him to lay flat at this time. Will seek input from CCS concerning further swallow evaluation.   HEMATOLOGIC A:  No active issues P:   -Monitor -SQ heparin for DVT prevention  INFECTIOUS A:  Aspiration PNA/pneumonitis >> Serratia in sputum. P:   -D5/x Abx, currently on rocephin  ENDOCRINE A: Hyperglycemia on TPN  P:   -Continue SSI  NEUROLOGIC A: Recent hx of Bell's palsy.  Anxiety. P:   -monitor neurologic exam -gentle anxiolytics   6-4 less dyspneic. 6-4 swallow eval ordered   Brett Canales Minor ACNP [facility] PCCM Pager [phone] till 3 pm If no answer page [phone] 02/16/2013, 8:37 AM   Reviewed above, examined pt, and agree with assessment/plan.  He looks much better.  SNIFF test negative from 6/03.  Will continue qhs BiPAP, and try to use less during day.  Speech therapy to assess swallowing.  Continue TNA per CCS for now.  Continue with PT.  Continue rocephin for Serratia  pneumonia.  Updated wife at bedside.  Coralyn Helling, MD [facility] 02/16/2013, 10:10 AM Pager:  [phone] After 3pm call: [phone]

## 2013-02-17 DIAGNOSIS — R5381 Other malaise: Secondary | ICD-10-CM

## 2013-02-17 DIAGNOSIS — R1310 Dysphagia, unspecified: Secondary | ICD-10-CM

## 2013-02-17 LAB — GLUCOSE, CAPILLARY
Glucose-Capillary: 117 mg/dL — ABNORMAL HIGH (ref 70–99)
Glucose-Capillary: 120 mg/dL — ABNORMAL HIGH (ref 70–99)
Glucose-Capillary: 114 mg/dL — ABNORMAL HIGH (ref 70–99)
Glucose-Capillary: 127 mg/dL — ABNORMAL HIGH (ref 70–99)
Glucose-Capillary: 128 mg/dL — ABNORMAL HIGH (ref 70–99)
Glucose-Capillary: 117 mg/dL — ABNORMAL HIGH (ref 70–99)

## 2013-02-17 LAB — COMPREHENSIVE METABOLIC PANEL
ALT: 102 U/L — ABNORMAL HIGH (ref 0–53)
AST: 121 U/L — ABNORMAL HIGH (ref 0–37)
Albumin: 2.3 g/dL — ABNORMAL LOW (ref 3.5–5.2)
Alkaline Phosphatase: 84 U/L (ref 39–117)
BUN: 33 mg/dL — ABNORMAL HIGH (ref 6–23)
CO2: 33 mEq/L — ABNORMAL HIGH (ref 19–32)
Calcium: 9.3 mg/dL (ref 8.4–10.5)
Chloride: 103 mEq/L (ref 96–112)
Creatinine, Ser: 0.84 mg/dL (ref 0.50–1.35)
GFR calc Af Amer: >90 mL/min (ref >90)
GFR calc non Af Amer: 79 mL/min — ABNORMAL LOW (ref >90)
Glucose, Bld: 115 mg/dL — ABNORMAL HIGH (ref 70–99)
Potassium: 3.5 mEq/L (ref 3.5–5.1)
Sodium: 143 mEq/L (ref 135–145)
Total Bilirubin: 0.8 mg/dL (ref 0.3–1.2)
Total Protein: 6.9 g/dL (ref 6.0–8.3)

## 2013-02-17 LAB — CBC
HCT: 42 % (ref 39.0–52.0)
Hemoglobin: 12.9 g/dL — ABNORMAL LOW (ref 13.0–17.0)
MCH: 27.2 pg (ref 26.0–34.0)
MCHC: 30.7 g/dL (ref 30.0–36.0)
MCV: 88.6 fL (ref 78.0–100.0)
Platelets: 358 10*3/uL (ref 150–400)
RBC: 4.74 MIL/uL (ref 4.22–5.81)
RDW: 17.4 % — ABNORMAL HIGH (ref 11.5–15.5)
WBC: 8.7 10*3/uL (ref 4.0–10.5)

## 2013-02-17 LAB — MAGNESIUM: Magnesium: 2.5 mg/dL (ref 1.5–2.5)

## 2013-02-17 LAB — PHOSPHORUS: Phosphorus: 4 mg/dL (ref 2.3–4.6)

## 2013-02-17 MED ORDER — CLINIMIX E/DEXTROSE (5/20) 5 % IV SOLN
INTRAVENOUS | Status: AC
  Administered 2013-02-17: 17:00:00 via INTRAVENOUS
  Filled 2013-02-17: qty 2000

## 2013-02-17 MED ORDER — POTASSIUM CHLORIDE 10 MEQ/50ML IV SOLN
10.0000 meq | INTRAVENOUS | Status: AC
  Administered 2013-02-17 (×2): 10 meq via INTRAVENOUS
  Filled 2013-02-17 (×2): qty 50

## 2013-02-17 NOTE — Progress Notes (Signed)
Physical Therapy Treatment Patient Details Name: Victor Sutton MRN: [id number] DOB: Apr 20, 1928 Today's Date: 02/17/2013 Time: 4098-1191 PT Time Calculation (min): 24 min  PT Assessment / Plan / Recommendation Comments on Treatment Session  Pt is very motivated to walk. Pt did amb. x 10 ft. Tolerted well. sats > 95% on 2 l. HR 96-112 with activity. Continue to recommend CIR.    Follow Up Recommendations  CIR     Does the patient have the potential to tolerate intense rehabilitation     Barriers to Discharge        Equipment Recommendations  None recommended by PT    Recommendations for Other Services Rehab consult;OT consult  Frequency Min 3X/week   Plan Discharge plan remains appropriate;Frequency remains appropriate    Precautions / Restrictions Precautions Precautions: Fall Precaution Comments: monitor sats. mulitple lines Restrictions Weight Bearing Restrictions: No   Pertinent Vitals/Pain sats >95% 2 l HR 96-112.    Mobility  Bed Mobility Bed Mobility: Rolling Left Rolling Left: 5: Supervision;With rail Left Sidelying to Sit: With rails;4: Min assist Details for Bed Mobility Assistance: support to get trunk upright from sidelying. cues to slow down. Transfers Sit to Stand: 3: Mod assist;From bed;With upper extremity assist;From elevated surface;From chair/3-in-1;With armrests Stand to Sit: To chair/3-in-1;4: Min assist;With armrests;With upper extremity assist Details for Transfer Assistance: cues for pushing from bed, reaching to armrests. Ambulation/Gait Ambulation/Gait Assistance: 1: +2 Total assist Ambulation/Gait: Patient Percentage: 70% Ambulation Distance (Feet): 10 Feet Assistive device: Rolling walker Ambulation/Gait Assistance Details: pt is steady to stand and walk, limited by central line/tubes. Gait Pattern: Step-through pattern    Exercises     PT Diagnosis:    PT Problem List:   PT Treatment Interventions:     PT Goals Acute Rehab PT  Goals Pt will go Supine/Side to Sit: with supervision PT Goal: Supine/Side to Sit - Progress: Progressing toward goal Pt will go Sit to Stand: with supervision PT Goal: Sit to Stand - Progress: Progressing toward goal Pt will go Stand to Sit: with supervision PT Goal: Stand to Sit - Progress: Progressing toward goal Pt will Ambulate: 51 - 150 feet;with supervision;with rolling walker PT Goal: Ambulate - Progress: Progressing toward goal Pt will Perform Home Exercise Program: with supervision, verbal cues required/provided PT Goal: Perform Home Exercise Program - Progress: Progressing toward goal  Visit Information  Last PT Received On: 02/17/13 Assistance Needed: +2    Subjective Data  Subjective: I want to walk. What is the criteria for going to [facility].   Cognition  Cognition Arousal/Alertness: Awake/alert Behavior During Therapy: WFL for tasks assessed/performed    Balance     End of Session PT - End of Session Equipment Utilized During Treatment: Oxygen Activity Tolerance: Patient tolerated treatment well Patient left: in chair;with call bell/phone within reach;with family/visitor present;with nursing in room Nurse Communication: Mobility status   GP     Rada Hay 02/17/2013, 10:07 AM Blanchard Kelch PT [phone]

## 2013-02-17 NOTE — Progress Notes (Signed)
NUTRITION FOLLOW UP  Intervention:   TPN per PharmD. TPN currently at goal: Clinimix 5/20 at 83 ml/hr + Lipids MWF to provide 100 g protein/day and an average of 1959 Kcal/day. This meets 100% of re-estimated kcal needs and 100% of re-estimated protein needs. Advance diet per SLP recommendations If pt fails swallow eval, recommend PEG placement  Nutrition Dx:   Inadequate oral intake related to inability to eat as evidenced by pt NPO s/p hernia repair with swallowing difficulty; ongoing  Goal:   Pt to meet >/= 90% of their estimated nutrition needs; being met   Monitor:   TPN; at goal and meeting needs Weight; 21 lb wt loss from 5/30 to 6/5 (pt now at reported weight from MD PTA) Labs  SLP recommendations   Assessment:   79 year old male s/p open repair of hiatal hernia 02/08/13 admitted following respiratory failure and A-fib with RVR in PACU.   5/30: Pt on BiPap mask at time of visit, RD spoke with pt's wife in room. Wife reports that she was pureeing foods for pt PTA and he was eating just a few tablespoons of food daily as well as sipping on one Ensure supplement throughout the day. Wife reports that pt was feeling very weak at home. Wife states that pt usually weighs 220 lbs but, recently weighed 180 lbs at an MD appointment PTA. Per wife's report pt lost 40 lbs in less than 2 months (19% wt loss). . Based on current weight and wt history pt has had 9% wt loss in less than 2 months.   6/5: TPN is at goal rate and meeting pt's estimated needs. Pt seems in good spirits today; reports that he feels very hungry and desires to eat. Plan is for pt to have a swallow evaluation in the near future. Addressed pt's questions about tube feedings; RD to follow-up with pt after swallow evaluation is performed.   Height: Ht Readings from Last 1 Encounters:  02/08/13 5\' 10"  (1.778 m)    Weight Status:   Wt Readings from Last 1 Encounters:  02/17/13 180 lb 5.4 oz (81.8 kg)    Re-estimated  needs:  Kcal: 5621-3086 Protein: 98-114 grams Fluid: 2.4 L  Skin: +1 generalized edema, +1 RUE and LUE edema, non-pitting RLE and LLE edema  Diet Order: NPO   Intake/Output Summary (Last 24 hours) at 02/17/13 1256 Last data filed at 02/17/13 0900  Gross per 24 hour  Intake   2187 ml  Output   2631 ml  Net   -444 ml    Last BM: 6/3   Labs:   Recent Labs Lab 02/15/13 0706 02/16/13 0553 02/17/13 0415  NA 143 142 143  K 3.5 3.4* 3.5  CL 106 103 103  CO2 30 34* 33*  BUN 21 28* 33*  CREATININE 0.79 0.82 0.84  CALCIUM 9.1 9.0 9.3  MG 2.3 2.4 2.5  PHOS 3.4 3.6 4.0  GLUCOSE 130* 146* 115*    CBG (last 3)   Recent Labs  02/17/13 0416 02/17/13 0755 02/17/13 1227  GLUCAP 117* 120* 127*    Scheduled Meds: . albuterol  2.5 mg Nebulization Q6H  . antiseptic oral rinse  15 mL Mouth Rinse QID  . cefTRIAXone (ROCEPHIN)  IV  2 g Intravenous Q24H  . chlorhexidine  15 mL Mouth Rinse BID  . furosemide  40 mg Intravenous Q12H  . heparin subcutaneous  5,000 Units Subcutaneous Q8H  . insulin aspart  0-9 Units Subcutaneous Q4H  .  pantoprazole (PROTONIX) IV  40 mg Intravenous QHS    Continuous Infusions: . sodium chloride 20 mL/hr at 02/14/13 1400  . Marland KitchenTPN (CLINIMIX-E) Adult 83 mL/hr at 02/16/13 1717   And  . fat emulsion 240 mL (02/16/13 1717)  . Marland KitchenTPN (CLINIMIX-E) Adult      Ian Malkin RD, LDN Inpatient Clinical Dietitian Pager: [phone] After Hours Pager: [phone]

## 2013-02-17 NOTE — Progress Notes (Signed)
CARE MANAGEMENT NOTE 02/17/2013  Patient:  Victor Sutton, Victor Sutton   Account Number:  [id number]  Date Initiated:  02/09/2013  Documentation initiated by:  DAVIS,RHONDA  Subjective/Objective Assessment:   patient had fundoplication on 05272014/s/p surg a.fib with rvr, resp distress-intubated     Action/Plan:   lives at home with his wife   Anticipated DC Date:  02/20/2013   Anticipated DC Plan:  IP REHAB FACILITY  In-house referral  Clinical Social Worker      DC Associate Professor  CM consult      [facility] Choice  NA   Choice offered to / List presented to:  NA   DME arranged  NA      DME agency  NA     HH arranged  NA      HH agency  NA   Status of service:  In process, will continue to follow Medicare Important Message given?  NA - LOS <3 / Initial given by admissions (If response is "NO", the following Medicare IM given date fields will be blank) Date Medicare IM given:   Date Additional Medicare IM given:    Discharge Disposition:    Per UR Regulation:  Reviewed for med. necessity/level of care/duration of stay  If discussed at Long Length of Stay Meetings, dates discussed:    Comments:  [id number]/Rhonda Earlene Plater, RN, BSN, CCM: CHART REVIEWED AND UPDATED.  Next chart review due on 16109604. NO DISCHARGE NEEDS PRESENT AT THIS TIME. CASE MANAGEMENT [phone]   [id number]/HYQMVH Earlene Plater, RN, BSN, CCM:  CHART REVIEWED AND UPDATED.  Next chart review due on [id number]. NO DISCHARGE NEEDS PRESENT AT THIS TIME. CASE MANAGEMENT [phone]   [id number]/[id number] Earlene Plater, RN, BSN, CCM:  CHART REVIEWED AND UPDATED.  Next chart review due on [id number]. NO DISCHARGE NEEDS PRESENT AT THIS TIME. CASE MANAGEMENT [phone]

## 2013-02-17 NOTE — Progress Notes (Signed)
Pt would like swallow eval to be scheduled around the time he receives his am dose of Lasix.  Lasix will be given around 0600 on 6/6 am.  Can swallow eval be scheduled after 10am so that he will not need to use urinal frequently during evaluation?  Thanks.

## 2013-02-17 NOTE — Clinical Social Work Psychosocial (Signed)
Clinical Social Work Department BRIEF PSYCHOSOCIAL ASSESSMENT 02/17/2013  Patient:  Victor Sutton, Victor Sutton     Account Number:  [id number]     Admit date:  02/08/2013  Clinical Social Worker:  Jodelle Red  Date/Time:  02/17/2013 11:48 AM  Referred by:  CSW  Date Referred:  02/17/2013 Referred for  SNF Placement   Other Referral:   SUPPORT FOR LENGTHY HOSPITALIZATION   Interview type:  Patient Other interview type:   WIFE AND SON    PSYCHOSOCIAL DATA Living Status:  WIFE Admitted from facility:   Level of care:   Primary support name:  [place] Primary support relationship to patient:  SPOUSE Degree of support available:   GOOD FROM WIFE AND SON    CURRENT CONCERNS Current Concerns  Post-Acute Placement   Other Concerns:   WILL NEED SNF AS A BACK UP FOR CIR    SOCIAL WORK ASSESSMENT / PLAN CSW met with wife and Pt to discuss need for rehab after hospitalization. They are both eager for CIR admission for this purpose and should be evaluated soon. CSW explained the need for a backup plan if not accepted. Pt and wife stated understanding, but are very eager for CIR. CSW provided SNF list and discussed process for [facility] and how it can take time. Pt and wife will consider [facility], [facility] and [facility] as back up to CIR.   Assessment/plan status:  Psychosocial Support/Ongoing Assessment of Needs Other assessment/ plan:   begin SNF search   Information/referral to community resources:   SNF list provided    PATIENT'S/FAMILY'S RESPONSE TO PLAN OF CARE: CSW to begin SNF search. Pt and wife eager for CIR, but agree to SNF as back up. CSW to follow. Pt progressing well.   Doreen Salvage, LCSW ICU/Stepdown Clinical Social Worker [facility] Cell [phone] Hours 8am-1200pm M-F

## 2013-02-17 NOTE — Progress Notes (Signed)
eLink Nursing ICU Electrolyte Replacement Protocol  Patient Name: Victor Sutton DOB: 10/20/1927 MRN: [id number]  Date of Service  02/17/2013 K+ 3.5  Electrolyte protocol criteria met. Labs replaced per protocol. MD notified.  HPI/Events of Note    Recent Labs Lab 02/13/13 0630 02/13/13 2126 02/14/13 0530 02/15/13 0706 02/16/13 0553 02/17/13 0415  NA 140 144 148* 143 142 143  K 2.8* 3.5 3.3* 3.5 3.4* 3.5  CL 104 107 109 106 103 103  CO2 28 29 30 30  34* 33*  GLUCOSE 127* 125* 130* 130* 146* 115*  BUN 13 14 15 21  28* 33*  CREATININE 0.83 0.96 0.92 0.79 0.82 0.84  CALCIUM 8.3* 8.7 8.7 9.1 9.0 9.3  MG 1.8  --  2.1 2.3 2.4 2.5  PHOS 2.9  --  4.1 3.4 3.6 4.0    Estimated Creatinine Clearance: 70 ml/min (by C-G formula based on Cr of 0.84).  Intake/Output     06/04 0701 - 06/05 0700   I.V. (mL/kg) 440 (5.4)   IV Piggyback 450   TPN 1936   Total Intake(mL/kg) 2826 (34.5)   Urine (mL/kg/hr) 3420 (1.7)   Stool 1 (0)   Total Output 3421   Net -595       Stool Occurrence 1 x    - I/O DETAILED x24h    Total I/O In: 1130 [I.V.:200; TPN:930] Out: 820 [Urine:820] - I/O THIS SHIFT    ASSESSMENT   eICURN Interventions     ASSESSMENT: MAJOR ELECTROLYTE    Merita Norton 02/17/2013, 5:57 AM

## 2013-02-17 NOTE — Evaluation (Signed)
SLP Cancellation Note  Patient Details Name: TAESEAN RETH MRN: [id number] DOB: [birth year]   Cancelled treatment:       Reason Eval/Treat Not Completed: Other (comment) (per surgery team, does not desire swallow eval today, defer )  Will return next date for evaluation as ordered.  Thanks   [name], MS [facility] SLP [phone]

## 2013-02-17 NOTE — Consult Note (Addendum)
Physical Medicine and Rehabilitation Consult Reason for Consult: Deconditioning Referring Physician: Dr. Abbey Chatters   HPI: Victor Sutton is a 79 y.o. male with history of HTN, B12 deficiency, recent Bell's palsy; who was admitted to [facility] on 02/08/13 for lysis of adhensions, lap redo of hiatal hernia repair with lap Nissen fundoplication by Dr. Abbey Chatters. Post op with respiratory failure and A fib with RVR in PACU. Evaluated by Dr. Jens Som who recommended amiodarone for 6-8 weeks for treatment. Patient with aspiration of contrast during UGI on 05/29 with respiratory distress requiring BIPAP. Intubated on 02/11/13 for recurrent aspiration with aspiration pneumonitis. Was started on Vancomycin and Zosyn for treatment with TNA for nutritional support.  Tolerated extubation 06/01 to BIPAP and antibiotics narrowed to rocephin on for Serratia in BAL.  To remain NPO due to concerns of aspiration per Dr. Abbey Chatters.  PT evaluation done yesterday and patient noted to be deconditioned. MD, PT recommending CIR.   The patient reportedly has another swallowing study tomorrow. Considering PEG placement  Review of Systems  HENT: Positive for hearing loss. Negative for neck pain.   Eyes: Positive for blurred vision (Lack of central vision right eye).  Respiratory: Positive for shortness of breath (DOE).   Cardiovascular: Negative for chest pain, palpitations and leg swelling.  Gastrointestinal: Positive for heartburn (has had to sleep upright in a chair for the last 6 weeks.  And was only able to eat bites of pureed foods. ).       Reports difficulty swallowing and change in voice since surgery.   Musculoskeletal: Negative for myalgias, back pain and joint pain.  Neurological: Positive for weakness. Negative for headaches.  Psychiatric/Behavioral: Negative for depression. The patient does not have insomnia.    Past Medical History  Diagnosis Date  . BPH (benign prostatic hyperplasia)   . Macular  degeneration     (L) wet, (R) dry  . B12 deficiency     HIstory  . 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 Leukoplakia 2001    Treated for  . Hypertension   . Iron deficiency anemia   . Cataract 2009    bilateral cataract surgery  . GERD (gastroesophageal reflux disease)   . Hyperlipidemia     not on medication  . Hiatal hernia   . Peripheral neuropathy     Mild History of  . Ulcer   . Bell's palsy     at present   Past Surgical History  Procedure Laterality Date  . Cholecystectomy    . Arthroscopic knee surgery      left  . Multiple oral surgeries  2002  . Tonsillectomy    . Cateract surgeries       bilateral  . Stomach surgery  2005    states his stomach had moved up toward his chest , some type of surgery performed  . Hernia repair      Laparoscopic ventral  . Hiatal hernia repair  2004  . Skin cancer excision  10/2012    right leg-shin area  . Hiatal hernia repair N/A 02/08/2013    Procedure:  REPAIR OFCURRENT HIATAL HERNIA, ;  Surgeon: Adolph Pollack, MD;  Location: [facility];  Service: General;  Laterality: N/A;  . Upper gi endoscopy N/A 02/08/2013    Procedure: UPPER GI ENDOSCOPY;  Surgeon: Adolph Pollack, MD;  Location: [facility];  Service: General;  Laterality: N/A;  . Laparoscopic lysis of adhesions N/A 02/08/2013    Procedure: LAPAROSCOPIC LYSIS OF ADHESIONS;  Surgeon:  Adolph Pollack, MD;  Location: [facility];  Service: General;  Laterality: N/A;  . Laparoscopic nissen fundoplication  02/08/2013    Procedure: LAPAROSCOPIC NISSEN FUNDOPLICATION;  Surgeon: Adolph Pollack, MD;  Location: [facility];  Service: General;;   Family History  Problem Relation Age of Onset  . Prostate cancer Paternal Grandfather   . Breast cancer Sister   . CAD Sister   . COPD Sister   . Heart attack Father    Social History: Married. 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. . Was still volunteering internationally--last two years ago. He reports that he quit smoking  about 36 years ago. He has never used smokeless tobacco. He reports that  drinks alcohol. He reports that he does not use illicit drugs.  Allergies: No Known Allergies  Medications Prior to Admission  Medication Sig Dispense Refill  . acetaminophen (TYLENOL) 325 MG tablet Take 650 mg by mouth every 6 (six) hours as needed for pain.      . Multiple Vitamins-Minerals (ICAPS MV PO) Take 1 tablet by mouth 2 (two) times daily.       Marland Kitchen OVER THE COUNTER MEDICATION tums 1- 2 as needed for acid reflux      . pantoprazole (PROTONIX) 40 MG tablet Take 40 mg by mouth 2 (two) times daily before a meal.       . solifenacin (VESICARE) 5 MG tablet Take 2.5 mg by mouth 2 (two) times daily.       . Tamsulosin HCl (FLOMAX) 0.4 MG CAPS Take 0.8 mg by mouth daily.       . mupirocin ointment (BACTROBAN) 2 % Apply 1 application topically 2 (two) times daily.        Home: Home Living Lives With: Spouse Available Help at Discharge: Family Type of Home: House Home Access: Stairs to enter Secretary/administrator of Steps: 3 Entrance Stairs-Rails: None Home Layout: One level Bathroom Shower/Tub: Health visitor: Standard Home Adaptive Equipment: Bedside commode/3-in-1;Walker - rolling;Tub transfer bench  Functional History: Prior Function Able to Take Stairs?: Yes Vocation: Retired Functional Status:  Mobility: Bed Mobility Bed Mobility: Sitting - Scoot to [place] of Bed;Left Sidelying to Sit Left Sidelying to Sit: 4: Min guard;With rails Sitting - Scoot to Edge of Bed: 4: Min assist Transfers Transfers: Sit to Stand;Stand to Sit Sit to Stand: 3: Mod assist;From bed;With upper extremity assist;From elevated surface Stand to Sit: To chair/3-in-1;4: Min assist;With armrests;With upper extremity assist Ambulation/Gait Ambulation/Gait Assistance: 1: +2 Total assist Ambulation/Gait: Patient Percentage: 70% Ambulation Distance (Feet): 5 Feet Assistive device: Rolling walker Ambulation/Gait  Assistance Details: pt got R leg behind rail, pt was able to stand on L leg and lift right leg out. Gait Pattern: Step-to pattern    ADL:    Cognition: Cognition Overall Cognitive Status: Within Functional Limits for tasks assessed Arousal/Alertness: Awake/alert Orientation Level: Oriented X4 Cognition Arousal/Alertness: Awake/alert Behavior During Therapy: WFL for tasks assessed/performed Overall Cognitive Status: Within Functional Limits for tasks assessed  Blood pressure 146/70, pulse 94, temperature 98.3 F (36.8 C), temperature source Oral, resp. rate 23, height 5\' 10"  (1.778 m), weight 81.8 kg (180 lb 5.4 oz), SpO2 95.00%.  Physical Exam  Nursing note and vitals reviewed. Constitutional: He is oriented to person, place, and time.  Thin elderly male  HENT:  Head: Normocephalic and atraumatic.  Eyes: Pupils are equal, round, and reactive to light.  Neck: Normal range of motion.  Cardiovascular: Regular rhythm.  Tachycardia present.   Pulmonary/Chest: Effort normal. No  respiratory distress. He has no wheezes. He has rhonchi in the left middle field and the left lower field.  Abdominal: Soft. Bowel sounds are normal. He exhibits no distension.  Musculoskeletal: He exhibits no edema and no tenderness.  Neurological: He is alert and oriented to person, place, and time.  Dysphonic with wet voice. Follows commands without difficulty.   Skin: Skin is warm and dry.  Psychiatric: He has a normal mood and affect. His behavior is normal. Judgment and thought content normal.   nasal cannula with O2 Motor strength 5/5 in bilateral deltoid, biceps, triceps, grip 4 minus/5 in bilateral hip flexors knee extensors ankle dorsiflexors and plantar flexors Sensory is intact to light touch in bilateral upper and lower limbs Cerebellar normal finger-nose-finger testing Extraocular movements are intact no evidence of nystagmus  Results for orders placed during the hospital encounter of  02/08/13 (from the past 24 hour(s))  GLUCOSE, CAPILLARY     Status: Abnormal   Collection Time    02/16/13 12:01 PM      Result Value Range   Glucose-Capillary 133 (*) 70 - 99 mg/dL  GLUCOSE, CAPILLARY     Status: Abnormal   Collection Time    02/16/13  4:00 PM      Result Value Range   Glucose-Capillary 127 (*) 70 - 99 mg/dL   Comment 1 Documented in Chart     Comment 2 Notify RN    GLUCOSE, CAPILLARY     Status: Abnormal   Collection Time    02/16/13  7:18 PM      Result Value Range   Glucose-Capillary 117 (*) 70 - 99 mg/dL   Comment 1 Documented in Chart     Comment 2 Notify RN    GLUCOSE, CAPILLARY     Status: Abnormal   Collection Time    02/17/13 12:13 AM      Result Value Range   Glucose-Capillary 114 (*) 70 - 99 mg/dL   Comment 1 Notify RN    COMPREHENSIVE METABOLIC PANEL     Status: Abnormal   Collection Time    02/17/13  4:15 AM      Result Value Range   Sodium 143  135 - 145 mEq/L   Potassium 3.5  3.5 - 5.1 mEq/L   Chloride 103  96 - 112 mEq/L   CO2 33 (*) 19 - 32 mEq/L   Glucose, Bld 115 (*) 70 - 99 mg/dL   BUN 33 (*) 6 - 23 mg/dL   Creatinine, Ser 9.14  0.50 - 1.35 mg/dL   Calcium 9.3  8.4 - 78.2 mg/dL   Total Protein 6.9  6.0 - 8.3 g/dL   Albumin 2.3 (*) 3.5 - 5.2 g/dL   AST 956 (*) 0 - 37 U/L   ALT 102 (*) 0 - 53 U/L   Alkaline Phosphatase 84  39 - 117 U/L   Total Bilirubin 0.8  0.3 - 1.2 mg/dL   GFR calc non Af Amer 79 (*) >90 mL/min   GFR calc Af Amer >90  >90 mL/min  MAGNESIUM     Status: None   Collection Time    02/17/13  4:15 AM      Result Value Range   Magnesium 2.5  1.5 - 2.5 mg/dL  PHOSPHORUS     Status: None   Collection Time    02/17/13  4:15 AM      Result Value Range   Phosphorus 4.0  2.3 - 4.6 mg/dL  CBC  Status: Abnormal   Collection Time    02/17/13  4:15 AM      Result Value Range   WBC 8.7  4.0 - 10.5 K/uL   RBC 4.74  4.22 - 5.81 MIL/uL   Hemoglobin 12.9 (*) 13.0 - 17.0 g/dL   HCT 16.1  09.6 - 04.5 %   MCV 88.6  78.0  - 100.0 fL   MCH 27.2  26.0 - 34.0 pg   MCHC 30.7  30.0 - 36.0 g/dL   RDW 40.9 (*) 81.1 - 91.4 %   Platelets 358  150 - 400 K/uL  GLUCOSE, CAPILLARY     Status: Abnormal   Collection Time    02/17/13  4:16 AM      Result Value Range   Glucose-Capillary 117 (*) 70 - 99 mg/dL   Comment 1 Notify RN    GLUCOSE, CAPILLARY     Status: Abnormal   Collection Time    02/17/13  7:55 AM      Result Value Range   Glucose-Capillary 120 (*) 70 - 99 mg/dL   Comment 1 Documented in Chart     Comment 2 Notify RN     Dg Sniff Test  02/15/2013   *RADIOLOGY REPORT*  Clinical Data: Shortness of breath when the patient is supine.  SNIFF TEST  Fluoroscopy time:  1 minute, 18 seconds  Comparison: Chest x-ray 02/15/2013  Findings: There is elevation of the right hemidiaphragm, similar in appearance to chest x-ray.  Medial right lower lobe opacity is also noted.  With multiple maneuvers, movement of the hemidiaphragms is noted to be symmetric. No paradoxical motion of the diaphragm noted.  IMPRESSION:  1.  Elevation of the right hemidiaphragm. 2.  No evidence for paralysis or paradoxical motion of the diaphragm.   Original Report Authenticated By: Norva Pavlov, M.D.   Dg Chest Port 1 View  02/16/2013   *RADIOLOGY REPORT*  Clinical Data: Congestive heart failure, pneumonia  PORTABLE CHEST - 1 VIEW  Comparison: 02/15/2013  Findings: Cardiomediastinal silhouette is stable.  Right IJ central line is unchanged in position.  There is poor inspiration with right basilar atelectasis or infiltrate.  Minimal left basilar atelectasis.  No pulmonary edema.  IMPRESSION: Poor inspiration with right basilar atelectasis or infiltrate. Mild left basilar atelectasis.  No pulmonary edema.  Stable right IJ central line position.   Original Report Authenticated By: Natasha Mead, M.D.    Assessment/Plan: Diagnosis: Deconditioning after that depended respiratory failure, with severe dysphagia 1. Does the need for close, 24 hr/day medical  supervision in concert with the patient's rehab needs make it unreasonable for this patient to be served in a less intensive setting? Yes 2. Co-Morbidities requiring supervision/potential complications: High risk for aspiration pneumonia, malnutrition, severe reflux history of recent fundoplication 3. Due to bladder management, bowel management, safety, skin/wound care, disease management, medication administration, pain management and patient education, does the patient require 24 hr/day rehab nursing? Yes 4. Does the patient require coordinated care of a physician, rehab nurse, PT (1-2 hrs/day, 5 days/week), OT (1-2 hrs/day, 5 days/week) and SLP (0.5-1 hrs/day, 5 days/week) to address physical and functional deficits in the context of the above medical diagnosis(es)? Yes Addressing deficits in the following areas: balance, endurance, locomotion, strength, transferring, bowel/bladder control, bathing, dressing, feeding, grooming and toileting 5. Can the patient actively participate in an intensive therapy program of at least 3 hrs of therapy per day at least 5 days per week? Yes 6. The potential for patient to  make measurable gains while on inpatient rehab is good 7. Anticipated functional outcomes upon discharge from inpatient rehab are supervision mobility with PT, supervision ADLs with OT, safe by mouth intake of modified diet with SLP. 8. Estimated rehab length of stay to reach the above functional goals is: 2 weeks 9. Does the patient have adequate social supports to accommodate these discharge functional goals? Potentially 10. Anticipated D/C setting: Home 11. Anticipated post D/C treatments: HH therapy 12. Overall Rehab/Functional Prognosis: good  RECOMMENDATIONS: This patient's condition is appropriate for continued rehabilitative care in the following setting: CIR once the patient has his swallowing reevaluated. Would recommend PEG tube placed prior to CIR depending on swallow  reevaluation. Patient has agreed to participate in recommended program. Yes Note that insurance prior authorization may be required for reimbursement for recommended care.  Comment: Patient's surgeon reportedly told him that he'll need to stay in the ICU setting for several more days    02/17/2013

## 2013-02-17 NOTE — Progress Notes (Signed)
9 Days Post-Op  Subjective: Feels better today.  Still has some coughing.  He is not always able to feel when he is swallowing.  Objective: Vital signs in last 24 hours: Temp:  [97.9 F (36.6 C)-98.4 F (36.9 C)] 98.3 F (36.8 C) (06/05 0800) Pulse Rate:  [88-109] 94 (06/05 0800) Resp:  [18-32] 23 (06/05 0800) BP: (117-146)/(60-95) 146/70 mmHg (06/05 0400) SpO2:  [93 %-98 %] 95 % (06/05 0800) FiO2 (%):  [40 %] 40 % (06/05 0223) Weight:  [180 lb 5.4 oz (81.8 kg)] 180 lb 5.4 oz (81.8 kg) (06/05 0500) Last BM Date: 02/15/13  Intake/Output from previous day: 06/04 0701 - 06/05 0700 In: 2939 [I.V.:460; IV Piggyback:450; TPN:2029] Out: 3421 [Urine:3420; Stool:1] Intake/Output this shift: Total I/O In: 113 [I.V.:20; TPN:93] Out: 500 [Urine:500]  PE: General- In NAD.  Nasal cannula on Abdomen-soft, incisions clean and intact  Lab Results:   Recent Labs  02/16/13 0553 02/17/13 0415  WBC 8.4 8.7  HGB 12.6* 12.9*  HCT 40.3 42.0  PLT 336 358   BMET  Recent Labs  02/16/13 0553 02/17/13 0415  NA 142 143  K 3.4* 3.5  CL 103 103  CO2 34* 33*  GLUCOSE 146* 115*  BUN 28* 33*  CREATININE 0.82 0.84  CALCIUM 9.0 9.3   PT/INR No results found for this basename: LABPROT, INR,  in the last 72 hours Comprehensive Metabolic Panel:    Component Value Date/Time   NA 143 02/17/2013 0415   K 3.5 02/17/2013 0415   CL 103 02/17/2013 0415   CO2 33* 02/17/2013 0415   BUN 33* 02/17/2013 0415   CREATININE 0.84 02/17/2013 0415   GLUCOSE 115* 02/17/2013 0415   CALCIUM 9.3 02/17/2013 0415   AST 121* 02/17/2013 0415   ALT 102* 02/17/2013 0415   ALKPHOS 84 02/17/2013 0415   BILITOT 0.8 02/17/2013 0415   PROT 6.9 02/17/2013 0415   ALBUMIN 2.3* 02/17/2013 0415     Studies/Results: Dg Sniff Test  02/15/2013   *RADIOLOGY REPORT*  Clinical Data: Shortness of breath when the patient is supine.  SNIFF TEST  Fluoroscopy time:  1 minute, 18 seconds  Comparison: Chest x-ray 02/15/2013  Findings: There is elevation  of the right hemidiaphragm, similar in appearance to chest x-ray.  Medial right lower lobe opacity is also noted.  With multiple maneuvers, movement of the hemidiaphragms is noted to be symmetric. No paradoxical motion of the diaphragm noted.  IMPRESSION:  1.  Elevation of the right hemidiaphragm. 2.  No evidence for paralysis or paradoxical motion of the diaphragm.   Original Report Authenticated By: Norva Pavlov, M.D.   Dg Chest Port 1 View  02/16/2013   *RADIOLOGY REPORT*  Clinical Data: Congestive heart failure, pneumonia  PORTABLE CHEST - 1 VIEW  Comparison: 02/15/2013  Findings: Cardiomediastinal silhouette is stable.  Right IJ central line is unchanged in position.  There is poor inspiration with right basilar atelectasis or infiltrate.  Minimal left basilar atelectasis.  No pulmonary edema.  IMPRESSION: Poor inspiration with right basilar atelectasis or infiltrate. Mild left basilar atelectasis.  No pulmonary edema.  Stable right IJ central line position.   Original Report Authenticated By: Natasha Mead, M.D.    Anti-infectives: Anti-infectives   Start     Dose/Rate Route Frequency Ordered Stop   02/14/13 1400  cefTRIAXone (ROCEPHIN) 2 g in dextrose 5 % 50 mL IVPB     2 g 100 mL/hr over 30 Minutes Intravenous Every 24 hours 02/14/13 1143  02/12/13 1000  vancomycin (VANCOCIN) IVPB 1000 mg/200 mL premix  Status:  Discontinued     1,000 mg 200 mL/hr over 60 Minutes Intravenous Every 12 hours 02/12/13 0943 02/14/13 1137   02/11/13 2300  piperacillin-tazobactam (ZOSYN) IVPB 3.375 g  Status:  Discontinued     3.375 g 12.5 mL/hr over 240 Minutes Intravenous Every 8 hours 02/11/13 2156 02/14/13 1144   02/11/13 2300  vancomycin (VANCOCIN) IVPB 750 mg/150 ml premix  Status:  Discontinued     750 mg 150 mL/hr over 60 Minutes Intravenous Every 12 hours 02/11/13 2156 02/12/13 0943   02/11/13 2230  micafungin (MYCAMINE) 100 mg in sodium chloride 0.9 % 100 mL IVPB  Status:  Discontinued     100  mg 100 mL/hr over 1 Hours Intravenous Daily at bedtime 02/11/13 2137 02/13/13 1023   02/08/13 1700  ceFAZolin (ANCEF) IVPB 1 g/50 mL premix     1 g 100 mL/hr over 30 Minutes Intravenous Every 6 hours 02/08/13 1639 02/09/13 0539   02/08/13 0650  ceFAZolin (ANCEF) IVPB 2 g/50 mL premix     2 g 100 mL/hr over 30 Minutes Intravenous On call to O.R. 02/08/13 0650 02/08/13 0957      Assessment Principal Problem:   Hiatal hernia-recurrent s/p laparoscopic repair and Nissen fundoplication on 02/08/13:  Wounds healing well Active Problems:   Aspiration pneumonia-on abxs   Bell's palsy   Atrial fibrillation-resolved.   Acute respiratory failure-resolved   Protein-calorie malnutrition, severe-on TPN.   Aspiration into airway-this is still a significant concern and I am not sure that he is not intermittently aspirating his saliva   Severe deconditioned state-Has been seen by PT   Chronic elevation of right hemidiaphragm-sniff test negative    LOS: 9 days   Plan:  Continue TPN for nutrition. I do not want to do a swallow study today.  He he is without incident for the next 24 hours then I feel it can be done tomorrow.  He needs to be upright (90 degrees) when he is swallowing and stay that way for at least 60 minutes.  Will have OT see and send a consult to CIR. Loletta Harper J 02/17/2013

## 2013-02-17 NOTE — Progress Notes (Signed)
PULMONARY  / CRITICAL CARE MEDICINE  Name: Victor Sutton MRN: [id number] DOB: [birth year]    ADMISSION DATE:  02/08/2013 CONSULTATION DATE:  02/08/13  REFERRING MD :  Okey Dupre PRIMARY SERVICE: Surgery  CHIEF COMPLAINT:  S/P hiatal hernia repair  BRIEF PATIENT DESCRIPTION: 79 year old male s/p open repair of hiatal hernia 02/08/13 admitted following respiratory failure and A-fib with RVR in PACU.  LINES / TUBES: 5/27 ETT>>> 5/28 5/30 ETT >> 6/01 R IJ CVL 5/30 >>   CULTURES: BAL 5/30 >> 30 k serratia Blood 5/30 >>  ANTIBIOTICS: Micafungin 5/30 >> 6/01 Vanc 5/30 >> 6/02 Zosyn 5/30 >> 6/02 Rocephin 6/02 >>    SIGNIFICANT EVENTS / STUDIES:  5/27 S/P open repair of hiatal hernia 5/29 Gastrograffin swallow study > aspirated 5/30 Intubated for respiratory distress, bronchoscopy 6/03 SNIFF test negative  SUBJECTIVE: Patient denies chest pain, palpitations, lightheadedness, dizziness, cough, abdominal pain.  Patient reports continued mild congestion.  Had difficulty sleeping last night due to BiPap machine.  Reports only wearing it for 4 hrs.  VITAL SIGNS: Temp:  [97.9 F (36.6 C)-98.4 F (36.9 C)] 98.3 F (36.8 C) (06/05 0800) Pulse Rate:  [88-109] 94 (06/05 0800) Resp:  [18-32] 23 (06/05 0800) BP: (117-146)/(60-95) 146/70 mmHg (06/05 0400) SpO2:  [93 %-98 %] 95 % (06/05 0800) FiO2 (%):  [40 %] 40 % (06/05 0223) Weight:  [81.8 kg (180 lb 5.4 oz)] 81.8 kg (180 lb 5.4 oz) (06/05 0500) 3 liters [place]  INTAKE / OUTPUT: Intake/Output     06/04 0701 - 06/05 0700 06/05 0701 - 06/06 0700   I.V. (mL/kg) 460 (5.6) 20 (0.2)   IV Piggyback 450    TPN 2029 93   Total Intake(mL/kg) 2939 (35.9) 113 (1.4)   Urine (mL/kg/hr) 3420 (1.7) 500 (3.9)   Stool 1 (0)    Total Output 3421 500   Net -482 -387        Stool Occurrence 1 x      PHYSICAL EXAMINATION: General: NAD, lying in bed Neuro: A&Ox 4.  Appropriate conversational skills, and can respond to commands.  CN II-XII grossly  intact.  Still mild assymetry noted on smile.  Sensation to light touch grossly intact.  Appropriate motor strength noted. HEENT:  PERLA, EOM intact, oral mucosa moist, no evidence of erythema or exudate in oropharynx. Cardiovascular:  RRR, no appreciable murmurs rubs or gallops. Lungs: No accessory muscle usage or retractions noted on inspection, effort normal on 4 L [place], occasional mild rhonchi noted in lung bases on ausculation Abdomen:  Hypoactive bowel sounds, abdomen tympanetic on percussion, soft and nontender to palpation.  Musculoskeletal:  Grip strength 5/5 bilaterally, plantar flexion and dorsiflexion 5/5 bilaterally. Skin:  No noted erythema or rashes on exam   LABS:  BMET    Component Value Date/Time   NA 143 02/17/2013 0415   K 3.5 02/17/2013 0415   CL 103 02/17/2013 0415   CO2 33* 02/17/2013 0415   GLUCOSE 115* 02/17/2013 0415   BUN 33* 02/17/2013 0415   CREATININE 0.84 02/17/2013 0415   CALCIUM 9.3 02/17/2013 0415   GFRNONAA 79* 02/17/2013 0415   GFRAA >90 02/17/2013 0415    CBC    Component Value Date/Time   WBC 8.7 02/17/2013 0415   RBC 4.74 02/17/2013 0415   HGB 12.9* 02/17/2013 0415   HCT 42.0 02/17/2013 0415   PLT 358 02/17/2013 0415   MCV 88.6 02/17/2013 0415   MCH 27.2 02/17/2013 0415   MCHC 30.7 02/17/2013 0415  RDW 17.4* 02/17/2013 0415   LYMPHSABS 1.0 02/14/2013 0530   MONOABS 0.9 02/14/2013 0530   EOSABS 0.0 02/14/2013 0530   BASOSABS 0.0 02/14/2013 0530    CXR: Dg Sniff Test  02/15/2013   *RADIOLOGY REPORT*  Clinical Data: Shortness of breath when the patient is supine.  SNIFF TEST  Fluoroscopy time:  1 minute, 18 seconds  Comparison: Chest x-ray 02/15/2013  Findings: There is elevation of the right hemidiaphragm, similar in appearance to chest x-ray.  Medial right lower lobe opacity is also noted.  With multiple maneuvers, movement of the hemidiaphragms is noted to be symmetric. No paradoxical motion of the diaphragm noted.  IMPRESSION:  1.  Elevation of the right hemidiaphragm. 2.  No  evidence for paralysis or paradoxical motion of the diaphragm.   Original Report Authenticated By: Norva Pavlov, M.D.   Dg Chest Port 1 View  02/16/2013   *RADIOLOGY REPORT*  Clinical Data: Congestive heart failure, pneumonia  PORTABLE CHEST - 1 VIEW  Comparison: 02/15/2013  Findings: Cardiomediastinal silhouette is stable.  Right IJ central line is unchanged in position.  There is poor inspiration with right basilar atelectasis or infiltrate.  Minimal left basilar atelectasis.  No pulmonary edema.  IMPRESSION: Poor inspiration with right basilar atelectasis or infiltrate. Mild left basilar atelectasis.  No pulmonary edema.  Stable right IJ central line position.   Original Report Authenticated By: Natasha Mead, M.D.   CBG (last 3)   Recent Labs  02/16/13 1918 02/17/13 0416 02/17/13 0755  GLUCAP 117* 117* 120*      ASSESSMENT / PLAN:  PULMONARY A: Acute post op respiratory failure s/p repair of hiatal hernia. 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 P:  -Monitor on tele -Lasix bid on 6/05 >> likely change to qdaily on 6/06 with goal even to slight negative fluid balance -prn lopressor, hydralazine for elevated blood pressure  RENAL A: Hypokalemia. P:   -f/u bmet and replace electrolytes as needed  GASTROINTESTINAL A:  S/P repair of hiatal hernia and Nissen Fundoplication 02/10/13. Dysphagia. P:   -Continue TPN per CCS -CCS recommends doing bedside swallow with close administration at 90 degrees, but defer until 6/06  HEMATOLOGIC A:  No active issues P:  -Monitor -SQ heparin for DVT prevention  INFECTIOUS A:  Aspiration PNA/pneumonitis >> Serratia in sputum. P:   -D6/x Abx, currently on rocephin.  Consider  discontinuing antibiotic after Day 7.  ENDOCRINE A: Hyperglycemia on TPN  P:   -Continue SSI  NEUROLOGIC A: Recent hx of Bell's palsy.  Anxiety. P:   -monitor neurologic exam -prn klonopin, fentanyl  Carley Hammed Physician Assistant Student 02/17/13, 8:53  Reviewed above, examined pt, and agree with assessment/plan.  Respiratory status continues to improve, and with this his anxiety level is better.  Will continue prn klonopin.  Change BiPAP to prn only.  Adjust oxygen to keep SpO2 > 92%.  Continue to optimize nutritional status >> hopefully will be able to do swallow eval 6/06.  Continue PT >> being assessed for CIR.  If he remains stable off BiPAP, then likely can transfer out of SDU in next 24 to 48 hours.  Coralyn Helling, MD [facility] Pulmonary/Critical Care 02/17/2013, 11:00 AM Pager:  [phone] After 3pm call: [phone]

## 2013-02-17 NOTE — Progress Notes (Signed)
PARENTERAL NUTRITION CONSULT NOTE - FOLLOW UP  Pharmacy Consult for TPN Indication: dysphagia with Bell's Palsy s/p hernia repair/Nissen fundoplication  No Known Allergies  Patient Measurements: Height: 5\' 10"  (177.8 cm) Weight: 180 lb 5.4 oz (81.8 kg) IBW/kg (Calculated) : 73  Vital Signs: Temp: 98.3 F (36.8 C) (06/05 0400) Temp src: Oral (06/05 0400) BP: 146/70 mmHg (06/05 0400) Pulse Rate: 91 (06/05 0400) Intake/Output from previous day: 06/04 0701 - 06/05 0700 In: 2826 [I.V.:440; IV Piggyback:450; TPN:1936] Out: 3421 [Urine:3420; Stool:1]  Labs:  Recent Labs  02/15/13 0706 02/16/13 0553 02/17/13 0415  WBC 11.0* 8.4 8.7  HGB 13.4 12.6* 12.9*  HCT 39.4 40.3 42.0  PLT 318 336 358     Recent Labs  02/15/13 0706 02/16/13 0553 02/17/13 0415  NA 143 142 143  K 3.5 3.4* 3.5  CL 106 103 103  CO2 30 34* 33*  GLUCOSE 130* 146* 115*  BUN 21 28* 33*  CREATININE 0.79 0.82 0.84  CALCIUM 9.1 9.0 9.3  MG 2.3 2.4 2.5  PHOS 3.4 3.6 4.0  PROT  --  6.6 6.9  ALBUMIN  --  2.2* 2.3*  AST  --  109* 121*  ALT  --  77* 102*  ALKPHOS  --  73 84  BILITOT  --  0.9 0.8  Corr Ca = 10.46 (6/5) Estimated Creatinine Clearance: 70 ml/min (by C-G formula based on Cr of 0.84).    Recent Labs  02/16/13 1201 02/16/13 1600 02/16/13 1918  GLUCAP 133* 127* 117*    Medications:  Scheduled:  . albuterol  2.5 mg Nebulization Q6H  . antiseptic oral rinse  15 mL Mouth Rinse QID  . cefTRIAXone (ROCEPHIN)  IV  2 g Intravenous Q24H  . chlorhexidine  15 mL Mouth Rinse BID  . furosemide  40 mg Intravenous Q12H  . heparin subcutaneous  5,000 Units Subcutaneous Q8H  . insulin aspart  0-9 Units Subcutaneous Q4H  . pantoprazole (PROTONIX) IV  40 mg Intravenous QHS  . potassium chloride  10 mEq Intravenous Q1 Hr x 2   Infusions:  . sodium chloride 20 mL/hr at 02/14/13 1400  . Marland KitchenTPN (CLINIMIX-E) Adult 83 mL/hr at 02/16/13 1717   And  . fat emulsion 240 mL (02/16/13 1717)     Nutritional Goals:   RD recs (5/30): 1980-2310 Kcal, 110-128 g Protein, 2.5 L fluid  Clinimix 5/20 at 83 ml/hr + Lipids MWF to provide 100 g protein/day and an average of  1959 Kcal/day.   Current nutrition:   Diet: NPO  IVF: NS at [facility]  Clinimix E 5/20 at 83 ml/hr  CBGs & Insulin requirements past 24 hours:   CBGs ordered q4h:  Within goal of < 150 mg/dl  Sensitive SSI :  3 ZOXWR/60A  Assessment:  79 yo M admit 5/27 s/p laparoscopic hernia repair and Nissen fundoplication on 5/27.  The pt had aspiration during UGI swallow evaluation on 5/29.  He has a history of poor PO intake prior to admission and has severe malnutrition.  IV team unable to place PICC; CCM to place central line on 5/30.  6/5:  Day #7 TPN.  Extubated 5/1. TPN at goal and tolerating thus far, AST/ALT trending up.   Renal function:  SCr is stable/WNL, excellent UOP with neg fluid balance (on lasix 40mg  IV q12)  Hepatic function: AST/ALT elevated, Tbili elevated improved to WNL  Electrolytes: K remains low w/ daily replacement.   Pre-Albumin: 8.9 (5/31), 9.4 (6/2)  TG/Cholesterol: WNL (5/31), 106 (  6/2)  Glucose:  Within goal range (< 150 mg/dl)  Plan:  Continue Clinimix E 5/20 to goal 83 ml/hr tonight  Fat emulsion at 10 ml/hr (MWF only due to ongoing shortage). Replaced K+ with KCl with 4 runs KCl Watch Mg/Phos level as trending up and approaching ULN - may need to remove lytes from TPN TNA to contain standard multivitamins and trace elements (MWF only due to ongoing shortage). Continue sensitive SSI q4h (CBGs q4h) TNA lab panels on Mondays & Thursdays.  Loralee Pacas, PharmD, BCPS Pager: [phone]  02/17/2013 7:19 AM

## 2013-02-18 ENCOUNTER — Encounter (HOSPITAL_COMMUNITY): Payer: Self-pay | Admitting: Surgery

## 2013-02-18 LAB — GLUCOSE, CAPILLARY
Glucose-Capillary: 115 mg/dL — ABNORMAL HIGH (ref 70–99)
Glucose-Capillary: 119 mg/dL — ABNORMAL HIGH (ref 70–99)
Glucose-Capillary: 126 mg/dL — ABNORMAL HIGH (ref 70–99)
Glucose-Capillary: 127 mg/dL — ABNORMAL HIGH (ref 70–99)
Glucose-Capillary: 137 mg/dL — ABNORMAL HIGH (ref 70–99)
Glucose-Capillary: 139 mg/dL — ABNORMAL HIGH (ref 70–99)

## 2013-02-18 LAB — COMPREHENSIVE METABOLIC PANEL
ALT: 112 U/L — ABNORMAL HIGH (ref 0–53)
AST: 113 U/L — ABNORMAL HIGH (ref 0–37)
Albumin: 2.3 g/dL — ABNORMAL LOW (ref 3.5–5.2)
Alkaline Phosphatase: 91 U/L (ref 39–117)
BUN: 36 mg/dL — ABNORMAL HIGH (ref 6–23)
CO2: 35 mEq/L — ABNORMAL HIGH (ref 19–32)
Calcium: 9.4 mg/dL (ref 8.4–10.5)
Chloride: 102 mEq/L (ref 96–112)
Creatinine, Ser: 0.89 mg/dL (ref 0.50–1.35)
GFR calc Af Amer: 90 mL/min — ABNORMAL LOW (ref >90)
GFR calc non Af Amer: 77 mL/min — ABNORMAL LOW (ref >90)
Glucose, Bld: 179 mg/dL — ABNORMAL HIGH (ref 70–99)
Potassium: 3.6 mEq/L (ref 3.5–5.1)
Sodium: 141 mEq/L (ref 135–145)
Total Bilirubin: 0.8 mg/dL (ref 0.3–1.2)
Total Protein: 6.8 g/dL (ref 6.0–8.3)

## 2013-02-18 LAB — CULTURE, BLOOD (ROUTINE X 2)
Culture: NO GROWTH
Culture: NO GROWTH

## 2013-02-18 LAB — PHOSPHORUS: Phosphorus: 4 mg/dL (ref 2.3–4.6)

## 2013-02-18 LAB — MAGNESIUM: Magnesium: 2.5 mg/dL (ref 1.5–2.5)

## 2013-02-18 MED ORDER — TRACE MINERALS CR-CU-F-FE-I-MN-MO-SE-ZN IV SOLN
INTRAVENOUS | Status: AC
  Administered 2013-02-18: 18:00:00 via INTRAVENOUS
  Filled 2013-02-18: qty 2000

## 2013-02-18 MED ORDER — FAT EMULSION 20 % IV EMUL
250.0000 mL | INTRAVENOUS | Status: AC
  Administered 2013-02-18: 250 mL via INTRAVENOUS
  Filled 2013-02-18: qty 250

## 2013-02-18 MED ORDER — FUROSEMIDE 10 MG/ML IJ SOLN
40.0000 mg | Freq: Every day | INTRAMUSCULAR | Status: DC
  Administered 2013-02-19 – 2013-02-22 (×4): 40 mg via INTRAVENOUS
  Filled 2013-02-18 (×5): qty 4

## 2013-02-18 MED ORDER — POTASSIUM CHLORIDE 10 MEQ/100ML IV SOLN
10.0000 meq | INTRAVENOUS | Status: AC
  Administered 2013-02-18 (×2): 10 meq via INTRAVENOUS
  Filled 2013-02-18 (×2): qty 100

## 2013-02-18 NOTE — Progress Notes (Signed)
OT Cancellation Note  Patient Details Name: Victor Sutton MRN: [id number] DOB: 1928-04-05   Cancelled Treatment:    Reason Eval/Treat Not Completed: Other (comment) Pt is scheduled for MBS and esophagram this pm.  Wants to wait on OT/PT until after these tests.  Will check back another time.    Raine Blodgett 02/18/2013, 1:17 PM Marica Otter, OTR/L [phone] 02/18/2013

## 2013-02-18 NOTE — Progress Notes (Signed)
PT Cancellation Note  Patient Details Name: Victor Sutton MRN: [id number] DOB: [birth year]   Cancelled Treatment:    Reason Eval/Treat Not Completed: Patient at procedure or test/unavailablept did not want to exhaust himself before MBS/ will check back tomorrow as schedule allows.   Rada Hay 02/18/2013, 12:59 PM Blanchard Kelch PT [phone]

## 2013-02-18 NOTE — Progress Notes (Signed)
10 Days Post-Op  Subjective: Feeling okay.  No pain.  Decreasing oxygen requirements and breathing well.  Objective: Vital signs in last 24 hours: Temp:  [97.7 F (36.5 C)-98.8 F (37.1 C)] 97.8 F (36.6 C) (06/06 0800) Pulse Rate:  [95-107] 95 (06/06 0050) Resp:  [14-25] 21 (06/06 0500) BP: (124-149)/(71-84) 148/84 mmHg (06/06 0500) SpO2:  [94 %-98 %] 96 % (06/06 0759) Weight:  [182 lb 5.1 oz (82.7 kg)] 182 lb 5.1 oz (82.7 kg) (06/06 0500) Last BM Date: 02/15/13  Intake/Output from previous day: 06/05 0701 - 06/06 0700 In: 2559 [I.V.:440; IV Piggyback:100; TPN:2019] Out: 2160 [Urine:2160] Intake/Output this shift:    General appearance: alert, cooperative and no distress Resp: lungs sound clear, 1L [place] Cardio: normal rate, regular GI: soft, NT, ND, wounds without infection  Lab Results:   Recent Labs  02/16/13 0553 02/17/13 0415  WBC 8.4 8.7  HGB 12.6* 12.9*  HCT 40.3 42.0  PLT 336 358   BMET  Recent Labs  02/17/13 0415 02/18/13 0500  NA 143 141  K 3.5 3.6  CL 103 102  CO2 33* 35*  GLUCOSE 115* 179*  BUN 33* 36*  CREATININE 0.84 0.89  CALCIUM 9.3 9.4   PT/INR No results found for this basename: LABPROT, INR,  in the last 72 hours ABG No results found for this basename: PHART, PCO2, PO2, HCO3,  in the last 72 hours  Studies/Results: No results found.  Anti-infectives: Anti-infectives   Start     Dose/Rate Route Frequency Ordered Stop   02/14/13 1400  cefTRIAXone (ROCEPHIN) 2 g in dextrose 5 % 50 mL IVPB     2 g 100 mL/hr over 30 Minutes Intravenous Every 24 hours 02/14/13 1143     02/12/13 1000  vancomycin (VANCOCIN) IVPB 1000 mg/200 mL premix  Status:  Discontinued     1,000 mg 200 mL/hr over 60 Minutes Intravenous Every 12 hours 02/12/13 0943 02/14/13 1137   02/11/13 2300  piperacillin-tazobactam (ZOSYN) IVPB 3.375 g  Status:  Discontinued     3.375 g 12.5 mL/hr over 240 Minutes Intravenous Every 8 hours 02/11/13 2156 02/14/13 1144   02/11/13 2300  vancomycin (VANCOCIN) IVPB 750 mg/150 ml premix  Status:  Discontinued     750 mg 150 mL/hr over 60 Minutes Intravenous Every 12 hours 02/11/13 2156 02/12/13 0943   02/11/13 2230  micafungin (MYCAMINE) 100 mg in sodium chloride 0.9 % 100 mL IVPB  Status:  Discontinued     100 mg 100 mL/hr over 1 Hours Intravenous Daily at bedtime 02/11/13 2137 02/13/13 1023   02/08/13 1700  ceFAZolin (ANCEF) IVPB 1 g/50 mL premix     1 g 100 mL/hr over 30 Minutes Intravenous Every 6 hours 02/08/13 1639 02/09/13 0539   02/08/13 0650  ceFAZolin (ANCEF) IVPB 2 g/50 mL premix     2 g 100 mL/hr over 30 Minutes Intravenous On call to O.R. 02/08/13 0650 02/08/13 0957      Assessment/Plan: s/p Procedure(s):  REPAIR OFCURRENT HIATAL HERNIA,  (N/A) UPPER GI ENDOSCOPY (N/A) LAPAROSCOPIC LYSIS OF ADHESIONS (N/A) LAPAROSCOPIC NISSEN FUNDOPLICATION continues to improve with pulmonary status.  barium swallow today and if okay, then we can likely wean TPN in order to get central line out.  LOS: 10 days    Lodema Pilot DAVID 02/18/2013

## 2013-02-18 NOTE — Progress Notes (Signed)
Order received for MBS, upon review of chart MD note indicated barium swallow (esophagram) to be completed.  SLP phoned RN to request clarification of order.    Would recommend to consider MBS (oropharyngeal evaluation) followed by barium swallow (esophageal evaluation) given pt aspirated on UGI study.     SLP to await call from RN with MD order clarification. Radiology aware.   Thanks.    [name], MS [facility] SLP [phone]

## 2013-02-18 NOTE — Progress Notes (Signed)
PARENTERAL NUTRITION CONSULT NOTE - FOLLOW UP  Pharmacy Consult for TPN Indication: dysphagia with Bell's Palsy, aspiration s/p hernia repair/Nissen fundoplication 5/27  No Known Allergies  Patient Measurements: Height: 5\' 10"  (177.8 cm) Weight: 182 lb 5.1 oz (82.7 kg) IBW/kg (Calculated) : 73  Vital Signs: Temp: 97.8 F (36.6 C) (06/06 0800) Temp src: Oral (06/06 0800) BP: 148/84 mmHg (06/06 0500) Pulse Rate: 95 (06/06 0050) Intake/Output from previous day: 06/05 0701 - 06/06 0700 In: 2559 [I.V.:440; IV Piggyback:100; TPN:2019] Out: 2160 [Urine:2160]  Labs:  Recent Labs  02/16/13 0553 02/17/13 0415  WBC 8.4 8.7  HGB 12.6* 12.9*  HCT 40.3 42.0  PLT 336 358    Recent Labs  02/16/13 0553 02/17/13 0415 02/18/13 0500  NA 142 143 141  K 3.4* 3.5 3.6  CL 103 103 102  CO2 34* 33* 35*  GLUCOSE 146* 115* 179*  BUN 28* 33* 36*  CREATININE 0.82 0.84 0.89  CALCIUM 9.0 9.3 9.4  MG 2.4 2.5 2.5  PHOS 3.6 4.0 4.0  PROT 6.6 6.9 6.8  ALBUMIN 2.2* 2.3* 2.3*  AST 109* 121* 113*  ALT 77* 102* 112*  ALKPHOS 73 84 91  BILITOT 0.9 0.8 0.8  Corr Ca = 10.46 (6/5) Estimated Creatinine Clearance: 66.1 ml/min (by C-G formula based on Cr of 0.89).    Recent Labs  02/18/13 0045 02/18/13 0413 02/18/13 0749  GLUCAP 139* 137* 127*   Medications:  Scheduled:  . albuterol  2.5 mg Nebulization Q6H  . antiseptic oral rinse  15 mL Mouth Rinse QID  . cefTRIAXone (ROCEPHIN)  IV  2 g Intravenous Q24H  . chlorhexidine  15 mL Mouth Rinse BID  . furosemide  40 mg Intravenous Q12H  . heparin subcutaneous  5,000 Units Subcutaneous Q8H  . insulin aspart  0-9 Units Subcutaneous Q4H  . pantoprazole (PROTONIX) IV  40 mg Intravenous QHS   Infusions:  . sodium chloride 20 mL/hr at 02/18/13 0353  . Marland KitchenTPN (CLINIMIX-E) Adult 83 mL/hr at 02/17/13 1723    Nutritional Goals:   RD recs (5/30): 1980-2310 Kcal, 110-128 g Protein, 2.5 L fluid  Clinimix 5/20 at 83 ml/hr + Lipids MWF to provide  100 g protein/day and an average of  1959 Kcal/day.   Current nutrition:   Diet: NPO  IVF: NS at [facility]  Clinimix E 5/20 at 83 ml/hr  Lipids 20% MWF  CBGs & Insulin requirements past 24 hours:   CBGs ordered q4h:  Within goal of < 150 mg/dl  Sensitive SSI :  4 ZOXWR/60A  Assessment:  79 yo M admit 5/27 s/p laparoscopic hernia repair and Nissen fundoplication on 5/27.  The pt had aspiration during UGI swallow evaluation on 5/29.  He has a history of poor PO intake prior to admission and has severe malnutrition (40lb wt loss PTA).  IV team unable to place PICC; CCM to place central line on 5/30 (R IJ), plan PICC if fails swallow study  6/6:  Day #8 TPN.  Extubated 5/1 (77). TPN at goal and tolerating thus far, AST/ALT slowly trending up.  Renal function:  SCr is stable/WNL, excellent UOP with neg fluid balance (on lasix 40mg  IV q12)  Hepatic function: AST/ALT elevated, Tbili was elevated, now improved to WNL  Electrolytes: K remains low w/ daily replacement.   Pre-Albumin: 8.9 (5/31), 9.4 (6/2)  TG/Cholesterol: WNL (5/31), 106 (6/2)  Glucose:  Within goal range (< 150 mg/dl)  Plan:  Continue Clinimix E 5/20 to goal 83 ml/hr tonight  Fat  emulsion at 10 ml/hr (MWF only due to ongoing shortage). Replace K+ with 2 runs KCl, BMET am Watching Mg/Phos level as trending up and approaching ULN - may need to remove lytes from TPN TNA to contain standard multivitamins and trace elements (MWF only due to ongoing shortage). Continue sensitive SSI q4h (CBGs q4h) TNA lab panels on Mondays & Thursdays. Swallow study planned for today, PEG tube if fails swallow study.   Otho Bellows PharmD Pager [phone] 02/18/2013, 8:40 AM

## 2013-02-18 NOTE — Progress Notes (Signed)
PULMONARY  / CRITICAL CARE MEDICINE  Name: Victor Sutton MRN: [id number] DOB: 20-Nov-1927    ADMISSION DATE:  02/08/2013 CONSULTATION DATE:  02/08/13  REFERRING MD :  Okey Dupre PRIMARY SERVICE: Surgery  CHIEF COMPLAINT:  S/P hiatal hernia repair  BRIEF PATIENT DESCRIPTION: 79 year old male s/p open repair of hiatal hernia 02/08/13 admitted following respiratory failure and A-fib with RVR in PACU.  LINES / TUBES: 5/27 ETT>>> 5/28 5/30 ETT >> 6/01 R IJ CVL 5/30 >>   CULTURES: BAL 5/30 >> 30 k serratia Blood 5/30 >>  ANTIBIOTICS: Micafungin 5/30 >> 6/01 Vanc 5/30 >> 6/02 Zosyn 5/30 >> 6/02 Rocephin 6/02 >>    SIGNIFICANT EVENTS / STUDIES:  5/27 S/P open repair of hiatal hernia 5/29 Gastrograffin swallow study > aspirated 5/30 Intubated for respiratory distress, bronchoscopy 6/03 SNIFF test negative  SUBJECTIVE: Patient denies chest pain, palpitations, lightheadedness, dizziness, cough, abdominal pain.  Patient reports sleeping very well last night, and did not use BiPap at all.  Reports still not being sure as to whether saliva is going down his esophagus or trachea.      VITAL SIGNS: Temp:  [97.7 F (36.5 C)-98.8 F (37.1 C)] 97.7 F (36.5 C) (06/06 0400) Pulse Rate:  [95-107] 95 (06/06 0050) Resp:  [14-25] 21 (06/06 0500) BP: (124-149)/(71-84) 148/84 mmHg (06/06 0500) SpO2:  [94 %-98 %] 96 % (06/06 0759) Weight:  [82.7 kg (182 lb 5.1 oz)] 82.7 kg (182 lb 5.1 oz) (06/06 0500) 3 liters [place]  INTAKE / OUTPUT: Intake/Output     06/05 0701 - 06/06 0700 06/06 0701 - 06/07 0700   I.V. (mL/kg) 440 (5.3)    IV Piggyback 100    TPN 2019    Total Intake(mL/kg) 2559 (30.9)    Urine (mL/kg/hr) 2160 (1.1)    Stool     Total Output 2160     Net +399            PHYSICAL EXAMINATION: General: NAD, lying in bed Neuro: A&Ox 4.  Appropriate conversational skills, and can respond to commands.  CN II-XII grossly intact.  Still mild assymetry noted on smile.  Sensation to light  touch grossly intact.  Appropriate motor strength noted. HEENT:  PERLA, EOM intact, oral mucosa moist, no evidence of erythema or exudate in oropharynx. Cardiovascular:  RRR, no appreciable murmurs rubs or gallops. Lungs: No accessory muscle usage or retractions noted on inspection, effort normal on 2 L , lungs clear to auscultation anteriorly and posteriorly. Abdomen:  Positive bowel sounds, abdomen tympanetic on percussion, soft and nontender to palpation.  Musculoskeletal:  Appropriate motor strength Skin:  No noted erythema or rashes on exam   LABS:  BMET    Component Value Date/Time   NA 141 02/18/2013 0500   K 3.6 02/18/2013 0500   CL 102 02/18/2013 0500   CO2 35* 02/18/2013 0500   GLUCOSE 179* 02/18/2013 0500   BUN 36* 02/18/2013 0500   CREATININE 0.89 02/18/2013 0500   CALCIUM 9.4 02/18/2013 0500   GFRNONAA 77* 02/18/2013 0500   GFRAA 90* 02/18/2013 0500    CBC    Component Value Date/Time   WBC 8.7 02/17/2013 0415   RBC 4.74 02/17/2013 0415   HGB 12.9* 02/17/2013 0415   HCT 42.0 02/17/2013 0415   PLT 358 02/17/2013 0415   MCV 88.6 02/17/2013 0415   MCH 27.2 02/17/2013 0415   MCHC 30.7 02/17/2013 0415   RDW 17.4* 02/17/2013 0415   LYMPHSABS 1.0 02/14/2013 0530   MONOABS  0.9 02/14/2013 0530   EOSABS 0.0 02/14/2013 0530   BASOSABS 0.0 02/14/2013 0530    CXR: No results found. CBG (last 3)   Recent Labs  02/18/13 0045 02/18/13 0413 02/18/13 0749  GLUCAP 139* 137* 127*      ASSESSMENT / PLAN:  PULMONARY A: Acute post op respiratory failure s/p repair of hiatal hernia. 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 P:  -Monitor on tele -Change lasix to 40 mg qdaily on 6/06 -prn  lopressor, hydralazine for elevated blood pressure  RENAL A: Hypokalemia. P:   -f/u bmet and replace electrolytes as needed will getting lasix  GASTROINTESTINAL A:  S/P repair of hiatal hernia and Nissen Fundoplication 02/10/13. Dysphagia. P:   -Continue TPN per CCS -Modified swallow study 6/06 >> if unable to swallow, then surgery may consider PEG placement next week   HEMATOLOGIC A:  No active issues P:  -Monitor -SQ heparin for DVT prevention  INFECTIOUS A:  Aspiration PNA/pneumonitis >> Serratia in sputum. P:   -D7/7 Abx, currently on rocephin.  Discontinue after today's dose.  ENDOCRINE A: Hyperglycemia on TPN  P:   -Continue SSI  NEUROLOGIC A: Recent hx of Bell's palsy.  Anxiety >> improved. P:   -monitor neurologic exam -prn klonopin, fentanyl  Today's Summary:  Patient continues to do well.  Acute respiratory failure post-op seems to be improving.  Patient no longer feels dyspneic.  Patient did not require use of Bipap last night 6/05 to 6/06.  Will discontinue use.  Aspiration pneumonia seems to be resolving with Rocephin.  Will discontinue Rocephin after today's dose.    Assuming today's modified swallow study goes well and there are no more respiratory events will consider transferring patient to stepdown care either this afternoon or tomorrow pending recommendation of CCS.    Carley Hammed Physician Assistant Student 02/18/13, 8:34 am  Reviewed above, examined pt, and agree with assessment/plan.  Much improved.  Able to lay in more recumbent position w/o sensation of reflux.  Did not need BiPAP >> will d/c.  Not needing klonopin.  For swallow evaluation today.  D/c rocephin after dose on 6/06.  Will need PFT's as outpt when more stable to further assess for obstructive lung disease with hx of smoking.    Keep CVL in for now while getting TNA.  Respiratory status stable for patient to transfer out of SDU >> defer to CCS.  PCCM can be available as  needed over weekend.  Will f/u on 6/09 >> call if help needed sooner.  Coralyn Helling, MD [facility] Pulmonary/Critical Care 02/18/2013, 10:24 AM Pager:  [phone] After 3pm call: [phone]

## 2013-02-18 NOTE — Evaluation (Addendum)
Clinical/Bedside Swallow Evaluation Patient Details  Name: Victor Sutton MRN: [id number] Date of Birth: 01-30-1928  Today's Date: 02/18/2013 Time: 1355-1445 SLP Time Calculation (min): 50 min  Past Medical History:  Past Medical History  Diagnosis Date  . BPH (benign prostatic hyperplasia)   . Macular degeneration     (L) wet, (R) dry  . B12 deficiency     HIstory  . 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 Leukoplakia 2001    Treated for  . Hypertension   . Iron deficiency anemia   . Cataract 2009    bilateral cataract surgery  . GERD (gastroesophageal reflux disease)   . Hyperlipidemia     not on medication  . Hiatal hernia   . Peripheral neuropathy     Mild History of  . Ulcer   . Bell's palsy     at present   Past Surgical History:  Past Surgical History  Procedure Laterality Date  . Cholecystectomy    . Arthroscopic knee surgery      left  . Multiple oral surgeries  2002  . Tonsillectomy    . Cateract surgeries       bilateral  . Stomach surgery  2005    states his stomach had moved up toward his chest , some type of surgery performed  . Hernia repair      Laparoscopic ventral  . Hiatal hernia repair  2004  . Skin cancer excision  10/2012    right leg-shin area  . Hiatal hernia repair N/A 02/08/2013    Procedure:  REPAIR OFCURRENT HIATAL HERNIA, ;  Surgeon: Adolph Pollack, MD;  Location: [facility];  Service: General;  Laterality: N/A;  . Upper gi endoscopy N/A 02/08/2013    Procedure: UPPER GI ENDOSCOPY;  Surgeon: Adolph Pollack, MD;  Location: [facility];  Service: General;  Laterality: N/A;  . Laparoscopic lysis of adhesions N/A 02/08/2013    Procedure: LAPAROSCOPIC LYSIS OF ADHESIONS;  Surgeon: Adolph Pollack, MD;  Location: [facility];  Service: General;  Laterality: N/A;  . Laparoscopic nissen fundoplication  02/08/2013    Procedure: LAPAROSCOPIC NISSEN FUNDOPLICATION;  Surgeon: Adolph Pollack, MD;  Location: [facility];  Service: General;;   HPI:  79 year old  male with a history of a recurrent hiatal hernia and reflux. He was taken to the OR 02/08/13 for an open repair of his hernia with a Nissen fundoplication. He tolerated the operation well and was take to PACU.  While in PACU he developed respiratory distress following extubation with A-fib and RVR. He received multiple agents to control his heart rhythm without success and was re-intubated.  Pt has been intubated 5/27-5/28.  He underwent a gastrograffin study 02/10/13 and aspirated during the procedure.  Pt developed respiratory problems and underwent bronch requring reintubation 5/30-6/1.  Pt underwent sniff test to evaluate diaphragm function, which was found to be negative.  Given hx of GI issues, aspiration during gastrograffin test and current Bell's Palsy, swallow eval was ordered.    Initially critical care service ordered at BSE and surgical MD had ordered MBS and barium swallow per SLP recommendations.  Pt was reticent re: participating in [facility] and had questions re: procedure and SLP conducted BSE in room to assure ready for instrumental evaluation.  Pt has h/o esophageal dysphagia but had recent onset of oral deficits characterized by difficulties forming food boluses and orally transiting since Bell's palsy (April 2014) per pt.   Pt's Bell's Palsy may impair  taste sensation to anterior lingual area and may impact salivary glands.    Assessment / Plan / Recommendation Clinical Impression  Pt presents with clinical indications of severe dysphagia (suspected pharyngeal and possibly esophageal) with concern for adequacy of airway protection even from secretions.  Pt presents with wet vocal quality and is expectorating secretions frequently throughout the day, although with less frequency than previously per pt.   Voice is hoarse, concerning for adequacy of vocal fold closure/airway protection.    SLP provided pt with single ice chips, 1/2 tsp of water and small cup sip of water.  Ice chips (melted)  appeared to be tolerated well with 1-2 swallows to clear.  However once pt consumed small cup sip of water, he required 5 swallows and demonstrated delayed productive cough - suspect overt aspiration.  Due to current level of dysphagia prohibiting pt from po intake, SLP determined to defer MBS until pt demonstrates clinical improvement.  Pt reports sensation of airway being "frozen open" and "food passage frozen closed".    Rec pt be allowed few small ice chips (that he allows to completely melt) to decrease disuse muscle atrophy.  SLP to follow up next date for reeval at bedside for MBS readiness.    Rec pt have MBS prior to PEG placement - which spouse states may be placed next week per her discussion with MD.  If pt's dysphagia does not resolve, ENT consult may be beneficial.      Aspiration Risk    Severe with po besides single ice chips.   Diet Recommendation Ice chips PRN after oral care;NPO (few ice chips pt allows to melt in oral cavity)        Other  Recommendations Oral Care Recommendations: Oral care QID Other Recommendations: Have oral suction available   Follow Up Recommendations  Inpatient Rehab    Frequency and Duration min 2x/week  2 weeks   Pertinent Vitals/Pain Afebrile, decreased    SLP Swallow Goals Goal #3: Pt will demonstrate ability to recognize wet vocalizations and indication to clear to maximize airway protection with mod I.  Goal #4: Pt will demonstrate ability to swallow small single bolus of water with 1-3 reflexive swallows each bolus to aid in determining readiness for instrumental evaluation.     Swallow Study Prior Functional Status   Recent dysphagia requiring pureed foods- end of April or first of May, was able to swallow liquids without difficulties PTA per pt and spouse.      General Date of Onset: 02/08/13 HPI: 79 year old male with a history of a recurrent hiatal hernia and reflux. He was taken to the OR 02/08/13 for an open repair of his hernia  with a Nissen fundoplication. He tolerated the operation well and was take to PACU.  While in PACU he developed respiratory distress following extubation with A-fib and RVR. He received multiple agents to control his heart rhythm without success and was re-intubated.  Pt has been intubated 5/27-5/28.  He underwent a gastrograffin study 02/10/13 and aspirated during the procedure.  Pt developed respiratory problems and underwent bronch requring reintubation 5/30-6/1.  Pt underwent sniff test to evaluate diaphragm function, which was found to be negative.  Given hx of GI issues, aspiration during gastrograffin test and current Bell's Palsy, swallow eval was ordered.    Initially critical care service ordered at BSE and surgical MD had ordered MBS and barium swallow per SLP recommendations.  Pt was reticent re: participating in [facility] and had questions re:  procedure and SLP conducted BSE in room to assure ready for instrumental evaluation.  Pt has h/o esophageal dysphagia but had recent onset of oral deficits characterized by difficulties forming food boluses and orally transiting since Bell's palsy (April 2014) per pt.   Type of Study: Bedside swallow evaluation Diet Prior to this Study: NPO;TNA Temperature Spikes Noted: No Respiratory Status: Supplemental O2 delivered via (comment) (2 liters) History of Recent Intubation: Yes Length of Intubations (days): 5 days Date extubated: 02/13/13 Behavior/Cognition: Alert;Cooperative;Pleasant mood Oral Cavity - Dentition: Adequate natural dentition Self-Feeding Abilities: Able to feed self Patient Positioning: Upright in bed Baseline Vocal Quality: Wet;Hoarse;Low vocal intensity Volitional Cough: Strong Volitional Swallow: Able to elicit    Oral/Motor/Sensory Function Overall Oral Motor/Sensory Function:  (Rt facial, labial decr movement from h/o Bell's palsy) Labial ROM: Reduced right Labial Strength: Reduced Lingual ROM: Within Functional Limits Lingual  Symmetry: Within Functional Limits Facial ROM: Reduced right Facial Symmetry: Right droop;Right drooping eyelid (mild right droop) Facial Strength: Reduced Velum: Within Functional Limits Mandible: Within Functional Limits   Ice Chips Ice chips: Impaired Presentation: Self Fed Pharyngeal Phase Impairments: Other (comments) (multiple swallows may indicate pharyngeal stasis)   Thin Liquid Thin Liquid: Impaired Presentation: Cup;Self Fed Pharyngeal  Phase Impairments: Multiple swallows;Cough - Delayed Other Comments: approx five swallows to clear small single bolus of thin water, delayed productive cough    Nectar Thick Nectar Thick Liquid: Not tested   Honey Thick Honey Thick Liquid: Not tested   Puree Puree: Not tested   Solid   GO    Solid: Not tested       Victor Burnet, MS [facility] SLP [phone]

## 2013-02-18 NOTE — Clinical Social Work Placement (Signed)
Clinical Social Work Department CLINICAL SOCIAL WORK PLACEMENT NOTE 02/18/2013  Patient:  SPYRIDON, HORNSTEIN  Account Number:  [id number] Admit date:  02/08/2013  Clinical Social Worker:  Jodelle Red  Date/time:  02/18/2013 11:23 AM  Clinical Social Work is seeking post-discharge placement for this patient at the following level of care:   SKILLED NURSING   (*CSW will update this form in Epic as items are completed)   02/17/2013  Patient/family provided with [facility] Department of Clinical Social Work's list of facilities offering this level of care within the geographic area requested by the patient (or if unable, by the patient's family).  02/17/2013  Patient/family informed of their freedom to choose among providers that offer the needed level of care, that participate in Medicare, Medicaid or managed care program needed by the patient, have an available bed and are willing to accept the patient.  02/17/2013  Patient/family informed of [facility]' ownership interest in [facility], as well as of the fact that they are under no obligation to receive care at this facility.  PASARR submitted to EDS on 02/17/2013 PASARR number received from EDS on 02/17/2013  FL2 transmitted to all facilities in geographic area requested by pt/family on  02/17/2013 FL2 transmitted to all facilities within larger geographic area on   Patient informed that his/her managed care company has contracts with or will negotiate with  certain facilities, including the following:     Patient/family informed of bed offers received:  02/18/2013 Patient chooses bed at  Physician recommends and patient chooses bed at    Patient to be transferred to  on   Patient to be transferred to facility by   The following physician request were entered in Epic:   Additional Comments: fAMILY HOPEFUL FOR CIR, IF NOT ACCEPTED INTERESTED IN [place] [place] [facility] [place] Doreen Salvage,  LCSW ICU/Stepdown Clinical Social Worker [facility] Cell [phone] Hours 8am-1200pm M-F

## 2013-02-19 ENCOUNTER — Inpatient Hospital Stay (HOSPITAL_COMMUNITY): Payer: Medicare Other

## 2013-02-19 DIAGNOSIS — E46 Unspecified protein-calorie malnutrition: Secondary | ICD-10-CM

## 2013-02-19 LAB — BASIC METABOLIC PANEL
BUN: 38 mg/dL — ABNORMAL HIGH (ref 6–23)
CO2: 31 mEq/L (ref 19–32)
Calcium: 9.3 mg/dL (ref 8.4–10.5)
Chloride: 102 mEq/L (ref 96–112)
Creatinine, Ser: 0.88 mg/dL (ref 0.50–1.35)
GFR calc Af Amer: >90 mL/min (ref >90)
GFR calc non Af Amer: 78 mL/min — ABNORMAL LOW (ref >90)
Glucose, Bld: 163 mg/dL — ABNORMAL HIGH (ref 70–99)
Potassium: 3.8 mEq/L (ref 3.5–5.1)
Sodium: 140 mEq/L (ref 135–145)

## 2013-02-19 LAB — GLUCOSE, CAPILLARY
Glucose-Capillary: 117 mg/dL — ABNORMAL HIGH (ref 70–99)
Glucose-Capillary: 119 mg/dL — ABNORMAL HIGH (ref 70–99)
Glucose-Capillary: 123 mg/dL — ABNORMAL HIGH (ref 70–99)
Glucose-Capillary: 125 mg/dL — ABNORMAL HIGH (ref 70–99)
Glucose-Capillary: 130 mg/dL — ABNORMAL HIGH (ref 70–99)
Glucose-Capillary: 135 mg/dL — ABNORMAL HIGH (ref 70–99)
Glucose-Capillary: 137 mg/dL — ABNORMAL HIGH (ref 70–99)

## 2013-02-19 LAB — FUNGUS CULTURE, BLOOD: Culture: NO GROWTH

## 2013-02-19 MED ORDER — CLINIMIX E/DEXTROSE (5/20) 5 % IV SOLN
INTRAVENOUS | Status: AC
  Administered 2013-02-19: 18:00:00 via INTRAVENOUS
  Filled 2013-02-19: qty 2000

## 2013-02-19 NOTE — Progress Notes (Signed)
Physical Therapy Treatment Patient Details Name: Victor Sutton MRN: [id number] DOB: 1928/03/23 Today's Date: 02/19/2013 Time: 1478-2956 PT Time Calculation (min): 27 min  PT Assessment / Plan / Recommendation Comments on Treatment Session  pt is motivated and making excellent progress     Follow Up Recommendations  CIR     Does the patient have the potential to tolerate intense rehabilitation     Barriers to Discharge        Equipment Recommendations  None recommended by PT    Recommendations for Other Services    Frequency Min 3X/week   Plan Discharge plan remains appropriate;Frequency remains appropriate    Precautions / Restrictions Precautions Precautions: Fall   Pertinent Vitals/Pain     Mobility  Bed Mobility Bed Mobility: Supine to Sit Supine to Sit: 5: Supervision;HOB elevated Sitting - Scoot to Edge of Bed: 5: Supervision Details for Bed Mobility Assistance: increased time Transfers Transfers: Sit to Stand;Stand to Sit Sit to Stand: 4: Min guard;From bed Stand to Sit: 4: Min guard;To chair/3-in-1;With upper extremity assist Details for Transfer Assistance: cues for pushing from bed, reaching to armrests. Ambulation/Gait Ambulation/Gait Assistance: 4: Min assist Ambulation Distance (Feet): 100 Feet Assistive device: Rolling walker Ambulation/Gait Assistance Details: cues for RW safety Gait Pattern: Step-through pattern;Decreased stride length    Exercises Total Joint Exercises Quad Sets: AROM;Both;15 reps Towel Squeeze: Strengthening;Both;15 reps Short Arc Quad: Strengthening;Both;15 reps Heel Slides: Strengthening;15 reps;Both Hip ABduction/ADduction: Strengthening;Both;15 reps Straight Leg Raises: Strengthening;Both;15 reps Long Arc Quad: Strengthening;Both;15 reps General Exercises - Upper Extremity Shoulder Flexion: Strengthening;Both;15 reps   PT Diagnosis:    PT Problem List:   PT Treatment Interventions:     PT Goals Acute Rehab PT  Goals Time For Goal Achievement: 03/02/13 Potential to Achieve Goals: Good Pt will go Supine/Side to Sit: Independently PT Goal: Supine/Side to Sit - Progress: Updated due to goal met Pt will go Sit to Stand: with supervision PT Goal: Sit to Stand - Progress: Progressing toward goal Pt will go Stand to Sit: with supervision PT Goal: Stand to Sit - Progress: Progressing toward goal Pt will Ambulate: 51 - 150 feet;with supervision;with rolling walker PT Goal: Ambulate - Progress: Progressing toward goal Pt will Perform Home Exercise Program: with supervision, verbal cues required/provided PT Goal: Perform Home Exercise Program - Progress: Progressing toward goal  Visit Information  Last PT Received On: 02/19/13    Subjective Data  Subjective: Victor Sutton was more important Patient Stated Goal: wants to get swallowing/manage secretions better   Cognition  Cognition Arousal/Alertness: Awake/alert Behavior During Therapy: WFL for tasks assessed/performed Overall Cognitive Status: Within Functional Limits for tasks assessed    Balance  Static Standing Balance Static Standing - Balance Support: Bilateral upper extremity supported Static Standing - Level of Assistance: 5: Stand by assistance  End of Session PT - End of Session Equipment Utilized During Treatment: Gait belt Activity Tolerance: Patient tolerated treatment well Patient left: in chair;with call bell/phone within reach;with family/visitor present   GP     [facility] 02/19/2013, 4:19 PM

## 2013-02-19 NOTE — Progress Notes (Signed)
Spoke with Dr. Carolynne Edouard states to cancel all barium studies, even the modified barium study. Also informed Dr. Carolynne Edouard that patient not wanting PICC line due to having to lay flat for PICC line placement due to had this done in ICCU last week and aspirated and was intubated after and would like to speak with MD, MD states unavailable to speak with patient at this time, but patient needs PICC to replace current line due to expiration, but to leave current line until we are able to establish another access. Informed MD that patient has spoken about waiting for Dr. Abbey Chatters Monday before PICC line placed, Dr. Carolynne Edouard states if patient refuses to keep current access.

## 2013-02-19 NOTE — Progress Notes (Signed)
PULMONARY  / CRITICAL CARE MEDICINE  Name: Victor Sutton MRN: [id number] DOB: 1927/09/16    ADMISSION DATE:  02/08/2013 CONSULTATION DATE:  02/08/13  REFERRING MD :  Okey Dupre PRIMARY SERVICE: Surgery  CHIEF COMPLAINT:  S/P hiatal hernia repair  BRIEF PATIENT DESCRIPTION: 79 year old male s/p open repair of hiatal hernia 02/08/13 admitted following respiratory failure and A-fib with RVR in PACU.  LINES / TUBES: 5/27 ETT>>> 5/28 5/30 ETT >> 6/01 R IJ CVL 5/30 >>   CULTURES: BAL 5/30 >> 30 k serratia Blood 5/30 >>ng  ANTIBIOTICS: Micafungin 5/30 >> 6/01 Vanc 5/30 >> 6/02 Zosyn 5/30 >> 6/02 Rocephin 6/02 >>    SIGNIFICANT EVENTS / STUDIES:  5/27 S/P open repair of hiatal hernia 5/29 Gastrograffin swallow study > aspirated 5/30 Intubated for respiratory distress, bronchoscopy 6/03 SNIFF test negative  SUBJECTIVE: Patient reports sleeping very well last night, able to lie with head support Patient denies chest pain, palpitations, lightheadedness, dizziness, cough, abdominal pain.        VITAL SIGNS: Temp:  [97.4 F (36.3 C)-99.2 F (37.3 C)] 97.4 F (36.3 C) (06/07 0800) Pulse Rate:  [85-103] 85 (06/07 0800) Resp:  [18-26] 18 (06/07 0800) BP: (114-148)/(69-82) 124/69 mmHg (06/07 0800) SpO2:  [93 %-96 %] 95 % (06/07 0822) Weight:  [84.3 kg (185 lb 13.6 oz)] 84.3 kg (185 lb 13.6 oz) (06/07 0400) 3 liters [place]  INTAKE / OUTPUT: Intake/Output     06/06 0701 - 06/07 0700 06/07 0701 - 06/08 0700   I.V. (mL/kg) 460 (5.5) 40 (0.5)   IV Piggyback     TPN 2029 186   Total Intake(mL/kg) 2489 (29.5) 226 (2.7)   Urine (mL/kg/hr) 1950 (1)    Total Output 1950     Net +539 +226          PHYSICAL EXAMINATION: General: NAD, lying in bed Neuro: A&Ox 4.  Appropriate conversational skills, and can respond to commands.  CN II-XII grossly intact.  Still mild assymetry noted on smile.  Sensation to light touch grossly intact.  Appropriate motor strength noted. HEENT:  PERLA, EOM  intact, oral mucosa moist, no evidence of erythema or exudate in oropharynx. Cardiovascular:  RRR, no appreciable murmurs rubs or gallops. Lungs: No accessory muscle usage or retractions noted on inspection, effort normal on 2 L [place],coarse BS onleft Abdomen:  Positive bowel sounds, abdomen tympanetic on percussion, soft and nontender to palpation.  Musculoskeletal:  Appropriate motor strength Skin:  No noted erythema or rashes on exam   LABS:  BMET    Component Value Date/Time   NA 140 02/19/2013 0433   K 3.8 02/19/2013 0433   CL 102 02/19/2013 0433   CO2 31 02/19/2013 0433   GLUCOSE 163* 02/19/2013 0433   BUN 38* 02/19/2013 0433   CREATININE 0.88 02/19/2013 0433   CALCIUM 9.3 02/19/2013 0433   GFRNONAA 78* 02/19/2013 0433   GFRAA >90 02/19/2013 0433    CBC    Component Value Date/Time   WBC 8.7 02/17/2013 0415   RBC 4.74 02/17/2013 0415   HGB 12.9* 02/17/2013 0415   HCT 42.0 02/17/2013 0415   PLT 358 02/17/2013 0415   MCV 88.6 02/17/2013 0415   MCH 27.2 02/17/2013 0415   MCHC 30.7 02/17/2013 0415   RDW 17.4* 02/17/2013 0415   LYMPHSABS 1.0 02/14/2013 0530   MONOABS 0.9 02/14/2013 0530   EOSABS 0.0 02/14/2013 0530   BASOSABS 0.0 02/14/2013 0530    CXR: No results found. CBG (last 3)  Recent Labs  02/18/13 1123 02/18/13 1735 02/18/13 1954  GLUCAP 126* 119* 115*      ASSESSMENT / PLAN:  PULMONARY A: Acute post op respiratory failure s/p repair of hiatal hernia. 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 >> negative SNIFF test P: -continue scheduled BD's for now >> Former smoker, consider evaluation for obstructive lung disease as an outpatient after acute illness resolved  CARDIOVASCULAR A: A-flutter with RVR > in NSR. P:  -Monitor on tele -Change lasix to 40 mg qdaily on 6/06 -prn lopressor, hydralazine for elevated blood pressure  RENAL A: Hypokalemia. P:   -f/u bmet and replace electrolytes as needed while on lasix  GASTROINTESTINAL A:  S/P repair of hiatal hernia  and Nissen Fundoplication 02/10/13. Dysphagia. P:   -Continue TPN per CCS -Failed Modified swallow study 6/06 >>rpt planned next week , hope to avoid PEG   HEMATOLOGIC A:  No active issues P:  -Monitor -SQ heparin for DVT prevention  INFECTIOUS A:  Aspiration PNA/pneumonitis >> Serratia in sputum. P:   -completed rocephin x 7ds.   ENDOCRINE A: Hyperglycemia on TPN  P:   -Continue SSI  NEUROLOGIC A: Recent hx of Bell's palsy.  Anxiety >> improved. P:   -monitor neurologic exam -prn klonopin, fentanyl  Today's Summary:  Patient continues to do well.  Acute respiratory failure post-op seems to be improving.  Aspiration pneumonia seems to be resolved with Rocephin.  OK to transfer to tele  Will need PFT's as outpt when more stable to further assess for obstructive lung disease with hx of smoking.   Keep CVL in for now while getting TNA.    PCCM can be available as needed over weekend.  Will f/u on 6/09  [facility] V. [phone] 02/19/2013, 9:46 AM

## 2013-02-19 NOTE — Progress Notes (Signed)
Patient states would just like to speak to someone about the details of PICC line placement. IV team called and Victor Sutton PICC line RN in room speaking with patient about PICC line placement and his concerns.

## 2013-02-19 NOTE — Progress Notes (Signed)
Speech Language Pathology Dysphagia Treatment Patient Details Name: Victor Sutton MRN: [id number] DOB: January 01, 1928 Today's Date: 02/19/2013 Time: 4098-1191 SLP Time Calculation (min): 27 min  Assessment / Plan / Recommendation Clinical Impression  Pt. seen today for dysphagia treatment and appropriateness for objective assessment.  Oral hygiene provided and re-educated pt./wife regarding clinical reasoning and importance.  Vocal quality noted to be clear prior to po's which appears improved from yesterday's session.  Pt. consumed ice chips with fair-adequate laryngeal elevation during palpation.  Suspect pharyngeal residue as evidenced by multiple swallows (3-4) for one ice chip.  Pt. instructed on 2 pharyngeal exercises that may faciliate overall laryngeal strengthening until formal assessment is completed to objectively determine treatment plan.  Recommend MBS to fully assess oropharyngeal swallow on Monday 6/9.  Recommend he continue oral care followed by ice chips.         Diet Recommendation  Continue with Current Diet: NPO (ice chips)    SLP Plan MBS   Pertinent Vitals/Pain none   Swallowing Goals  SLP Swallowing Goals Goal #3: Pt will demonstrate ability to recognize wet vocalizations and indication to clear to maximize airway protection with mod I.  Swallow Study Goal #3 - Progress: Progressing toward goal Goal #4: Pt will demonstrate ability to swallow small single bolus of water with 1-3 reflexive swallows each bolus to aid in determining readiness for instrumental evaluation.   Swallow Study Goal #4 - Progress: Progressing toward goal  General Temperature Spikes Noted: No Respiratory Status: Room air Behavior/Cognition: Alert;Cooperative;Pleasant mood Oral Cavity - Dentition: Adequate natural dentition Patient Positioning: Upright in bed  Oral Cavity - Oral Hygiene Does patient have any of the following "at risk" factors?: Other - dysphagia Brush patient's teeth BID with  toothbrush (using toothpaste with fluoride): Yes Patient is HIGH RISK - Oral Care Protocol followed (see row info): Yes   Dysphagia Treatment Treatment focused on: Patient/family/caregiver education;Facilitation of pharyngeal phase;Facilitation of oral phase Family/Caregiver Educated: wife Treatment Methods/Modalities: Differential diagnosis;Skilled observation Patient observed directly with PO's: Yes Type of PO's observed: Ice chips Feeding: Able to feed self Liquids provided via: Teaspoon Pharyngeal Phase Signs & Symptoms: Multiple swallows;Delayed throat clear Type of cueing: Verbal Amount of cueing: Minimal   GO     Royce Macadamia M.Ed [employer] [phone]  02/19/2013

## 2013-02-19 NOTE — Progress Notes (Signed)
11 Days Post-Op  Subjective: Feels good today. No complaints  Objective: Vital signs in last 24 hours: Temp:  [97.6 F (36.4 C)-99.2 F (37.3 C)] 97.7 F (36.5 C) (06/07 0400) Pulse Rate:  [92-103] 92 (06/07 0425) Resp:  [18-26] 26 (06/07 0425) BP: (114-148)/(69-82) 122/69 mmHg (06/07 0425) SpO2:  [93 %-96 %] 94 % (06/07 0425) Weight:  [185 lb 13.6 oz (84.3 kg)] 185 lb 13.6 oz (84.3 kg) (06/07 0400) Last BM Date: 02/15/13  Intake/Output from previous day: 06/06 0701 - 06/07 0700 In: 2376 [I.V.:440; TPN:1936] Out: 1950 [Urine:1950] Intake/Output this shift:    Resp: clear to auscultation bilaterally GI: soft, nontender  Lab Results:   Recent Labs  02/17/13 0415  WBC 8.7  HGB 12.9*  HCT 42.0  PLT 358   BMET  Recent Labs  02/18/13 0500 02/19/13 0433  NA 141 140  K 3.6 3.8  CL 102 102  CO2 35* 31  GLUCOSE 179* 163*  BUN 36* 38*  CREATININE 0.89 0.88  CALCIUM 9.4 9.3   PT/INR No results found for this basename: LABPROT, INR,  in the last 72 hours ABG No results found for this basename: PHART, PCO2, PO2, HCO3,  in the last 72 hours  Studies/Results: No results found.  Anti-infectives: Anti-infectives   Start     Dose/Rate Route Frequency Ordered Stop   02/14/13 1400  cefTRIAXone (ROCEPHIN) 2 g in dextrose 5 % 50 mL IVPB     2 g 100 mL/hr over 30 Minutes Intravenous Every 24 hours 02/14/13 1143 02/18/13 1512   02/12/13 1000  vancomycin (VANCOCIN) IVPB 1000 mg/200 mL premix  Status:  Discontinued     1,000 mg 200 mL/hr over 60 Minutes Intravenous Every 12 hours 02/12/13 0943 02/14/13 1137   02/11/13 2300  piperacillin-tazobactam (ZOSYN) IVPB 3.375 g  Status:  Discontinued     3.375 g 12.5 mL/hr over 240 Minutes Intravenous Every 8 hours 02/11/13 2156 02/14/13 1144   02/11/13 2300  vancomycin (VANCOCIN) IVPB 750 mg/150 ml premix  Status:  Discontinued     750 mg 150 mL/hr over 60 Minutes Intravenous Every 12 hours 02/11/13 2156 02/12/13 0943   02/11/13 2230  micafungin (MYCAMINE) 100 mg in sodium chloride 0.9 % 100 mL IVPB  Status:  Discontinued     100 mg 100 mL/hr over 1 Hours Intravenous Daily at bedtime 02/11/13 2137 02/13/13 1023   02/08/13 1700  ceFAZolin (ANCEF) IVPB 1 g/50 mL premix     1 g 100 mL/hr over 30 Minutes Intravenous Every 6 hours 02/08/13 1639 02/09/13 0539   02/08/13 0650  ceFAZolin (ANCEF) IVPB 2 g/50 mL premix     2 g 100 mL/hr over 30 Minutes Intravenous On call to O.R. 02/08/13 0650 02/08/13 0957      Assessment/Plan: s/p Procedure(s):  REPAIR OFCURRENT HIATAL HERNIA,  (N/A) UPPER GI ENDOSCOPY (N/A) LAPAROSCOPIC LYSIS OF ADHESIONS (N/A) LAPAROSCOPIC NISSEN FUNDOPLICATION continue TPN for nutrition support Speech to re evaluate early next week for swallowing study Will tranfer to floor  LOS: 11 days    TOTH III,Purva Vessell S 02/19/2013

## 2013-02-19 NOTE — Progress Notes (Signed)
Patient states he does not want PICC at this time, and per IV team RN IJ looks good and will be left in

## 2013-02-19 NOTE — Progress Notes (Signed)
PARENTERAL NUTRITION CONSULT NOTE - FOLLOW UP  Pharmacy Consult for TPN Indication: dysphagia with Bell's Palsy, aspiration s/p hernia repair/Nissen fundoplication 5/27  No Known Allergies  Patient Measurements: Height: 5\' 10"  (177.8 cm) Weight: 185 lb 13.6 oz (84.3 kg) IBW/kg (Calculated) : 73  Vital Signs: Temp: 97.7 F (36.5 C) (06/07 0400) Temp src: Oral (06/07 0400) BP: 122/69 mmHg (06/07 0425) Pulse Rate: 92 (06/07 0425) Intake/Output from previous day: 06/06 0701 - 06/07 0700 In: 2376 [I.V.:440; TPN:1936] Out: 1950 [Urine:1950]  Labs:  Recent Labs  02/17/13 0415  WBC 8.7  HGB 12.9*  HCT 42.0  PLT 358    Recent Labs  02/17/13 0415 02/18/13 0500 02/19/13 0433  NA 143 141 140  K 3.5 3.6 3.8  CL 103 102 102  CO2 33* 35* 31  GLUCOSE 115* 179* 163*  BUN 33* 36* 38*  CREATININE 0.84 0.89 0.88  CALCIUM 9.3 9.4 9.3  MG 2.5 2.5  --   PHOS 4.0 4.0  --   PROT 6.9 6.8  --   ALBUMIN 2.3* 2.3*  --   AST 121* 113*  --   ALT 102* 112*  --   ALKPHOS 84 91  --   BILITOT 0.8 0.8  --   Corr Ca = 10.46 (6/5) Estimated Creatinine Clearance: 66.8 ml/min (by C-G formula based on Cr of 0.88).    Recent Labs  02/18/13 1123 02/18/13 1735 02/18/13 1954  GLUCAP 126* 119* 115*   Medications:  Scheduled:  . albuterol  2.5 mg Nebulization Q6H  . antiseptic oral rinse  15 mL Mouth Rinse QID  . chlorhexidine  15 mL Mouth Rinse BID  . furosemide  40 mg Intravenous Daily  . heparin subcutaneous  5,000 Units Subcutaneous Q8H  . insulin aspart  0-9 Units Subcutaneous Q4H  . pantoprazole (PROTONIX) IV  40 mg Intravenous QHS   Infusions:  . sodium chloride 20 mL/hr at 02/18/13 0353  . Marland KitchenTPN (CLINIMIX-E) Adult 83 mL/hr at 02/18/13 1757   And  . fat emulsion 250 mL (02/18/13 1757)    Nutritional Goals:   RD recs (5/30): 1980-2310 Kcal, 110-128 g Protein, 2.5 L fluid  Clinimix 5/20 at 83 ml/hr + Lipids MWF to provide 100 g protein/day and an average of  1959 Kcal/day.    Current nutrition:   Diet: NPO  [facility]: [facility] at [facility]  Clinimix E 5/20 at 83 ml/hr  Lipids 20% MWF  CBGs & Insulin requirements past 24 hours:   CBGs ordered q4h:  Within goal of < 150 mg/dl  Sensitive SSI :  4 WGNFA/21H  Assessment:  79 yo M admit 5/27 s/p laparoscopic hernia repair and Nissen fundoplication on 5/27.  The pt had aspiration during UGI swallow evaluation on 5/29.  He has a history of poor PO intake prior to admission and has severe malnutrition (40lb wt loss PTA).  IV team unable to place PICC; CCM to place central line on 5/30 (R IJ), plan PICC if fails swallow study  6/7:  Day #9 TPN.  Extubated 77/1. TPN at goal and tolerating thus far, AST/ALT slowly trending up.  Bedside swallow study: severe aspiration, deferred Barium swallow for Monday per SLP  Renal function:  SCr is stable/WNL, excellent UOP with ~equal fluid balance (on lasix 40mg  IV now q24 hr)  Hepatic function: AST/ALT elevated, Tbili was elevated, now improved to WNL  Electrolytes: K improved, had required daily supplementation   Pre-Albumin: 8.9 (5/31), 9.4 (6/2)  TG/Cholesterol: WNL (5/31), 106 (6/2)  Glucose:  Within goal range (< 150 mg/dl)  Plan:  Continue Clinimix E 5/20 to goal 83 ml/hr tonight  Fat emulsion at 10 ml/hr to begin daily 6/9 (shortage abated for now) BMET am, Mag and Phos levels in am. Watching Mg/Phos level as trending up and approaching ULN - may need to remove lytes from TPN TNA to contain standard multivitamins and trace elements daily from 6/9 Continue sensitive SSI q4h (CBGs q4h) TNA lab panels on Mondays & Thursdays. Discontinue Cholesterol monitoring   Otho Bellows PharmD Pager [phone] 02/19/2013, 7:54 AM

## 2013-02-20 LAB — MAGNESIUM: Magnesium: 2.5 mg/dL (ref 1.5–2.5)

## 2013-02-20 LAB — GLUCOSE, CAPILLARY
Glucose-Capillary: 133 mg/dL — ABNORMAL HIGH (ref 70–99)
Glucose-Capillary: 120 mg/dL — ABNORMAL HIGH (ref 70–99)
Glucose-Capillary: 127 mg/dL — ABNORMAL HIGH (ref 70–99)
Glucose-Capillary: 130 mg/dL — ABNORMAL HIGH (ref 70–99)

## 2013-02-20 LAB — BASIC METABOLIC PANEL
BUN: 37 mg/dL — ABNORMAL HIGH (ref 6–23)
CO2: 31 mEq/L (ref 19–32)
Calcium: 9.4 mg/dL (ref 8.4–10.5)
Chloride: 103 mEq/L (ref 96–112)
Creatinine, Ser: 0.84 mg/dL (ref 0.50–1.35)
GFR calc Af Amer: >90 mL/min (ref >90)
GFR calc non Af Amer: 79 mL/min — ABNORMAL LOW (ref >90)
Glucose, Bld: 126 mg/dL — ABNORMAL HIGH (ref 70–99)
Potassium: 3.4 mEq/L — ABNORMAL LOW (ref 3.5–5.1)
Sodium: 140 mEq/L (ref 135–145)

## 2013-02-20 LAB — PHOSPHORUS: Phosphorus: 3.6 mg/dL (ref 2.3–4.6)

## 2013-02-20 MED ORDER — INSULIN ASPART 100 UNIT/ML ~~LOC~~ SOLN
0.0000 [IU] | Freq: Four times a day (QID) | SUBCUTANEOUS | Status: DC
  Administered 2013-02-20 – 2013-02-22 (×6): 1 [IU] via SUBCUTANEOUS

## 2013-02-20 MED ORDER — SODIUM CHLORIDE 0.9 % IJ SOLN
10.0000 mL | INTRAMUSCULAR | Status: DC | PRN
  Administered 2013-02-22 – 2013-02-24 (×7): 10 mL

## 2013-02-20 MED ORDER — CLINIMIX E/DEXTROSE (5/20) 5 % IV SOLN
INTRAVENOUS | Status: AC
  Administered 2013-02-20: 18:00:00 via INTRAVENOUS
  Filled 2013-02-20: qty 2000

## 2013-02-20 MED ORDER — LIP MEDEX EX OINT
TOPICAL_OINTMENT | CUTANEOUS | Status: AC
  Administered 2013-02-20: 04:00:00
  Filled 2013-02-20: qty 7

## 2013-02-20 MED ORDER — POTASSIUM CHLORIDE 10 MEQ/100ML IV SOLN
10.0000 meq | INTRAVENOUS | Status: AC
  Administered 2013-02-20 (×4): 10 meq via INTRAVENOUS
  Filled 2013-02-20 (×4): qty 100

## 2013-02-20 NOTE — Progress Notes (Signed)
12 Days Post-Op  Subjective: No complaints.he would like to wait till tomorrow to change line out for PICC  Objective: Vital signs in last 24 hours: Temp:  [97.9 F (36.6 C)] 97.9 F (36.6 C) (06/08 0400) Pulse Rate:  [54-99] 54 (06/08 0400) Resp:  [18] 18 (06/08 0400) BP: (113-125)/(65-69) 125/69 mmHg (06/08 0400) SpO2:  [93 %-95 %] 93 % (06/08 0831) Weight:  [187 lb 13.3 oz (85.2 kg)] 187 lb 13.3 oz (85.2 kg) (06/08 0500) Last BM Date: 02/18/13  Intake/Output from previous day: 06/07 0701 - 06/08 0700 In: 2439 [I.V.:460; TPN:1979] Out: 1400 [Urine:1400] Intake/Output this shift: Total I/O In: -  Out: 200 [Urine:200]  GI: soft, nontender  Lab Results:  No results found for this basename: WBC, HGB, HCT, PLT,  in the last 72 hours BMET  Recent Labs  02/19/13 0433 02/20/13 0430  NA 140 140  K 3.8 3.4*  CL 102 103  CO2 31 31  GLUCOSE 163* 126*  BUN 38* 37*  CREATININE 0.88 0.84  CALCIUM 9.3 9.4   PT/INR No results found for this basename: LABPROT, INR,  in the last 72 hours ABG No results found for this basename: PHART, PCO2, PO2, HCO3,  in the last 72 hours  Studies/Results: No results found.  Anti-infectives: Anti-infectives   Start     Dose/Rate Route Frequency Ordered Stop   02/14/13 1400  cefTRIAXone (ROCEPHIN) 2 g in dextrose 5 % 50 mL IVPB     2 g 100 mL/hr over 30 Minutes Intravenous Every 24 hours 02/14/13 1143 02/18/13 1512   02/12/13 1000  vancomycin (VANCOCIN) IVPB 1000 mg/200 mL premix  Status:  Discontinued     1,000 mg 200 mL/hr over 60 Minutes Intravenous Every 12 hours 02/12/13 0943 02/14/13 1137   02/11/13 2300  piperacillin-tazobactam (ZOSYN) IVPB 3.375 g  Status:  Discontinued     3.375 g 12.5 mL/hr over 240 Minutes Intravenous Every 8 hours 02/11/13 2156 02/14/13 1144   02/11/13 2300  vancomycin (VANCOCIN) IVPB 750 mg/150 ml premix  Status:  Discontinued     750 mg 150 mL/hr over 60 Minutes Intravenous Every 12 hours 02/11/13 2156  02/12/13 0943   02/11/13 2230  micafungin (MYCAMINE) 100 mg in sodium chloride 0.9 % 100 mL IVPB  Status:  Discontinued     100 mg 100 mL/hr over 1 Hours Intravenous Daily at bedtime 02/11/13 2137 02/13/13 1023   02/08/13 1700  ceFAZolin (ANCEF) IVPB 1 g/50 mL premix     1 g 100 mL/hr over 30 Minutes Intravenous Every 6 hours 02/08/13 1639 02/09/13 0539   02/08/13 0650  ceFAZolin (ANCEF) IVPB 2 g/50 mL premix     2 g 100 mL/hr over 30 Minutes Intravenous On call to O.R. 02/08/13 0650 02/08/13 0957      Assessment/Plan: s/p Procedure(s):  REPAIR OFCURRENT HIATAL HERNIA,  (N/A) UPPER GI ENDOSCOPY (N/A) LAPAROSCOPIC LYSIS OF ADHESIONS (N/A) LAPAROSCOPIC NISSEN FUNDOPLICATION will continue tpn for protein calorie malnutrition Try for PICC tomorrow  LOS: 12 days    TOTH III,Aayush Gelpi S 02/20/2013

## 2013-02-20 NOTE — Progress Notes (Addendum)
PARENTERAL NUTRITION CONSULT NOTE - FOLLOW UP  Pharmacy Consult for TPN Indication: dysphagia with Bell's Palsy, aspiration s/p hernia repair/Nissen fundoplication 5/27  No Known Allergies  Patient Measurements: Height: 5\' 10"  (177.8 cm) Weight: 187 lb 13.3 oz (85.2 kg) IBW/kg (Calculated) : 73  Vital Signs: Temp: 97.9 F (36.6 C) (06/08 0400) Temp src: Oral (06/08 0400) BP: 125/69 mmHg (06/08 0400) Pulse Rate: 54 (06/08 0400) Intake/Output from previous day: 06/07 0701 - 06/08 0700 In: 2439 [I.V.:460; TPN:1979] Out: 1400 [Urine:1400]  Labs: No results found for this basename: WBC, HGB, HCT, PLT, APTT, INR,  in the last 72 hours  Recent Labs  02/18/13 0500 02/19/13 0433 02/20/13 0430  NA 141 140 140  K 3.6 3.8 3.4*  CL 102 102 103  CO2 35* 31 31  GLUCOSE 179* 163* 126*  BUN 36* 38* 37*  CREATININE 0.89 0.88 0.84  CALCIUM 9.4 9.3 9.4  MG 2.5  --  2.5  PHOS 4.0  --  3.6  PROT 6.8  --   --   ALBUMIN 2.3*  --   --   AST 113*  --   --   ALT 112*  --   --   ALKPHOS 91  --   --   BILITOT 0.8  --   --   Corr Ca = 10.46 (6/5) Estimated Creatinine Clearance: 70 ml/min (by C-G formula based on Cr of 0.84).    Recent Labs  02/19/13 2346 02/20/13 0349 02/20/13 0723  GLUCAP 123* 133* 120*   Medications:  Scheduled:  . albuterol  2.5 mg Nebulization Q6H  . antiseptic oral rinse  15 mL Mouth Rinse QID  . chlorhexidine  15 mL Mouth Rinse BID  . furosemide  40 mg Intravenous Daily  . heparin subcutaneous  5,000 Units Subcutaneous Q8H  . insulin aspart  0-9 Units Subcutaneous Q6H  . pantoprazole (PROTONIX) IV  40 mg Intravenous QHS   Infusions:  . Marland KitchenTPN (CLINIMIX-E) Adult 83 mL/hr at 02/19/13 1756  . sodium chloride 20 mL/hr at 02/18/13 0353    Nutritional Goals:   RD recs (5/30): 1980-2310 Kcal, 110-128 g Protein, 2.5 L fluid  Clinimix 5/20 at 83 ml/hr + Lipids MWF to provide 100 g protein/day and an average of  1959 Kcal/day.   Current nutrition:   Diet:  NPO  IVF: NS at [facility]  Clinimix E 5/20 at 83 ml/hr  Lipids 20% MWF  CBGs & Insulin requirements past 24 hours:   CBGs ordered q4h:  Within goal of < 150 mg/dl  Sensitive SSI :  2 FAOZH/08M  Assessment:  79 yo M admit 5/27 s/p laparoscopic hernia repair and Nissen fundoplication on 5/27.  The pt had aspiration during UGI swallow evaluation on 5/29.  He has a history of poor PO intake prior to admission and has severe malnutrition (40lb wt loss PTA).  IV team unable to place PICC; CCM to place central line on 5/30 (R IJ), plan PICC if fails swallow study  6/8:  Day # TPN.  Extubated 5/1. TPN at goal and tolerating thus far, AST/ALT slowly trending up.  Bedside swallow study: severe aspiration, deferred Barium swallow for Monday per SLP-but cancelled by CCS yesterday  Renal function:  SCr is stable/WNL, good UOP with ~equal fluid balance (on lasix 40mg  IV now q24 hr)  Hepatic function: AST/ALT elevated, Tbili was elevated, now improved to WNL  Electrolytes: K improved, now slightly below normal, had required daily supplementation   Mg/Phos level were trending  up and approaching ULN- monitoring closely  Pre-Albumin: 8.9 (5/31), 9.4 (6/2)  TG/Cholesterol: WNL (5/31), 106 (6/2)  Glucose:  Within goal range (< 150 mg/dl)  Plan:  Continue Clinimix E 5/20 to goal 83 ml/hr tonight  KCl 10 mEq IVPB q1hr x 4 today Fat emulsion at 10 ml/hr to begin daily 6/9 (shortage abated for now) TNA to contain standard multivitamins and trace elements daily from 6/9 Continue sensitive SSI; change CBG to q6hr TNA lab panels on Mondays & Thursdays. Discontinued Cholesterol monitoring   Otho Bellows PharmD Pager [phone] 02/20/2013, 9:34 AM

## 2013-02-20 NOTE — Progress Notes (Signed)
Physical Therapy Treatment Patient Details Name: Victor Sutton MRN: [id number] DOB: 03-13-1928 Today's Date: 02/20/2013 Time: 4098-1191 PT Time Calculation (min): 31 min  PT Assessment / Plan / Recommendation Comments on Treatment Session  Pt continues to be very motivated and apologetic for not doing as well today.  Re-assured pt that he is doing well.     Follow Up Recommendations  CIR     Does the patient have the potential to tolerate intense rehabilitation     Barriers to Discharge        Equipment Recommendations  None recommended by PT    Recommendations for Other Services Rehab consult;OT consult  Frequency Min 3X/week   Plan Discharge plan remains appropriate;Frequency remains appropriate    Precautions / Restrictions Precautions Precautions: Fall Precaution Comments: monitor sats. mulitple lines   Pertinent Vitals/Pain No pain    Mobility  Bed Mobility Bed Mobility: Supine to Sit Supine to Sit: 5: Supervision;HOB elevated Details for Bed Mobility Assistance: Supervision for safety with use of rail.  Transfers Transfers: Sit to Stand;Stand to Sit Sit to Stand: 4: Min assist;With upper extremity assist;From bed Stand to Sit: 4: Min guard;With upper extremity assist;With armrests;To chair/3-in-1 Details for Transfer Assistance: Somewhat increased assist today for standing as he says he feels "depleted" from lack of sleep.  Cues for hand placement and safety.  Ambulation/Gait Ambulation/Gait Assistance: 4: Min assist Ambulation Distance (Feet): 100 Feet (x 2 with seated rest break) Assistive device: Rolling walker Ambulation/Gait Assistance Details: Assist to steady with cues for maintaining upright posture and position inside of RW.  Pt states he feels somewhat dizzy initially, however BP was 130/86.   Gait Pattern: Step-through pattern;Decreased stride length    Exercises Total Joint Exercises Ankle Circles/Pumps: AROM;Both;20 reps Quad Sets: AROM;Both;10  reps Towel Squeeze: Strengthening;Both;10 reps Heel Slides: Strengthening;Both;10 reps Straight Leg Raises: Strengthening;Both;10 reps   PT Diagnosis:    PT Problem List:   PT Treatment Interventions:     PT Goals Acute Rehab PT Goals PT Goal Formulation: With patient/family Time For Goal Achievement: 03/02/13 Potential to Achieve Goals: Good Pt will go Supine/Side to Sit: Independently PT Goal: Supine/Side to Sit - Progress: Progressing toward goal Pt will go Sit to Stand: with supervision PT Goal: Sit to Stand - Progress: Progressing toward goal Pt will go Stand to Sit: with supervision PT Goal: Stand to Sit - Progress: Progressing toward goal Pt will Ambulate: 51 - 150 feet;with supervision;with rolling walker PT Goal: Ambulate - Progress: Progressing toward goal Pt will Perform Home Exercise Program: with supervision, verbal cues required/provided PT Goal: Perform Home Exercise Program - Progress: Progressing toward goal  Visit Information  Last PT Received On: 02/20/13 Assistance Needed: +1 (+2 helpful for chair follow)    Subjective Data  Subjective: I really am feeling depleted today.  Patient Stated Goal: I really want to get into inpatient rehab.    Cognition  Cognition Arousal/Alertness: Awake/alert Behavior During Therapy: WFL for tasks assessed/performed Overall Cognitive Status: Within Functional Limits for tasks assessed    Balance  Balance Balance Assessed: Yes Static Standing Balance Static Standing - Balance Support: No upper extremity supported;During functional activity Static Standing - Level of Assistance: 4: Min assist Static Standing - Comment/# of Minutes: pt able to stand in restroom and use urinal without UE support but min assist.   End of Session PT - End of Session Equipment Utilized During Treatment: Gait belt Activity Tolerance: Patient tolerated treatment well;Patient limited by fatigue Patient left: in  chair;with call bell/phone within  reach;with family/visitor present Nurse Communication: Mobility status   GP     Vista Deck 02/20/2013, 2:33 PM

## 2013-02-21 ENCOUNTER — Inpatient Hospital Stay (HOSPITAL_COMMUNITY): Payer: Medicare Other

## 2013-02-21 LAB — CBC
HCT: 41.6 % (ref 39.0–52.0)
Hemoglobin: 13.5 g/dL (ref 13.0–17.0)
MCH: 28.6 pg (ref 26.0–34.0)
MCHC: 32.5 g/dL (ref 30.0–36.0)
MCV: 88.1 fL (ref 78.0–100.0)
Platelets: 397 10*3/uL (ref 150–400)
RBC: 4.72 MIL/uL (ref 4.22–5.81)
RDW: 16.9 % — ABNORMAL HIGH (ref 11.5–15.5)
WBC: 7.5 10*3/uL (ref 4.0–10.5)

## 2013-02-21 LAB — DIFFERENTIAL
Basophils Absolute: 0 10*3/uL (ref 0.0–0.1)
Basophils Relative: 0 % (ref 0–1)
Eosinophils Absolute: 0.6 10*3/uL (ref 0.0–0.7)
Eosinophils Relative: 8 % — ABNORMAL HIGH (ref 0–5)
Lymphocytes Relative: 16 % (ref 12–46)
Lymphs Abs: 1.2 10*3/uL (ref 0.7–4.0)
Monocytes Absolute: 1 10*3/uL (ref 0.1–1.0)
Monocytes Relative: 13 % — ABNORMAL HIGH (ref 3–12)
Neutro Abs: 4.7 10*3/uL (ref 1.7–7.7)
Neutrophils Relative %: 63 % (ref 43–77)

## 2013-02-21 LAB — COMPREHENSIVE METABOLIC PANEL
ALT: 88 U/L — ABNORMAL HIGH (ref 0–53)
AST: 69 U/L — ABNORMAL HIGH (ref 0–37)
Albumin: 2.5 g/dL — ABNORMAL LOW (ref 3.5–5.2)
Alkaline Phosphatase: 100 U/L (ref 39–117)
BUN: 36 mg/dL — ABNORMAL HIGH (ref 6–23)
CO2: 30 mEq/L (ref 19–32)
Calcium: 9.5 mg/dL (ref 8.4–10.5)
Chloride: 104 mEq/L (ref 96–112)
Creatinine, Ser: 0.86 mg/dL (ref 0.50–1.35)
GFR calc Af Amer: >90 mL/min (ref >90)
GFR calc non Af Amer: 79 mL/min — ABNORMAL LOW (ref >90)
Glucose, Bld: 114 mg/dL — ABNORMAL HIGH (ref 70–99)
Potassium: 3.9 mEq/L (ref 3.5–5.1)
Sodium: 139 mEq/L (ref 135–145)
Total Bilirubin: 0.7 mg/dL (ref 0.3–1.2)
Total Protein: 6.9 g/dL (ref 6.0–8.3)

## 2013-02-21 LAB — GLUCOSE, CAPILLARY
Glucose-Capillary: 117 mg/dL — ABNORMAL HIGH (ref 70–99)
Glucose-Capillary: 120 mg/dL — ABNORMAL HIGH (ref 70–99)
Glucose-Capillary: 122 mg/dL — ABNORMAL HIGH (ref 70–99)
Glucose-Capillary: 123 mg/dL — ABNORMAL HIGH (ref 70–99)
Glucose-Capillary: 126 mg/dL — ABNORMAL HIGH (ref 70–99)

## 2013-02-21 LAB — TRIGLYCERIDES: Triglycerides: 79 mg/dL (ref <150)

## 2013-02-21 LAB — PREALBUMIN: Prealbumin: 19.8 mg/dL (ref 17.0–34.0)

## 2013-02-21 LAB — PHOSPHORUS: Phosphorus: 3.2 mg/dL (ref 2.3–4.6)

## 2013-02-21 LAB — MAGNESIUM: Magnesium: 2.5 mg/dL (ref 1.5–2.5)

## 2013-02-21 MED ORDER — TRACE MINERALS CR-CU-F-FE-I-MN-MO-SE-ZN IV SOLN
INTRAVENOUS | Status: AC
  Administered 2013-02-21: 17:00:00 via INTRAVENOUS
  Filled 2013-02-21: qty 2000

## 2013-02-21 MED ORDER — FAT EMULSION 20 % IV EMUL
250.0000 mL | INTRAVENOUS | Status: AC
  Administered 2013-02-21: 250 mL via INTRAVENOUS
  Filled 2013-02-21: qty 250

## 2013-02-21 NOTE — Progress Notes (Signed)
Rehab admissions - Evaluated for possible admission.  I spoke with patient and his wife.  They are very interested in inpatient rehab admission.  However, I received a call from OT saying that patient is doing well and may not need an inpatient rehab stay.  May be able to discharge home when medically ready with [facility] or outpatient therapies.  Call me for questions.  #[phone]

## 2013-02-21 NOTE — Progress Notes (Signed)
PULMONARY  / CRITICAL CARE MEDICINE  Name: Victor Sutton MRN: [id number] DOB: 06/22/1928    ADMISSION DATE:  02/08/2013 CONSULTATION DATE:  02/08/13  REFERRING MD :  Okey Dupre PRIMARY SERVICE: Surgery  CHIEF COMPLAINT:  S/P hiatal hernia repair  BRIEF PATIENT DESCRIPTION: 79 year old male s/p open repair of hiatal hernia 02/08/13 admitted following respiratory failure and A-fib with RVR in PACU.  LINES / TUBES: 5/27 ETT>>> 5/28 5/30 ETT >> 6/01 R IJ CVL 5/30 >>   CULTURES: BAL 5/30 >> 30 k serratia Blood 5/30 >>ng  ANTIBIOTICS: Micafungin 5/30 >> 6/01 Vanc 5/30 >> 6/02 Zosyn 5/30 >> 6/02 Rocephin 6/02 >>  6/9   SIGNIFICANT EVENTS / STUDIES:  5/27 S/P open repair of hiatal hernia 5/29 Gastrograffin swallow study > aspirated 5/30 Intubated for respiratory distress, bronchoscopy 6/03 SNIFF test negative 6-9 swallow eval>> SUBJECTIVE: Patient reports sleeping very well last night, able to lie with head support Patient denies chest pain, palpitations, lightheadedness, dizziness, cough, abdominal pain.        VITAL SIGNS: Temp:  [97.8 F (36.6 C)-98.7 F (37.1 C)] 97.8 F (36.6 C) (06/09 0600) Pulse Rate:  [86-98] 90 (06/09 1005) Resp:  [16-18] 18 (06/09 0600) BP: (117-131)/(69-79) 117/79 mmHg (06/09 1005) SpO2:  [96 %-97 %] 96 % (06/09 1000) Weight:  [83.2 kg (183 lb 6.8 oz)] 83.2 kg (183 lb 6.8 oz) (06/09 0500) 3 liters [place]  INTAKE / OUTPUT: Intake/Output     06/08 0701 - 06/09 0700 06/09 0701 - 06/10 0700   I.V. (mL/kg) 486 (5.8) 74 (0.9)   TPN 2016.9 307.1   Total Intake(mL/kg) 2502.9 (30.1) 381.1 (4.6)   Urine (mL/kg/hr) 2125 (1.1) 100 (0.2)   Total Output 2125 100   Net +377.9 +281.1          PHYSICAL EXAMINATION: General: NAD, lying in bed Neuro: A&Ox 4. NAD. HEENT:no lan. Cardiovascular:  RRR, no appreciable murmurs rubs or gallops. Lungs: No accessory muscle usage or retractions noted on inspection, effort normal Abdomen:  Positive bowel sounds,  abdomen tympanetic on percussion, soft and nontender to palpation.  Musculoskeletal:  Appropriate motor strength Skin:  No noted erythema or rashes on exam   LABS:  BMET    Component Value Date/Time   NA 139 02/21/2013 0520   K 3.9 02/21/2013 0520   CL 104 02/21/2013 0520   CO2 30 02/21/2013 0520   GLUCOSE 114* 02/21/2013 0520   BUN 36* 02/21/2013 0520   CREATININE 0.86 02/21/2013 0520   CALCIUM 9.5 02/21/2013 0520   GFRNONAA 79* 02/21/2013 0520   GFRAA >90 02/21/2013 0520    CBC    Component Value Date/Time   WBC 7.5 02/21/2013 0520   RBC 4.72 02/21/2013 0520   HGB 13.5 02/21/2013 0520   HCT 41.6 02/21/2013 0520   PLT 397 02/21/2013 0520   MCV 88.1 02/21/2013 0520   MCH 28.6 02/21/2013 0520   MCHC 32.5 02/21/2013 0520   RDW 16.9* 02/21/2013 0520   LYMPHSABS 1.2 02/21/2013 0520   MONOABS 1.0 02/21/2013 0520   EOSABS 0.6 02/21/2013 0520   BASOSABS 0.0 02/21/2013 0520    CXR: No results found. CBG (last 3)   Recent Labs  02/20/13 2356 02/21/13 0609 02/21/13 1151  GLUCAP 123* 122* 120*      ASSESSMENT / PLAN:  PULMONARY A: Acute post op respiratory failure s/p repair of hiatal hernia. 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 >> negative SNIFF test P: -continue scheduled BD's for now >> Former  smoker, consider evaluation for obstructive lung disease as an outpatient after acute illness resolved  CARDIOVASCULAR A: A-flutter with RVR > in NSR. P:  -Monitor on tele -Change lasix to 40 mg qdaily on 6/06 -prn lopressor, hydralazine for elevated blood pressure  RENAL A: Hypokalemia. P:   -f/u bmet and replace electrolytes as needed while on lasix  GASTROINTESTINAL A:  S/P repair of hiatal hernia and Nissen Fundoplication 02/10/13. Dysphagia. P:   -Continue TPN per CCS -Failed Modified swallow study 6/06, repeating 6-9   HEMATOLOGIC A:  No active issues P:  -Monitor -SQ heparin for DVT prevention  INFECTIOUS A:  Aspiration PNA/pneumonitis >> Serratia in sputum. P:    -completed rocephin x 7days effective 6/9.   ENDOCRINE A: Hyperglycemia on TPN  P:   -Continue SSI  NEUROLOGIC A: Recent hx of Bell's palsy.  Anxiety >> improved. P:   -monitor neurologic exam -prn klonopin, fentanyl  Today's Summary:  Patient continues to do well.  Acute respiratory failure post-op seems to be improving.  Aspiration pneumonia seems to be resolved with Rocephin.  OK to transfer to tele  Will need PFT's as outpt when more stable to further assess for obstructive lung disease with hx of smoking.   Keep CVL in for now while getting TNA.    PCCM will sign off, call if needed.  Sandrea Hughs, MD Pulmonary and Critical Care Medicine [facility] Cell [phone] After 5:30 PM or weekends, call [phone]

## 2013-02-21 NOTE — Procedures (Addendum)
Objective Swallowing Evaluation: Modified Barium Swallowing Study  Patient Details  Name: Victor Sutton MRN: [id number] Date of Birth: April 27, 1928  Today's Date: 02/21/2013 Time: 4098-1191 SLP Time Calculation (min): 42 min  Past Medical History:  Past Medical History  Diagnosis Date  . BPH (benign prostatic hyperplasia)   . Macular degeneration     (L) wet, (R) dry  . B12 deficiency     HIstory  . 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 Leukoplakia 2001    Treated for  . Hypertension   . Iron deficiency anemia   . Cataract 2009    bilateral cataract surgery  . GERD (gastroesophageal reflux disease)   . Hyperlipidemia     not on medication  . Hiatal hernia   . Peripheral neuropathy     Mild History of  . Ulcer   . Bell's palsy     at present   Past Surgical History:  Past Surgical History  Procedure Laterality Date  . Cholecystectomy    . Arthroscopic knee surgery      left  . Multiple oral surgeries  2002  . Tonsillectomy    . Cateract surgeries       bilateral  . Stomach surgery  2005    states his stomach had moved up toward his chest , some type of surgery performed  . Hernia repair      Laparoscopic ventral  . Hiatal hernia repair  2004  . Skin cancer excision  10/2012    right leg-shin area  . Hiatal hernia repair N/A 02/08/2013    Procedure:  REPAIR OFCURRENT HIATAL HERNIA, ;  Surgeon: Adolph Pollack, MD;  Location: [facility];  Service: General;  Laterality: N/A;  . Upper gi endoscopy N/A 02/08/2013    Procedure: UPPER GI ENDOSCOPY;  Surgeon: Adolph Pollack, MD;  Location: [facility];  Service: General;  Laterality: N/A;  . Laparoscopic lysis of adhesions N/A 02/08/2013    Procedure: LAPAROSCOPIC LYSIS OF ADHESIONS;  Surgeon: Adolph Pollack, MD;  Location: [facility];  Service: General;  Laterality: N/A;  . Laparoscopic nissen fundoplication  02/08/2013    Procedure: LAPAROSCOPIC NISSEN FUNDOPLICATION;  Surgeon: Adolph Pollack, MD;  Location: [facility];  Service:  General;;   HPI:  79 year old male with a history of a recurrent hiatal hernia and reflux. He was taken to the OR 02/08/13 for an open repair of his hernia with a Nissen fundoplication. He tolerated the operation well and was take to PACU.  While in PACU he developed respiratory distress following extubation with A-fib and RVR. He received multiple agents to control his heart rhythm without success and was re-intubated.  Pt has been intubated 5/27-5/28.  He underwent a gastrograffin study 02/10/13 and aspirated during the procedure.  Pt developed respiratory problems and underwent bronch requring reintubation 5/30-6/1.  Pt underwent sniff test to evaluate diaphragm function, which was found to be negative.  Given hx of GI issues, aspiration during gastrograffin test and current Bell's Palsy, swallow eval was ordered.    Initially critical care service ordered at BSE and surgical MD had ordered MBS and barium swallow per SLP recommendations.  Pt was reticent re: participating in [facility] and had questions re: procedure and SLP conducted BSE in room to assure ready for instrumental evaluation.  Pt has h/o esophageal dysphagia but had recent onset of oral deficits characterized by difficulties forming food boluses and orally transiting since Bell's palsy (April 2014) per pt.  Assessment / Plan / Recommendation Clinical Impression  Dysphagia Diagnosis: Severe cervical esophageal phase dysphagia;Mild oral phase dysphagia;Moderate pharyngeal phase dysphagia  Clinical impression: Pt presents with mild oral, moderate pharyngeal and severe cervical esophageal dysphagia.  Both sensory and motor deficits apparent.  Dysphagia characterized by poor UES opening (suspect decreased laryngeal elevation/closure contributing), decreased epiglottic deflection (decr tongue base retraction) resulting in stasis throughout pharynx that pt does not always sense.  In addition, pt's residuals mixed with secretions.    Head turn left  helped UES opening and subsequently decreased pharyngeal stasis and decreased laryngeal penetration to trace amounts of thin/nectar.  Cued and reflexive dry swallows with head turn decreased oropharyngeal stasis.  Pt did tracely aspirate thin barium with head neutral but cleared with cued cough/throat clear.  In addition, pt with oral stasis that prematurely spilled into pharynx without reflexive swallow to clear.     As pt is cognitively intact and can follow stringent precautions, option to pursue po to determine if he can support himself is recommended.  Intake will be effortful for pt given the number of strategies needed for airway protection.  SLP to continue to follow for dysphagia treatment.      Treatment Recommendation  Therapy as outlined in treatment plan below    Diet Recommendation Thin liquid (consider clear or full liquids initially)   Liquid Administration via: Cup;No straw Medication Administration:  (LIQUIDS) Supervision: Intermittent supervision to cue for compensatory strategies;Patient able to self feed Compensations: Slow rate;Small sips/bites;Multiple dry swallows after each bite/sip;Clear throat intermittently (masticate foods to "mush") Postural Changes and/or Swallow Maneuvers: Head tilt left during swallow    Other  Recommendations Oral Care Recommendations: Oral care BID Other Recommendations: Have oral suction available   Follow Up Recommendations  Inpatient Rehab    Frequency and Duration min 2x/week  2 weeks   Pertinent Vitals/Pain Afebrile, decreased    SLP Swallow Goals Patient will consume recommended diet without observed clinical signs of aspiration with: Supervision/safety Swallow Study Goal #3 - Progress: Progressing toward goal Swallow Study Goal #4 - Progress: Progressing toward goal   General Date of Onset: 02/08/13 HPI: 79 year old male with a history of a recurrent hiatal hernia and reflux. He was taken to the OR 02/08/13 for an open repair  of his hernia with a Nissen fundoplication. He tolerated the operation well and was take to PACU.  While in PACU he developed respiratory distress following extubation with A-fib and RVR. He received multiple agents to control his heart rhythm without success and was re-intubated.  Pt has been intubated 5/27-5/28.  He underwent a gastrograffin study 02/10/13 and aspirated during the procedure.  Pt developed respiratory problems and underwent bronch requring reintubation 5/30-6/1.  Pt underwent sniff test to evaluate diaphragm function, which was found to be negative.  Given hx of GI issues, aspiration during gastrograffin test and current Bell's Palsy, swallow eval was ordered.    Initially critical care service ordered at BSE and surgical MD had ordered MBS and barium swallow per SLP recommendations.  Pt was reticent re: participating in [facility] and had questions re: procedure and SLP conducted BSE in room to assure ready for instrumental evaluation.  Pt has h/o esophageal dysphagia but had recent onset of oral deficits characterized by difficulties forming food boluses and orally transiting since Bell's palsy (April 2014) per pt.   Type of Study: Modified Barium Swallowing Study Reason for Referral: Objectively evaluate swallowing function Diet Prior to this Study: NPO;TNA Temperature Spikes Noted:  No Respiratory Status:  (2 liters) History of Recent Intubation: Yes Length of Intubations (days): 5 days Date extubated: 02/13/13 Behavior/Cognition: Alert;Cooperative;Pleasant mood Oral Cavity - Dentition: Adequate natural dentition Oral Motor / Sensory Function: Impaired - see Bedside swallow eval Self-Feeding Abilities: Able to feed self Patient Positioning: Upright in chair Baseline Vocal Quality: Wet;Hoarse;Low vocal intensity Volitional Cough: Strong Volitional Swallow: Able to elicit Anatomy: Within functional limits Pharyngeal Secretions: Standing secretions in (comment) (t/o pharynx)    Reason  for Referral Objectively evaluate swallowing function   Oral Phase Oral Preparation/Oral Phase Oral Phase: Impaired Oral - Nectar Oral - Nectar Teaspoon: Weak lingual manipulation;Reduced posterior propulsion;Piecemeal swallowing;Lingual/palatal residue Oral - Nectar Cup: Weak lingual manipulation;Reduced posterior propulsion;Piecemeal swallowing Oral - Thin Oral - Thin Teaspoon: Weak lingual manipulation;Reduced posterior propulsion;Piecemeal swallowing Oral - Thin Cup: Weak lingual manipulation;Reduced posterior propulsion;Piecemeal swallowing Oral - Solids Oral - Puree: Weak lingual manipulation;Reduced posterior propulsion;Piecemeal swallowing;Lingual/palatal residue Oral - Regular: Weak lingual manipulation;Reduced posterior propulsion;Piecemeal swallowing;Lingual/palatal residue   Pharyngeal Phase Pharyngeal Phase Pharyngeal Phase: Impaired Pharyngeal - Nectar Pharyngeal - Nectar Teaspoon: Reduced epiglottic inversion;Reduced anterior laryngeal mobility;Reduced laryngeal elevation;Reduced airway/laryngeal closure;Reduced tongue base retraction;Penetration/Aspiration during swallow;Pharyngeal residue - pyriform sinuses;Pharyngeal residue - valleculae;Pharyngeal residue - cp segment;Penetration/Aspiration after swallow Penetration/Aspiration details (nectar teaspoon): Material enters airway, CONTACTS cords and not ejected out Pharyngeal - Nectar Cup: Reduced epiglottic inversion;Reduced anterior laryngeal mobility;Reduced pharyngeal peristalsis;Reduced laryngeal elevation;Reduced airway/laryngeal closure;Reduced tongue base retraction;Penetration/Aspiration during swallow;Penetration/Aspiration after swallow;Pharyngeal residue - cp segment Penetration/Aspiration details (nectar cup): Material enters airway, CONTACTS cords then ejected out Pharyngeal - Thin Pharyngeal - Thin Teaspoon: Reduced epiglottic inversion;Reduced anterior laryngeal mobility;Reduced laryngeal elevation;Reduced  airway/laryngeal closure;Reduced tongue base retraction;Penetration/Aspiration during swallow;Penetration/Aspiration after swallow;Pharyngeal residue - valleculae;Pharyngeal residue - pyriform sinuses;Pharyngeal residue - cp segment Penetration/Aspiration details (thin teaspoon): Material enters airway, CONTACTS cords and not ejected out Pharyngeal - Thin Cup: Pharyngeal residue - cp segment;Pharyngeal residue - valleculae;Penetration/Aspiration during swallow;Pharyngeal residue - pyriform sinuses;Reduced epiglottic inversion;Reduced anterior laryngeal mobility;Reduced laryngeal elevation;Reduced airway/laryngeal closure;Reduced tongue base retraction Penetration/Aspiration details (thin cup): Material enters airway, passes BELOW cords without attempt by patient to eject out (silent aspiration) Pharyngeal - Solids Pharyngeal - Puree: Reduced epiglottic inversion;Reduced anterior laryngeal mobility;Reduced tongue base retraction;Reduced laryngeal elevation;Reduced airway/laryngeal closure;Pharyngeal residue - valleculae;Pharyngeal residue - pyriform sinuses Pharyngeal - Regular: Reduced epiglottic inversion;Reduced anterior laryngeal mobility;Reduced laryngeal elevation;Reduced airway/laryngeal closure;Reduced tongue base retraction;Pharyngeal residue - valleculae Pharyngeal Phase - Comment Pharyngeal Comment: head turn left facilitated clearance, dry swallows decreased stasis, cued cough/throat removed trace aspirates/penetrates  Cervical Esophageal Phase    GO    Cervical Esophageal Phase Cervical Esophageal Phase: Impaired Cervical Esophageal Phase - Nectar Nectar Teaspoon: Reduced cricopharyngeal relaxation;Esophageal backflow into the pharynx;Prominent cricopharyngeal segment Nectar Cup: Reduced cricopharyngeal relaxation;Esophageal backflow into the pharynx;Prominent cricopharyngeal segment Cervical Esophageal Phase - Thin Thin Teaspoon: Reduced cricopharyngeal relaxation;Esophageal  backflow into the pharynx;Prominent cricopharyngeal segment Thin Cup: Reduced cricopharyngeal relaxation;Esophageal backflow into the pharynx;Prominent cricopharyngeal segment Cervical Esophageal Phase - Solids Puree: Reduced cricopharyngeal relaxation;Esophageal backflow into the pharynx Regular: Reduced cricopharyngeal relaxation;Esophageal backflow into the pharynx Cervical Esophageal Phase - Comment Cervical Esophageal Comment: decreased UES opening contributing significant to pharyngeal stasis Clearance of esophagus below UES appeared adequate, no radiologist present to confirm        [name], MS [facility] SLP [phone]

## 2013-02-21 NOTE — Progress Notes (Deleted)
Rehab admissions - I met with patient and his wife this am.  Patient is HOH.  Wife and patient open to inpatient rehab admission.  I will ask Dr. Riley Kill to review case and consider inpatient rehab admission.  I will follow up then.  I will need an OT consult with recommendations and I will need [facility] insurance approval if we want to proceed with inpatient rehab admission.  Call me for questions.  #[phone]

## 2013-02-21 NOTE — Progress Notes (Signed)
PARENTERAL NUTRITION CONSULT NOTE - FOLLOW UP  Pharmacy Consult for TPN Indication: dysphagia with Bell's Palsy, aspiration s/p hernia repair/Nissen fundoplication 5/27  No Known Allergies  Patient Measurements: Height: 5\' 10"  (177.8 cm) Weight: 183 lb 6.8 oz (83.2 kg) IBW/kg (Calculated) : 73  Vital Signs: Temp: 97.8 F (36.6 C) (06/09 0600) Temp src: Oral (06/09 0600) BP: 122/77 mmHg (06/09 0600) Pulse Rate: 86 (06/09 0600) Intake/Output from previous day: 06/08 0701 - 06/09 0700 In: 2502.9 [I.V.:486; TPN:2016.9] Out: 2125 [Urine:2125]  Labs:  Recent Labs  02/21/13 0520  WBC 7.5  HGB 13.5  HCT 41.6  PLT 397    Recent Labs  02/19/13 0433 02/20/13 0430 02/21/13 0520  NA 140 140 139  K 3.8 3.4* 3.9  CL 102 103 104  CO2 31 31 30   GLUCOSE 163* 126* 114*  BUN 38* 37* 36*  CREATININE 0.88 0.84 0.86  CALCIUM 9.3 9.4 9.5  MG  --  2.5 2.5  PHOS  --  3.6 3.2  PROT  --   --  6.9  ALBUMIN  --   --  2.5*  AST  --   --  69*  ALT  --   --  88*  ALKPHOS  --   --  100  BILITOT  --   --  0.7  TRIG  --   --  79  Corr Ca = 10.46 (6/5) Estimated Creatinine Clearance: 68.4 ml/min (by C-G formula based on Cr of 0.86).    Recent Labs  02/20/13 1759 02/20/13 2356 02/21/13 0609  GLUCAP 130* 123* 122*   Medications:  Scheduled:  . antiseptic oral rinse  15 mL Mouth Rinse QID  . chlorhexidine  15 mL Mouth Rinse BID  . furosemide  40 mg Intravenous Daily  . heparin subcutaneous  5,000 Units Subcutaneous Q8H  . insulin aspart  0-9 Units Subcutaneous Q6H  . pantoprazole (PROTONIX) IV  40 mg Intravenous QHS   Infusions:  . Marland KitchenTPN (CLINIMIX-E) Adult 83 mL/hr at 02/20/13 1808  . sodium chloride 20 mL/hr at 02/18/13 0353    Nutritional Goals:   RD recs (5/30): 1980-2310 Kcal, 110-128 g Protein, 2.5 L fluid  Clinimix 5/20 at 83 ml/hr + Lipids MWF to provide 100 g protein/day and an average of  1959 Kcal/day.   Current nutrition:   Diet: NPO  IVF: NS at  [facility]  Clinimix E 5/20 at 83 ml/hr  Lipids 20% Daily  CBGs & Insulin requirements past 24 hours:   CBGs ordered q6h:  Within goal of < 150 mg/dl  Sensitive SSI :  2 YNWGN/56O  Assessment:  79 yo M admit 5/27 s/p laparoscopic hernia repair and Nissen fundoplication on 5/27.  The pt had aspiration during UGI swallow evaluation on 5/29.  He has a history of poor PO intake prior to admission and has severe malnutrition (40lb wt loss PTA).  IV team unable to place PICC and  CCM placed central line on 5/30 (R IJ), plan PICC if fails swallow study  6/9:  Day #11 TPN.  TPN at goal and tolerating thus far. To retry PICC today  Bedside swallow study: severe aspiration, deferred Barium swallow for Monday per SLP-but cancelled by CCS yesterday  Renal function:  SCr is stable/WNL, good UOP with ~equal fluid balance (on lasix 40mg  IV now q24 hr)  Hepatic function: AST/ALT elevated but improved from last week, Tbili was elevated, now improved to WNL  Electrolytes: K improved and now WNL   Mg/Phos WNL  and stable  Pre-Albumin: 8.9 (5/31), 9.4 (6/2)  TG/Cholesterol: WNL (5/31), 106 (6/2)  Glucose:  Within goal range (< 150 mg/dl)  Plan:  Continue Clinimix E 5/20 to goal 83 ml/hr tonight  Fat emulsion at 10 ml/hr to begin daily 6/9 (shortage abated for now) TNA to contain standard multivitamins and trace elements daily from 6/9 Continue sensitive SSI q6h TNA lab panels on Mondays & Thursdays. Discontinued Cholesterol monitoring Noted plan for patient to get PICC today   Hessie Knows, PharmD, BCPS Pager [phone] 02/21/2013 10:00 AM

## 2013-02-21 NOTE — Progress Notes (Signed)
PT Cancellation Note  ___Treatment cancelled today due to medical issues with patient which prohibited therapy  ___ Treatment cancelled today due to patient receiving procedure or test   ___ Treatment cancelled today due to patient's refusal to participate   _X_ Treatment cancelled today due to decline in am c/o freq urination from lasix then pm out of room for MBSS  Felecia Shelling  PTA [facility]  Acute  Rehab Pager      [phone]

## 2013-02-21 NOTE — Progress Notes (Signed)
MBS results and Speech Therapist's recommendations noted.  Will try clear liquids with precautions as outlined by Speech Therapist.

## 2013-02-21 NOTE — Progress Notes (Signed)
13 Days Post-Op  Subjective: Feels he is swallowing a little better. Speech therapist agrees.  Objective: Vital signs in last 24 hours: Temp:  [97.8 F (36.6 C)-98.7 F (37.1 C)] 97.8 F (36.6 C) (06/09 0600) Pulse Rate:  [86-98] 90 (06/09 1005) Resp:  [16-18] 18 (06/09 0600) BP: (117-131)/(69-79) 117/79 mmHg (06/09 1005) SpO2:  [96 %-97 %] 96 % (06/09 1000) Weight:  [183 lb 6.8 oz (83.2 kg)] 183 lb 6.8 oz (83.2 kg) (06/09 0500) Last BM Date: 02/20/13  Intake/Output from previous day: 06/08 0701 - 06/09 0700 In: 2502.9 [I.V.:486; TPN:2016.9] Out: 2125 [Urine:2125] Intake/Output this shift: Total I/O In: 381.1 [I.V.:74; TPN:307.1] Out: 100 [Urine:100]  PE: General- In NAD CV-RRR Lungs-clear Abdomen-soft, incisions are clean and intact  Lab Results:   Recent Labs  02/21/13 0520  WBC 7.5  HGB 13.5  HCT 41.6  PLT 397   BMET  Recent Labs  02/20/13 0430 02/21/13 0520  NA 140 139  K 3.4* 3.9  CL 103 104  CO2 31 30  GLUCOSE 126* 114*  BUN 37* 36*  CREATININE 0.84 0.86  CALCIUM 9.4 9.5   PT/INR No results found for this basename: LABPROT, INR,  in the last 72 hours Comprehensive Metabolic Panel:    Component Value Date/Time   NA 139 02/21/2013 0520   K 3.9 02/21/2013 0520   CL 104 02/21/2013 0520   CO2 30 02/21/2013 0520   BUN 36* 02/21/2013 0520   CREATININE 0.86 02/21/2013 0520   GLUCOSE 114* 02/21/2013 0520   CALCIUM 9.5 02/21/2013 0520   AST 69* 02/21/2013 0520   ALT 88* 02/21/2013 0520   ALKPHOS 100 02/21/2013 0520   BILITOT 0.7 02/21/2013 0520   PROT 6.9 02/21/2013 0520   ALBUMIN 2.5* 02/21/2013 0520     Studies/Results: No results found.  Anti-infectives: Anti-infectives   Start     Dose/Rate Route Frequency Ordered Stop   02/14/13 1400  cefTRIAXone (ROCEPHIN) 2 g in dextrose 5 % 50 mL IVPB     2 g 100 mL/hr over 30 Minutes Intravenous Every 24 hours 02/14/13 1143 02/18/13 1512   02/12/13 1000  vancomycin (VANCOCIN) IVPB 1000 mg/200 mL premix  Status:   Discontinued     1,000 mg 200 mL/hr over 60 Minutes Intravenous Every 12 hours 02/12/13 0943 02/14/13 1137   02/11/13 2300  piperacillin-tazobactam (ZOSYN) IVPB 3.375 g  Status:  Discontinued     3.375 g 12.5 mL/hr over 240 Minutes Intravenous Every 8 hours 02/11/13 2156 02/14/13 1144   02/11/13 2300  vancomycin (VANCOCIN) IVPB 750 mg/150 ml premix  Status:  Discontinued     750 mg 150 mL/hr over 60 Minutes Intravenous Every 12 hours 02/11/13 2156 02/12/13 0943   02/11/13 2230  micafungin (MYCAMINE) 100 mg in sodium chloride 0.9 % 100 mL IVPB  Status:  Discontinued     100 mg 100 mL/hr over 1 Hours Intravenous Daily at bedtime 02/11/13 2137 02/13/13 1023   02/08/13 1700  ceFAZolin (ANCEF) IVPB 1 g/50 mL premix     1 g 100 mL/hr over 30 Minutes Intravenous Every 6 hours 02/08/13 1639 02/09/13 0539   02/08/13 0650  ceFAZolin (ANCEF) IVPB 2 g/50 mL premix     2 g 100 mL/hr over 30 Minutes Intravenous On call to O.R. 02/08/13 0650 02/08/13 0957      Assessment Principal Problem:   Hiatal hernia-recurrent s/p laparoscopic repair and Nissen fundoplication on 02/08/13 Active Problems:   Bell's palsy   Atrial fibrillation-resolved  Acute respiratory failure-resolved   Protein-calorie malnutrition, severe on TPN   Aspiration into airway with pneumonia-slowly improving    LOS: 13 days   Plan: MBS today.  If he does not pass this then will need a PEG.   Victor Sutton J 02/21/2013

## 2013-02-21 NOTE — Progress Notes (Addendum)
Speech Language Pathology Dysphagia Treatment Patient Details Name: Victor Sutton MRN: [id number] DOB: September 24, 1927 Today's Date: 02/21/2013 Time: 4098-1191 SLP Time Calculation (min): 22 min  Assessment / Plan / Recommendation Clinical Impression  Pt seen today for clinical assessment to determine readiness for instrumental swallow evaluation.  Vocal quality much improved compared to Friday's SLP visit, spouse states voice is near baseline at this time.  Consumption of ice chips observed with no overt clinical indicators of aspiration.  Occasional multiple swallows noted which may indicate pharyngeal stasis.  Minimal increased wet vocal quality with intake possible.      Pt subjectively reports improved swallow ability.  He appears to be ready for instrumental assessment of swallow to determine readiness for po advancement, -both pt and spouse agree.    Educated pt and spouse to procedure of MBS and time scheduled.    MBS planned today per SLP conversation with Dr Abbey Chatters.      Diet Recommendation  Continue with Current Diet: NPO (ice chips)    SLP Plan MBS today at 1300  Pertinent Vitals/Pain Afebrile, decreased   Swallowing Goals  SLP Swallowing Goals Swallow Study Goal #3 - Progress: Progressing toward goal Swallow Study Goal #4 - Progress: Progressing toward goal  General Temperature Spikes Noted: No Respiratory Status: Room air Behavior/Cognition: Alert;Cooperative;Pleasant mood Oral Cavity - Dentition: Adequate natural dentition Patient Positioning: Upright in chair  Oral Cavity - Oral Hygiene     Dysphagia Treatment Treatment focused on: Patient/family/caregiver education;Facilitation of pharyngeal phase Family/Caregiver Educated: wife Treatment Methods/Modalities: Skilled observation;Differential diagnosis Patient observed directly with PO's: Yes Type of PO's observed: Ice chips Feeding: Able to feed self Liquids provided via: Teaspoon Pharyngeal Phase Signs &  Symptoms: Multiple swallows Type of cueing: Verbal Amount of cueing: Minimal   GO     [name], MS [facility] SLP [phone]

## 2013-02-21 NOTE — Evaluation (Signed)
Occupational Therapy Evaluation Patient Details Name: Victor Sutton MRN: [id number] DOB: [birth year] Today's Date: 02/21/2013 Time: 7829-5621 OT Time Calculation (min): 22 min  OT Assessment / Plan / Recommendation Clinical Impression  79 yo male admitted 02/08/13 for repair of hiatal hernia. He is overall at min guard assist level with ADL. He will benefit from continued OT services to improve ADL independence for next level of care.     OT Assessment  Patient needs continued OT Services    Follow Up Recommendations  Home health OT;Supervision/Assistance - 24 hour (if wife able to help with meals/household tasks)    Barriers to Discharge      Equipment Recommendations  None recommended by OT    Recommendations for Other Services    Frequency  Min 2X/week    Precautions / Restrictions Precautions Precautions: Fall Precaution Comments: monitor sats. Restrictions Weight Bearing Restrictions: No        ADL  Eating/Feeding:  (Pt is NPO) Grooming: Wash/dry hands;Min guard Where Assessed - Grooming: Unsupported standing Upper Body Bathing: Chest;Right arm;Left arm;Abdomen;Set up Where Assessed - Upper Body Bathing: Unsupported sitting Lower Body Bathing: Min guard Where Assessed - Lower Body Bathing: Supported sit to stand (from toilet with grab bar) Upper Body Dressing: Minimal assistance (only due to lines) Where Assessed - Upper Body Dressing: Unsupported sitting Lower Body Dressing: Min guard Where Assessed - Lower Body Dressing: Supported sit to stand Toilet Transfer: Hydrographic surveyor Method: Sit to Barista: Comfort height toilet;Grab bars Toileting - Architect and Hygiene: Min guard Where Assessed - Engineer, mining and Hygiene: Sit to stand from 3-in-1 or toilet Equipment Used: Rolling walker ADL Comments: Required min cues to continue holding RW until he has fully back up to the recliner. Wife present at  end of session. He was already in bathroom when OT arrived sitting on toilet. Pt stood from toilet with grab bar with min guard assist but states his toilet is lower at home but he does have a 3in1. He was able to toilet, perform hygiene and groom at the sink and then transfer to chair with good tolerance. He states he feels tired from "not sleeping well." Sats were 96% on RA after activity. Discussed energy conservation strategies to use PRN including sitting on shower chair to bathe and 3in1 to raise toilet.     OT Diagnosis: Generalized weakness  OT Problem List: Decreased strength;Decreased knowledge of use of DME or AE OT Treatment Interventions: Self-care/ADL training;DME and/or AE instruction;Patient/family education;Therapeutic activities   OT Goals Acute Rehab OT Goals OT Goal Formulation: With patient Time For Goal Achievement: 03/07/13 Potential to Achieve Goals: Good ADL Goals Pt Will Perform Grooming: with supervision;Standing at sink (3 tasks with no rest break) ADL Goal: Grooming - Progress: Goal set today Pt Will Transfer to Toilet: with supervision;Ambulation;3-in-1 ADL Goal: Toilet Transfer - Progress: Goal set today Pt Will Perform Toileting - Clothing Manipulation: with supervision;Standing ADL Goal: Toileting - Clothing Manipulation - Progress: Goal set today Pt Will Perform Tub/Shower Transfer: Shower transfer;with supervision ADL Goal: Web designer - Progress: Goal set today Additional ADL Goal #1: Pt will gather clothing with RW and perform basic B/D with supervision sit to stand level.  ADL Goal: Additional Goal #1 - Progress: Goal set today  Visit Information  Last OT Received On: 02/21/13 Assistance Needed: +1    Subjective Data  Subjective: I was very independent at home Patient Stated Goal: wants to return to independent level  Prior Functioning     Home Living Lives With: Spouse Available Help at Discharge: Family Type of Home: House Home  Access: Stairs to enter Secretary/administrator of Steps: 3 Entrance Stairs-Rails: None Home Layout: One level Bathroom Shower/Tub: Health visitor: Standard Home Adaptive Equipment: Bedside commode/3-in-1;Walker - rolling;Shower chair with back Prior Function Level of Independence: Independent Able to Take Stairs?: Yes Vocation: Retired Musician: HOH         Vision/Perception     Copywriter, advertising Arousal/Alertness: Awake/alert Behavior During Therapy: WFL for tasks assessed/performed Overall Cognitive Status: Within Functional Limits for tasks assessed    Extremity/Trunk Assessment Right Upper Extremity Assessment RUE ROM/Strength/Tone: [facility] for tasks assessed Left Upper Extremity Assessment LUE ROM/Strength/Tone: WFL for tasks assessed     Mobility Transfers Transfers: Sit to Stand;Stand to Sit Sit to Stand: 4: Min guard;With upper extremity assist;From toilet;Other (comment) (grab bar) Stand to Sit: 4: Min guard;With upper extremity assist;To chair/3-in-1 Details for Transfer Assistance: min verbal cues to back all the way up to recliner before letting go of RW     Exercise     Balance Balance Balance Assessed: Yes Static Standing Balance Static Standing - Balance Support: No upper extremity supported Static Standing - Level of Assistance: 5: Stand by assistance   End of Session OT - End of Session Activity Tolerance: Patient tolerated treatment well Patient left: in chair;with call bell/phone within reach  GO     Lennox Laity [phone] 02/21/2013, 10:38 AM

## 2013-02-22 LAB — COMPREHENSIVE METABOLIC PANEL
ALT: 89 U/L — ABNORMAL HIGH (ref 0–53)
AST: 73 U/L — ABNORMAL HIGH (ref 0–37)
Albumin: 2.5 g/dL — ABNORMAL LOW (ref 3.5–5.2)
Alkaline Phosphatase: 102 U/L (ref 39–117)
BUN: 37 mg/dL — ABNORMAL HIGH (ref 6–23)
CO2: 29 mEq/L (ref 19–32)
Calcium: 9.4 mg/dL (ref 8.4–10.5)
Chloride: 102 mEq/L (ref 96–112)
Creatinine, Ser: 0.87 mg/dL (ref 0.50–1.35)
GFR calc Af Amer: >90 mL/min (ref >90)
GFR calc non Af Amer: 78 mL/min — ABNORMAL LOW (ref >90)
Glucose, Bld: 120 mg/dL — ABNORMAL HIGH (ref 70–99)
Potassium: 3.7 mEq/L (ref 3.5–5.1)
Sodium: 138 mEq/L (ref 135–145)
Total Bilirubin: 0.6 mg/dL (ref 0.3–1.2)
Total Protein: 7 g/dL (ref 6.0–8.3)

## 2013-02-22 LAB — GLUCOSE, CAPILLARY
Glucose-Capillary: 130 mg/dL — ABNORMAL HIGH (ref 70–99)
Glucose-Capillary: 141 mg/dL — ABNORMAL HIGH (ref 70–99)
Glucose-Capillary: 125 mg/dL — ABNORMAL HIGH (ref 70–99)
Glucose-Capillary: 111 mg/dL — ABNORMAL HIGH (ref 70–99)
Glucose-Capillary: 112 mg/dL — ABNORMAL HIGH (ref 70–99)

## 2013-02-22 MED ORDER — FAT EMULSION 20 % IV EMUL
250.0000 mL | INTRAVENOUS | Status: DC
  Administered 2013-02-22: 250 mL via INTRAVENOUS
  Filled 2013-02-22: qty 250

## 2013-02-22 MED ORDER — INSULIN ASPART 100 UNIT/ML ~~LOC~~ SOLN
0.0000 [IU] | Freq: Three times a day (TID) | SUBCUTANEOUS | Status: DC
  Administered 2013-02-22: 1 [IU] via SUBCUTANEOUS

## 2013-02-22 MED ORDER — INSULIN ASPART 100 UNIT/ML ~~LOC~~ SOLN: 0.0000 [IU] | Freq: Every day | SUBCUTANEOUS | Status: DC

## 2013-02-22 MED ORDER — TRACE MINERALS CR-CU-F-FE-I-MN-MO-SE-ZN IV SOLN
INTRAVENOUS | Status: DC
  Administered 2013-02-22: 17:00:00 via INTRAVENOUS
  Filled 2013-02-22: qty 2000

## 2013-02-22 NOTE — Progress Notes (Signed)
OT Cancellation Note  Patient Details Name: Victor Sutton MRN: [id number] DOB: [birth year]   Cancelled Treatment:    Reason Eval/Treat Not Completed: Other (comment) (Not a good time for therapy. Pt focused on d/c plan.)  Lennox Laity [phone] 02/22/2013, 12:29 PM

## 2013-02-22 NOTE — Progress Notes (Signed)
EKG complete copy in chart. Lurena Joiner, RN

## 2013-02-22 NOTE — Progress Notes (Signed)
14 Days Post-Op  Subjective: Swallowing clear liquids without coughing or choking.  Objective: Vital signs in last 24 hours: Temp:  [97.5 F (36.4 C)-98 F (36.7 C)] 98 F (36.7 C) (06/10 0535) Pulse Rate:  [88-99] 88 (06/10 0535) Resp:  [16-18] 16 (06/10 0535) BP: (117-136)/(77-86) 130/79 mmHg (06/10 0535) SpO2:  [94 %-96 %] 94 % (06/10 0535) Last BM Date: 02/20/13  Intake/Output from previous day: 06/09 0701 - 06/10 0700 In: 2401.6 [I.V.:466.3; ZOX:0960.4] Out: 2295 [Urine:2295] Intake/Output this shift:    PE: General- In NAD Abdomen-soft, incisions are clean and intact  Lab Results:   Recent Labs  02/21/13 0520  WBC 7.5  HGB 13.5  HCT 41.6  PLT 397   BMET  Recent Labs  02/21/13 0520 02/22/13 0554  NA 139 138  K 3.9 3.7  CL 104 102  CO2 30 29  GLUCOSE 114* 120*  BUN 36* 37*  CREATININE 0.86 0.87  CALCIUM 9.5 9.4   PT/INR No results found for this basename: LABPROT, INR,  in the last 72 hours Comprehensive Metabolic Panel:    Component Value Date/Time   NA 138 02/22/2013 0554   K 3.7 02/22/2013 0554   CL 102 02/22/2013 0554   CO2 29 02/22/2013 0554   BUN 37* 02/22/2013 0554   CREATININE 0.87 02/22/2013 0554   GLUCOSE 120* 02/22/2013 0554   CALCIUM 9.4 02/22/2013 0554   AST 73* 02/22/2013 0554   ALT 89* 02/22/2013 0554   ALKPHOS 102 02/22/2013 0554   BILITOT 0.6 02/22/2013 0554   PROT 7.0 02/22/2013 0554   ALBUMIN 2.5* 02/22/2013 0554     Studies/Results: Dg Swallowing Func-speech Pathology  02/21/2013   Chales Abrahams, CCC-SLP     02/21/2013  2:26 PM Objective Swallowing Evaluation: Modified Barium Swallowing Study   Patient Details  Name: Victor Sutton MRN: [id number] Date of Birth: May 04, 1928  Today's Date: 02/21/2013 Time: 4782-9562 SLP Time Calculation (min): 42 min  Past Medical History:  Past Medical History  Diagnosis Date  . BPH (benign prostatic hyperplasia)   . Macular degeneration     (L) wet, (R) dry  . B12 deficiency     HIstory  .  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 Leukoplakia 2001    Treated for  . Hypertension   . Iron deficiency anemia   . Cataract 2009    bilateral cataract surgery  . GERD (gastroesophageal reflux disease)   . Hyperlipidemia     not on medication  . Hiatal hernia   . Peripheral neuropathy     Mild History of  . Ulcer   . Bell's palsy     at present   Past Surgical History:  Past Surgical History  Procedure Laterality Date  . Cholecystectomy    . Arthroscopic knee surgery      left  . Multiple oral surgeries  2002  . Tonsillectomy    . Cateract surgeries       bilateral  . Stomach surgery  2005    states his stomach had moved up toward his chest , some type of  surgery performed  . Hernia repair      Laparoscopic ventral  . Hiatal hernia repair  2004  . Skin cancer excision  10/2012    right leg-shin area  . Hiatal hernia repair N/A 02/08/2013    Procedure:  REPAIR OFCURRENT HIATAL HERNIA, ;  Surgeon: Adolph Pollack, MD;  Location: [facility];  Service: General;   Laterality:  N/A;  . Upper gi endoscopy N/A 02/08/2013    Procedure: UPPER GI ENDOSCOPY;  Surgeon: Adolph Pollack, MD;   Location: [facility];  Service: General;  Laterality: N/A;  . Laparoscopic lysis of adhesions N/A 02/08/2013    Procedure: LAPAROSCOPIC LYSIS OF ADHESIONS;  Surgeon: Adolph Pollack, MD;  Location: [facility];  Service: General;   Laterality: N/A;  . Laparoscopic nissen fundoplication  02/08/2013    Procedure: LAPAROSCOPIC NISSEN FUNDOPLICATION;  Surgeon: Adolph Pollack, MD;  Location: [facility];  Service: General;;   HPI:  79 year old male with a history of a recurrent hiatal hernia and  reflux. He was taken to the OR 02/08/13 for an open repair of his  hernia with a Nissen fundoplication. He tolerated the operation  well and was take to PACU.  While in PACU he developed  respiratory distress following extubation with A-fib and RVR. He  received multiple agents to control his heart rhythm without  success and was re-intubated.  Pt has been intubated  5/27-5/28.   He underwent a gastrograffin study 02/10/13 and aspirated during  the procedure.  Pt developed respiratory problems and underwent  bronch requring reintubation 5/30-6/1.  Pt underwent sniff test  to evaluate diaphragm function, which was found to be negative.   Given hx of GI issues, aspiration during gastrograffin test and  current Bell's Palsy, swallow eval was ordered.    Initially  critical care service ordered at [facility] and surgical MD had ordered  MBS and barium swallow per SLP recommendations.  Pt was reticent  re: participating in [facility] and had questions re: procedure and SLP  conducted BSE in room to assure ready for instrumental  evaluation.  Pt has h/o esophageal dysphagia but had recent onset  of oral deficits characterized by difficulties forming food  boluses and orally transiting since Bell's palsy (April 2014) per  pt.       Assessment / Plan / Recommendation Clinical Impression  Dysphagia Diagnosis: Severe cervical esophageal phase  dysphagia;Mild oral phase dysphagia;Moderate pharyngeal phase  dysphagia  Clinical impression: Pt presents with mild oral, moderate  pharyngeal and severe cervical esophageal dysphagia.  Both  sensory and motor deficits apparent.  Dysphagia characterized by  poor UES opening (suspect decreased laryngeal elevation/closure  contributing), decreased epiglottic deflection (decr tongue base  retraction) resulting in stasis throughout pharynx that pt does  not always sense.  In addition, pt's residuals mixed with  secretions.    Head turn left helped UES opening and subsequently decreased  pharyngeal stasis and decreased laryngeal penetration to trace  amounts of thin/nectar.  Cued and reflexive dry swallows with  head turn decreased oropharyngeal stasis.  Pt did tracely  aspirate thin barium with head neutral but cleared with cued  cough/throat clear.  In addition, pt with oral stasis that  prematurely spilled into pharynx without reflexive swallow to  clear.     As pt  is cognitively intact and can follow stringent precautions,  option to pursue po to determine if he can support himself is  recommended.  Intake will be effortful for pt given the number of  strategies needed for airway protection.  SLP to continue to  follow for dysphagia treatment.      Treatment Recommendation  Therapy as outlined in treatment plan below    Diet Recommendation Thin liquid (consider clear or full liquids  initially)   Liquid Administration via: Cup;No straw Medication Administration:  (LIQUIDS) Supervision: Intermittent supervision to cue  for compensatory  strategies;Patient able to self feed Compensations: Slow rate;Small sips/bites;Multiple dry swallows  after each bite/sip;Clear throat intermittently (masticate foods  to "mush") Postural Changes and/or Swallow Maneuvers: Head tilt left during  swallow    Other  Recommendations Oral Care Recommendations: Oral care BID Other Recommendations: Have oral suction available   Follow Up Recommendations  Inpatient Rehab    Frequency and Duration min 2x/week  2 weeks   Pertinent Vitals/Pain Afebrile, decreased    SLP Swallow Goals Patient will consume recommended diet without observed clinical  signs of aspiration with: Supervision/safety Swallow Study Goal #3 - Progress: Progressing toward goal Swallow Study Goal #4 - Progress: Progressing toward goal   General Date of Onset: 02/08/13 HPI: 79 year old male with a history of a recurrent hiatal hernia  and reflux. He was taken to the OR 02/08/13 for an open repair of  his hernia with a Nissen fundoplication. He tolerated the  operation well and was take to PACU.  While in PACU he developed  respiratory distress following extubation with A-fib and RVR. He  received multiple agents to control his heart rhythm without  success and was re-intubated.  Pt has been intubated 5/27-5/28.   He underwent a gastrograffin study 02/10/13 and aspirated during  the procedure.  Pt developed respiratory problems and  underwent  bronch requring reintubation 5/30-6/1.  Pt underwent sniff test  to evaluate diaphragm function, which was found to be negative.   Given hx of GI issues, aspiration during gastrograffin test and  current Bell's Palsy, swallow eval was ordered.    Initially  critical care service ordered at BSE and surgical MD had ordered  MBS and barium swallow per SLP recommendations.  Pt was reticent  re: participating in [facility] and had questions re: procedure and SLP  conducted BSE in room to assure ready for instrumental  evaluation.  Pt has h/o esophageal dysphagia but had recent onset  of oral deficits characterized by difficulties forming food  boluses and orally transiting since Bell's palsy (April 2014) per  pt.   Type of Study: Modified Barium Swallowing Study Reason for Referral: Objectively evaluate swallowing function Diet Prior to this Study: NPO;TNA Temperature Spikes Noted: No Respiratory Status:  (2 liters) History of Recent Intubation: Yes Length of Intubations (days): 5 days Date extubated: 02/13/13 Behavior/Cognition: Alert;Cooperative;Pleasant mood Oral Cavity - Dentition: Adequate natural dentition Oral Motor / Sensory Function: Impaired - see Bedside swallow  eval Self-Feeding Abilities: Able to feed self Patient Positioning: Upright in chair Baseline Vocal Quality: Wet;Hoarse;Low vocal intensity Volitional Cough: Strong Volitional Swallow: Able to elicit Anatomy: Within functional limits Pharyngeal Secretions: Standing secretions in (comment) (t/o  pharynx)    Reason for Referral Objectively evaluate swallowing function   Oral Phase Oral Preparation/Oral Phase Oral Phase: Impaired Oral - Nectar Oral - Nectar Teaspoon: Weak lingual manipulation;Reduced  posterior propulsion;Piecemeal swallowing;Lingual/palatal residue Oral - Nectar Cup: Weak lingual manipulation;Reduced posterior  propulsion;Piecemeal swallowing Oral - Thin Oral - Thin Teaspoon: Weak lingual manipulation;Reduced posterior   propulsion;Piecemeal swallowing Oral - Thin Cup: Weak lingual manipulation;Reduced posterior  propulsion;Piecemeal swallowing Oral - Solids Oral - Puree: Weak lingual manipulation;Reduced posterior  propulsion;Piecemeal swallowing;Lingual/palatal residue Oral - Regular: Weak lingual manipulation;Reduced posterior  propulsion;Piecemeal swallowing;Lingual/palatal residue   Pharyngeal Phase Pharyngeal Phase Pharyngeal Phase: Impaired Pharyngeal - Nectar Pharyngeal - Nectar Teaspoon: Reduced epiglottic  inversion;Reduced anterior laryngeal mobility;Reduced laryngeal  elevation;Reduced airway/laryngeal closure;Reduced tongue base  retraction;Penetration/Aspiration during swallow;Pharyngeal  residue - pyriform sinuses;Pharyngeal residue -  valleculae;Pharyngeal residue -  cp segment;Penetration/Aspiration  after swallow Penetration/Aspiration details (nectar teaspoon): Material enters  airway, CONTACTS cords and not ejected out Pharyngeal - Nectar Cup: Reduced epiglottic inversion;Reduced  anterior laryngeal mobility;Reduced pharyngeal  peristalsis;Reduced laryngeal elevation;Reduced airway/laryngeal  closure;Reduced tongue base retraction;Penetration/Aspiration  during swallow;Penetration/Aspiration after swallow;Pharyngeal  residue - cp segment Penetration/Aspiration details (nectar cup): Material enters  airway, CONTACTS cords then ejected out Pharyngeal - Thin Pharyngeal - Thin Teaspoon: Reduced epiglottic inversion;Reduced  anterior laryngeal mobility;Reduced laryngeal elevation;Reduced  airway/laryngeal closure;Reduced tongue base  retraction;Penetration/Aspiration during  swallow;Penetration/Aspiration after swallow;Pharyngeal residue -  valleculae;Pharyngeal residue - pyriform sinuses;Pharyngeal  residue - cp segment Penetration/Aspiration details (thin teaspoon): Material enters  airway, CONTACTS cords and not ejected out Pharyngeal - Thin Cup: Pharyngeal residue - cp segment;Pharyngeal  residue -  valleculae;Penetration/Aspiration during  swallow;Pharyngeal residue - pyriform sinuses;Reduced epiglottic  inversion;Reduced anterior laryngeal mobility;Reduced laryngeal  elevation;Reduced airway/laryngeal closure;Reduced tongue base  retraction Penetration/Aspiration details (thin cup): Material enters  airway, passes BELOW cords without attempt by patient to eject  out (silent aspiration) Pharyngeal - Solids Pharyngeal - Puree: Reduced epiglottic inversion;Reduced anterior  laryngeal mobility;Reduced tongue base retraction;Reduced  laryngeal elevation;Reduced airway/laryngeal closure;Pharyngeal  residue - valleculae;Pharyngeal residue - pyriform sinuses Pharyngeal - Regular: Reduced epiglottic inversion;Reduced  anterior laryngeal mobility;Reduced laryngeal elevation;Reduced  airway/laryngeal closure;Reduced tongue base  retraction;Pharyngeal residue - valleculae Pharyngeal Phase - Comment Pharyngeal Comment: head turn left facilitated clearance, dry  swallows decreased stasis, cued cough/throat removed trace  aspirates/penetrates  Cervical Esophageal Phase    GO    Cervical Esophageal Phase Cervical Esophageal Phase: Impaired Cervical Esophageal Phase - Nectar Nectar Teaspoon: Reduced cricopharyngeal relaxation;Esophageal  backflow into the pharynx;Prominent cricopharyngeal segment Nectar Cup: Reduced cricopharyngeal relaxation;Esophageal  backflow into the pharynx;Prominent cricopharyngeal segment Cervical Esophageal Phase - Thin Thin Teaspoon: Reduced cricopharyngeal relaxation;Esophageal  backflow into the pharynx;Prominent cricopharyngeal segment Thin Cup: Reduced cricopharyngeal relaxation;Esophageal backflow  into the pharynx;Prominent cricopharyngeal segment Cervical Esophageal Phase - Solids Puree: Reduced cricopharyngeal relaxation;Esophageal backflow  into the pharynx Regular: Reduced cricopharyngeal relaxation;Esophageal backflow  into the pharynx Cervical Esophageal Phase - Comment Cervical  Esophageal Comment: decreased UES opening contributing  significant to pharyngeal stasis Clearance of esophagus below UES appeared adequate, no  radiologist present to confirm        [name], MS [facility] SLP [phone]     Anti-infectives: Anti-infectives   Start     Dose/Rate Route Frequency Ordered Stop   02/14/13 1400  cefTRIAXone (ROCEPHIN) 2 g in dextrose 5 % 50 mL IVPB     2 g 100 mL/hr over 30 Minutes Intravenous Every 24 hours 02/14/13 1143 02/18/13 1512   02/12/13 1000  vancomycin (VANCOCIN) IVPB 1000 mg/200 mL premix  Status:  Discontinued     1,000 mg 200 mL/hr over 60 Minutes Intravenous Every 12 hours 02/12/13 0943 02/14/13 1137   02/11/13 2300  piperacillin-tazobactam (ZOSYN) IVPB 3.375 g  Status:  Discontinued     3.375 g 12.5 mL/hr over 240 Minutes Intravenous Every 8 hours 02/11/13 2156 02/14/13 1144   02/11/13 2300  vancomycin (VANCOCIN) IVPB 750 mg/150 ml premix  Status:  Discontinued     750 mg 150 mL/hr over 60 Minutes Intravenous Every 12 hours 02/11/13 2156 02/12/13 0943   02/11/13 2230  micafungin (MYCAMINE) 100 mg in sodium chloride 0.9 % 100 mL IVPB  Status:  Discontinued     100 mg 100 mL/hr over 1 Hours Intravenous Daily at bedtime 02/11/13 2137 02/13/13 1023   02/08/13 1700  ceFAZolin (ANCEF) IVPB 1 g/50 mL premix  1 g 100 mL/hr over 30 Minutes Intravenous Every 6 hours 02/08/13 1639 02/09/13 0539   02/08/13 0650  ceFAZolin (ANCEF) IVPB 2 g/50 mL premix     2 g 100 mL/hr over 30 Minutes Intravenous On call to O.R. 02/08/13 0650 02/08/13 0957      Assessment Principal Problem:   Hiatal hernia-recurrent s/p laparoscopic repair and Nissen fundoplication on 02/08/13-tolerating clear liquids Active Problems:   Bell's palsy   Atrial fibrillation-resolved   Acute respiratory failure-resolved   Protein-calorie malnutrition, severe on TPN   Aspiration into airway with pneumonia-resolved    LOS: 14 days   Plan: Advance to full  liquids.   Ame Heagle J 02/22/2013

## 2013-02-22 NOTE — Progress Notes (Signed)
PARENTERAL NUTRITION CONSULT NOTE - FOLLOW UP  Pharmacy Consult for TPN Indication: dysphagia with Bell's Palsy, aspiration s/p hernia repair/Nissen fundoplication 5/27  No Known Allergies  Patient Measurements: Height: 5\' 10"  (177.8 cm) Weight: 183 lb 6.8 oz (83.2 kg) IBW/kg (Calculated) : 73  Vital Signs: Temp: 98 F (36.7 C) (06/10 0535) Temp src: Oral (06/10 0535) BP: 130/79 mmHg (06/10 0535) Pulse Rate: 88 (06/10 0535) Intake/Output from previous day: 06/09 0701 - 06/10 0700 In: 2401.6 [I.V.:466.3; TPN:1935.3] Out: 2295 [Urine:2295]  Labs:  Recent Labs  02/21/13 0520  WBC 7.5  HGB 13.5  HCT 41.6  PLT 397    Recent Labs  02/20/13 0430 02/21/13 0520 02/22/13 0554  NA 140 139 138  K 3.4* 3.9 3.7  CL 103 104 102  CO2 31 30 29   GLUCOSE 126* 114* 120*  BUN 37* 36* 37*  CREATININE 0.84 0.86 0.87  CALCIUM 9.4 9.5 9.4  MG 2.5 2.5  --   PHOS 3.6 3.2  --   PROT  --  6.9 7.0  ALBUMIN  --  2.5* 2.5*  AST  --  69* 73*  ALT  --  88* 89*  ALKPHOS  --  100 102  BILITOT  --  0.7 0.6  PREALBUMIN  --  19.8  --   TRIG  --  79  --   Corr Ca = 10.6 (6/10) Estimated Creatinine Clearance: 67.6 ml/min (by C-G formula based on Cr of 0.87).    Recent Labs  02/21/13 2335 02/22/13 0508 02/22/13 0556  GLUCAP 126* 130* 141*   Medications:  Scheduled:  . antiseptic oral rinse  15 mL Mouth Rinse QID  . chlorhexidine  15 mL Mouth Rinse BID  . furosemide  40 mg Intravenous Daily  . heparin subcutaneous  5,000 Units Subcutaneous Q8H  . insulin aspart  0-9 Units Subcutaneous Q6H  . pantoprazole (PROTONIX) IV  40 mg Intravenous QHS   Infusions:  . sodium chloride 20 mL/hr at 02/21/13 1421  . Marland KitchenTPN (CLINIMIX-E) Adult 83 mL/hr at 02/21/13 1722   And  . fat emulsion 250 mL (02/21/13 1723)    Nutritional Goals:   RD recs (5/30): 1980-2310 Kcal, 110-128 g Protein, 2.5 L fluid  Clinimix 5/20 at 83 ml/hr + Lipids MWF to provide 100 g protein/day and an average of  1959  Kcal/day.   Current nutrition:   Diet: advanced CLD 6/9  IVF: NS at [facility]  Clinimix E 5/20 at 83 ml/hr  Lipids 20% Daily  CBGs & Insulin requirements past 24 hours:   CBGs ordered q6h:  Within goal of < 150 mg/dl  Sensitive SSI :  3 [id number]  Assessment:  79 yo M admit 5/27 s/p laparoscopic hernia repair and Nissen fundoplication on 5/27.  The pt had aspiration during UGI swallow evaluation on 5/29.  He has a history of poor PO intake prior to admission and has severe malnutrition (40lb wt loss PTA).  IV team unable to place PICC and  CCM placed central line on 5/30 (R IJ), plan PICC if fails swallow study  6/10:  Day #12 TPN.  TPN at goal and tolerating thus far.   MBS on 6/9 shows severe dysphagia, SLP recommends thin liquids  Renal function:  SCr is stable/WNL, good UOP with ~equal fluid balance (on lasix 40mg  IV now q24 hr)  Hepatic function: AST/ALT remain slightly elevated, Tbili was elevated, now improved to WNL  Electrolytes: K low/normal   Mg/Phos WNL and stable  Pre-Albumin: improving -  8.9 (5/31), 9.4 (6/2), 19.8 (6/9)  TG/Cholesterol: WNL (5/31), 106 (6/2), 79 (6/9)  Glucose:  Within goal range (< 150 mg/dl)  Plan:  Continue Clinimix E 5/20 to goal 83 ml/hr tonight  Fat emulsion at 10 ml/hr to begin daily 6/9 (shortage abated for now) TNA to contain standard multivitamins and trace elements daily from 6/9 Continue sensitive SSI, but change to ACHS  TNA lab panels on Mondays & Thursdays.  Follow for further diet advancement, ability to wean/dc TNA  Loralee Pacas, PharmD, BCPS Pager: [phone]  02/22/2013 7:10 AM

## 2013-02-22 NOTE — Progress Notes (Signed)
Rehab admissions - Evaluated for possible admission.  I met with patient and his wife yesterday am.  They would like inpatient rehab stay.  However, I received a call from OT that patient is doing very well and could potentially discharge directly home with [facility] therapies.  He is minguard assist with OT needs.  I did review this with my rehab MD and he agrees that minguard is doing too well for an acute inpatient rehab stay.  I expect patient will be able to go home when medically ready with [facility] therapies.  Call me for questions.  #[phone]

## 2013-02-22 NOTE — Progress Notes (Signed)
NUTRITION FOLLOW UP  Intervention:   Diet advancement per MD/SLP discretion TPN per PharmD; recommend continuing until PO intake improves to >50% of most meals. TPN currently meeting 100% of estimated needs.  RD to provide nutritional supplements once TPN is discontinued RD to provide pt with handouts/recipes/diet education prior to discharge if pt is to remain on modified diet after discharge  Nutrition Dx:   Inadequate oral intake related to inability to eat as evidenced by pt NPO s/p hernia repair with swallowing difficulty; discontinued, diet advanced  New Nutrition Dx: Inadequate oral intake related to swallowing difficulty as evidenced by pt on limited (full liquid) diet with poor intake.   Goal:   Pt to meet >/= 90% of their estimated nutrition needs; being met  Monitor:   TPN; at goal and meeting needs  Weight; 3 lb wt gain from 6/5 to 6/9 Labs; low albumin, high BUN SLP recommendations; full liquids PO intake; sips-60%   Assessment:   Pt continues to be on TPN, running at goal rate and meeting 100% of needs. Diet was advanced to clear liquids yesterday and full liquids today. Pt's wife documented amount consumed on pt's meal tickets; pt was on clear liquids this AM and only had 1-2 sips of each item. Pt had full liquid tray for lunch and had 50% of his milk, 60% of potato soup, and sips of the other items. Pt appears to be making progress. Weight is stable. Pt seems to be in good spirits.   Height: Ht Readings from Last 1 Encounters:  02/08/13 5\' 10"  (1.778 m)    Weight Status:   Wt Readings from Last 1 Encounters:  02/21/13 183 lb 6.8 oz (83.2 kg)    Re-estimated needs:  Kcal: 1610-9604  Protein: 98-114 grams  Fluid: 2.4 L  Skin: +1 generalized edema, +1 RUE, LUE, RLE and LLE edema; abdominal incision  Diet Order: Full Liquid   Intake/Output Summary (Last 24 hours) at 02/22/13 1457 Last data filed at 02/22/13 1416  Gross per 24 hour  Intake 2328.51 ml   Output   3077 ml  Net -748.49 ml    Last BM: 6/8   Labs:   Recent Labs Lab 02/18/13 0500  02/20/13 0430 02/21/13 0520 02/22/13 0554  NA 141  < > 140 139 138  K 3.6  < > 3.4* 3.9 3.7  CL 102  < > 103 104 102  CO2 35*  < > 31 30 29   BUN 36*  < > 37* 36* 37*  CREATININE 0.89  < > 0.84 0.86 0.87  CALCIUM 9.4  < > 9.4 9.5 9.4  MG 2.5  --  2.5 2.5  --   PHOS 4.0  --  3.6 3.2  --   GLUCOSE 179*  < > 126* 114* 120*  < > = values in this interval not displayed.  CBG (last 3)   Recent Labs  02/22/13 0508 02/22/13 0556 02/22/13 1133  GLUCAP 130* 141* 125*    Scheduled Meds: . antiseptic oral rinse  15 mL Mouth Rinse QID  . chlorhexidine  15 mL Mouth Rinse BID  . furosemide  40 mg Intravenous Daily  . heparin subcutaneous  5,000 Units Subcutaneous Q8H  . insulin aspart  0-5 Units Subcutaneous QHS  . insulin aspart  0-9 Units Subcutaneous TID WC  . pantoprazole (PROTONIX) IV  40 mg Intravenous QHS    Continuous Infusions: . sodium chloride 20 mL/hr at 02/21/13 1421  . Marland KitchenTPN (CLINIMIX-E) Adult 83 mL/hr  at 02/21/13 1722   And  . fat emulsion 250 mL (02/21/13 1723)  . Marland KitchenTPN (CLINIMIX-E) Adult     And  . fat emulsion      Ian Malkin RD, LDN Inpatient Clinical Dietitian Pager: [phone] After Hours Pager: [phone]

## 2013-02-22 NOTE — Progress Notes (Signed)
Speech Language Pathology Dysphagia Treatment Patient Details Name: Victor Sutton MRN: [id number] DOB: [birth year] Today's Date: 02/22/2013 Time: 4098-1191 SLP Time Calculation (min): 39 min  Assessment / Plan / Recommendation Clinical Impression  Pt visit included skilled dysphagia treatment including review of compensatory strategies, diet modifications and pharyngeal/laryngeal elevation exercises to maximize cricopharyngeal opening thus decreasing pharyngeal stasis.  Did not have pt perform head lift exercise due to line in neck, but pt demonstrated lingual press with mod I at end of session.    Pt reports swallowing to be at approx 65% if baseline was 60% prior to hospital admit.   Intake today was poor at breakfast but improved with lunch full liquid.  Pt reports getting full quickly.  Note RD following for nutrition with plan for supplement when pt off TPN.     If pt tolerates full liquid diet today, rec consider advance to CREAMY pureed foods (consistency of applesauce)  tomorrow with strict compliance to precautions.  Spouse assured this clinician that she would liquify pureed foods for ease of swallow.    Pt denies using oral suction today, reports single reflexive cough with breakfast and expectoration of pharyngeal stasis once.  Anticipate swallow function to continue to improve to baseline dysphagic level with increased strength.    SLP to follow up Thursday, however if pt is to dc hospital prior to next visit, please order follow up SLP at next venue (? Home Health).  Pt would benefit from repeat MBS in approximately one month to assess for readiness to dc swallow strategies - especially given sensorimotor deficits and assure least restrictive diet is in place.    Pt inquired re: medications - whole pills, pt is a high aspiration risk with whole pills - request pt to talk to MD or pharmacist re: liquid or crushable options.  Pt and spouse educated.      Diet Recommendation  Continue with Current Diet:  (Full Liquid)    SLP Plan Continue with current plan of care   Pertinent Vitals/Pain Afebrile, congested cough - productive at times per pt   Swallowing Goals  SLP Swallowing Goals Patient will utilize recommended strategies during swallow to increase swallowing safety with: Modified independent assistance Swallow Study Goal #2 - Progress: Progressing toward goal Swallow Study Goal #3 - Progress: Progressing toward goal Goal #4: Pt will demonstrate laryngeal elevation exercises to improve CP opening with mod independence.  Swallow Study Goal #4 - Progress: Progressing toward goal  General Temperature Spikes Noted: No Respiratory Status: Room air Behavior/Cognition: Alert;Cooperative;Pleasant mood Oral Cavity - Dentition: Adequate natural dentition Patient Positioning: Partially reclined  Oral Cavity - Oral Hygiene   clear  Dysphagia Treatment Treatment focused on: Patient/family/caregiver education Family/Caregiver Educated: pt, pt's sister (retired Charity fundraiser) and pt's spouse Patient observed directly with PO's: No Reason PO's not observed: Other (comment) (dysphagia tx and education session only) Feeding: Able to feed self   GO     [name], MS [facility] SLP [phone]

## 2013-02-23 LAB — COMPREHENSIVE METABOLIC PANEL
ALT: 87 U/L — ABNORMAL HIGH (ref 0–53)
AST: 72 U/L — ABNORMAL HIGH (ref 0–37)
Albumin: 2.5 g/dL — ABNORMAL LOW (ref 3.5–5.2)
Alkaline Phosphatase: 99 U/L (ref 39–117)
BUN: 35 mg/dL — ABNORMAL HIGH (ref 6–23)
CO2: 28 mEq/L (ref 19–32)
Calcium: 9.3 mg/dL (ref 8.4–10.5)
Chloride: 104 mEq/L (ref 96–112)
Creatinine, Ser: 0.9 mg/dL (ref 0.50–1.35)
GFR calc Af Amer: 89 mL/min — ABNORMAL LOW (ref >90)
GFR calc non Af Amer: 77 mL/min — ABNORMAL LOW (ref >90)
Glucose, Bld: 109 mg/dL — ABNORMAL HIGH (ref 70–99)
Potassium: 3.7 mEq/L (ref 3.5–5.1)
Sodium: 139 mEq/L (ref 135–145)
Total Bilirubin: 0.5 mg/dL (ref 0.3–1.2)
Total Protein: 6.9 g/dL (ref 6.0–8.3)

## 2013-02-23 LAB — GLUCOSE, CAPILLARY: Glucose-Capillary: 113 mg/dL — ABNORMAL HIGH (ref 70–99)

## 2013-02-23 MED ORDER — ENSURE COMPLETE PO LIQD
237.0000 mL | Freq: Two times a day (BID) | ORAL | Status: DC
  Administered 2013-02-23: 237 mL via ORAL

## 2013-02-23 MED ORDER — ACETAMINOPHEN 160 MG/5ML PO SOLN: 500.0000 mg | Freq: Four times a day (QID) | ORAL | Status: DC | PRN

## 2013-02-23 MED ORDER — FAT EMULSION 20 % IV EMUL
250.0000 mL | INTRAVENOUS | Status: DC
  Filled 2013-02-23: qty 250

## 2013-02-23 MED ORDER — PANTOPRAZOLE SODIUM 40 MG PO PACK
40.0000 mg | PACK | Freq: Every day | ORAL | Status: DC
  Administered 2013-02-23 – 2013-02-24 (×2): 40 mg via ORAL
  Filled 2013-02-23 (×2): qty 20

## 2013-02-23 MED ORDER — ENSURE COMPLETE PO LIQD
237.0000 mL | Freq: Three times a day (TID) | ORAL | Status: DC
  Administered 2013-02-24: 237 mL via ORAL

## 2013-02-23 MED ORDER — TRACE MINERALS CR-CU-F-FE-I-MN-MO-SE-ZN IV SOLN
INTRAVENOUS | Status: DC
  Filled 2013-02-23: qty 2000

## 2013-02-23 NOTE — Progress Notes (Signed)
Physical Therapy Treatment Patient Details Name: Victor Sutton MRN: [id number] DOB: 1927-11-04 Today's Date: 02/23/2013 Time: 1324-4010 PT Time Calculation (min): 23 min  PT Assessment / Plan / Recommendation Comments on Treatment Session  Progressing with mobility. Continues to demonstrate general weakness and decreased activity tolerance. Recommending HHPT at this time (CIR not an option per chart).     Follow Up Recommendations  Home health PT;Supervision/Assistance - 24 hour initially     Does the patient have the potential to tolerate intense rehabilitation     Barriers to Discharge        Equipment Recommendations  None recommended by PT    Recommendations for Other Services    Frequency Min 3X/week   Plan Discharge plan remains appropriate    Precautions / Restrictions Precautions Precautions: Fall Precaution Comments: lifting restrictions 10 pounds Restrictions Weight Bearing Restrictions: No   Pertinent Vitals/Pain Pt denies pain    Mobility  Bed Mobility Bed Mobility: Not assessed Details for Bed Mobility Assistance: pt sitting in recliner. reported he practiced bed mobility with OT earlier-declined practice a 2nd time today Transfers Transfers: Sit to Stand;Stand to Sit Sit to Stand: 4: Min guard;From chair/3-in-1;With armrests Stand to Sit: 4: Min guard;To chair/3-in-1;With armrests Details for Transfer Assistance: cues for hand placement. Increased time.  Ambulation/Gait Ambulation/Gait Assistance: 4: Min guard Ambulation Distance (Feet): 175 Feet Assistive device: Rolling walker Ambulation/Gait Assistance Details: Slightly unsteady but no LOB. slow gait speed. fatigues fairly easily.     Exercises General Exercises - Lower Extremity Hip Flexion/Marching: AROM;Both;10 reps;Standing Heel Raises: AROM;Both;15 reps;Standing   PT Diagnosis:    PT Problem List:   PT Treatment Interventions:     PT Goals Acute Rehab PT Goals Pt will go Sit to Stand:  with supervision PT Goal: Sit to Stand - Progress: Progressing toward goal Pt will go Stand to Sit: with supervision PT Goal: Stand to Sit - Progress: Progressing toward goal Pt will Ambulate: 51 - 150 feet;with supervision;with rolling walker PT Goal: Ambulate - Progress: Progressing toward goal Pt will Perform Home Exercise Program: with supervision, verbal cues required/provided PT Goal: Perform Home Exercise Program - Progress: Progressing toward goal  Visit Information  Last PT Received On: 02/23/13 Assistance Needed: +1    Subjective Data  Subjective: i rested some today Patient Stated Goal: get better. regain independence.    Cognition  Cognition Arousal/Alertness: Awake/alert Behavior During Therapy: WFL for tasks assessed/performed Overall Cognitive Status: Within Functional Limits for tasks assessed    Balance     End of Session PT - End of Session Activity Tolerance: Patient limited by fatigue;Patient tolerated treatment well Patient left: in chair;with call bell/phone within reach;with family/visitor present   GP     Rebeca Alert, MPT Pager: [phone]

## 2013-02-23 NOTE — Progress Notes (Signed)
NUTRITION FOLLOW UP  Intervention:   Diet advancement per MD/SLP discretion Provide Ensure Complete TID RD to Provide "Pureed Diet" nutrition education and "High Calorie High Protein Recipes"  Nutrition Dx:   Inadequate oral intake related to inability to eat as evidenced by pt NPO s/p hernia repair with swallowing difficulty; discontinued, diet advanced  New Nutrition Dx: Inadequate oral intake related to swallowing difficulty as evidenced by pt on limited (full liquid) diet with poor intake; ongoing   Goal:   Pt to meet >/= 90% of their estimated nutrition needs; being met  Monitor:   TPN; to be discontinued tonight Weight; 3 lb wt gain from 6/5 to 6/9 Labs; low albumin, high BUN SLP recommendations; dysphagia 1, creamy PO intake; sips-60%   Assessment:   6/10: Pt continues to be on TPN, running at goal rate and meeting 100% of needs. Diet was advanced to clear liquids yesterday and full liquids today. Pt's wife documented amount consumed on pt's meal tickets; pt was on clear liquids this AM and only had 1-2 sips of each item. Pt had full liquid tray for lunch and had 50% of his milk, 60% of potato soup, and sips of the other items. Pt appears to be making progress. Weight is stable. Pt seems to be in good spirits.   6/11: Pt reports that he took the whole morning to finish his breakfast. Pt also reports reflux like sensation this afternoon. Pt is eating less than 50% of most tray items. Pt and wife concerned about pt's ability to maintain weight and nutritional status post discharge. Gave pt's wife "Dysphagia 1: Pureed Diet" handout from the Academy of [employer]etics. Pt working with SLP and PT at time of visit; will follow up tomorrow to provide recipes and diet education. TPN to be discontinued tonight.   Height: Ht Readings from Last 1 Encounters:  02/08/13 5\' 10"  (1.778 m)    Weight Status:   Wt Readings from Last 1 Encounters:  02/21/13 183 lb 6.8 oz (83.2 kg)     Re-estimated needs:  Kcal: 2130-8657  Protein: 98-114 grams  Fluid: 2.4 L  Skin: +1 generalized edema, +1 RUE, LUE, RLE and LLE edema; abdominal incision  Diet Order: Dysphagia   Intake/Output Summary (Last 24 hours) at 02/23/13 1636 Last data filed at 02/23/13 1400  Gross per 24 hour  Intake   2728 ml  Output   1101 ml  Net   1627 ml    Last BM: 6/10   Labs:   Recent Labs Lab 02/18/13 0500  02/20/13 0430 02/21/13 0520 02/22/13 0554 02/23/13 0431  NA 141  < > 140 139 138 139  K 3.6  < > 3.4* 3.9 3.7 3.7  CL 102  < > 103 104 102 104  CO2 35*  < > 31 30 29 28   BUN 36*  < > 37* 36* 37* 35*  CREATININE 0.89  < > 0.84 0.86 0.87 0.90  CALCIUM 9.4  < > 9.4 9.5 9.4 9.3  MG 2.5  --  2.5 2.5  --   --   PHOS 4.0  --  3.6 3.2  --   --   GLUCOSE 179*  < > 126* 114* 120* 109*  < > = values in this interval not displayed.  CBG (last 3)   Recent Labs  02/22/13 1559 02/22/13 2113 02/23/13 0711  GLUCAP 111* 112* 113*    Scheduled Meds: . antiseptic oral rinse  15 mL Mouth Rinse QID  .  chlorhexidine  15 mL Mouth Rinse BID  . feeding supplement  237 mL Oral BID BM  . heparin subcutaneous  5,000 Units Subcutaneous Q8H  . pantoprazole sodium  40 mg Oral Daily    Continuous Infusions:    Ian Malkin RD, LDN Inpatient Clinical Dietitian Pager: [phone] After Hours Pager: [phone]

## 2013-02-23 NOTE — Progress Notes (Addendum)
Speech Language Pathology Dysphagia Treatment Patient Details Name: Victor Sutton MRN: [id number] DOB: 05/03/1928 Today's Date: 02/23/2013 Time: 4098-1191 SLP Time Calculation (min): 33 min   Part of therapy time included consulting with spouse regarding precautions, diet, etc.    Assessment / Plan / Recommendation Clinical Impression  Skilled SLP visit to assess tolerance of po diet, review of compensatory strategies and pharyngeal/laryngeal elevation exercises to maximize swallow rehabilitation.  Pt performing compensatory strategies independently.   Spouse admitted pt with increased in throat clearing today with pureed intake - advised spouse and pt to increased muscular contraction, UES opening required for increased viscocity.  Advised pt that liquids are easier for him to swallow currently due to decreased stasis.  As eating is laborious for pt, advised pt to get his main nutrition via liquids (supplements as RD indicates) and consume creamy pureed foods for comfort.  Pt again states he gets full easily and food is not palatable- note plan to stop TNA tonight- hopeful that will increase appetite.  Concern present re: pt ability to maintain nutrition- RD following.  Rec pt continue modified diet with strict precautions, follow up with [facility] SLP and return for repeat MBS in 4-6 weeks (or as HH SLP indicates) when pt clinically demonstrates improved swallow ability.  Further advised pt and spouse to speak to pharmacist re: medication administration being crushable to suspension due to level of dysphagia and aspiration risk.      Diet Recommendation  Continue with Current Diet: Dysphagia 1 (puree);Thin liquid    SLP Plan Continue with current plan of care   Pertinent Vitals/Pain Afebrile, clear, room air   Swallowing Goals  SLP Swallowing Goals Swallow Study Goal #1 - Progress: Discontinued (comment) (pt with ongoing s/s of aspiration, throat clearing) Patient will utilize recommended  strategies during swallow to increase swallowing safety with: Independent assistance Swallow Study Goal #2 - Progress: Met Swallow Study Goal #3 - Progress: Partly met Swallow Study Goal #4 - Progress: Progressing toward goal  General Temperature Spikes Noted: No Respiratory Status: Room air Behavior/Cognition: Alert;Cooperative (pt with flat affect today)-  spouse reports pt did not sleep well last night Oral Cavity - Dentition: Adequate natural dentition Patient Positioning: Upright in bed  Oral Cavity - Oral Hygiene   clear  Dysphagia Treatment Treatment focused on: Skilled observation of diet tolerance;Patient/family/caregiver education Family/Caregiver Educated: pt, pt's spouse Treatment Methods/Modalities: Skilled observation (review of exercises) Patient observed directly with PO's: Yes Type of PO's observed: Thin liquids Feeding: Able to feed self Liquids provided via: Cup Pharyngeal Phase Signs & Symptoms: Multiple swallows   GO     [name], MS [facility] SLP [phone]

## 2013-02-23 NOTE — Progress Notes (Signed)
Rehab admissions - I spoke with patient.  He continues to do well with therapies.  He is very upset that he could not admit to inpatient rehab since he is doing so well.  Recommendations now are for home with [facility].  Patient did verbalize a lot to me.  His wife was present in the room as well.  He is at a minguard level and likely will do well with HH therapies.  I will sign off at this point for acute inpatient rehab.  Call me for questions.  #[phone]

## 2013-02-23 NOTE — Progress Notes (Addendum)
Occupational Therapy Treatment Patient Details Name: Victor Sutton MRN: [id number] DOB: [birth year] Today's Date: 02/23/2013 Time: 4098-1191 OT Time Calculation (min): 41 min  OT Assessment / Plan / Recommendation Comments on Treatment Session Patient fatiques easily.  Need to continue to practice bed mobility and standing balance/endurance for safe discharge home.  Pt gets shaky but did not lose balance    Follow Up Recommendations  Home health OT;Supervision/Assistance - 24 hour    Barriers to Discharge       Equipment Recommendations  None recommended by OT    Recommendations for Other Services    Frequency Min 2X/week   Plan Discharge plan remains appropriate    Precautions / Restrictions Precautions Precautions: Other (comment) Precaution Comments: lifting restrictions 10 pounds Restrictions Weight Bearing Restrictions: No   Pertinent Vitals/Pain No pain    ADL  Grooming: Wash/dry hands;Min guard Where Assessed - Grooming: Supported standing Toilet Transfer: Hydrographic surveyor Method: Sit to Barista: Comfort height toilet;Grab bars Toileting - Architect and Hygiene: Min guard Where Assessed - Engineer, mining and Hygiene: Sit to stand from 3-in-1 or toilet Tub/Shower Transfer: Min guard Tub/Shower Transfer Method: Science writer: Walk in Scientist, research (physical sciences) Used: Rolling walker Transfers/Ambulation Related to ADLs: cues for safety with RW. ADL Comments: Pt fatiques easily.  Min guard for safety when walking and for sit to stand.  Wife recently had back surgery and cannot lift more than 10 pounds.  Her sister is coming to help x 1 week.  Reviewed energy conservation with pt/wife.  Pt already uses some strategies such as breaking up shower and dressing and sitting when possible.  Pt needs mod A for bed mobility and cannot use leg lifter due to lifting precautions.  Discussed possibly  sleeping in recliner.  He has drifted down/sideways in the past.  May need wedge pillow if he can master bed mobility.  He practiced sit to supine via sidelying on R today. Pt does have a reacher which he can use to retrieve low items such as shoes--cautioned that he must not lift anything heavy due to increased weight with long lever arm.   OT Diagnosis:    OT Problem List:   OT Treatment Interventions:     OT Goals ADL Goals Pt Will Perform Grooming: with supervision;Standing at sink ADL Goal: Grooming - Progress: Progressing toward goals Pt Will Transfer to Toilet: with supervision;Ambulation;3-in-1 ADL Goal: Toilet Transfer - Progress: Progressing toward goals Pt Will Perform Toileting - Clothing Manipulation: with supervision;Standing ADL Goal: Toileting - Clothing Manipulation - Progress: Progressing toward goals Pt Will Perform Tub/Shower Transfer: Shower transfer;with supervision ADL Goal: Web designer - Progress: Progressing toward goals Miscellaneous OT Goals Miscellaneous OT Goal #1: pt will initiate rest breaks during therapy without cues for energy conservation OT Goal: Miscellaneous Goal #1 - Progress: Goal set today Miscellaneous OT Goal #2: Pt will perform bed mobility from bed with 30 degree angle, no rails at supervision level in preparation for toilet transfers OT Goal: Miscellaneous Goal #2 - Progress: Goal set today  Visit Information  Last OT Received On: 02/23/13 Assistance Needed: +1    Subjective Data      Prior Functioning       Cognition  Cognition Arousal/Alertness: Awake/alert Behavior During Therapy: WFL for tasks assessed/performed Overall Cognitive Status: Within Functional Limits for tasks assessed    Mobility  Bed Mobility Sit to Sidelying Right: 3: Mod assist Details for Bed Mobility Assistance: cues for technique  Transfers Sit to Stand: 4: Min guard;With upper extremity assist;From toilet;Other (comment);From chair/3-in-1 Details  for Transfer Assistance: cues for hand placement    Exercises      Balance     End of Session OT - End of Session Activity Tolerance: Patient limited by fatigue Patient left: in bed;with call bell/phone within reach;with family/visitor present  GO     Mekiyah Gladwell 02/23/2013, 3:15 PM Marica Otter, OTR/L [phone] 02/23/2013

## 2013-02-23 NOTE — Progress Notes (Signed)
Patient expressed concern of going home with home health and not being able to perform basic tasks at home and his wife is not able to care for him. Patient stated upset he does not qualify for rehab due to one assessment from occupational therapy. Nasiyah Laverdiere RN

## 2013-02-23 NOTE — Progress Notes (Signed)
PARENTERAL NUTRITION CONSULT NOTE - FOLLOW UP  Pharmacy Consult for TPN Indication: dysphagia with Bell's Palsy, aspiration s/p hernia repair/Nissen fundoplication 5/27  No Known Allergies  Patient Measurements: Height: 5\' 10"  (177.8 cm) Weight: 183 lb 6.8 oz (83.2 kg) IBW/kg (Calculated) : 73  Vital Signs: Temp: 97.9 F (36.6 C) (06/11 0512) Temp src: Oral (06/11 0512) BP: 125/71 mmHg (06/11 0512) Pulse Rate: 91 (06/11 0512) Intake/Output from previous day: 06/10 0701 - 06/11 0700 In: 2563 [P.O.:1080; I.V.:472; TPN:1011] Out: 2302 [Urine:2300; Stool:2]  Labs:  Recent Labs  02/21/13 0520  WBC 7.5  HGB 13.5  HCT 41.6  PLT 397    Recent Labs  02/21/13 0520 02/22/13 0554 02/23/13 0431  NA 139 138 139  K 3.9 3.7 3.7  CL 104 102 104  CO2 30 29 28   GLUCOSE 114* 120* 109*  BUN 36* 37* 35*  CREATININE 0.86 0.87 0.90  CALCIUM 9.5 9.4 9.3  MG 2.5  --   --   PHOS 3.2  --   --   PROT 6.9 7.0 6.9  ALBUMIN 2.5* 2.5* 2.5*  AST 69* 73* 72*  ALT 88* 89* 87*  ALKPHOS 100 102 99  BILITOT 0.7 0.6 0.5  PREALBUMIN 19.8  --   --   TRIG 79  --   --   Corr Ca = 10.5 (6/11) Estimated Creatinine Clearance: 65.3 ml/min (by C-G formula based on Cr of 0.9).    Recent Labs  02/22/13 1559 02/22/13 2113 02/23/13 0711  GLUCAP 111* 112* 113*   Medications:  Scheduled:  . antiseptic oral rinse  15 mL Mouth Rinse QID  . chlorhexidine  15 mL Mouth Rinse BID  . furosemide  40 mg Intravenous Daily  . heparin subcutaneous  5,000 Units Subcutaneous Q8H  . insulin aspart  0-5 Units Subcutaneous QHS  . insulin aspart  0-9 Units Subcutaneous TID WC  . pantoprazole (PROTONIX) IV  40 mg Intravenous QHS   Infusions:  . sodium chloride 20 mL/hr at 02/21/13 1421  . Marland KitchenTPN (CLINIMIX-E) Adult 83 mL/hr at 02/22/13 1718   And  . fat emulsion 250 mL (02/22/13 1718)    Nutritional Goals:   RD recs (5/30): 1980-2310 Kcal, 110-128 g Protein, 2.5 L fluid  Clinimix 5/20 at 83 ml/hr +  Lipids MWF to provide 100 g protein/day and an average of  1959 Kcal/day.   Current nutrition:   Diet: advanced FLD 6/10  IVF: [facility] at [facility] 2  Clinimix E 5/20 at 83 ml/hr  Lipids 20% Daily  CBGs & Insulin requirements past 24 hours:   CBGs ordered achs:  Within goal of < 150 mg/dl  Sensitive SSI :  1 ZOXWR/60A  Assessment:  79 yo M admit 5/27 s/p laparoscopic hernia repair and Nissen fundoplication on 5/27.  The pt had aspiration during UGI swallow evaluation on 5/29.  He has a history of poor PO intake prior to admission and has severe malnutrition (40lb wt loss PTA).  IV team unable to place PICC and  CCM placed central line on 5/30 (R IJ), plan PICC if fails swallow study  MBS on 6/9 shows severe dysphagia, SLP recommends thin liquids - advanced to full liquids 6/10. RD recommends continuing TNA until tolerating >50% most meals.  Renal function:  SCr is stable/WNL, good UOP with ~equal fluid balance (on lasix 40mg  IV now q24 hr)  Hepatic function: AST/ALT remain slightly elevated, Tbili was elevated, now improved to WNL  Electrolytes: K low/normal   Mg/Phos WNL and  stable  Pre-Albumin: improving - 8.9 (5/31), 9.4 (6/2), 19.8 (6/9)  TG/Cholesterol: WNL (5/31), 106 (6/2), 79 (6/9)  Glucose:  Within goal range (< 150 mg/dl)  1/61: Day #09 TPN - ok per Dr. Abbey Chatters to d/c today  Plan:   Decrease TPN to 75ml/hr now  Stop at 1800 tonight   Loralee Pacas, PharmD, BCPS Pager: [phone]  02/23/2013 7:26 AM

## 2013-02-23 NOTE — Care Management Note (Signed)
    Page 1 of 2   02/23/2013     10:32:49 AM   CARE MANAGEMENT NOTE 02/23/2013  Patient:  Victor Sutton, Victor Sutton   Account Number:  [id number]  Date Initiated:  02/09/2013  Documentation initiated by:  DAVIS,RHONDA  Subjective/Objective Assessment:   patient had fundoplication on 05272014/s/p surg a.fib with rvr, resp distress-intubated     Action/Plan:   lives at home with his wife   Anticipated DC Date:  02/25/2013   Anticipated DC Plan:  HOME W HOME HEALTH SERVICES  In-house referral  Clinical Social Worker      DC Associate Professor  CM consult      [facility] Choice  HOME HEALTH   Choice offered to / List presented to:  C-3 Spouse   DME arranged  NA      DME agency  NA     HH arranged  HH-2 PT  HH-1 RN  HH-3 OT  HH-5 SPEECH THERAPY      HH agency  [facility]   Status of service:  Completed, signed off Medicare Important Message given?  NA - LOS <3 / Initial given by admissions (If response is "NO", the following Medicare IM given date fields will be blank) Date Medicare IM given:   Date Additional Medicare IM given:    Discharge Disposition:  HOME W HOME HEALTH SERVICES  Per UR Regulation:  Reviewed for med. necessity/level of care/duration of stay  If discussed at Long Length of Stay Meetings, dates discussed:    Comments:  02-23-13 Lorenda Ishihara RN CM [phone] Spoke with patient at bedside, discussed decision by CIR that patient no longer appropriate for that setting. Patient and wife disappointed, wife has contacted CIR nurse to discuss with her. Encouraged patient and spouse to have back up plan in case appeal did not change decision. Patient differed decision to spouse. Spouse provided a list for choice, chose [facility], contacted [facility] to make aware of possible needs. Awaiting final decision and orders, patient progressing diet. Denies any need for DME. Will continue to follow for d/c needs, awaiting final orders.  [id number] Earlene Plater, RN, BSN, CCM: CHART  REVIEWED AND UPDATED.  Next chart review due on [id number]. NO DISCHARGE NEEDS PRESENT AT THIS TIME. CASE MANAGEMENT [phone]   [id number] Earlene Plater, RN, BSN, CCM:  CHART REVIEWED AND UPDATED.  Next chart review due on [id number]. NO DISCHARGE NEEDS PRESENT AT THIS TIME. CASE MANAGEMENT [phone]   [id number] Earlene Plater, RN, BSN, CCM:  CHART REVIEWED AND UPDATED.  Next chart review due on [id number]. NO DISCHARGE NEEDS PRESENT AT THIS TIME. CASE MANAGEMENT [phone]

## 2013-02-23 NOTE — Progress Notes (Signed)
15 Days Post-Op  Subjective: Swallowing full liquids well.  Disappointed that he did not qualify for CIR  Objective: Vital signs in last 24 hours: Temp:  [97.9 F (36.6 C)-98.1 F (36.7 C)] 97.9 F (36.6 C) (06/11 0512) Pulse Rate:  [91-98] 91 (06/11 0512) Resp:  [18-20] 18 (06/11 0512) BP: (112-165)/(68-99) 125/71 mmHg (06/11 0512) SpO2:  [95 %-98 %] 96 % (06/11 0512) Last BM Date: 02/20/13  Intake/Output from previous day: 06/10 0701 - 06/11 0700 In: 2563 [P.O.:1080; I.V.:472; TPN:1011] Out: 2302 [Urine:2300; Stool:2] Intake/Output this shift:    PE: General- In NAD CV-RRR Abdomen-soft, incisions are clean and intact  Lab Results:   Recent Labs  02/21/13 0520  WBC 7.5  HGB 13.5  HCT 41.6  PLT 397   BMET  Recent Labs  02/22/13 0554 02/23/13 0431  NA 138 139  K 3.7 3.7  CL 102 104  CO2 29 28  GLUCOSE 120* 109*  BUN 37* 35*  CREATININE 0.87 0.90  CALCIUM 9.4 9.3   PT/INR No results found for this basename: LABPROT, INR,  in the last 72 hours Comprehensive Metabolic Panel:    Component Value Date/Time   NA 139 02/23/2013 0431   K 3.7 02/23/2013 0431   CL 104 02/23/2013 0431   CO2 28 02/23/2013 0431   BUN 35* 02/23/2013 0431   CREATININE 0.90 02/23/2013 0431   GLUCOSE 109* 02/23/2013 0431   CALCIUM 9.3 02/23/2013 0431   AST 72* 02/23/2013 0431   ALT 87* 02/23/2013 0431   ALKPHOS 99 02/23/2013 0431   BILITOT 0.5 02/23/2013 0431   PROT 6.9 02/23/2013 0431   ALBUMIN 2.5* 02/23/2013 0431     Studies/Results: Dg Swallowing Func-speech Pathology  02/21/2013   Chales Abrahams, CCC-SLP     02/21/2013  2:26 PM Objective Swallowing Evaluation: Modified Barium Swallowing Study   Patient Details  Name: Victor Sutton MRN: [id number] Date of Birth: 1928-03-17  Today's Date: 02/21/2013 Time: 4098-1191 SLP Time Calculation (min): 42 min  Past Medical History:  Past Medical History  Diagnosis Date  . BPH (benign prostatic hyperplasia)   . Macular degeneration     (L) wet,  (R) dry  . B12 deficiency     HIstory  . 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 Leukoplakia 2001    Treated for  . Hypertension   . Iron deficiency anemia   . Cataract 2009    bilateral cataract surgery  . GERD (gastroesophageal reflux disease)   . Hyperlipidemia     not on medication  . Hiatal hernia   . Peripheral neuropathy     Mild History of  . Ulcer   . Bell's palsy     at present   Past Surgical History:  Past Surgical History  Procedure Laterality Date  . Cholecystectomy    . Arthroscopic knee surgery      left  . Multiple oral surgeries  2002  . Tonsillectomy    . Cateract surgeries       bilateral  . Stomach surgery  2005    states his stomach had moved up toward his chest , some type of  surgery performed  . Hernia repair      Laparoscopic ventral  . Hiatal hernia repair  2004  . Skin cancer excision  10/2012    right leg-shin area  . Hiatal hernia repair N/A 02/08/2013    Procedure:  REPAIR OFCURRENT HIATAL HERNIA, ;  Surgeon: Adolph Pollack, MD;  Location: [facility];  Service: General;   Laterality: N/A;  . Upper gi endoscopy N/A 02/08/2013    Procedure: UPPER GI ENDOSCOPY;  Surgeon: Adolph Pollack, MD;   Location: [facility];  Service: General;  Laterality: N/A;  . Laparoscopic lysis of adhesions N/A 02/08/2013    Procedure: LAPAROSCOPIC LYSIS OF ADHESIONS;  Surgeon: Adolph Pollack, MD;  Location: [facility];  Service: General;   Laterality: N/A;  . Laparoscopic nissen fundoplication  02/08/2013    Procedure: LAPAROSCOPIC NISSEN FUNDOPLICATION;  Surgeon: Adolph Pollack, MD;  Location: [facility];  Service: General;;   HPI:  80 year old male with a history of a recurrent hiatal hernia and  reflux. He was taken to the OR 02/08/13 for an open repair of his  hernia with a Nissen fundoplication. He tolerated the operation  well and was take to PACU.  While in PACU he developed  respiratory distress following extubation with A-fib and RVR. He  received multiple agents to control his heart rhythm without  success and  was re-intubated.  Pt has been intubated 5/27-5/28.   He underwent a gastrograffin study 02/10/13 and aspirated during  the procedure.  Pt developed respiratory problems and underwent  bronch requring reintubation 5/30-6/1.  Pt underwent sniff test  to evaluate diaphragm function, which was found to be negative.   Given hx of GI issues, aspiration during gastrograffin test and  current Bell's Palsy, swallow eval was ordered.    Initially  critical care service ordered at BSE and surgical MD had ordered  MBS and barium swallow per SLP recommendations.  Pt was reticent  re: participating in [facility] and had questions re: procedure and SLP  conducted BSE in room to assure ready for instrumental  evaluation.  Pt has h/o esophageal dysphagia but had recent onset  of oral deficits characterized by difficulties forming food  boluses and orally transiting since Bell's palsy (April 2014) per  pt.       Assessment / Plan / Recommendation Clinical Impression  Dysphagia Diagnosis: Severe cervical esophageal phase  dysphagia;Mild oral phase dysphagia;Moderate pharyngeal phase  dysphagia  Clinical impression: Pt presents with mild oral, moderate  pharyngeal and severe cervical esophageal dysphagia.  Both  sensory and motor deficits apparent.  Dysphagia characterized by  poor UES opening (suspect decreased laryngeal elevation/closure  contributing), decreased epiglottic deflection (decr tongue base  retraction) resulting in stasis throughout pharynx that pt does  not always sense.  In addition, pt's residuals mixed with  secretions.    Head turn left helped UES opening and subsequently decreased  pharyngeal stasis and decreased laryngeal penetration to trace  amounts of thin/nectar.  Cued and reflexive dry swallows with  head turn decreased oropharyngeal stasis.  Pt did tracely  aspirate thin barium with head neutral but cleared with cued  cough/throat clear.  In addition, pt with oral stasis that  prematurely spilled into pharynx  without reflexive swallow to  clear.     As pt is cognitively intact and can follow stringent precautions,  option to pursue po to determine if he can support himself is  recommended.  Intake will be effortful for pt given the number of  strategies needed for airway protection.  SLP to continue to  follow for dysphagia treatment.      Treatment Recommendation  Therapy as outlined in treatment plan below    Diet Recommendation Thin liquid (consider clear or full liquids  initially)   Liquid Administration via: Cup;No straw  Medication Administration:  (LIQUIDS) Supervision: Intermittent supervision to cue for compensatory  strategies;Patient able to self feed Compensations: Slow rate;Small sips/bites;Multiple dry swallows  after each bite/sip;Clear throat intermittently (masticate foods  to "mush") Postural Changes and/or Swallow Maneuvers: Head tilt left during  swallow    Other  Recommendations Oral Care Recommendations: Oral care BID Other Recommendations: Have oral suction available   Follow Up Recommendations  Inpatient Rehab    Frequency and Duration min 2x/week  2 weeks   Pertinent Vitals/Pain Afebrile, decreased    SLP Swallow Goals Patient will consume recommended diet without observed clinical  signs of aspiration with: Supervision/safety Swallow Study Goal #3 - Progress: Progressing toward goal Swallow Study Goal #4 - Progress: Progressing toward goal   General Date of Onset: 02/08/13 HPI: 79 year old male with a history of a recurrent hiatal hernia  and reflux. He was taken to the OR 02/08/13 for an open repair of  his hernia with a Nissen fundoplication. He tolerated the  operation well and was take to PACU.  While in PACU he developed  respiratory distress following extubation with A-fib and RVR. He  received multiple agents to control his heart rhythm without  success and was re-intubated.  Pt has been intubated 5/27-5/28.   He underwent a gastrograffin study 02/10/13 and aspirated during  the procedure.   Pt developed respiratory problems and underwent  bronch requring reintubation 5/30-6/1.  Pt underwent sniff test  to evaluate diaphragm function, which was found to be negative.   Given hx of GI issues, aspiration during gastrograffin test and  current Bell's Palsy, swallow eval was ordered.    Initially  critical care service ordered at BSE and surgical MD had ordered  MBS and barium swallow per SLP recommendations.  Pt was reticent  re: participating in [facility] and had questions re: procedure and SLP  conducted BSE in room to assure ready for instrumental  evaluation.  Pt has h/o esophageal dysphagia but had recent onset  of oral deficits characterized by difficulties forming food  boluses and orally transiting since Bell's palsy (April 2014) per  pt.   Type of Study: Modified Barium Swallowing Study Reason for Referral: Objectively evaluate swallowing function Diet Prior to this Study: NPO;TNA Temperature Spikes Noted: No Respiratory Status:  (2 liters) History of Recent Intubation: Yes Length of Intubations (days): 5 days Date extubated: 02/13/13 Behavior/Cognition: Alert;Cooperative;Pleasant mood Oral Cavity - Dentition: Adequate natural dentition Oral Motor / Sensory Function: Impaired - see Bedside swallow  eval Self-Feeding Abilities: Able to feed self Patient Positioning: Upright in chair Baseline Vocal Quality: Wet;Hoarse;Low vocal intensity Volitional Cough: Strong Volitional Swallow: Able to elicit Anatomy: Within functional limits Pharyngeal Secretions: Standing secretions in (comment) (t/o  pharynx)    Reason for Referral Objectively evaluate swallowing function   Oral Phase Oral Preparation/Oral Phase Oral Phase: Impaired Oral - Nectar Oral - Nectar Teaspoon: Weak lingual manipulation;Reduced  posterior propulsion;Piecemeal swallowing;Lingual/palatal residue Oral - Nectar Cup: Weak lingual manipulation;Reduced posterior  propulsion;Piecemeal swallowing Oral - Thin Oral - Thin Teaspoon: Weak lingual  manipulation;Reduced posterior  propulsion;Piecemeal swallowing Oral - Thin Cup: Weak lingual manipulation;Reduced posterior  propulsion;Piecemeal swallowing Oral - Solids Oral - Puree: Weak lingual manipulation;Reduced posterior  propulsion;Piecemeal swallowing;Lingual/palatal residue Oral - Regular: Weak lingual manipulation;Reduced posterior  propulsion;Piecemeal swallowing;Lingual/palatal residue   Pharyngeal Phase Pharyngeal Phase Pharyngeal Phase: Impaired Pharyngeal - Nectar Pharyngeal - Nectar Teaspoon: Reduced epiglottic  inversion;Reduced anterior laryngeal mobility;Reduced laryngeal  elevation;Reduced airway/laryngeal closure;Reduced tongue base  retraction;Penetration/Aspiration during swallow;Pharyngeal  residue - pyriform sinuses;Pharyngeal residue -  valleculae;Pharyngeal residue - cp segment;Penetration/Aspiration  after swallow Penetration/Aspiration details (nectar teaspoon): Material enters  airway, CONTACTS cords and not ejected out Pharyngeal - Nectar Cup: Reduced epiglottic inversion;Reduced  anterior laryngeal mobility;Reduced pharyngeal  peristalsis;Reduced laryngeal elevation;Reduced airway/laryngeal  closure;Reduced tongue base retraction;Penetration/Aspiration  during swallow;Penetration/Aspiration after swallow;Pharyngeal  residue - cp segment Penetration/Aspiration details (nectar cup): Material enters  airway, CONTACTS cords then ejected out Pharyngeal - Thin Pharyngeal - Thin Teaspoon: Reduced epiglottic inversion;Reduced  anterior laryngeal mobility;Reduced laryngeal elevation;Reduced  airway/laryngeal closure;Reduced tongue base  retraction;Penetration/Aspiration during  swallow;Penetration/Aspiration after swallow;Pharyngeal residue -  valleculae;Pharyngeal residue - pyriform sinuses;Pharyngeal  residue - cp segment Penetration/Aspiration details (thin teaspoon): Material enters  airway, CONTACTS cords and not ejected out Pharyngeal - Thin Cup: Pharyngeal residue - cp  segment;Pharyngeal  residue - valleculae;Penetration/Aspiration during  swallow;Pharyngeal residue - pyriform sinuses;Reduced epiglottic  inversion;Reduced anterior laryngeal mobility;Reduced laryngeal  elevation;Reduced airway/laryngeal closure;Reduced tongue base  retraction Penetration/Aspiration details (thin cup): Material enters  airway, passes BELOW cords without attempt by patient to eject  out (silent aspiration) Pharyngeal - Solids Pharyngeal - Puree: Reduced epiglottic inversion;Reduced anterior  laryngeal mobility;Reduced tongue base retraction;Reduced  laryngeal elevation;Reduced airway/laryngeal closure;Pharyngeal  residue - valleculae;Pharyngeal residue - pyriform sinuses Pharyngeal - Regular: Reduced epiglottic inversion;Reduced  anterior laryngeal mobility;Reduced laryngeal elevation;Reduced  airway/laryngeal closure;Reduced tongue base  retraction;Pharyngeal residue - valleculae Pharyngeal Phase - Comment Pharyngeal Comment: head turn left facilitated clearance, dry  swallows decreased stasis, cued cough/throat removed trace  aspirates/penetrates  Cervical Esophageal Phase    GO    Cervical Esophageal Phase Cervical Esophageal Phase: Impaired Cervical Esophageal Phase - Nectar Nectar Teaspoon: Reduced cricopharyngeal relaxation;Esophageal  backflow into the pharynx;Prominent cricopharyngeal segment Nectar Cup: Reduced cricopharyngeal relaxation;Esophageal  backflow into the pharynx;Prominent cricopharyngeal segment Cervical Esophageal Phase - Thin Thin Teaspoon: Reduced cricopharyngeal relaxation;Esophageal  backflow into the pharynx;Prominent cricopharyngeal segment Thin Cup: Reduced cricopharyngeal relaxation;Esophageal backflow  into the pharynx;Prominent cricopharyngeal segment Cervical Esophageal Phase - Solids Puree: Reduced cricopharyngeal relaxation;Esophageal backflow  into the pharynx Regular: Reduced cricopharyngeal relaxation;Esophageal backflow  into the pharynx Cervical Esophageal  Phase - Comment Cervical Esophageal Comment: decreased UES opening contributing  significant to pharyngeal stasis Clearance of esophagus below UES appeared adequate, no  radiologist present to confirm        [name], MS [facility] SLP [phone]     Anti-infectives: Anti-infectives   Start     Dose/Rate Route Frequency Ordered Stop   02/14/13 1400  cefTRIAXone (ROCEPHIN) 2 g in dextrose 5 % 50 mL IVPB     2 g 100 mL/hr over 30 Minutes Intravenous Every 24 hours 02/14/13 1143 02/18/13 1512   02/12/13 1000  vancomycin (VANCOCIN) IVPB 1000 mg/200 mL premix  Status:  Discontinued     1,000 mg 200 mL/hr over 60 Minutes Intravenous Every 12 hours 02/12/13 0943 02/14/13 1137   02/11/13 2300  piperacillin-tazobactam (ZOSYN) IVPB 3.375 g  Status:  Discontinued     3.375 g 12.5 mL/hr over 240 Minutes Intravenous Every 8 hours 02/11/13 2156 02/14/13 1144   02/11/13 2300  vancomycin (VANCOCIN) IVPB 750 mg/150 ml premix  Status:  Discontinued     750 mg 150 mL/hr over 60 Minutes Intravenous Every 12 hours 02/11/13 2156 02/12/13 0943   02/11/13 2230  micafungin (MYCAMINE) 100 mg in sodium chloride 0.9 % 100 mL IVPB  Status:  Discontinued     100 mg 100 mL/hr over 1 Hours Intravenous Daily at bedtime 02/11/13 2137 02/13/13 1023  02/08/13 1700  ceFAZolin (ANCEF) IVPB 1 g/50 mL premix     1 g 100 mL/hr over 30 Minutes Intravenous Every 6 hours 02/08/13 1639 02/09/13 0539   02/08/13 0650  ceFAZolin (ANCEF) IVPB 2 g/50 mL premix     2 g 100 mL/hr over 30 Minutes Intravenous On call to O.R. 02/08/13 0650 02/08/13 0957      Assessment Principal Problem:   Hiatal hernia-recurrent s/p laparoscopic repair and Nissen fundoplication on 02/08/13-tolerating full liquids Active Problems:   Bell's palsy   Atrial fibrillation-resolved   Acute respiratory failure-resolved   Protein-calorie malnutrition, severe on TPN   Aspiration into airway with pneumonia-resolved    LOS: 15 days   Plan: [facility] to creamy  pureed diet.  Stop TPN.     Laylynn Campanella J 02/23/2013

## 2013-02-24 NOTE — Progress Notes (Signed)
Occupational Therapy Treatment Patient Details Name: Victor Sutton MRN: [id number] DOB: [birth year] Today's Date: 02/24/2013 Time: 4098-1191 OT Time Calculation (min): 10 min  OT Assessment / Plan / Recommendation Comments on Treatment Session      Follow Up Recommendations  Home health OT;Supervision/Assistance - 24 hour    Barriers to Discharge       Equipment Recommendations  None recommended by OT    Recommendations for Other Services    Frequency     Plan Discharge plan remains appropriate    Precautions / Restrictions Precautions Precautions: Fall Precaution Comments: lifting restrictions 10 pounds Restrictions Weight Bearing Restrictions: No   Pertinent Vitals/Pain No pain reported    ADL  Transfers/Ambulation Related to ADLs: pt too fatiqued to practice bed mobility ADL Comments: Pt just back to bed.  He reports that he did his own adl this morning.  Pt states he will go home this afternoon.  He and wife do not have concerns:  her sister is coming to help starting today. Reviewed energy conservation and gave handout.  Pt verbalizes understanding of all.      OT Diagnosis:    OT Problem List:   OT Treatment Interventions:     OT Goals Miscellaneous OT Goals Miscellaneous OT Goal #1: pt will initiate rest breaks during therapy without cues for energy conservation OT Goal: Miscellaneous Goal #1 - Progress: Other (comment) (verbalizes energy conservation; did not perform tasks)  Visit Information  Last OT Received On: 02/24/13 Assistance Needed: +1    Subjective Data      Prior Functioning       Cognition  Cognition Arousal/Alertness: Awake/alert Behavior During Therapy: WFL for tasks assessed/performed Overall Cognitive Status: Within Functional Limits for tasks assessed    Mobility       Exercises      Balance     End of Session OT - End of Session Patient left: in bed;with call bell/phone within reach;with family/visitor present  GO      Victor Sutton 02/24/2013, 12:35 PM Marica Otter, OTR/L [phone] 02/24/2013

## 2013-02-24 NOTE — Progress Notes (Signed)
16 Days Post-Op  Subjective: Swallowing fairly well and feeling better overall.  Objective: Vital signs in last 24 hours: Temp:  [97.5 F (36.4 C)-98 F (36.7 C)] 97.5 F (36.4 C) (06/12 0603) Pulse Rate:  [89-106] 89 (06/12 0603) Resp:  [16-18] 18 (06/12 0603) BP: (106-124)/(71-85) 114/71 mmHg (06/12 0603) SpO2:  [93 %-95 %] 93 % (06/12 0603) Last BM Date: 02/22/13  Intake/Output from previous day: 06/11 0701 - 06/12 0700 In: 1292.7 [P.O.:720; I.V.:95.7; TPN:477] Out: 1001 [Urine:1000; Stool:1] Intake/Output this shift: Total I/O In: 120 [P.O.:120] Out: 300 [Urine:300]  PE: General- In NAD CV-RRR Abdomen-soft, incisions are clean and intact  Lab Results:  No results found for this basename: WBC, HGB, HCT, PLT,  in the last 72 hours BMET  Recent Labs  02/22/13 0554 02/23/13 0431  NA 138 139  K 3.7 3.7  CL 102 104  CO2 29 28  GLUCOSE 120* 109*  BUN 37* 35*  CREATININE 0.87 0.90  CALCIUM 9.4 9.3   PT/INR No results found for this basename: LABPROT, INR,  in the last 72 hours Comprehensive Metabolic Panel:    Component Value Date/Time   NA 139 02/23/2013 0431   K 3.7 02/23/2013 0431   CL 104 02/23/2013 0431   CO2 28 02/23/2013 0431   BUN 35* 02/23/2013 0431   CREATININE 0.90 02/23/2013 0431   GLUCOSE 109* 02/23/2013 0431   CALCIUM 9.3 02/23/2013 0431   AST 72* 02/23/2013 0431   ALT 87* 02/23/2013 0431   ALKPHOS 99 02/23/2013 0431   BILITOT 0.5 02/23/2013 0431   PROT 6.9 02/23/2013 0431   ALBUMIN 2.5* 02/23/2013 0431     Studies/Results: No results found.  Anti-infectives: Anti-infectives   Start     Dose/Rate Route Frequency Ordered Stop   02/14/13 1400  cefTRIAXone (ROCEPHIN) 2 g in dextrose 5 % 50 mL IVPB     2 g 100 mL/hr over 30 Minutes Intravenous Every 24 hours 02/14/13 1143 02/18/13 1512   02/12/13 1000  vancomycin (VANCOCIN) IVPB 1000 mg/200 mL premix  Status:  Discontinued     1,000 mg 200 mL/hr over 60 Minutes Intravenous Every 12 hours  02/12/13 0943 02/14/13 1137   02/11/13 2300  piperacillin-tazobactam (ZOSYN) IVPB 3.375 g  Status:  Discontinued     3.375 g 12.5 mL/hr over 240 Minutes Intravenous Every 8 hours 02/11/13 2156 02/14/13 1144   02/11/13 2300  vancomycin (VANCOCIN) IVPB 750 mg/150 ml premix  Status:  Discontinued     750 mg 150 mL/hr over 60 Minutes Intravenous Every 12 hours 02/11/13 2156 02/12/13 0943   02/11/13 2230  micafungin (MYCAMINE) 100 mg in sodium chloride 0.9 % 100 mL IVPB  Status:  Discontinued     100 mg 100 mL/hr over 1 Hours Intravenous Daily at bedtime 02/11/13 2137 02/13/13 1023   02/08/13 1700  ceFAZolin (ANCEF) IVPB 1 g/50 mL premix     1 g 100 mL/hr over 30 Minutes Intravenous Every 6 hours 02/08/13 1639 02/09/13 0539   02/08/13 0650  ceFAZolin (ANCEF) IVPB 2 g/50 mL premix     2 g 100 mL/hr over 30 Minutes Intravenous On call to O.R. 02/08/13 0650 02/08/13 0957      Assessment Principal Problem:   Hiatal hernia-recurrent s/p laparoscopic repair and Nissen fundoplication on 02/08/13-tolerating full liquids and some creamy pureed food Active Problems:   Bell's palsy   Atrial fibrillation-resolved   Acute respiratory failure-resolved   Protein-calorie malnutrition, TPN stopped.   Aspiration into airway with pneumonia-resolved  LOS: 16 days   Plan: Discharge.  Instructions given.   Victor Sutton J 02/24/2013

## 2013-02-25 ENCOUNTER — Telehealth (INDEPENDENT_AMBULATORY_CARE_PROVIDER_SITE_OTHER): Payer: Self-pay

## 2013-02-25 ENCOUNTER — Encounter (INDEPENDENT_AMBULATORY_CARE_PROVIDER_SITE_OTHER): Payer: Medicare Other | Admitting: General Surgery

## 2013-02-25 NOTE — Telephone Encounter (Signed)
Pt has po scheduled for 03/07/13 at 4:30pm.

## 2013-03-01 ENCOUNTER — Telehealth (INDEPENDENT_AMBULATORY_CARE_PROVIDER_SITE_OTHER): Payer: Self-pay

## 2013-03-01 NOTE — Telephone Encounter (Signed)
Victor Sutton with [facility] ask for order to continue Home Physical Therapy as written 3 times a week x 2 weeks then 2 times a week x 3 weeks. Milas Hock CMA give  Verbal approval

## 2013-03-02 ENCOUNTER — Telehealth: Payer: Self-pay | Admitting: Internal Medicine

## 2013-03-02 ENCOUNTER — Telehealth (INDEPENDENT_AMBULATORY_CARE_PROVIDER_SITE_OTHER): Payer: Self-pay

## 2013-03-02 ENCOUNTER — Other Ambulatory Visit (INDEPENDENT_AMBULATORY_CARE_PROVIDER_SITE_OTHER): Payer: Self-pay | Admitting: Surgery

## 2013-03-02 DIAGNOSIS — IMO0002 Reserved for concepts with insufficient information to code with codable children: Secondary | ICD-10-CM

## 2013-03-02 NOTE — Telephone Encounter (Signed)
Order for gastrostomy tube in Epic.  Tina in IR will schedule and call the patient's family today.

## 2013-03-02 NOTE — Telephone Encounter (Signed)
[facility] nurse calling as advocate for family.  Pt d/c after hiatal hernia repair by Dr. Abbey Chatters on 5/27.  He refused a peg tube.  Pt not eating and is refusing Ensure.  Contacted Dr. Lauro Franklin office per wife's request for peg insertion.  Waiting to hear from nurse at Dr. Lauro Franklin office.

## 2013-03-02 NOTE — Telephone Encounter (Signed)
noted 

## 2013-03-02 NOTE — Telephone Encounter (Signed)
Dr Abbey Chatters did [facility] repair in May on pt. Pt offered a PEG tube while in hospital and he refused. Family wants him to have a PEG now and Dr Abbey Chatters is off until next week; pt has stopped eating and drinking. Can we do the PEG or should she just call IR. Informed bernie, Dr Rhea Belton has no available time this week to do the PEG, we prefer IR. Cyndra Numbers will try IR and if she has problems she will call back.

## 2013-03-03 ENCOUNTER — Encounter (HOSPITAL_COMMUNITY): Payer: Self-pay | Admitting: Pharmacy Technician

## 2013-03-03 ENCOUNTER — Other Ambulatory Visit: Payer: Self-pay | Admitting: Radiology

## 2013-03-04 ENCOUNTER — Other Ambulatory Visit (INDEPENDENT_AMBULATORY_CARE_PROVIDER_SITE_OTHER): Payer: Self-pay | Admitting: General Surgery

## 2013-03-04 ENCOUNTER — Ambulatory Visit (HOSPITAL_COMMUNITY)
Admission: RE | Admit: 2013-03-04 | Discharge: 2013-03-04 | Disposition: A | Discharge status: Home / Self Care | Payer: Medicare Other | Source: Ambulatory Visit | Attending: Surgery | Admitting: Surgery

## 2013-03-04 ENCOUNTER — Telehealth (INDEPENDENT_AMBULATORY_CARE_PROVIDER_SITE_OTHER): Payer: Self-pay

## 2013-03-04 ENCOUNTER — Encounter (HOSPITAL_COMMUNITY): Payer: Self-pay

## 2013-03-04 DIAGNOSIS — I10 Essential (primary) hypertension: Secondary | ICD-10-CM | POA: Insufficient documentation

## 2013-03-04 DIAGNOSIS — R627 Adult failure to thrive: Secondary | ICD-10-CM | POA: Insufficient documentation

## 2013-03-04 DIAGNOSIS — IMO0002 Reserved for concepts with insufficient information to code with codable children: Secondary | ICD-10-CM

## 2013-03-04 DIAGNOSIS — R634 Abnormal weight loss: Secondary | ICD-10-CM | POA: Insufficient documentation

## 2013-03-04 DIAGNOSIS — N400 Enlarged prostate without lower urinary tract symptoms: Secondary | ICD-10-CM | POA: Insufficient documentation

## 2013-03-04 DIAGNOSIS — K219 Gastro-esophageal reflux disease without esophagitis: Secondary | ICD-10-CM | POA: Insufficient documentation

## 2013-03-04 DIAGNOSIS — E46 Unspecified protein-calorie malnutrition: Secondary | ICD-10-CM | POA: Insufficient documentation

## 2013-03-04 DIAGNOSIS — R1310 Dysphagia, unspecified: Secondary | ICD-10-CM | POA: Insufficient documentation

## 2013-03-04 HISTORY — DX: Malignant (primary) neoplasm, unspecified: C80.1

## 2013-03-04 LAB — CBC WITH DIFFERENTIAL/PLATELET
Basophils Absolute: 0 10*3/uL (ref 0.0–0.1)
Basophils Relative: 0 % (ref 0–1)
Eosinophils Absolute: 0.2 10*3/uL (ref 0.0–0.7)
Eosinophils Relative: 3 % (ref 0–5)
HCT: 42.4 % (ref 39.0–52.0)
Hemoglobin: 14 g/dL (ref 13.0–17.0)
Lymphocytes Relative: 19 % (ref 12–46)
Lymphs Abs: 1.4 10*3/uL (ref 0.7–4.0)
MCH: 29.3 pg (ref 26.0–34.0)
MCHC: 33 g/dL (ref 30.0–36.0)
MCV: 88.7 fL (ref 78.0–100.0)
Monocytes Absolute: 0.6 10*3/uL (ref 0.1–1.0)
Monocytes Relative: 8 % (ref 3–12)
Neutro Abs: 5.1 10*3/uL (ref 1.7–7.7)
Neutrophils Relative %: 70 % (ref 43–77)
Platelets: 344 10*3/uL (ref 150–400)
RBC: 4.78 MIL/uL (ref 4.22–5.81)
RDW: 17.1 % — ABNORMAL HIGH (ref 11.5–15.5)
WBC: 7.4 10*3/uL (ref 4.0–10.5)

## 2013-03-04 LAB — APTT: aPTT: 34 seconds (ref 24–37)

## 2013-03-04 LAB — PROTIME-INR
INR: 1.15 (ref 0.00–1.49)
Prothrombin Time: 14.5 seconds (ref 11.6–15.2)

## 2013-03-04 MED ORDER — MIDAZOLAM HCL 2 MG/2ML IJ SOLN
INTRAMUSCULAR | Status: DC | PRN
  Administered 2013-03-04 (×2): 1 mg via INTRAVENOUS

## 2013-03-04 MED ORDER — LIDOCAINE VISCOUS 2 % MT SOLN
15.0000 mL | Freq: Once | OROMUCOSAL | Status: AC
  Administered 2013-03-04: 15 mL via OROMUCOSAL
  Filled 2013-03-04: qty 15

## 2013-03-04 MED ORDER — FENTANYL CITRATE 0.05 MG/ML IJ SOLN
INTRAMUSCULAR | Status: AC
  Filled 2013-03-04: qty 6

## 2013-03-04 MED ORDER — LIDOCAINE HCL 1 % IJ SOLN
INTRAMUSCULAR | Status: AC
  Filled 2013-03-04: qty 20

## 2013-03-04 MED ORDER — IOHEXOL 300 MG/ML  SOLN
20.0000 mL | Freq: Once | INTRAMUSCULAR | Status: AC | PRN
  Administered 2013-03-04: 20 mL

## 2013-03-04 MED ORDER — FENTANYL CITRATE 0.05 MG/ML IJ SOLN
INTRAMUSCULAR | Status: DC | PRN
  Administered 2013-03-04 (×2): 50 ug via INTRAVENOUS

## 2013-03-04 MED ORDER — CEFAZOLIN SODIUM-DEXTROSE 2-3 GM-% IV SOLR
INTRAVENOUS | Status: AC
  Filled 2013-03-04: qty 50

## 2013-03-04 MED ORDER — SODIUM CHLORIDE 0.9 % IV SOLN
INTRAVENOUS | Status: DC
  Administered 2013-03-04: 500 mL via INTRAVENOUS

## 2013-03-04 MED ORDER — MIDAZOLAM HCL 2 MG/2ML IJ SOLN
INTRAMUSCULAR | Status: AC
  Filled 2013-03-04: qty 6

## 2013-03-04 MED ORDER — CEFAZOLIN SODIUM-DEXTROSE 2-3 GM-% IV SOLR
2.0000 g | Freq: Once | INTRAVENOUS | Status: AC
  Administered 2013-03-04: 2 g via INTRAVENOUS

## 2013-03-04 MED ORDER — HYDROCODONE-ACETAMINOPHEN 5-325 MG PO TABS: 1.0000 | ORAL_TABLET | ORAL | Status: DC | PRN

## 2013-03-04 MED ORDER — ONDANSETRON HCL 4 MG/2ML IJ SOLN: 4.0000 mg | INTRAMUSCULAR | Status: DC | PRN

## 2013-03-04 NOTE — Progress Notes (Signed)
Patient here to day for prep for g-tube placement in IR. Wife states they have not been given any information on what type of tube feedings he will be getting. I called [facility]( [facility]) Belenda Cruise RN and she has no orders for instructions. I also called [facility] and spoke with Arline Asp RN to request orders for [facility] and I gave her [facility] Belenda Cruise RN phone #. Bernie CMA for [facility] ([facility]) called me back and I also explained to her that patient and wife would need a home visit and orders from [facility] and gave her Kristin's contact phone # and she said she would contact her. I explained to wife that the [facility] and [facility] had been contacted by me and were discussing his home care

## 2013-03-04 NOTE — Procedures (Signed)
100f Gastrostomy tube placement No complication No blood loss. See complete dictation in [facility].

## 2013-03-04 NOTE — Progress Notes (Addendum)
Post procedure of G-tube placement with recovery in Short Stay. HOB is at 30 degrees and pt resting comfortably. Yankaur Suction at bedside and pt is using this as needed

## 2013-03-04 NOTE — Telephone Encounter (Signed)
No notes in this encounter. 

## 2013-03-04 NOTE — Progress Notes (Signed)
Spoke with Belenda Cruise RN from [facility] and Home Health visit has been arranged for this evening and she will contact [facility] to make sure they call Mrs Bohlman on her cell phone # to confirm this

## 2013-03-04 NOTE — H&P (Signed)
Chief Complaint: "I'm here for a feeding tube" Referring Physician:Rosenbower/Pyrtle HPI: Victor Sutton is an 79 y.o. male who recently underwent successful hiatal hernia repair and Nissen fundoplication. However, he has significant dysphagia and has been having great difficulty getting the nutrition he needs. He has lost weight and become malnourished. He is referred to IR for possible placement of a gastrostomy tube. States he can swallow small amounts only when he turns head to the left. He also states he is at risk for aspiration if he does not keep his head elevated at least 30 degrees. PMHx and meds reviewed. Otherwise recovering well from surgery.  Denies fevers or issues with wounds healing.  Past Medical History:  Past Medical History  Diagnosis Date  . BPH (benign prostatic hyperplasia)   . Macular degeneration     (L) wet, (R) dry  . B12 deficiency     HIstory  . 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 Leukoplakia 2001    Treated for  . Hypertension   . Iron deficiency anemia   . Cataract 2009    bilateral cataract surgery  . GERD (gastroesophageal reflux disease)   . Hyperlipidemia     not on medication  . Ulcer   . Bell's palsy     at present  . Shortness of breath   . Peripheral neuropathy     Mild History of  . Hiatal hernia     hiatial hernia repair  02/08/13  . Cancer     Past Surgical History:  Past Surgical History  Procedure Laterality Date  . Cholecystectomy    . Arthroscopic knee surgery      left  . Multiple oral surgeries  2002  . Tonsillectomy    . Cateract surgeries       bilateral  . Stomach surgery  2005    states his stomach had moved up toward his chest , some type of surgery performed  . Hernia repair      Laparoscopic ventral  . Hiatal hernia repair  2004  . Skin cancer excision  10/2012    right leg-shin area  . Hiatal hernia repair N/A 02/08/2013    Procedure:  REPAIR OFCURRENT HIATAL HERNIA, ;  Surgeon: Adolph Pollack, MD;   Location: [facility];  Service: General;  Laterality: N/A;  . Upper gi endoscopy N/A 02/08/2013    Procedure: UPPER GI ENDOSCOPY;  Surgeon: Adolph Pollack, MD;  Location: [facility];  Service: General;  Laterality: N/A;  . Laparoscopic lysis of adhesions N/A 02/08/2013    Procedure: LAPAROSCOPIC LYSIS OF ADHESIONS;  Surgeon: Adolph Pollack, MD;  Location: [facility];  Service: General;  Laterality: N/A;  . Laparoscopic nissen fundoplication  02/08/2013    Procedure: LAPAROSCOPIC NISSEN FUNDOPLICATION;  Surgeon: Adolph Pollack, MD;  Location: [facility];  Service: General;;    Family History:  Family History  Problem Relation Age of Onset  . Prostate cancer Paternal Grandfather   . Breast cancer Sister   . CAD Sister   . COPD Sister   . Heart attack Father     Social History:  reports that he quit smoking about 36 years ago. He has never used smokeless tobacco. He reports that  drinks alcohol. He reports that he does not use illicit drugs.  Allergies: No Known Allergies  Medications: No home meds listed.  Please HPI for pertinent positives, otherwise complete 10 system ROS negative.  Physical Exam: BP 146/90 There is no weight on  file to calculate BMI.   General Appearance:  Alert, cooperative, no distress, appears stated age  Head:  Normocephalic, without obvious abnormality, atraumatic  ENT: Unremarkable  Neck: Supple, symmetrical, trachea midline  Lungs:   Clear to auscultation bilaterally, no w/r/r, respirations unlabored without use of accessory muscles.  Heart:  Regular rate and rhythm, S1, S2 normal, no murmur, rub or gallop.  Abdomen:   Soft, non-tender, non distended. Multiple laparoscopic incisions healing well.  Neurologic: Normal affect, no gross deficits.   Results for orders placed during the hospital encounter of 03/04/13 (from the past 48 hour(s))  APTT     Status: None   Collection Time    03/04/13  9:55 AM      Result Value Range   aPTT 34  24 - 37 seconds  CBC  WITH DIFFERENTIAL     Status: Abnormal   Collection Time    03/04/13  9:55 AM      Result Value Range   WBC 7.4  4.0 - 10.5 K/uL   RBC 4.78  4.22 - 5.81 MIL/uL   Hemoglobin 14.0  13.0 - 17.0 g/dL   HCT 40.9  81.1 - 91.4 %   MCV 88.7  78.0 - 100.0 fL   MCH 29.3  26.0 - 34.0 pg   MCHC 33.0  30.0 - 36.0 g/dL   RDW 78.2 (*) 95.6 - 21.3 %   Platelets 344  150 - 400 K/uL   Neutrophils Relative % 70  43 - 77 %   Neutro Abs 5.1  1.7 - 7.7 K/uL   Lymphocytes Relative 19  12 - 46 %   Lymphs Abs 1.4  0.7 - 4.0 K/uL   Monocytes Relative 8  3 - 12 %   Monocytes Absolute 0.6  0.1 - 1.0 K/uL   Eosinophils Relative 3  0 - 5 %   Eosinophils Absolute 0.2  0.0 - 0.7 K/uL   Basophils Relative 0  0 - 1 %   Basophils Absolute 0.0  0.0 - 0.1 K/uL  PROTIME-INR     Status: None   Collection Time    03/04/13  9:55 AM      Result Value Range   Prothrombin Time 14.5  11.6 - 15.2 seconds   INR 1.15  0.00 - 1.49   No results found.  Assessment/Plan Failure to thrive and PCM secondary to progressive dysphagia Discussed perc G-tube placement.  Explained procedure, risks, complications, possibility of not being able to place. Explained use of sedation, side effects. Labs reviewed. Consent signed in chart  Brayton El PA-C 03/04/2013, 10:45 AM

## 2013-03-04 NOTE — Telephone Encounter (Signed)
Received instructions for skilled nursing referral to [facility] for dietician to evaluate pt.  Orders signed off on by Dr. Johna Sheriff.

## 2013-03-04 NOTE — Telephone Encounter (Signed)
Victor Sutton from shortstay at [facility] called stating Victor Sutton at [facility] called her wanting orders for peg tube feeding and care. I advised her Dr Abbey Chatters off and will have to review this with MD on call. Cyndra Numbers will call Victor Sutton at [facility] to see exactly what orders she needs and contact MD to set up orders.

## 2013-03-06 ENCOUNTER — Telehealth (INDEPENDENT_AMBULATORY_CARE_PROVIDER_SITE_OTHER): Payer: Self-pay | Admitting: Surgery

## 2013-03-06 NOTE — Telephone Encounter (Signed)
Patient status post redo paraesophageal hiatal hernia repair 02/08/2013.  Declined gastrostomy tube.  Poor by mouth intake and refused oral supplements.  Gastrostomy tube placed last week by [facility].  Wife called noting the patient is having "explosive diarrhea" after receiving can of Jevity.  They are supposed to gradually increase to six cans a day over the next few days.  He is urinating.  He is walking with a walker.  He is not complaining of worsening abdominal pain.  I recommend they switch to an electrolyte solution such as Gatorade or Pedialyte of equivalent volume.  Do that for the next 48 hours.  Okay to start Kaopectate to help thicken up stools.  I suspect he is so severely malnourished that it will take time for his digestive tract to ramp up enzymes.  Then retry Jevity.  If not getting better, consider elemental feeding or start continuous low rate feedings and adjust.  Patient and wife to see Dr. Abbey Chatters Four there are postoperative visit..   I agreed with them keeping the appointment strongly.  She expressed appreciation.

## 2013-03-07 ENCOUNTER — Encounter (INDEPENDENT_AMBULATORY_CARE_PROVIDER_SITE_OTHER): Payer: Self-pay | Admitting: General Surgery

## 2013-03-07 ENCOUNTER — Ambulatory Visit (INDEPENDENT_AMBULATORY_CARE_PROVIDER_SITE_OTHER): Payer: Medicare Other | Admitting: General Surgery

## 2013-03-07 VITALS — BP 130/90 | HR 84 | Temp 98.2°F | Resp 16 | Ht 70.0 in | Wt 180.6 lb

## 2013-03-07 DIAGNOSIS — Z9889 Other specified postprocedural states: Secondary | ICD-10-CM

## 2013-03-07 DIAGNOSIS — R1312 Dysphagia, oropharyngeal phase: Secondary | ICD-10-CM

## 2013-03-07 MED ORDER — OMEPRAZOLE 2 MG/ML ORAL SUSPENSION: 20.0000 mg | Freq: Every day | ORAL | Status: DC

## 2013-03-07 NOTE — Patient Instructions (Signed)
Resume tube feedings. Place 30 cc of Kaopectate into tube 30 minutes before feedings.  Try to swallowing her saliva as much as possible. We will call for a consult with Dr. Jearld Fenton.

## 2013-03-07 NOTE — Progress Notes (Signed)
Procedure:  Laparoscopic repair of recurrent hiatal hernia and Nissen fundoplication.  Date:  02/08/2013  Pathology:  na  History:  He is here for his first postoperative visit. He was unable to swallow well enough at home and is still having difficulty with even swallow his saliva which is something new since discharge. He had a gastrostomy tube placed by interventional radiology. He was started on Jevity. He had some diarrhea from that so he stopped that has been taking some Gatorade. He feels like he may be having some heartburn. He is able to belch. His voice is changed. Of note was that he had oropharyngeal dysphagia from Bell's palsy preoperatively which had been improving.  Exam: General- Is in NAD.  Neck-no adenopathy or swelling. His voice is definitely altered.  Abdomen-gastrostomy tube in left upper quadrant. Incision is clean and intact.  Assessment:  Status post laparoscopic repair of recurrent hiatal hernia and Nissen fundoplication with postoperative aspiration complications as well as deconditioned state and oropharyngeal dysphagia.  This dysphagia seems to be worsening. Speech therapist has seen him and will be seeing him later this week. Physical therapy is seeing him at home.  Plan:  Arrange for urgent ENT consult with Dr. Jearld Fenton. I told him to restart her Jevity but take Kaopectate 30 minutes before starting the Jevity to see if this would help with the diarrhea. I told him he needs to swallow his saliva (he's been spitting it out).  We'll try to work on a nutritional consultation. Return visit 2 weeks.

## 2013-03-08 ENCOUNTER — Encounter (INDEPENDENT_AMBULATORY_CARE_PROVIDER_SITE_OTHER): Payer: Self-pay | Admitting: General Surgery

## 2013-03-08 ENCOUNTER — Other Ambulatory Visit (INDEPENDENT_AMBULATORY_CARE_PROVIDER_SITE_OTHER): Payer: Self-pay | Admitting: General Surgery

## 2013-03-08 ENCOUNTER — Telehealth (INDEPENDENT_AMBULATORY_CARE_PROVIDER_SITE_OTHER): Payer: Self-pay | Admitting: *Deleted

## 2013-03-08 ENCOUNTER — Telehealth (INDEPENDENT_AMBULATORY_CARE_PROVIDER_SITE_OTHER): Payer: Self-pay

## 2013-03-08 DIAGNOSIS — R627 Adult failure to thrive: Secondary | ICD-10-CM

## 2013-03-08 DIAGNOSIS — R1310 Dysphagia, unspecified: Secondary | ICD-10-CM

## 2013-03-08 NOTE — Telephone Encounter (Signed)
Called pt to confirm appt with Dr. Jearld Fenton tomorrow 03/09/13.  She stated Dr. Abbey Chatters had contacted her.  She also shared pt has appt with Dr. Clelia Croft on 03/15/13.

## 2013-03-08 NOTE — Progress Notes (Signed)
Patient ID: Victor Sutton, male   DOB: 08/06/1928, 79 y.o.   MRN: [id number] I spoke with Dr. Jearld Fenton regarding [name] this morning. I also talked with Dr. Clelia Croft. I got an appointment for [name] to see Dr. Jearld Fenton tomorrow at 10 AM to evaluate the oropharyngeal dysphagia. Dr. Clelia Croft is also going to get involved with the evaluation.  I discussed this with [name].

## 2013-03-08 NOTE — Telephone Encounter (Signed)
Home health referral faxed to [facility] at [phone] confirmation received. Patient is already a patient with [facility] and needs dietary consult. [facility] to contact us if there is a problem with this referral.

## 2013-03-08 NOTE — Telephone Encounter (Signed)
Stephanie with [facility] called to state that the Prilosec supp is not covered under patients insurance so it will cost $120. She is asking if she can substitute with Ranitidine which comes in a supp and is only $7.  Explained that Dr. Abbey Chatters is unavailable this afternoon but will call her back tomorrow to let her know.  Judeth Cornfield states understanding at this time.

## 2013-03-09 ENCOUNTER — Other Ambulatory Visit (INDEPENDENT_AMBULATORY_CARE_PROVIDER_SITE_OTHER): Payer: Self-pay | Admitting: *Deleted

## 2013-03-09 ENCOUNTER — Telehealth (INDEPENDENT_AMBULATORY_CARE_PROVIDER_SITE_OTHER): Payer: Self-pay | Admitting: *Deleted

## 2013-03-09 DIAGNOSIS — K219 Gastro-esophageal reflux disease without esophagitis: Secondary | ICD-10-CM

## 2013-03-09 MED ORDER — RANITIDINE HCL 150 MG/10ML PO SYRP: 75.0000 mg | ORAL_SOLUTION | Freq: Two times a day (BID) | ORAL | Status: DC

## 2013-03-09 NOTE — Telephone Encounter (Signed)
That would be fine 

## 2013-03-09 NOTE — Telephone Encounter (Signed)
Called in Zantac supp 75mg  BID #300 with 1 refill. Given to [place] [phone]

## 2013-03-09 NOTE — Telephone Encounter (Signed)
Allen Derry (speech therapist) called to request a verbal order to begin speech therapy July 7th, 2014 with this patient.  She states she will be out of town until then however the order will expire between now and then.

## 2013-03-09 NOTE — Telephone Encounter (Signed)
Noted  

## 2013-03-10 NOTE — Telephone Encounter (Signed)
Okay to order therapy as requested.

## 2013-03-10 NOTE — Telephone Encounter (Signed)
Left message for Revonda Standard that it was approved to begin speech therapy as she requested.

## 2013-03-14 NOTE — Discharge Summary (Signed)
Physician Discharge Summary  Patient ID: Victor Sutton MRN: [id number] DOB/AGE: 21-Mar-1928 79 y.o.  Admit date: 02/08/2013 Discharge date: 02/24/2013  Admission Diagnoses:  Recurrent hiatal hernia and GERD  Discharge Diagnoses:  Principal Problem:   Hiatal hernia-recurrent s/p laparoscopic repair and Nissen fundoplication on 02/08/13 Active Problems:   Bell's palsy   GERD (gastroesophageal reflux disease)   Atrial fibrillation   Acute respiratory failure   Protein-calorie malnutrition, severe   Aspiration pneumonia   Oropharyngeal dysphagia   Deconditioned state      Discharged Condition: fair  Hospital Course: He was admitted and underwent the above procedure.  He had Bell's palsy in the spring but he stated he was not having difficulty swallowing at the time of surgery.  In the PACU he had respiratory distress and rapid afib and have to be reintubated.  He was taken to the ICU and CCM and Cardiology consultations were obtained.  He was started on an Amiodarone drip which resolved his afib.  This was able to be discontinued.  He was extubated on 5/28 and tolerated it well.  On 5/29 he underwent an UGI study.  During the exam, while in the erect position, he was able to swallow without  difficulty, but when placed in the supine position he aspirated and went into respiratory distress again.  The study was aborted.  He was started on BIPAP and his respiratory status improved. However, he aspirated again on 02/11/13 during an attempted PICC procedure and went into rapid afib and had respiratory distress. He was reintubated and bronchoscopy was performed.  His amiodarone was restarted and the afib resolved (afib was felt to be secondary to the stress of the respiratory distress).  CXR was suggestive of aspiration pneumonia and he was started on appropriate abxs.    He was able to be extubated again.  Speech Therapy was consulted.  TPN was started.  He had a chronically elevated right  hemidiaphragm and the sniff test was negative for dysfunction.  His atrial fibrillation resolved.  His ST evaluation demonstrated oropharyngeal dysphagia with risk of aspiration.  He was kept NPO.  PT and OT consult were obtained for his deconditioned state.  He was transferred to the floor.  His TPN, abxs, and therapies continued.  Eventually, he was able to swallow liquids safely in a very specific manner.  His antibiotics were stopped after adequate treatment of the pneumonia.  He slowly regained his strength.  He was eventually able to be discharged to home with home health care and on a modified diet on 02/24/13.  Specific discharge instructions were given to him and his wife.      Consults: cardiology, pulmonology, PT/OT/ST  Significant Diagnostic Studies: bronchoscopy:   Treatments: surgery: Laparoscopic repair of recurrent hiatal hernia and Nissen fundoplication  Discharge Exam: Blood pressure 114/71, pulse 89, temperature 97.5 F (36.4 C), temperature source Oral, resp. rate 18, height 5\' 10"  (1.778 m), weight 183 lb 6.8 oz (83.2 kg), SpO2 93.00%.   Disposition: 01-Home or Self Care  Discharge Orders   Future Appointments Provider Department Dept Phone   03/23/2013 10:50 AM Adolph Pollack, MD [facility], [place] [phone]   Future Orders Complete By Expires     Ambulatory referral to Home Health  As directed     Comments:      Please evaluate Arvin Collard for admission to [facility].  Disciplines requested: Physical Therapy  Services to provide: Strengthening Exercises  Physician to follow patient's care (the  person listed here will be responsible for signing ongoing orders): Other: Oncologist of Care Date: Tomorrow  Special Instructions:  Home health PT, OT, ST.        Medication List    STOP taking these medications       acetaminophen 325 MG tablet  Commonly known as:  TYLENOL     ICAPS MV PO     mupirocin ointment 2 %   Commonly known as:  BACTROBAN     OVER THE COUNTER MEDICATION     pantoprazole 40 MG tablet  Commonly known as:  PROTONIX     tamsulosin 0.4 MG Caps  Commonly known as:  FLOMAX     VESICARE 5 MG tablet  Generic drug:  solifenacin         Signed: Antasia Haider J 03/14/2013, 11:01 AM

## 2013-03-16 ENCOUNTER — Encounter: Payer: Self-pay | Admitting: Neurology

## 2013-03-16 ENCOUNTER — Ambulatory Visit (INDEPENDENT_AMBULATORY_CARE_PROVIDER_SITE_OTHER): Payer: Medicare Other | Admitting: Neurology

## 2013-03-16 VITALS — BP 135/82 | HR 53 | Ht 69.75 in | Wt 184.0 lb

## 2013-03-16 DIAGNOSIS — R1314 Dysphagia, pharyngoesophageal phase: Secondary | ICD-10-CM

## 2013-03-16 DIAGNOSIS — R490 Dysphonia: Secondary | ICD-10-CM

## 2013-03-16 HISTORY — DX: Dysphagia, pharyngoesophageal phase: R13.14

## 2013-03-16 HISTORY — DX: Dysphonia: R49.0

## 2013-03-16 NOTE — Progress Notes (Signed)
Reason for visit: Dysphagia  Victor Sutton is a 79 y.o. male  History of present illness:  Victor Sutton is an 79 year old right-handed white male with a history of onset of what was felt to be a right-sided Bell's palsy on 01/01/2013. The patient had right-sided facial weakness, and he had a droopy eyelid on that side as well. The patient recovered his facial strength and ptosis within several weeks. Since that time, the patient had onset of dysphonia and dysarthria that has persisted. The patient has a chronic issue with severe gastroesophageal reflux disease associated with a hiatal hernia. The patient has had chronic reflux issues and problems with pneumonia. The patient has eventually been set up for a swallowing study showing severe esophageal phase dysphagia. The patient has had a feeding tube placed, and he has begun to gain weight. The patient has continued to have dysphonia, and he has been noted to have incomplete closure of the vocal cords associated with this. The patient denies any numbness or weakness of the extremities, but he does note a slight balance problem. The patient denies any issues controlling the bladder, but he does have some occasional diarrhea associated with the tube feeds. The patient denied any alteration in taste or headache with the onset of the Bell's palsy. The patient had a CT scan of the brain at that time that appeared to be relatively unremarkable. The patient is sent to this office for an evaluation. The patient denies double vision. No cognitive changes have been noted.  Past Medical History  Diagnosis Date  . BPH (benign prostatic hyperplasia)   . Macular degeneration     (L) wet, (R) dry  . B12 deficiency     HIstory  . 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 Leukoplakia 2001    Treated for  . Hypertension   . Iron deficiency anemia   . Cataract 2009    bilateral cataract surgery  . GERD (gastroesophageal reflux disease)   . Hyperlipidemia     not  on medication  . Ulcer   . Bell's palsy     at present  . Shortness of breath   . Peripheral neuropathy     Mild History of  . Hiatal hernia     hiatial hernia repair  02/08/13  . Cancer   . Dysphonia 03/16/2013  . Dysphagia, pharyngoesophageal phase 03/16/2013    Past Surgical History  Procedure Laterality Date  . Cholecystectomy    . Arthroscopic knee surgery      left  . Multiple oral surgeries  2002  . Tonsillectomy    . Cateract surgeries       bilateral  . Stomach surgery  2005    states his stomach had moved up toward his chest , some type of surgery performed  . Hernia repair      Laparoscopic ventral  . Hiatal hernia repair  2004  . Skin cancer excision  10/2012    right leg-shin area  . Hiatal hernia repair N/A 02/08/2013    Procedure:  REPAIR OFCURRENT HIATAL HERNIA, ;  Surgeon: Adolph Pollack, MD;  Location: [facility];  Service: General;  Laterality: N/A;  . Upper gi endoscopy N/A 02/08/2013    Procedure: UPPER GI ENDOSCOPY;  Surgeon: Adolph Pollack, MD;  Location: [facility];  Service: General;  Laterality: N/A;  . Laparoscopic lysis of adhesions N/A 02/08/2013    Procedure: LAPAROSCOPIC LYSIS OF ADHESIONS;  Surgeon: Adolph Pollack, MD;  Location:  [facility];  Service: General;  Laterality: N/A;  . Laparoscopic nissen fundoplication  02/08/2013    Procedure: LAPAROSCOPIC NISSEN FUNDOPLICATION;  Surgeon: Adolph Pollack, MD;  Location: [facility];  Service: General;;  . Gastrostomy w/ feeding tube  [id number]    Family History  Problem Relation Age of Onset  . Prostate cancer Paternal Grandfather   . Breast cancer Sister   . CAD Sister   . COPD Sister   . Heart attack Father     Social history:  reports that he quit smoking about 36 years ago. He has never used smokeless tobacco. He reports that  drinks alcohol. He reports that he does not use illicit drugs.  Medications:  Current Outpatient Prescriptions on File Prior to Visit  Medication Sig Dispense Refill  .  ranitidine (ZANTAC) 150 MG/10ML syrup Take 5 mLs (75 mg total) by mouth 2 (two) times daily.  300 mL  1   No current facility-administered medications on file prior to visit.    Allergies: No Known Allergies  ROS:  Out of a complete 14 system review of symptoms, the patient complains only of the following symptoms, and all other reviewed systems are negative.  Weight loss Difficulty swallowing Shortness of breath, cough Feeling hot, cold Slurred speech Snoring  Blood pressure 135/82, pulse 53, height 5' 9.75" (1.772 m), weight 184 lb (83.462 kg).  Physical Exam  General: The patient is alert and cooperative at the time of the examination.  Head: Pupils are equal, round, and reactive to light. Discs are flat bilaterally.  Neck: The neck is supple, no carotid bruits are noted.  Respiratory: The respiratory examination is notable for occasional rhonchi in the left anterior lung fields.  Cardiovascular: The cardiovascular examination reveals a regular rate and rhythm, no obvious murmurs or rubs are noted.  Skin: Extremities are notable for 1+ edema at the ankles bilaterally.  Neurologic Exam  Mental status:  Cranial nerves: Facial symmetry is present. There is good sensation of the face to pinprick and soft touch bilaterally. The strength of the facial muscles and the muscles to head turning and shoulder shrug are normal bilaterally, with the exception that there is mild bilateral weakness with eye closure. Jaw opening and closing is normal.. Speech is well enunciated, no aphasia or dysarthria is noted. Speech is dysphonic. Extraocular movements are full. Visual fields are full.  Motor: The motor testing reveals 5 over 5 strength of all 4 extremities. Good symmetric motor tone is noted throughout.  Sensory: Sensory testing is intact to pinprick, soft touch, vibration sensation, and position sense on all 4 extremities. No evidence of extinction is noted.  Coordination:  Cerebellar testing reveals good finger-nose-finger and heel-to-shin bilaterally. A mild tremor is noted with the right upper and right lower extremities.  Gait and station: Gait is normal. Tandem gait is unsteady. Romberg is negative. No drift is seen.  Reflexes: Deep tendon reflexes are symmetric and normal bilaterally. Toes are downgoing bilaterally.   Assessment/Plan:  1. Dysphagia  2. Dysphonia  3. Questionable right-sided Bell's palsy  The patient indicates that the trouble with swallowing began around the onset of the right facial weakness. The patient noted ptosis with the onset of the facial weakness, which generally does not occur with Bell's palsy. The patient will need to be evaluated for cerebrovascular disease affecting the brainstem area, or a possible carotid dissection. Other entities such as polymyositis, myasthenia gravis, and ALS need to be considered. The patient will be set up for  blood work today. The patient will undergo MRI evaluation of the brain, and MRA of the head. The patient will undergo a carotid Doppler study. The patient will followup in 3 months.  Marlan Palau MD 03/16/2013 9:03 PM  [facility] [address] [place], [place] [address]  Phone [phone] Fax [phone]

## 2013-03-21 ENCOUNTER — Encounter: Payer: Self-pay | Admitting: Neurology

## 2013-03-21 ENCOUNTER — Telehealth: Payer: Self-pay | Admitting: Neurology

## 2013-03-21 DIAGNOSIS — G7000 Myasthenia gravis without (acute) exacerbation: Secondary | ICD-10-CM

## 2013-03-21 HISTORY — DX: Myasthenia gravis without (acute) exacerbation: G70.00

## 2013-03-21 LAB — CK: Total CK: 38 U/L (ref 24–204)

## 2013-03-21 LAB — TSH: TSH: 2.51 u[IU]/mL (ref 0.450–4.500)

## 2013-03-21 LAB — ACETYLCHOLINE RECEPTOR, BINDING: AChR Binding Ab, Serum: 25.8 nmol/L — ABNORMAL HIGH (ref 0.00–0.24)

## 2013-03-21 LAB — ANGIOTENSIN CONVERTING ENZYME: Angio Convert Enzyme: 36 U/L (ref 14–82)

## 2013-03-21 LAB — VITAMIN B12: Vitamin B-12: 727 pg/mL (ref 211–946)

## 2013-03-21 LAB — ANA W/REFLEX: Anti Nuclear Antibody(ANA): NEGATIVE

## 2013-03-21 LAB — SEDIMENTATION RATE: Sed Rate: 9 mm/hr (ref 0–30)

## 2013-03-21 LAB — LYME, TOTAL AB TEST/REFLEX: Lyme Ab: <0.91 index (ref 0.00–0.90)

## 2013-03-21 MED ORDER — PREDNISONE 5 MG PO TABS: ORAL_TABLET | ORAL | Status: DC

## 2013-03-21 MED ORDER — PYRIDOSTIGMINE BROMIDE 60 MG PO TABS: 30.0000 mg | ORAL_TABLET | Freq: Three times a day (TID) | ORAL | Status: DC

## 2013-03-21 NOTE — Telephone Encounter (Signed)
I called the wife. The blood work is unremarkable with exception that there is an elevated acetylcholine receptor antibody consistent with the diagnosis of myasthenia gravis. We'll have the patient set up for a CT scan of the chest, and begin prednisone and Mestinon. We will cancel the carotid Doppler study, and MRI evaluation.

## 2013-03-22 ENCOUNTER — Other Ambulatory Visit: Payer: Medicare Other

## 2013-03-23 ENCOUNTER — Ambulatory Visit (INDEPENDENT_AMBULATORY_CARE_PROVIDER_SITE_OTHER): Payer: Medicare Other | Admitting: General Surgery

## 2013-03-23 ENCOUNTER — Encounter (INDEPENDENT_AMBULATORY_CARE_PROVIDER_SITE_OTHER): Payer: Self-pay | Admitting: General Surgery

## 2013-03-23 VITALS — BP 122/84 | HR 88 | Temp 98.2°F | Resp 16 | Ht 69.5 in | Wt 188.6 lb

## 2013-03-23 DIAGNOSIS — Z9889 Other specified postprocedural states: Secondary | ICD-10-CM

## 2013-03-23 NOTE — Progress Notes (Signed)
Procedure:  Laparoscopic repair of recurrent hiatal hernia and Nissen fundoplication.  Date:  02/08/2013  Pathology:  na  History:  He is here for another postoperative visit.  He has been diagnosed with myasthenia gravis and is on medical treatment for this. His swelling has not improved. He continues with the gastrostomy feedings. He is doing one can 6 times a day. The Kaopectate prior to the feeding has helped with the diarrhea.  Exam: General- Is in NAD.  Abdomen-gastrostomy tube in left upper quadrant. Incisions clean and intact.  Assessment:  Status post laparoscopic repair of recurrent hiatal hernia and Nissen fundoplication with postoperative aspiration complications as well as deconditioned state and oropharyngeal dysphagia.    He has been diagnosed with myasthenia gravis and is currently undergoing medical treatment for this. He is tolerating his tube feedings better.  Plan: Continue light activities. Once he is more comfortable, he could try 1.5 cans of tube feeds 4 times a day. Return visit one month.

## 2013-03-23 NOTE — Patient Instructions (Addendum)
Once you feel more comfortable, you can try 1.5 cans every 4 hours.  Continue light activities.

## 2013-03-25 ENCOUNTER — Telehealth: Payer: Self-pay | Admitting: Neurology

## 2013-03-25 ENCOUNTER — Ambulatory Visit
Admission: RE | Admit: 2013-03-25 | Discharge: 2013-03-25 | Disposition: A | Discharge status: Home / Self Care | Payer: Medicare Other | Source: Ambulatory Visit | Attending: Neurology | Admitting: Neurology

## 2013-03-25 DIAGNOSIS — G7000 Myasthenia gravis without (acute) exacerbation: Secondary | ICD-10-CM

## 2013-03-25 NOTE — Telephone Encounter (Signed)
I called the patient. The CT of the chest shows no thymoma.

## 2013-03-28 ENCOUNTER — Telehealth: Payer: Self-pay | Admitting: Neurology

## 2013-03-28 NOTE — Telephone Encounter (Signed)
I called and LMVM for pt re: information about ST on this pt.   I gave verbal information (LMVM) or we can fax ofv note.  Let me know.

## 2013-04-04 ENCOUNTER — Telehealth: Payer: Self-pay | Admitting: Neurology

## 2013-04-05 NOTE — Telephone Encounter (Signed)
I spoke to wife and the prescription for prednisone calls for an increase up to 8 tabs daily.  The patient has a feeding tube and the wife wanted to know if they could spread those out during the day.  The patient also has swollen ankles even with compression hose on, and is having diarrhea since the new medications were prescribed and feeling nauseated in mornings after first tube feed.  Her other concern is follow up appt could not be scheduled until January.  [phone]

## 2013-04-05 NOTE — Telephone Encounter (Signed)
I called the patient and I talked with the wife. The prednisone may be spread out with the dosing. The Mestinon is causing some diarrhea, but the patient is on tube feeds, and he was having some issues with diarrhea already. The patient will need to be seen in about 3 months.

## 2013-04-20 ENCOUNTER — Ambulatory Visit (INDEPENDENT_AMBULATORY_CARE_PROVIDER_SITE_OTHER): Payer: Medicare Other | Admitting: General Surgery

## 2013-04-20 ENCOUNTER — Other Ambulatory Visit: Payer: Self-pay

## 2013-04-20 ENCOUNTER — Encounter (INDEPENDENT_AMBULATORY_CARE_PROVIDER_SITE_OTHER): Payer: Self-pay | Admitting: General Surgery

## 2013-04-20 VITALS — BP 136/84 | HR 98 | Temp 97.7°F | Resp 15 | Ht 70.0 in | Wt 188.8 lb

## 2013-04-20 DIAGNOSIS — Z9889 Other specified postprocedural states: Secondary | ICD-10-CM

## 2013-04-20 NOTE — Patient Instructions (Signed)
Try taking Pepto-Bismol 30-60 minutes before the first feeding and before you go walking.

## 2013-04-20 NOTE — Progress Notes (Signed)
Procedure:  Laparoscopic repair of recurrent hiatal hernia and Nissen fundoplication.  Date:  02/08/2013  Pathology:  na  History:  [name] is having increasing difficulty with salivation. The Mestinon he is taking has increased his saliva output and he has difficulty swallowing. He is also having some intermittent heartburn. He is taking Zantac liquid by way of the G-tube twice a day for this. He has tried to do some walking and is having some problems with diarrhea again. The Kaopectate did not help. Exam: General- Is in NAD.  Abdomen-gastrostomy tube in left upper quadrant is clean. Incisions clean and intact.  Assessment:  Status post laparoscopic repair of recurrent hiatal hernia and Nissen fundoplication with postoperative aspiration complications as well as deconditioned state and oropharyngeal dysphagia.    He has myasthenia gravis and is currently undergoing medical treatment for this with Mestinon and Prednisone.  He is having increased salivation. He is tolerating his tube feedings better.  He is having some reflux.  Plan: Try Pepto-Bismol twice a day.  Continue Zantac. RTC in one month.

## 2013-05-18 ENCOUNTER — Telehealth (INDEPENDENT_AMBULATORY_CARE_PROVIDER_SITE_OTHER): Payer: Self-pay | Admitting: General Surgery

## 2013-05-18 NOTE — Telephone Encounter (Signed)
Pt's wife called to report very small amount of red blood on dressing around husband's G-tube.  This has just begun.  Denies active bleeding, oozing or trickling of blood or bloody drainage from the site.  She is cleaning the area daily and will continue to monitor the site.  Reassured her to clean and dress as needed and keep scheduled appt with Dr. Abbey Chatters on 05/24/13.  Call if bleeding increases or becomes continuous.  She understands and agrees.

## 2013-05-18 NOTE — Telephone Encounter (Signed)
This is most likely granulation tissue around the tube site.  If the bleeding continues, he should be seen in the office to see if it can be cauterized with silver nitrate sticks.

## 2013-05-18 NOTE — Telephone Encounter (Signed)
Called wife and related the message from Dr. Abbey Chatters about granulation tissue formation.  She understands when to call back prn.

## 2013-05-23 ENCOUNTER — Other Ambulatory Visit: Payer: Self-pay

## 2013-05-23 MED ORDER — PREDNISONE 5 MG PO TABS: ORAL_TABLET | ORAL | Status: DC

## 2013-05-24 ENCOUNTER — Telehealth: Payer: Self-pay | Admitting: Neurology

## 2013-05-24 ENCOUNTER — Encounter (INDEPENDENT_AMBULATORY_CARE_PROVIDER_SITE_OTHER): Payer: Self-pay | Admitting: General Surgery

## 2013-05-24 ENCOUNTER — Ambulatory Visit (INDEPENDENT_AMBULATORY_CARE_PROVIDER_SITE_OTHER): Payer: Medicare Other | Admitting: General Surgery

## 2013-05-24 VITALS — BP 138/78 | HR 84 | Resp 16 | Ht 70.0 in | Wt 186.4 lb

## 2013-05-24 DIAGNOSIS — K219 Gastro-esophageal reflux disease without esophagitis: Secondary | ICD-10-CM

## 2013-05-24 MED ORDER — ONDANSETRON HCL 4 MG PO TABS: 4.0000 mg | ORAL_TABLET | Freq: Three times a day (TID) | ORAL | Status: DC | PRN

## 2013-05-24 NOTE — Patient Instructions (Signed)
Call Dr. Anne Hahn' office and tell them about the excessive salivation.

## 2013-05-24 NOTE — Telephone Encounter (Signed)
I called patient. I talked with the wife. The patient is having excessive salivation from the Mestinon. He is to stop the medication at this point, he is only on 30 mg 3 times daily. The patient still has some dysphagia associated with the myasthenia gravis. The patient is ramping up on his prednisone. He will be seen in October 2014. If he is not getting significant benefit from the prednisone, plasma exchange or IVIG may be used to treat the myasthenia. The patient eventually will go on CellCept. The Mestinon may be used in the future when his swallowing improves.

## 2013-05-24 NOTE — Progress Notes (Signed)
Subjective:     Patient ID: Victor Sutton, male   DOB: [birth year], 79 y.o.   MRN: [id number]  HPI  He returns for a followup visit. He is still having problems with some gastroesophageal reflux. This is a global problem with his myasthenia likely causing delayed gastric emptying and some esophageal dysmotility.  He is having hypersalivation from the Mestinon.  Has had some bleeding around the g-tube.   Review of Systems Diarrhea after tube feedings.      Objective:   Physical Exam General-salivating excessively here in the office.  Abdomen soft, G-tube site has granulation tissue present which was treated with silver nitrate.  Has tube feedings refluxing up the G-tube despite having feeding 3 hours ago.    Assessment:     Still having some reflux symptoms.  Has clinical evidence of delayed gastric emptying. Has hypersalivation likely secondary to the Mestinon side effect. Granulation tissue around the G-tube treated.     Plan:     Told him to call the office of Dr. Anne Hahn to see if there is anything we can do hypersalivation as it relates to his Mestinon. We'll give him some Zofran as he says he is having some nausea.  Return visit 2 months.

## 2013-06-20 ENCOUNTER — Encounter: Payer: Self-pay | Admitting: Neurology

## 2013-06-20 ENCOUNTER — Ambulatory Visit (INDEPENDENT_AMBULATORY_CARE_PROVIDER_SITE_OTHER): Payer: Medicare Other | Admitting: Neurology

## 2013-06-20 VITALS — BP 140/85 | HR 101 | Wt 186.0 lb

## 2013-06-20 DIAGNOSIS — Z5181 Encounter for therapeutic drug level monitoring: Secondary | ICD-10-CM

## 2013-06-20 DIAGNOSIS — G7000 Myasthenia gravis without (acute) exacerbation: Secondary | ICD-10-CM

## 2013-06-20 DIAGNOSIS — R490 Dysphonia: Secondary | ICD-10-CM

## 2013-06-20 DIAGNOSIS — K117 Disturbances of salivary secretion: Secondary | ICD-10-CM

## 2013-06-20 DIAGNOSIS — R1314 Dysphagia, pharyngoesophageal phase: Secondary | ICD-10-CM

## 2013-06-20 HISTORY — DX: Disturbances of salivary secretion: K11.7

## 2013-06-20 MED ORDER — PREDNISONE 20 MG PO TABS: 40.0000 mg | ORAL_TABLET | Freq: Every day | ORAL | Status: DC

## 2013-06-20 MED ORDER — MYCOPHENOLATE MOFETIL 500 MG PO TABS: 500.0000 mg | ORAL_TABLET | Freq: Two times a day (BID) | ORAL | Status: DC

## 2013-06-20 NOTE — Progress Notes (Signed)
Reason for visit: Myasthenia gravis  Victor Sutton is an 79 y.o. male  History of present illness:  Victor Sutton is an 79 year old right-handed white male with a history of myasthenia gravis associated with dysphagia. The patient is being fed through a feeding tube. The patient denies any double vision or ptosis. The patient has been on prednisone currently on 35 mg daily. The patient was placed on Mestinon, but this resulted in increased saliva production, but coming off of the medication has not helped much. The patient still has issues with swallowing, and he has not improved much since last seen. The patient had a CT scan of the chest that was unremarkable. The patient returns for an evaluation. He denies any weakness of the extremities.  Past Medical History  Diagnosis Date  . BPH (benign prostatic hyperplasia)   . Macular degeneration     (L) wet, (R) dry  . B12 deficiency     HIstory  . 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 Leukoplakia 2001    Treated for  . Hypertension   . Iron deficiency anemia   . Cataract 2009    bilateral cataract surgery  . GERD (gastroesophageal reflux disease)   . Hyperlipidemia     not on medication  . Ulcer   . Bell's palsy     at present  . Shortness of breath   . Peripheral neuropathy     Mild History of  . Hiatal hernia     hiatial hernia repair  02/08/13  . Cancer   . Dysphonia 03/16/2013  . Dysphagia, pharyngoesophageal phase 03/16/2013  . Myasthenia gravis 03/21/2013  . Disturbance of salivary secretion 06/20/2013    Past Surgical History  Procedure Laterality Date  . Cholecystectomy    . Arthroscopic knee surgery      left  . Multiple oral surgeries  2002  . Tonsillectomy    . Cateract surgeries       bilateral  . Stomach surgery  2005    states his stomach had moved up toward his chest , some type of surgery performed  . Hernia repair      Laparoscopic ventral  . Hiatal hernia repair  2004  . Skin cancer excision  10/2012     right leg-shin area  . Hiatal hernia repair N/A 02/08/2013    Procedure:  REPAIR OFCURRENT HIATAL HERNIA, ;  Surgeon: Adolph Pollack, MD;  Location: [facility];  Service: General;  Laterality: N/A;  . Upper gi endoscopy N/A 02/08/2013    Procedure: UPPER GI ENDOSCOPY;  Surgeon: Adolph Pollack, MD;  Location: [facility];  Service: General;  Laterality: N/A;  . Laparoscopic lysis of adhesions N/A 02/08/2013    Procedure: LAPAROSCOPIC LYSIS OF ADHESIONS;  Surgeon: Adolph Pollack, MD;  Location: [facility];  Service: General;  Laterality: N/A;  . Laparoscopic nissen fundoplication  02/08/2013    Procedure: LAPAROSCOPIC NISSEN FUNDOPLICATION;  Surgeon: Adolph Pollack, MD;  Location: [facility];  Service: General;;  . Gastrostomy w/ feeding tube  [id number]    Family History  Problem Relation Age of Onset  . Prostate cancer Paternal Grandfather   . Breast cancer Sister   . CAD Sister   . COPD Sister   . Heart attack Father     Social history:  reports that he quit smoking about 36 years ago. He has never used smokeless tobacco. He reports that  drinks alcohol. He reports that he does not use illicit drugs.  No Known Allergies  Medications:  Current Outpatient Prescriptions on File Prior to Visit  Medication Sig Dispense Refill  . Banana Flakes (BANATROL) PACK Take by mouth 2 times daily at 12 noon and 4 pm.      . Omega-3 Fatty Acids (GNP FISH OIL CONCENTRATE PO) Take 400 mg by mouth.      . ondansetron (ZOFRAN) 4 MG tablet Take 1 tablet (4 mg total) by mouth every 8 (eight) hours as needed for nausea.  30 tablet  0  . ranitidine (ZANTAC) 150 MG/10ML syrup Take 5 mLs (75 mg total) by mouth 2 (two) times daily.  300 mL  1  . pyridostigmine (MESTINON) 60 MG tablet Take 0.5 tablets (30 mg total) by mouth 3 (three) times daily.  50 tablet  3   No current facility-administered medications on file prior to visit.    ROS:  Out of a complete 14 system review of symptoms, the patient complains only of  the following symptoms, and all other reviewed systems are negative.  Fatigue Swelling in the legs Difficulty swallowing Cough, snoring Diarrhea Feeling hot Weakness, slurred speech, difficulty swallowing Anxiety, decreased energy, disinterest in activities  Blood pressure 140/85, pulse 101, weight 186 lb (84.369 kg).  Physical Exam  General: The patient is alert and cooperative at the time of the examination.  Skin: 1-2+ edema at the ankles is noted bilaterally.   Neurologic Exam   Mental status: Mini-Mental status examination done today shows a total score 27/30.  Cranial nerves: Facial symmetry is present. Speech is normal, no aphasia or dysarthria is noted. Extraocular movements are full. Visual fields are full. With superior gaze for 1 minute, no ptosis or hypertensive gaze is noted. There is no subjective double vision. The patient appears to have good strength of the tongue, and strength with jaw opening and closure.  Motor: The patient has good strength in all 4 extremities. With the arms outstretched 1 minute, no fatigable weakness of the deltoid muscles is noted.  Coordination: The patient has good finger-nose-finger and heel-to-shin bilaterally.  Gait and station: The patient has a slightly wide-based gait. Tandem gait is slightly unsteady. Romberg is negative. No drift is seen.  Reflexes: Deep tendon reflexes are symmetric.   Assessment/Plan:  1. Myasthenia gravis  2. Dysphagia, dysphonia secondary to #1  The patient has not responded so far to medical therapy. The patient will be placed on CellCept, and he will be set up for IVIG therapy. The patient will be set up for Botox injections of the parotid glands. The patient followup in 3 months. Blood work will be done today.  Marlan Palau MD 06/20/2013 7:19 PM  [facility] [address] [place], [place] [address]  Phone [phone] Fax [phone]

## 2013-06-21 ENCOUNTER — Telehealth: Payer: Self-pay | Admitting: Neurology

## 2013-06-21 LAB — COMPREHENSIVE METABOLIC PANEL
ALT: 132 IU/L — ABNORMAL HIGH (ref 0–44)
AST: 64 IU/L — ABNORMAL HIGH (ref 0–40)
Albumin/Globulin Ratio: 1.7 (ref 1.1–2.5)
Albumin: 4 g/dL (ref 3.5–4.7)
Alkaline Phosphatase: 60 IU/L (ref 39–117)
BUN/Creatinine Ratio: 27 — ABNORMAL HIGH (ref 10–22)
BUN: 24 mg/dL (ref 8–27)
CO2: 32 mmol/L — ABNORMAL HIGH (ref 18–29)
Calcium: 9.4 mg/dL (ref 8.6–10.2)
Chloride: 102 mmol/L (ref 97–108)
Creatinine, Ser: 0.89 mg/dL (ref 0.76–1.27)
GFR calc Af Amer: 92 mL/min/{1.73_m2} (ref >59)
GFR calc non Af Amer: 80 mL/min/{1.73_m2} (ref >59)
Globulin, Total: 2.3 g/dL (ref 1.5–4.5)
Glucose: 84 mg/dL (ref 65–99)
Potassium: 4.2 mmol/L (ref 3.5–5.2)
Sodium: 147 mmol/L — ABNORMAL HIGH (ref 134–144)
Total Bilirubin: 1.1 mg/dL (ref 0.0–1.2)
Total Protein: 6.3 g/dL (ref 6.0–8.5)

## 2013-06-21 LAB — CBC WITH DIFFERENTIAL
Basophils Absolute: 0 10*3/uL (ref 0.0–0.2)
Basos: 0 %
Eos: 0 %
Eosinophils Absolute: 0 10*3/uL (ref 0.0–0.4)
HCT: 48 % (ref 37.5–51.0)
Hemoglobin: 15.8 g/dL (ref 12.6–17.7)
Immature Grans (Abs): 0 10*3/uL (ref 0.0–0.1)
Immature Granulocytes: 0 %
Lymphocytes Absolute: 0.9 10*3/uL (ref 0.7–3.1)
Lymphs: 11 %
MCH: 30.5 pg (ref 26.6–33.0)
MCHC: 32.9 g/dL (ref 31.5–35.7)
MCV: 93 fL (ref 79–97)
Monocytes Absolute: 0.6 10*3/uL (ref 0.1–0.9)
Monocytes: 8 %
Neutrophils Absolute: 6.7 10*3/uL (ref 1.4–7.0)
Neutrophils Relative %: 81 %
Platelets: 240 10*3/uL (ref 150–379)
RBC: 5.18 x10E6/uL (ref 4.14–5.80)
RDW: 14.6 % (ref 12.3–15.4)
WBC: 8.3 10*3/uL (ref 3.4–10.8)

## 2013-06-21 LAB — CK: Total CK: 27 U/L (ref 24–204)

## 2013-06-21 NOTE — Telephone Encounter (Signed)
I called the patient. The blood work showed elevation in the SGOT and SGPT. The SGPT is slightly elevated compared to prior studies, SGOT is down. We'll need to follow this closely on the CellCept. I'll recheck the blood again in 2-3 months.

## 2013-06-24 ENCOUNTER — Telehealth (INDEPENDENT_AMBULATORY_CARE_PROVIDER_SITE_OTHER): Payer: Self-pay

## 2013-06-24 NOTE — Telephone Encounter (Signed)
I called the pt and got his wife.  I let her know that Dr Abbey Chatters said it is okay to take that medication.  She is pleased to hear and will let him know.

## 2013-06-24 NOTE — Telephone Encounter (Signed)
From my standpoint, it is okay to take that medication.

## 2013-06-24 NOTE — Telephone Encounter (Signed)
Patient walks in to the office today with questions typed out on a letter for Dr Abbey Chatters.  I do not know the answers so I will forward the message to Dr Abbey Chatters and call the pt back.    He is under current treatment for Myasthenia Gravis.  Dr Anne Hahn prescribed Mycophenolate 500 mg tablets via g- tube bid beginning 12 days after his annual flu shot.  He said the literature cites potential problems with a stomach ulcer or other disorders of the stomach or intestines.  He wants to know if you agree that his recovery is sufficient to undergo treatment with Mycophenolate?  The literature also cites certain antacids as a problem so he has asked Dr Clelia Croft if he should discontinue doses of Ranitidine 15 mg/ ml syrup as was prescribed during the usage of Mycophenolate.  I can call the pt back with Dr Maris Berger recommendations.

## 2013-06-30 ENCOUNTER — Telehealth: Payer: Self-pay | Admitting: Neurology

## 2013-06-30 NOTE — Telephone Encounter (Signed)
I called patient. The patient is doing better already with the medications, prednisone and CellCept. The patient is regaining his ability to swallow, and the saliva issue is not as much of a problem. There may be no need for Botox injections into the parotid glands at this point.

## 2013-07-21 ENCOUNTER — Ambulatory Visit (INDEPENDENT_AMBULATORY_CARE_PROVIDER_SITE_OTHER): Payer: Medicare Other | Admitting: Neurology

## 2013-07-21 ENCOUNTER — Other Ambulatory Visit: Payer: Self-pay

## 2013-07-21 ENCOUNTER — Encounter: Payer: Self-pay | Admitting: Neurology

## 2013-07-21 VITALS — BP 137/90 | HR 98 | Ht 70.0 in | Wt 182.0 lb

## 2013-07-21 DIAGNOSIS — K117 Disturbances of salivary secretion: Secondary | ICD-10-CM

## 2013-07-21 MED ORDER — ONABOTULINUMTOXINA 100 UNITS IJ SOLR: 100.0000 [IU] | Freq: Once | INTRAMUSCULAR | Status: DC

## 2013-07-21 NOTE — Progress Notes (Signed)
[facility]   Provider:  Dr Hosie Poisson Referring Provider: Kari Baars, MD Primary Care Physician:  Kari Baars, MD  CC:  sialorrhea  HPI:  Victor Sutton is a 79 y.o. male here for initial evaluation of sialorrhea associated with myasthenia gravis  Having excessive saliva production which is made worse by difficulty swallowing. Has fluctuated, had some good days and some bad days.   MG has been stable since last visit with Dr Anne Hahn. Having some mild improvement in swallowing but still requires feeding tube. Feels recent medication adjustments have helped.    Physical Exam: Gen: Alert, responsive, NAD CV: RRR Pulm: CTA bilat Neuro: PERRL, EOMI, face grossly symmetric, noted pooling of saliva Motor: moves all extremities spontaneously and against light resistance  Contraindications and precautions discussed with patient.  Aseptic procedure was observed and patient tolerated procedure. Procedure performed by Dr. Elspeth Cho.  These injections are medically necessary. He receives good benefits from these injections. These injections do not cause sedations or hallucinations which the oral therapies may cause.  Indication/Diagnosis:sialorrhea  Type of toxin:Botox   Lot #[id number]  Expiration date: Feb 2017  Injection sites: L parotid 30 R parotid 30     History   Social History  . Marital Status: Married    Spouse Name: N/A    Number of Children: 3  . Years of Education: 16   Occupational History  . Retired   .     Social History Main Topics  . Smoking status: Former Smoker    Quit date: 09/15/1976  . Smokeless tobacco: Never Used     Comment: Quit 1980  . Alcohol Use: Yes     Comment: Moderate alcohol, wine  . Drug Use: No  . Sexual Activity: Not on file   Other Topics Concern  . Not on file   Social History Narrative   Patient is right-handed.          Family History  Problem Relation Age of Onset  . Prostate cancer  Paternal Grandfather   . Breast cancer Sister   . CAD Sister   . COPD Sister   . Heart attack Father     Past Medical History  Diagnosis Date  . BPH (benign prostatic hyperplasia)   . Macular degeneration     (L) wet, (R) dry  . B12 deficiency     HIstory  . 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 Leukoplakia 2001    Treated for  . Hypertension   . Iron deficiency anemia   . Cataract 2009    bilateral cataract surgery  . GERD (gastroesophageal reflux disease)   . Hyperlipidemia     not on medication  . Ulcer   . Bell's palsy     at present  . Shortness of breath   . Peripheral neuropathy     Mild History of  . Hiatal hernia     hiatial hernia repair  02/08/13  . Cancer   . Dysphonia 03/16/2013  . Dysphagia, pharyngoesophageal phase 03/16/2013  . Myasthenia gravis 03/21/2013  . Disturbance of salivary secretion 06/20/2013    Past Surgical History  Procedure Laterality Date  . Cholecystectomy    . Arthroscopic knee surgery      left  . Multiple oral surgeries  2002  . Tonsillectomy    . Cateract surgeries       bilateral  . Stomach surgery  2005    states his stomach had moved up toward his chest ,  some type of surgery performed  . Hernia repair      Laparoscopic ventral  . Hiatal hernia repair  2004  . Skin cancer excision  10/2012    right leg-shin area  . Hiatal hernia repair N/A 02/08/2013    Procedure:  REPAIR OFCURRENT HIATAL HERNIA, ;  Surgeon: Adolph Pollack, MD;  Location: [facility];  Service: General;  Laterality: N/A;  . Upper gi endoscopy N/A 02/08/2013    Procedure: UPPER GI ENDOSCOPY;  Surgeon: Adolph Pollack, MD;  Location: [facility];  Service: General;  Laterality: N/A;  . Laparoscopic lysis of adhesions N/A 02/08/2013    Procedure: LAPAROSCOPIC LYSIS OF ADHESIONS;  Surgeon: Adolph Pollack, MD;  Location: [facility];  Service: General;  Laterality: N/A;  . Laparoscopic nissen fundoplication  02/08/2013    Procedure: LAPAROSCOPIC NISSEN FUNDOPLICATION;   Surgeon: Adolph Pollack, MD;  Location: [facility];  Service: General;;  . Gastrostomy w/ feeding tube  [id number]    Current Outpatient Prescriptions  Medication Sig Dispense Refill  . Banana Flakes (BANATROL) PACK by PEG Tube route 2 times daily at 12 noon and 4 pm.       . mycophenolate (CELLCEPT) 500 MG tablet 500 mg by PEG Tube route 2 (two) times daily.      . Omega-3 Fatty Acids (GNP FISH OIL CONCENTRATE PO) 400 mg by PEG Tube route.       . ondansetron (ZOFRAN) 4 MG tablet 4 mg by PEG Tube route every 8 (eight) hours as needed for nausea.      . predniSONE (DELTASONE) 20 MG tablet 40 mg by PEG Tube route daily.      Marland Kitchen pyridostigmine (MESTINON) 60 MG tablet 30 mg by PEG Tube route 3 (three) times daily.      . ranitidine (ZANTAC) 150 MG/10ML syrup 75 mg by PEG Tube route 2 (two) times daily.      Marland Kitchen nystatin (MYCOSTATIN) 100000 UNIT/ML suspension Take by mouth.        No current facility-administered medications for this visit.    Allergies as of 07/21/2013  . (No Known Allergies)    Vitals: BP 137/90  Pulse 98  Ht 5\' 10"  (1.778 m)  Wt 182 lb (82.555 kg)  BMI 26.11 kg/m2 Last Weight:  Wt Readings from Last 1 Encounters:  07/21/13 182 lb (82.555 kg)   Last Height:   Ht Readings from Last 1 Encounters:  07/21/13 5\' 10"  (1.778 m)      Assessment:  After physical and neurologic examination, review of laboratory studies, imaging, neurophysiology testing and pre-existing records, assessment will be reviewed on the problem list.  Plan:  Treatment plan and additional workup will be reviewed under Problem List.  Victor Sutton is a 79 y.o. male here for botulinum toxin injections. They tolerated procedure well with no complications.   1) Botox injections as detailed above. A total of 30  units was used. 70 wasted 2) Tylenol or Motrin for injection site pain. 3) Medication guide dispensed. 4) Follow up for repeat injections in 3 months

## 2013-07-25 ENCOUNTER — Other Ambulatory Visit (INDEPENDENT_AMBULATORY_CARE_PROVIDER_SITE_OTHER): Payer: Self-pay | Admitting: General Surgery

## 2013-07-25 ENCOUNTER — Encounter (INDEPENDENT_AMBULATORY_CARE_PROVIDER_SITE_OTHER): Payer: Self-pay | Admitting: General Surgery

## 2013-07-25 ENCOUNTER — Telehealth (INDEPENDENT_AMBULATORY_CARE_PROVIDER_SITE_OTHER): Payer: Self-pay | Admitting: *Deleted

## 2013-07-25 ENCOUNTER — Other Ambulatory Visit (INDEPENDENT_AMBULATORY_CARE_PROVIDER_SITE_OTHER): Payer: Self-pay

## 2013-07-25 ENCOUNTER — Ambulatory Visit (INDEPENDENT_AMBULATORY_CARE_PROVIDER_SITE_OTHER): Payer: Medicare Other | Admitting: General Surgery

## 2013-07-25 ENCOUNTER — Other Ambulatory Visit (INDEPENDENT_AMBULATORY_CARE_PROVIDER_SITE_OTHER): Payer: Self-pay | Admitting: *Deleted

## 2013-07-25 VITALS — BP 130/78 | HR 76 | Temp 97.5°F | Resp 14 | Ht 70.0 in | Wt 186.2 lb

## 2013-07-25 DIAGNOSIS — R1314 Dysphagia, pharyngoesophageal phase: Secondary | ICD-10-CM

## 2013-07-25 NOTE — Telephone Encounter (Signed)
I spoke with pts wife to inform her of pts appt for the swallowing study at [facility] on 07/28/13 with an arrival time of 10:45am.

## 2013-07-25 NOTE — Telephone Encounter (Signed)
I called pts wife back to inform her no to go to the previous appt given that was the wrong order.  The new order is for SLP modified barium swallow, I will call pts wife with new appt information as soon as I get it scheduled.

## 2013-07-25 NOTE — Progress Notes (Signed)
Subjective:     Patient ID: Victor Sutton, male   DOB: [birth year], 79 y.o.   MRN: [id number]  HPI  He returns for a followup visit.  He was started on CellCept. His swallowing is better. He can swallow his saliva now. However his energy level is decreased since he started to CellCept.  Review of Systems Diarrhea after tube feedings continues to      Objective:   Physical Exam General-he is comfortable lying flat and is not salivating.  Abdomen soft,G-tube site is clean.    Assessment:     Status post laparoscopic repair of recurrent hiatal hernia. He also has myasthenia gravis. He is on CellCept now. The dysphagia seems to be improving somewhat. Still on tube feedings.    Plan:     We'll check a swallowing study. Continue tube feedings for now. Return visit one month.

## 2013-07-25 NOTE — Patient Instructions (Signed)
We will arrange for you to have an outpatient swallowing study.

## 2013-07-26 ENCOUNTER — Other Ambulatory Visit (HOSPITAL_COMMUNITY): Payer: Self-pay | Admitting: General Surgery

## 2013-07-26 DIAGNOSIS — R1310 Dysphagia, unspecified: Secondary | ICD-10-CM

## 2013-07-26 NOTE — Telephone Encounter (Signed)
I spoke with pts wife, Shirlee Limerick and gave her the correct appt information for pts modified barium swallow which is scheduled for 07/28/13 with an arrival time of 10:45am at [facility].  She is agreeable to this appt.

## 2013-07-28 ENCOUNTER — Ambulatory Visit (HOSPITAL_COMMUNITY)
Admission: RE | Admit: 2013-07-28 | Discharge: 2013-07-28 | Disposition: A | Discharge status: Home / Self Care | Payer: Medicare Other | Source: Ambulatory Visit | Attending: General Surgery | Admitting: General Surgery

## 2013-07-28 ENCOUNTER — Other Ambulatory Visit: Payer: Medicare Other

## 2013-07-28 DIAGNOSIS — R1314 Dysphagia, pharyngoesophageal phase: Secondary | ICD-10-CM

## 2013-07-28 DIAGNOSIS — R1310 Dysphagia, unspecified: Secondary | ICD-10-CM

## 2013-07-28 DIAGNOSIS — Z931 Gastrostomy status: Secondary | ICD-10-CM | POA: Insufficient documentation

## 2013-07-28 DIAGNOSIS — G7000 Myasthenia gravis without (acute) exacerbation: Secondary | ICD-10-CM | POA: Insufficient documentation

## 2013-07-28 NOTE — Procedures (Signed)
Objective Swallowing Evaluation: Modified Barium Swallowing Study  Patient Details  Name: Victor Sutton MRN: [id number] Date of Birth: Mar 07, 1928  Today's Date: 07/28/2013 Time:  -     Past Medical History:  Past Medical History  Diagnosis Date  . BPH (benign prostatic hyperplasia)   . Macular degeneration     (L) wet, (R) dry  . B12 deficiency     HIstory  . 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 Leukoplakia 2001    Treated for  . Hypertension   . Iron deficiency anemia   . Cataract 2009    bilateral cataract surgery  . GERD (gastroesophageal reflux disease)   . Hyperlipidemia     not on medication  . Ulcer   . Bell's palsy     at present  . Shortness of breath   . Peripheral neuropathy     Mild History of  . Hiatal hernia     hiatial hernia repair  02/08/13  . Cancer   . Dysphonia 03/16/2013  . Dysphagia, pharyngoesophageal phase 03/16/2013  . Myasthenia gravis 03/21/2013  . Disturbance of salivary secretion 06/20/2013   Past Surgical History:  Past Surgical History  Procedure Laterality Date  . Cholecystectomy    . Arthroscopic knee surgery      left  . Multiple oral surgeries  2002  . Tonsillectomy    . Cateract surgeries       bilateral  . Stomach surgery  2005    states his stomach had moved up toward his chest , some type of surgery performed  . Hernia repair      Laparoscopic ventral  . Hiatal hernia repair  2004  . Skin cancer excision  10/2012    right leg-shin area  . Hiatal hernia repair N/A 02/08/2013    Procedure:  REPAIR OFCURRENT HIATAL HERNIA, ;  Surgeon: Adolph Pollack, MD;  Location: [facility];  Service: General;  Laterality: N/A;  . Upper gi endoscopy N/A 02/08/2013    Procedure: UPPER GI ENDOSCOPY;  Surgeon: Adolph Pollack, MD;  Location: [facility];  Service: General;  Laterality: N/A;  . Laparoscopic lysis of adhesions N/A 02/08/2013    Procedure: LAPAROSCOPIC LYSIS OF ADHESIONS;  Surgeon: Adolph Pollack, MD;  Location: [facility];  Service:  General;  Laterality: N/A;  . Laparoscopic nissen fundoplication  02/08/2013    Procedure: LAPAROSCOPIC NISSEN FUNDOPLICATION;  Surgeon: Adolph Pollack, MD;  Location: [facility];  Service: General;;  . Gastrostomy w/ feeding tube  [id number]   HPI:  Pt is an 79 year old male with a history of Mysthenia Gravis with resulting dysphagia and dysphonia requiring a PEG tube for 100% of nutritional intake. He has not consumed POs since June per the pt. He complains of excessive saliva production and difficutly swallowing secretions; underwent botox injections to reduce saliva production on 07/21/13 with good result. His MD reports reflux is a global problem with his myasthenia likely causing delayed gastric emptying and some esophageal dysmotility. He currently reports his  ability to swallow has improved with recent medical management.  Prio to dx of MG pt has a history  of a recurrent hiatal hernia and reflux. He was taken to the OR 02/08/13 for an open repair of his hernia with a Nissen fundoplication. Complications of this procedure included aspiration of gastrografin and prolonged intubation leading to PEG tube placement. He underwent MBS on 02/07/13 recommending thin liquids, but pt and wife report they never attempted POs.  Assessment / Plan / Recommendation Clinical Impression  Dysphagia Diagnosis: Mild pharyngeal phase dysphagia;Mild oral phase dysphagia Clinical impression: Pt demosntrates significant improvement sincle last MBS in summer of this year. Pt now with very mild deficits which can be managed with use of careful precautions and strategies. Pt now with mild generalized weakness in hyloaryngeal mechanism leading to slightly decreased airway closure, evident with inconsistent trace penetration or thin liquids before/during, and sometimes after the swallow due to mild residuals. Aspiration only occured with initial very small sips, did not reoccur with large consecutive sips throughout test.  Penetration to cords did occasionally occur, but was reduced with a second swallow to clear residue and occasional cued throat clear. Mastication and transit of solids was functional, no signficant esophageal stasis observed (no radiologist present to confirm). At this point, aspiration risk is minimal, only increasing with fatigue d/t dx. Discussed precautions with pt/family.  there are no barriers for a regular texture diet and thin liquids if pt is ok to consume these from a medical perspective. Pt and wife with a lot of concerns about if eating food could impact his GI system. Asked them to discuss with MD.     Treatment Recommendation  No treatment recommended at this time    Diet Recommendation Regular;Thin liquid   Liquid Administration via: Cup;No straw Medication Administration: Whole meds with puree Supervision: Patient able to self feed Compensations: Slow rate;Multiple dry swallows after each bite/sip;Clear throat intermittently Postural Changes and/or Swallow Maneuvers: Seated upright 90 degrees    Other  Recommendations Recommended Consults:  (clinical dietitian) Oral Care Recommendations: Patient independent with oral care   Follow Up Recommendations  None    Frequency and Duration        Pertinent Vitals/Pain NA    SLP Swallow Goals     General HPI: Pt is an 79 year old male with a history of Mysthenia Gravis with resulting dysphagia and dysphonia requiring a PEG tube for 100% of nutirtional intake. He has not consumed POs since June per the pt. He complains of excessive saliva production and difficutly swallowing secretions; underwent botox injections to reduce saliva production on 07/21/13 with good result. His MD reports reflux is a global problem with his myasthenia likely causing delayed gastric emptying and some esophageal dysmotility. He currently reports his  ability to swallow has improved with recent medical management.  Prio to dx of MG pt has a history  of a  recurrent hiatal hernia and reflux. He was taken to the OR 02/08/13 for an open repair of his hernia with a Nissen fundoplication. Complications of this procedure included aspiration of gastrografin and prolonged intubation leading to PEG tube placement. He underwent MBS on 02/07/13 recommending thin liquids, but pt and wife report they never attempted POs.  Type of Study: Modified Barium Swallowing Study Reason for Referral: Objectively evaluate swallowing function Diet Prior to this Study: NPO;PEG tube Temperature Spikes Noted: N/A Respiratory Status: Room air History of Recent Intubation: No Behavior/Cognition: Alert;Cooperative;Pleasant mood Oral Cavity - Dentition: Adequate natural dentition Oral Motor / Sensory Function: Within functional limits Self-Feeding Abilities: Able to feed self Patient Positioning: Upright in bed Baseline Vocal Quality: Hoarse (mildly hoarse) Volitional Cough: Strong Volitional Swallow: Able to elicit Anatomy: Within functional limits Pharyngeal Secretions: Not observed secondary MBS    Reason for Referral Objectively evaluate swallowing function   Oral Phase Oral Preparation/Oral Phase Oral Phase: Impaired Oral Phase - Comment Oral Phase - Comment: slight pumping on tongue to initiate swallow  Pharyngeal Phase Pharyngeal Phase Pharyngeal Phase: Impaired Pharyngeal - Thin Pharyngeal - Thin Cup: Reduced pharyngeal peristalsis;Reduced epiglottic inversion;Reduced anterior laryngeal mobility;Reduced laryngeal elevation;Reduced airway/laryngeal closure;Reduced tongue base retraction;Penetration/Aspiration after swallow;Penetration/Aspiration before swallow;Pharyngeal residue - valleculae;Pharyngeal residue - pyriform sinuses;Trace aspiration Penetration/Aspiration details (thin cup): Material enters airway, CONTACTS cords and not ejected out;Material does not enter airway Pharyngeal - Thin Straw: Reduced pharyngeal peristalsis;Reduced epiglottic  inversion;Reduced anterior laryngeal mobility;Reduced laryngeal elevation;Reduced airway/laryngeal closure;Reduced tongue base retraction;Penetration/Aspiration before swallow;Pharyngeal residue - valleculae;Pharyngeal residue - pyriform sinuses Penetration/Aspiration details (thin straw): Material does not enter airway Pharyngeal - Solids Pharyngeal - Puree: Reduced pharyngeal peristalsis;Reduced epiglottic inversion;Reduced anterior laryngeal mobility;Reduced laryngeal elevation;Reduced airway/laryngeal closure;Reduced tongue base retraction;Pharyngeal residue - valleculae;Pharyngeal residue - pyriform sinuses Pharyngeal - Regular: Reduced pharyngeal peristalsis;Reduced epiglottic inversion;Reduced anterior laryngeal mobility;Reduced laryngeal elevation;Reduced airway/laryngeal closure;Reduced tongue base retraction;Pharyngeal residue - valleculae;Pharyngeal residue - pyriform sinuses Pharyngeal - Pill: Reduced pharyngeal peristalsis;Reduced epiglottic inversion;Reduced anterior laryngeal mobility;Reduced laryngeal elevation;Reduced airway/laryngeal closure;Reduced tongue base retraction;Penetration/Aspiration after swallow;Penetration/Aspiration before swallow;Pharyngeal residue - valleculae;Pharyngeal residue - pyriform sinuses;Trace aspiration Penetration/Aspiration details (pill): Material enters airway, CONTACTS cords and not ejected out  Cervical Esophageal Phase    GO    Cervical Esophageal Phase Cervical Esophageal Phase:  (trace residuals in cervical esopahgus)    Functional Assessment Tool Used: clniical judgement Functional Limitations: Swallowing Swallow Current Status (W0981): At least 1 percent but less than 20 percent impaired, limited or restricted Swallow Goal Status [phone]): At least 1 percent but less than 20 percent impaired, limited or restricted Swallow Discharge Status [phone]): At least 1 percent but less than 20 percent impaired, limited or restricted   [facility], [place] CCC-SLP [phone]   Victor Sutton 07/28/2013, 2:17 PM

## 2013-07-29 ENCOUNTER — Telehealth: Payer: Self-pay | Admitting: Neurology

## 2013-07-29 ENCOUNTER — Encounter (INDEPENDENT_AMBULATORY_CARE_PROVIDER_SITE_OTHER): Payer: Self-pay | Admitting: General Surgery

## 2013-07-29 DIAGNOSIS — G7000 Myasthenia gravis without (acute) exacerbation: Secondary | ICD-10-CM

## 2013-07-29 DIAGNOSIS — R1310 Dysphagia, unspecified: Secondary | ICD-10-CM

## 2013-07-29 NOTE — Telephone Encounter (Signed)
The patient had a modified barium swallow, indicating that the patient can take thin liquids and a regular diet. I would prefer that a FEES study be done instead, as this will take up on any fatigability in regard to the patient. I will reorder this study.

## 2013-07-29 NOTE — Progress Notes (Signed)
Patient ID: Victor Sutton, male   DOB: 08-20-1928, 79 y.o.   MRN: [id number] I discussed the results of a modified barium swallow test with him and with Dr. Anne Hahn. The results are below:  Clinical Impression  Dysphagia Diagnosis: Mild pharyngeal phase dysphagia;Mild oral phase dysphagia  Clinical impression: Pt demosntrates significant improvement sincle last MBS in summer of this year. Pt now with very mild deficits which can be managed with use of careful precautions and strategies. Pt now with mild generalized weakness in hyloaryngeal mechanism leading to slightly decreased airway closure, evident with inconsistent trace penetration or thin liquids before/during, and sometimes after the swallow due to mild residuals. Aspiration only occured with initial very small sips, did not reoccur with large consecutive sips throughout test. Penetration to cords did occasionally occur, but was reduced with a second swallow to clear residue and occasional cued throat clear. Mastication and transit of solids was functional, no signficant esophageal stasis observed (no radiologist present to confirm). At this point, aspiration risk is minimal, only increasing with fatigue d/t dx. Discussed precautions with pt/family. there are no barriers for a regular texture diet and thin liquids if pt is ok to consume these from a medical perspective. Pt and wife with a lot of concerns about if eating food could impact his GI system. Asked them to discuss with MD.  Treatment Recommendation  No treatment recommended at this time  Diet Recommendation Regular;Thin liquid   Dr. Anne Hahn once again one more test, a FEES study, to see how he is swallowing does over time.  [name] understands this. Dr. Anne Hahn stated that he will arrange for this test.

## 2013-08-04 ENCOUNTER — Ambulatory Visit (HOSPITAL_COMMUNITY)
Admission: RE | Admit: 2013-08-04 | Discharge: 2013-08-04 | Disposition: A | Discharge status: Home / Self Care | Payer: Medicare Other | Source: Ambulatory Visit | Attending: Neurology | Admitting: Neurology

## 2013-08-04 ENCOUNTER — Encounter (HOSPITAL_COMMUNITY)
Admission: RE | Admit: 2013-08-04 | Discharge: 2013-08-04 | Disposition: A | Discharge status: Home / Self Care | Payer: Medicare Other | Source: Ambulatory Visit | Attending: Neurology | Admitting: Neurology

## 2013-08-04 DIAGNOSIS — R1313 Dysphagia, pharyngeal phase: Secondary | ICD-10-CM | POA: Insufficient documentation

## 2013-08-04 DIAGNOSIS — R1311 Dysphagia, oral phase: Secondary | ICD-10-CM | POA: Insufficient documentation

## 2013-08-04 NOTE — Procedures (Signed)
Objective Swallowing Evaluation: Fiberoptic Endoscopic Evaluation of Swallowing  Patient Details  Name: Victor Sutton MRN: [id number] Date of Birth: July 09, 1928  Today's Date: 08/04/2013 Time: 1137-1230 SLP Time Calculation (min): 53 min  Past Medical History:  Past Medical History  Diagnosis Date  . BPH (benign prostatic hyperplasia)   . Macular degeneration     (L) wet, (R) dry  . B12 deficiency     HIstory  . 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 Leukoplakia 2001    Treated for  . Hypertension   . Iron deficiency anemia   . Cataract 2009    bilateral cataract surgery  . GERD (gastroesophageal reflux disease)   . Hyperlipidemia     not on medication  . Ulcer   . Bell's palsy     at present  . Shortness of breath   . Peripheral neuropathy     Mild History of  . Hiatal hernia     hiatial hernia repair  02/08/13  . Cancer   . Dysphonia 03/16/2013  . Dysphagia, pharyngoesophageal phase 03/16/2013  . Myasthenia gravis 03/21/2013  . Disturbance of salivary secretion 06/20/2013   Past Surgical History:  Past Surgical History  Procedure Laterality Date  . Cholecystectomy    . Arthroscopic knee surgery      left  . Multiple oral surgeries  2002  . Tonsillectomy    . Cateract surgeries       bilateral  . Stomach surgery  2005    states his stomach had moved up toward his chest , some type of surgery performed  . Hernia repair      Laparoscopic ventral  . Hiatal hernia repair  2004  . Skin cancer excision  10/2012    right leg-shin area  . Hiatal hernia repair N/A 02/08/2013    Procedure:  REPAIR OFCURRENT HIATAL HERNIA, ;  Surgeon: Adolph Pollack, MD;  Location: [facility];  Service: General;  Laterality: N/A;  . Upper gi endoscopy N/A 02/08/2013    Procedure: UPPER GI ENDOSCOPY;  Surgeon: Adolph Pollack, MD;  Location: [facility];  Service: General;  Laterality: N/A;  . Laparoscopic lysis of adhesions N/A 02/08/2013    Procedure: LAPAROSCOPIC LYSIS OF ADHESIONS;  Surgeon: Adolph Pollack, MD;  Location: [facility];  Service: General;  Laterality: N/A;  . Laparoscopic nissen fundoplication  02/08/2013    Procedure: LAPAROSCOPIC NISSEN FUNDOPLICATION;  Surgeon: Adolph Pollack, MD;  Location: [facility];  Service: General;;  . Gastrostomy w/ feeding tube  [id number]   HPI:  Pt is an 79 year old male with a history of Mysthenia Gravis with resulting dysphagia and dysphonia requiring a PEG tube for 100% of nutritional intake. He has not consumed POs since June per the pt. He had previous complaints of excessive saliva production and difficutly swallowing secretions; underwent botox injections to reduce saliva production on 07/21/13 with good result. His MD reports reflux is a global problem with his myasthenia likely causing delayed gastric emptying and some esophageal dysmotility. He currently reports his ability to swallow has improved with recent medical management. MBS on both 02/07/13 and 07/28/13 recommended initiation of a po diet with thin liquids however patient and wife report they have not attempted pos under physician instruction. Prior to dx of MG pt has a history of a recurrent hiatal hernia and reflux. He was taken to the OR 02/08/13 for an open repair of his hernia with a Nissen fundoplication. Complications of this procedure included  aspiration of gastrografin and prolonged intubation leading to PEG tube placement. Current FEES ordered to assess swallowing function in the setting of fatigue to ensure swallowing safety for initiation of a po diet     Assessment / Plan / Recommendation Clinical Impression  Dysphagia Diagnosis: Mild oral phase dysphagia;Mild pharyngeal phase dysphagia Clinical impression: Pt demonstrates similar function to most recent MBS with some improvements overall. Mild generalized weakness in hyloaryngeal mechanism results in trace-mild residuals in the vallecula and pyriform sinuses which are cleared fully with spontaneous dry swallows. Left head turn  provided some assistance in decreasing these residuals as well as liquid wash assisted to fully clear. Note that with fatigue, ability to initiate dry swallows decreased however pharyngeal function remained consistent. Full airway protection noted during today's study.   Mastication and transit of solids is functional.  No esophageal backflow noted however h/o GI/esophageal deficits certainly increases risk. At this point, aspiration risk remains minimal, increasing again with fatigue however this SLP does not feel that this should limit his ability to initiate a po diet. Mild deficits can be managed with careful use of precautions and strategies. Discussed in depth with patient and wife who reported delight over outcome of exam but continued anxiety as well.  Currently recommend a regular diet, thin lquids however provided patient information on a dysphagia 3 (mechanical soft) diet as well as this may be a more comfortable starting point for patient and family. Patient plans to discuss with MD.     Treatment Recommendation  No treatment recommended at this time    Diet Recommendation Regular;Thin liquid   Liquid Administration via: Cup;Straw Medication Administration: Whole meds with puree Supervision: Patient able to self feed Compensations: Slow rate;Multiple dry swallows after each bite/sip;Follow solids with liquid (follow solids with liquids intermittently) Postural Changes and/or Swallow Maneuvers: Seated upright 90 degrees    Other  Recommendations Oral Care Recommendations: Patient independent with oral care   Follow Up Recommendations  None               General HPI: Pt is an 79 year old male with a history of Mysthenia Gravis with resulting dysphagia and dysphonia requiring a PEG tube for 100% of nutritional intake. He has not consumed POs since June per the pt. He had previous complaints of excessive saliva production and difficutly swallowing secretions; underwent botox injections  to reduce saliva production on 07/21/13 with good result. His MD reports reflux is a global problem with his myasthenia likely causing delayed gastric emptying and some esophageal dysmotility. He currently reports his ability to swallow has improved with recent medical management. MBS on both 02/07/13 and 07/28/13 recommended initiation of a po diet with thin liquids however patient and wife report they have not attempted pos under physician instruction. Prior to dx of MG pt has a history of a recurrent hiatal hernia and reflux. He was taken to the OR 02/08/13 for an open repair of his hernia with a Nissen fundoplication. Complications of this procedure included aspiration of gastrografin and prolonged intubation leading to PEG tube placement. Current FEES ordered to assess swallowing function in the setting of fatigue to ensure swallowing safety for initiation of a po diet Type of Study: Fiberoptic Endoscopic Evaluation of Swallowing Reason for Referral: Objectively evaluate swallowing function Previous Swallow Assessment: see HPI Diet Prior to this Study: NPO;PEG tube Temperature Spikes Noted: No Respiratory Status: Room air History of Recent Intubation: No Behavior/Cognition: Alert;Cooperative;Pleasant mood Oral Cavity - Dentition: Adequate natural dentition Oral  Motor / Sensory Function: Within functional limits Self-Feeding Abilities: Able to feed self Patient Positioning: Upright in chair Baseline Vocal Quality:  (mildly hoarse although seems to be improving) Volitional Cough: Strong Volitional Swallow: Able to elicit Anatomy:  (? mildly decreased adduction of vocal cords; ENT not present)    Reason for Referral Objectively evaluate swallowing function   Oral Phase Oral Preparation/Oral Phase Oral Phase: WFL   Pharyngeal Phase Pharyngeal Phase Pharyngeal Phase: Impaired Pharyngeal - Thin Pharyngeal - Ice Chips: Reduced pharyngeal peristalsis;Reduced epiglottic inversion;Reduced anterior  laryngeal mobility;Reduced laryngeal elevation;Reduced airway/laryngeal closure;Reduced tongue base retraction;Pharyngeal residue - pyriform sinuses Pharyngeal - Thin Cup: Reduced pharyngeal peristalsis;Reduced epiglottic inversion;Reduced anterior laryngeal mobility;Reduced laryngeal elevation;Reduced airway/laryngeal closure;Reduced tongue base retraction;Pharyngeal residue - valleculae;Pharyngeal residue - pyriform sinuses Penetration/Aspiration details (thin cup): Material does not enter airway Pharyngeal - Thin Straw: Reduced pharyngeal peristalsis;Reduced epiglottic inversion;Reduced anterior laryngeal mobility;Reduced laryngeal elevation;Reduced airway/laryngeal closure;Reduced tongue base retraction;Pharyngeal residue - pyriform sinuses Penetration/Aspiration details (thin straw): Material does not enter airway Pharyngeal - Solids Pharyngeal - Puree: Reduced pharyngeal peristalsis;Reduced epiglottic inversion;Reduced anterior laryngeal mobility;Reduced laryngeal elevation;Reduced airway/laryngeal closure;Reduced tongue base retraction;Pharyngeal residue - valleculae;Pharyngeal residue - posterior pharnyx Pharyngeal - Regular: Reduced pharyngeal peristalsis;Reduced epiglottic inversion;Reduced anterior laryngeal mobility;Reduced laryngeal elevation;Reduced airway/laryngeal closure;Reduced tongue base retraction;Pharyngeal residue - valleculae Pharyngeal - Pill: Not tested  Cervical Esophageal Phase    GO    Cervical Esophageal Phase Cervical Esophageal Phase: [facility]    Functional Assessment Tool Used: skilled clinical judgement Functional Limitations: Swallowing Swallow Current Status (Z6109): At least 1 percent but less than 20 percent impaired, limited or restricted Swallow Goal Status [phone]): At least 1 percent but less than 20 percent impaired, limited or restricted Swallow Discharge Status [phone]): At least 1 percent but less than 20 percent impaired, limited or restricted   [facility] MA, CCC-SLP [phone]  Victor Sutton Meryl 08/04/2013, 3:16 PM

## 2013-08-14 ENCOUNTER — Telehealth: Payer: Self-pay

## 2013-08-14 NOTE — Telephone Encounter (Signed)
[employer] sent [place] a letter stating they have approved our request for coverage on Mycophenolate effective until 07/31/2014.

## 2013-08-15 ENCOUNTER — Telehealth: Payer: Self-pay | Admitting: Neurology

## 2013-08-15 NOTE — Telephone Encounter (Signed)
I called the patient, talked with the wife. The speech therapist recommended a regular diet with thin liquids. It may be reasonable to start with a pured type diet to ensure that the patient has not fatigue but eating a meal. The patient is having some fatigue with the legs. Overall, the patient is gradually improving. The patient is now able to handle his own saliva.

## 2013-08-22 ENCOUNTER — Telehealth: Payer: Self-pay | Admitting: Neurology

## 2013-08-22 DIAGNOSIS — Z5181 Encounter for therapeutic drug level monitoring: Secondary | ICD-10-CM

## 2013-08-22 NOTE — Telephone Encounter (Signed)
I called the patient. The patient has come in to have a CBC and a comprehensive metabolic profile done.

## 2013-08-24 ENCOUNTER — Telehealth: Payer: Self-pay | Admitting: Neurology

## 2013-08-24 ENCOUNTER — Encounter (INDEPENDENT_AMBULATORY_CARE_PROVIDER_SITE_OTHER): Payer: Self-pay | Admitting: General Surgery

## 2013-08-24 ENCOUNTER — Other Ambulatory Visit (INDEPENDENT_AMBULATORY_CARE_PROVIDER_SITE_OTHER): Payer: Self-pay

## 2013-08-24 ENCOUNTER — Ambulatory Visit (INDEPENDENT_AMBULATORY_CARE_PROVIDER_SITE_OTHER): Payer: Medicare Other | Admitting: General Surgery

## 2013-08-24 VITALS — BP 128/76 | HR 92 | Temp 98.1°F | Resp 15 | Ht 70.0 in | Wt 194.8 lb

## 2013-08-24 DIAGNOSIS — Z5181 Encounter for therapeutic drug level monitoring: Secondary | ICD-10-CM

## 2013-08-24 DIAGNOSIS — Z0289 Encounter for other administrative examinations: Secondary | ICD-10-CM

## 2013-08-24 DIAGNOSIS — R1314 Dysphagia, pharyngoesophageal phase: Secondary | ICD-10-CM

## 2013-08-24 LAB — COMPREHENSIVE METABOLIC PANEL
ALT: 48 IU/L — ABNORMAL HIGH (ref 0–44)
AST: 28 IU/L (ref 0–40)
Albumin/Globulin Ratio: 1.5 (ref 1.1–2.5)
Albumin: 3.5 g/dL (ref 3.5–4.7)
Alkaline Phosphatase: 60 IU/L (ref 39–117)
BUN/Creatinine Ratio: 19 (ref 10–22)
BUN: 14 mg/dL (ref 8–27)
CO2: 29 mmol/L (ref 18–29)
Calcium: 9 mg/dL (ref 8.6–10.2)
Chloride: 106 mmol/L (ref 96–108)
Creatinine, Ser: 0.72 mg/dL — ABNORMAL LOW (ref 0.76–1.27)
GFR calc Af Amer: 100 mL/min/{1.73_m2} (ref >59)
GFR calc non Af Amer: 86 mL/min/{1.73_m2} (ref >59)
Globulin, Total: 2.4 g/dL (ref 1.5–4.5)
Glucose: 90 mg/dL (ref 65–99)
Potassium: 4.1 mmol/L (ref 3.5–5.2)
Sodium: 141 mmol/L (ref 134–144)
Total Bilirubin: 1 mg/dL (ref 0.0–1.2)
Total Protein: 5.9 g/dL — ABNORMAL LOW (ref 6.0–8.5)

## 2013-08-24 LAB — CBC WITH DIFFERENTIAL
Basophils Absolute: 0 10*3/uL (ref 0.0–0.2)
Basos: 0 %
Eos: 0 %
Eosinophils Absolute: 0 10*3/uL (ref 0.0–0.4)
HCT: 44 % (ref 37.5–51.0)
Hemoglobin: 15.3 g/dL (ref 12.6–17.7)
Lymphocytes Absolute: 0.6 10*3/uL — ABNORMAL LOW (ref 0.7–3.1)
Lymphs: 6 %
MCH: 32.9 pg (ref 26.6–33.0)
MCHC: 34.8 g/dL (ref 31.5–35.7)
MCV: 95 fL (ref 79–97)
Monocytes Absolute: 0.7 10*3/uL (ref 0.1–0.9)
Monocytes: 7 %
Neutrophils Absolute: 8.5 10*3/uL — ABNORMAL HIGH (ref 1.4–7.0)
Neutrophils Relative %: 87 %
Platelets: 214 10*3/uL (ref 150–379)
RBC: 4.65 x10E6/uL (ref 4.14–5.80)
RDW: 16.3 % — ABNORMAL HIGH (ref 12.3–15.4)
WBC: 9.8 10*3/uL (ref 3.4–10.8)

## 2013-08-24 MED ORDER — HYDROCHLOROTHIAZIDE 25 MG PO TABS: 25.0000 mg | ORAL_TABLET | Freq: Every day | ORAL | Status: DC

## 2013-08-24 MED ORDER — MYCOPHENOLATE MOFETIL 250 MG PO CAPS: 500.0000 mg | ORAL_CAPSULE | Freq: Two times a day (BID) | ORAL | Status: DC

## 2013-08-24 NOTE — Telephone Encounter (Signed)
I called patient. The blood work was relatively unremarkable, liver enzymes have markedly improved. The patient is having difficulty with the CellCept 500 mg tablets, difficult to swallow. I will change into the 250 mg tablets taking 2 twice daily. This also comes in liquid form if the patient needed.  The patient is also having a lot of swelling the legs, I will add hydrochlorothiazide to the regimen.

## 2013-08-24 NOTE — Patient Instructions (Signed)
I would suggest a can of boost or Ensure in the morning and one in the afternoon.

## 2013-08-24 NOTE — Progress Notes (Signed)
Subjective:     Patient ID: Victor Sutton, male   DOB: 1928/02/18, 79 y.o.   MRN: [id number]  HPI  He returns for a followup visit.  He was started on CellCept. His swallowing continues to improve.  He is on a pured-type diet. He has not used his tube for tube feedings in about 4-5 days. He seems to be tolerating this diet from a swallowing standpoint. Review of Systems He has developed increasing lower extremity edema. He's going to be seeing Dr. Clelia Croft tomorrow.      Objective:   Physical Exam General-He looks well and is in no acute distress. No excessive salivation noted.  Abdomen soft,G-tube site is clean.    Assessment:     Status post laparoscopic repair of recurrent hiatal hernia. He also has myasthenia gravis. He continues to slowly improve and his swelling has improved enough for him to be put on a pured-type diet. I'm not sure his caloric intake is adequate with this however.   Plan:     Continue current diet. Have one can of Ensure or Boost in the morning and one in the afternoon. Return visit 2 months. From my standpoint, gastrostomy tube can be removed when he can maintain appropriate nutrition orally.

## 2013-09-30 ENCOUNTER — Encounter: Payer: Self-pay | Admitting: Neurology

## 2013-09-30 ENCOUNTER — Ambulatory Visit (INDEPENDENT_AMBULATORY_CARE_PROVIDER_SITE_OTHER): Payer: Medicare Other | Admitting: Neurology

## 2013-09-30 VITALS — BP 157/83 | HR 64 | Wt 202.0 lb

## 2013-09-30 DIAGNOSIS — Z5181 Encounter for therapeutic drug level monitoring: Secondary | ICD-10-CM

## 2013-09-30 DIAGNOSIS — G7000 Myasthenia gravis without (acute) exacerbation: Secondary | ICD-10-CM

## 2013-09-30 MED ORDER — PREDNISONE 10 MG PO TABS: 30.0000 mg | ORAL_TABLET | Freq: Every day | ORAL | Status: DC

## 2013-09-30 NOTE — Patient Instructions (Signed)
Myasthenia Gravis Myasthenia gravis is a disease that causes muscle weakness throughout the body. The muscles affected are the ones we can control (voluntary muscles). An example of a voluntary muscle is your hand muscles. You can control the muscles to make the hand pick something up. An example of an involuntary muscle is the heart. The heart beats without any direction from you.  Myasthenia Gravis is thought to be an autoimmune disease. That means that normal defenses of the body begin to attack the body. In this case, the immune system begins to attack cells located at the junctions of the muscles and the nerves. Women are affected more often. Women are affected at a younger age than men. Babies born to affected women frequently develop symptoms at an early age. SYMPTOMS Initially in the disease, the facial muscles are affected first. After this, a person may develop droopy eyelids. They may have difficulty controlling facial muscles. They may have problems chewing. Swallowing and speaking may become impaired. The weakness gradually spreads to the arms and legs. It begins to affect breathing. Sometimes, the symptoms lessen or go away without any apparent cause. DIAGNOSIS  Diagnosis can be made with blood tests. Tests such as electromyography may be done to examine the electrical activity in the muscle. An improvement in symptoms after having an anti-cholinesterase drug helps confirm the diagnosis.  TREATMENT  Medicines are usually prescribed as the first treatment. These medicines help, but they do not cure the disease. A plasma cleansing procedure (plasmapheresis) can be used to treat a crisis. It can also be used to prepare a person for surgery. This procedure produces short-term improvement. Some cases are helped by removing the thymus gland. Steroids are used for short-term benefits. Document Released: 12/08/2000 Document Revised: 11/24/2011 Document Reviewed: 09/01/2005 [employer] Patient  Information 2014 [employer].  

## 2013-09-30 NOTE — Progress Notes (Signed)
Reason for visit: Myasthenia gravis  Victor Sutton is an 80 y.o. male  History of present illness:  Victor Sutton is an 80 year old right-handed white male with a history of myasthenia gravis with pharyngeal features. Victor Sutton has a feeding tube in place still, but his ability to swallow has essentially normalized. Victor Sutton denies any problems with liquids or solids with swallowing. Victor wife has occasionally noted some ptosis of Victor left eye in Victor evening hours. Victor Sutton denies any double vision. Within Victor last several weeks, Victor Sutton has developed some proximal weakness of Victor legs, with difficulty standing up from a seated position. Victor Sutton is having some swelling in Victor legs from Victor prednisone, but otherwise he is tolerating Victor prednisone and CellCept well. Victor Sutton had elevated liver enzymes previously, and this has improved with Victor last study, but this issue needs to be followed.  Past Medical History  Diagnosis Date  . BPH (benign prostatic hyperplasia)   . Macular degeneration     (L) wet, (R) dry  . B12 deficiency     HIstory  . 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 Leukoplakia 2001    Treated for  . Hypertension   . Iron deficiency anemia   . Cataract 2009    bilateral cataract surgery  . GERD (gastroesophageal reflux disease)   . Hyperlipidemia     not on medication  . Ulcer   . Bell's palsy     at present  . Shortness of breath   . Peripheral neuropathy     Mild History of  . Hiatal hernia     hiatial hernia repair  02/08/13  . Cancer   . Dysphonia 03/16/2013  . Dysphagia, pharyngoesophageal phase 03/16/2013  . Myasthenia gravis 03/21/2013  . Disturbance of salivary secretion 06/20/2013    Past Surgical History  Procedure Laterality Date  . Cholecystectomy    . Arthroscopic knee surgery      left  . Multiple oral surgeries  2002  . Tonsillectomy    . Cateract surgeries       bilateral  . Stomach surgery  2005    states his stomach had  moved up toward his chest , some type of surgery performed  . Hernia repair      Laparoscopic ventral  . Hiatal hernia repair  2004  . Skin cancer excision  10/2012    right leg-shin area  . Hiatal hernia repair N/A 02/08/2013    Procedure:  REPAIR OFCURRENT HIATAL HERNIA, ;  Surgeon: Odis Hollingshead, MD;  Location: [facility];  Service: General;  Laterality: N/A;  . Upper gi endoscopy N/A 02/08/2013    Procedure: UPPER GI ENDOSCOPY;  Surgeon: Odis Hollingshead, MD;  Location: [facility];  Service: General;  Laterality: N/A;  . Laparoscopic lysis of adhesions N/A 02/08/2013    Procedure: LAPAROSCOPIC LYSIS OF ADHESIONS;  Surgeon: Odis Hollingshead, MD;  Location: [facility];  Service: General;  Laterality: N/A;  . Laparoscopic nissen fundoplication  5/46/2703    Procedure: LAPAROSCOPIC NISSEN FUNDOPLICATION;  Surgeon: Odis Hollingshead, MD;  Location: [facility];  Service: General;;  . Gastrostomy w/ feeding tube  [id number]    Family History  Problem Relation Age of Onset  . Prostate cancer Paternal Grandfather   . Breast cancer Sister   . CAD Sister   . COPD Sister   . Heart attack Father     Social history:  reports that he quit smoking  about 37 years ago. He has never used smokeless tobacco. He reports that he drinks alcohol. He reports that he does not use illicit drugs.   No Known Allergies  Medications:  Current Outpatient Prescriptions on File Prior to Visit  Medication Sig Dispense Refill  . hydrochlorothiazide (HYDRODIURIL) 25 MG tablet Take 1 tablet (25 mg total) by mouth daily.  30 tablet  3  . mycophenolate (CELLCEPT) 250 MG capsule Take 2 capsules (500 mg total) by mouth 2 (two) times daily.  120 capsule  5  . nystatin (MYCOSTATIN) 100000 UNIT/ML suspension Take 100,000 Units by mouth.        Current Facility-Administered Medications on File Prior to Visit  Medication Dose Route Frequency Provider Last Rate Last Dose  . botulinum toxin Type A (BOTOX) injection 100 Units  100 Units  Intramuscular Once Hulen Luster, DO        ROS:  Out of a complete 14 system review of symptoms, Victor Sutton complains only of Victor following symptoms, and all other reviewed systems are negative.  Appetite change Facial swelling Leg swelling Excessive eating Frequent waking, snoring Joint swelling, walking difficulty Bruising easily Weakness of Victor legs  Blood pressure 157/83, pulse 64, weight 202 lb (91.627 kg).  Physical Exam  General: Victor Sutton is alert and cooperative at Victor time of Victor examination.  Skin: 2+ edema below Victor knees is seen bilaterally.   Neurologic Exam  Mental status: Victor Sutton is oriented x 3.  Cranial nerves: Facial symmetry is not present. Very mild left ptosis is seen. Speech is normal, no aphasia or dysarthria is noted. Extraocular movements are full. Visual fields are full. Pupils are equal, round, and reactive to light. Discs are flat bilaterally. With superior gaze for 1 minute, Victor Sutton has no increased ptosis, or reports of double vision.  Motor: Victor Sutton has good strength in all 4 extremities, with exception that there is trace proximal muscle weakness of Victor legs bilaterally. With Victor arms outstretched 1 minute, no fatigable weakness of Victor deltoid muscles is seen. With Victor arms crossed, Victor Sutton is unable to stand.  Sensory examination: Soft touch sensation on Victor face, arms, and legs is symmetric.  Coordination: Victor Sutton has good finger-nose-finger and heel-to-shin bilaterally.  Gait and station: Victor Sutton has a normal gait. Tandem gait is unsteady. Romberg is negative. No drift is seen.  Reflexes: Deep tendon reflexes are symmetric, but are depressed.   Assessment/Plan:  One. Myasthenia gravis, with pharyngeal features  2. 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 Victor Sutton denies any double vision. Victor  Sutton has developed recent onset of some problems with proximal leg weakness, and this likely represents a low-grade steroid myopathy. Victor Sutton will be set up for blood work today, and Victor prednisone will be reduced to 30 mg daily. This will need to be tapered gradually over Victor next several months. Victor Sutton followup in Victor next 3-4 months. Victor Sutton will need to have his feeding tube removed.   Jill Alexanders MD 09/30/2013 7:23 PM  [facility] [address] [place], [place] [address]  Phone [phone] Fax [phone]

## 2013-10-01 LAB — CBC WITH DIFFERENTIAL
Basophils Absolute: 0 10*3/uL (ref 0.0–0.2)
Basos: 0 %
Eos: 0 %
Eosinophils Absolute: 0 10*3/uL (ref 0.0–0.4)
HCT: 45.6 % (ref 37.5–51.0)
Hemoglobin: 15.3 g/dL (ref 12.6–17.7)
Immature Grans (Abs): 0.1 10*3/uL (ref 0.0–0.1)
Immature Granulocytes: 2 %
Lymphocytes Absolute: 0.5 10*3/uL — ABNORMAL LOW (ref 0.7–3.1)
Lymphs: 6 %
MCH: 33.6 pg — ABNORMAL HIGH (ref 26.6–33.0)
MCHC: 33.6 g/dL (ref 31.5–35.7)
MCV: 100 fL — ABNORMAL HIGH (ref 79–97)
Monocytes Absolute: 0.6 10*3/uL (ref 0.1–0.9)
Monocytes: 7 %
Neutrophils Absolute: 6.7 10*3/uL (ref 1.4–7.0)
Neutrophils Relative %: 85 %
Platelets: 259 10*3/uL (ref 150–379)
RBC: 4.56 x10E6/uL (ref 4.14–5.80)
RDW: 14.2 % (ref 12.3–15.4)
WBC: 7.9 10*3/uL (ref 3.4–10.8)

## 2013-10-01 LAB — COMPREHENSIVE METABOLIC PANEL
ALT: 34 IU/L (ref 0–44)
AST: 23 IU/L (ref 0–40)
Albumin/Globulin Ratio: 2 (ref 1.1–2.5)
Albumin: 3.8 g/dL (ref 3.5–4.7)
Alkaline Phosphatase: 52 IU/L (ref 39–117)
BUN/Creatinine Ratio: 25 — ABNORMAL HIGH (ref 10–22)
BUN: 20 mg/dL (ref 8–27)
CO2: 26 mmol/L (ref 18–29)
Calcium: 8.8 mg/dL (ref 8.6–10.2)
Chloride: 103 mmol/L (ref 97–108)
Creatinine, Ser: 0.8 mg/dL (ref 0.76–1.27)
GFR calc Af Amer: 95 mL/min/{1.73_m2} (ref >59)
GFR calc non Af Amer: 83 mL/min/{1.73_m2} (ref >59)
Globulin, Total: 1.9 g/dL (ref 1.5–4.5)
Glucose: 105 mg/dL — ABNORMAL HIGH (ref 65–99)
Potassium: 4.3 mmol/L (ref 3.5–5.2)
Sodium: 142 mmol/L (ref 134–144)
Total Bilirubin: 0.9 mg/dL (ref 0.0–1.2)
Total Protein: 5.7 g/dL — ABNORMAL LOW (ref 6.0–8.5)

## 2013-10-01 LAB — CK: Total CK: 32 U/L (ref 24–204)

## 2013-10-10 ENCOUNTER — Encounter (INDEPENDENT_AMBULATORY_CARE_PROVIDER_SITE_OTHER): Payer: Self-pay | Admitting: General Surgery

## 2013-10-10 ENCOUNTER — Ambulatory Visit (INDEPENDENT_AMBULATORY_CARE_PROVIDER_SITE_OTHER): Payer: Medicare Other | Admitting: General Surgery

## 2013-10-10 VITALS — BP 152/100 | HR 88 | Temp 97.4°F | Resp 15 | Ht 70.0 in | Wt 198.2 lb

## 2013-10-10 DIAGNOSIS — Z931 Gastrostomy status: Secondary | ICD-10-CM

## 2013-10-10 NOTE — Patient Instructions (Signed)
Change the dressing daily or more often if needed.

## 2013-10-10 NOTE — Progress Notes (Signed)
Subjective:     Patient ID: Victor Sutton, male   DOB: 26-May-1928, 80 y.o.   MRN: [id number]  HPI   Victor Sutton is doing much better overall. He is tolerating a regular diet and swallowing without difficulty. His prednisone is being weaned. He no longer needs the gastrostomy tube.   Review of Systems  No dysphagia or coughing after eating.  Has some mild heartburn at times.     Objective:   Physical Exam Gen.-he looks well and is in no acute distress.  Abdomen-gastrostomy tube in left upper quadrant which was removed. Some granulation tissue is treated with silver nitrate. A dry dressing was applied.    Assessment:     Status post laparoscopic repair of recurrent hiatal hernia and Nissen fundoplication.  He also had myasthenia gravis. He is improving over all.    Plan:     Wound care instructions were given to him regarding the gastrostomy site. Return as needed.

## 2013-10-12 ENCOUNTER — Encounter (INDEPENDENT_AMBULATORY_CARE_PROVIDER_SITE_OTHER): Payer: Self-pay

## 2013-10-13 ENCOUNTER — Encounter (INDEPENDENT_AMBULATORY_CARE_PROVIDER_SITE_OTHER): Payer: Self-pay

## 2013-10-27 ENCOUNTER — Ambulatory Visit: Payer: Medicare Other | Admitting: Neurology

## 2013-11-03 ENCOUNTER — Encounter (INDEPENDENT_AMBULATORY_CARE_PROVIDER_SITE_OTHER): Payer: Medicare Other | Admitting: General Surgery

## 2013-12-02 ENCOUNTER — Telehealth: Payer: Self-pay | Admitting: Neurology

## 2013-12-02 NOTE — Telephone Encounter (Signed)
Called pt and spoke with pt's wife wanting to know if Dr. Jannifer Franklin wanted the pt to get blood drawn at Dr. Raul Del office, PCP, while at his appt on 12/09/13. Pt's wife also states that the pt is still having swelling in legs and face as well. Pt's last OV was 1/15 and was suppose to come back for 4 mo f/u and could not get in until 03/22/14. Pt's wife wanted to know if pt needed to come in sooner than that. Please advise

## 2013-12-02 NOTE — Telephone Encounter (Signed)
Pt's wife [name] called and states that pt is seeing PCP next Tuesday and wanted to know if pt should have his blood work done while he was there. [name] would like for Dr. Jannifer Franklin or his nurse to call her back concerning this and also some other matters that she needs to go over with him concerning the pt. Thanks

## 2013-12-02 NOTE — Telephone Encounter (Signed)
I called patient. The patient will be having blood work done through the primary care doctor on 12/09/2013. We'll get a CBC, and a comprehensive metabolic profile. The patient does not have a revisit until almost July, I will need to see him next month. I will get work in revisit.

## 2013-12-26 ENCOUNTER — Other Ambulatory Visit: Payer: Self-pay | Admitting: Neurology

## 2013-12-27 ENCOUNTER — Other Ambulatory Visit: Payer: Self-pay | Admitting: Neurology

## 2013-12-28 ENCOUNTER — Ambulatory Visit (INDEPENDENT_AMBULATORY_CARE_PROVIDER_SITE_OTHER): Payer: Medicare Other | Admitting: Neurology

## 2013-12-28 ENCOUNTER — Encounter: Payer: Self-pay | Admitting: Neurology

## 2013-12-28 VITALS — BP 143/91 | HR 98 | Wt 212.0 lb

## 2013-12-28 DIAGNOSIS — G7000 Myasthenia gravis without (acute) exacerbation: Secondary | ICD-10-CM

## 2013-12-28 MED ORDER — PREDNISONE 5 MG PO TABS: 5.0000 mg | ORAL_TABLET | Freq: Every day | ORAL | Status: DC

## 2013-12-28 NOTE — Progress Notes (Signed)
Reason for visit: Myasthenia gravis  Victor Sutton is an 80 y.o. male  History of present illness:  Victor Sutton is an 80 year old right-handed white male with a history of myasthenia gravis with pharyngeal features. The patient has done quite well since last seen, currently on 30 mg of prednisone daily and on CellCept 500 mg twice daily. The patient is eating normally now, and the feeding tube was removed. The patient indicates that he has some fatigability of the legs, but otherwise he is doing well. The patient has had blood work done recently revealing a relatively normal CBC and liver profile. The patient returns for an evaluation.  Past Medical History  Diagnosis Date  . BPH (benign prostatic hyperplasia)   . Macular degeneration     (L) wet, (R) dry  . B12 deficiency     HIstory  . 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 Leukoplakia 2001    Treated for  . Hypertension   . Iron deficiency anemia   . Cataract 2009    bilateral cataract surgery  . GERD (gastroesophageal reflux disease)   . Hyperlipidemia     not on medication  . Ulcer   . Bell's palsy     at present  . Shortness of breath   . Peripheral neuropathy     Mild History of  . Hiatal hernia     hiatial hernia repair  02/08/13  . Cancer   . Dysphonia 03/16/2013  . Dysphagia, pharyngoesophageal phase 03/16/2013  . Myasthenia gravis 03/21/2013  . Disturbance of salivary secretion 06/20/2013    Past Surgical History  Procedure Laterality Date  . Cholecystectomy    . Arthroscopic knee surgery      left  . Multiple oral surgeries  2002  . Tonsillectomy    . Cateract surgeries       bilateral  . Stomach surgery  2005    states his stomach had moved up toward his chest , some type of surgery performed  . Hernia repair      Laparoscopic ventral  . Hiatal hernia repair  2004  . Skin cancer excision  10/2012    right leg-shin area  . Hiatal hernia repair N/A 02/08/2013    Procedure:  REPAIR OFCURRENT HIATAL  HERNIA, ;  Surgeon: Odis Hollingshead, MD;  Location: [facility];  Service: General;  Laterality: N/A;  . Upper gi endoscopy N/A 02/08/2013    Procedure: UPPER GI ENDOSCOPY;  Surgeon: Odis Hollingshead, MD;  Location: [facility];  Service: General;  Laterality: N/A;  . Laparoscopic lysis of adhesions N/A 02/08/2013    Procedure: LAPAROSCOPIC LYSIS OF ADHESIONS;  Surgeon: Odis Hollingshead, MD;  Location: [facility];  Service: General;  Laterality: N/A;  . Laparoscopic nissen fundoplication  5/40/0867    Procedure: LAPAROSCOPIC NISSEN FUNDOPLICATION;  Surgeon: Odis Hollingshead, MD;  Location: [facility];  Service: General;;  . Gastrostomy w/ feeding tube  [id number]    Family History  Problem Relation Age of Onset  . Prostate cancer Paternal Grandfather   . Breast cancer Sister   . CAD Sister   . COPD Sister   . Heart attack Father     Social history:  reports that he quit smoking about 37 years ago. He has never used smokeless tobacco. He reports that he drinks alcohol. He reports that he does not use illicit drugs.   No Known Allergies  Medications:  Current Outpatient Prescriptions on File Prior to Visit  Medication Sig Dispense Refill  . hydrochlorothiazide (HYDRODIURIL) 25 MG tablet Take 1 tablet (25 mg total) by mouth daily.  30 tablet  3  . mycophenolate (CELLCEPT) 250 MG capsule Take 2 capsules (500 mg total) by mouth 2 (two) times daily.  120 capsule  5  . pantoprazole (PROTONIX) 40 MG tablet Take 40 mg by mouth daily.      Marland Kitchen nystatin (MYCOSTATIN) 100000 UNIT/ML suspension Take 100,000 Units by mouth.        Current Facility-Administered Medications on File Prior to Visit  Medication Dose Route Frequency Provider Last Rate Last Dose  . botulinum toxin Type A (BOTOX) injection 100 Units  100 Units Intramuscular Once Hulen Luster, DO        ROS:  Out of a complete 14 system review of symptoms, the patient complains only of the following symptoms, and all other reviewed systems are  negative.  Weight gain Facial swelling  Excessive eating Leg swelling Frequency of urination Bruising easily Numbness, weakness Itching  Blood pressure 143/91, pulse 98, weight 212 lb (96.163 kg).  Physical Exam  General: The patient is alert and cooperative at the time of the examination.  Skin: 2+ edema of ankles is noted bilaterally.   Neurologic Exam  Mental status: The patient is oriented x 3.  Cranial nerves: Facial symmetry is present. Speech is normal, no aphasia or dysarthria is noted. Extraocular movements are full. Visual fields are full.  Motor: The patient has good strength in all 4 extremities, with exception that there is mild proximal weakness in the legs bilaterally. The patient has difficulty arising from a seated position with arms crossed..  Sensory examination: Soft touch sensation is symmetric on the face, arms, and legs.  Coordination: The patient has good finger-nose-finger and heel-to-shin bilaterally.  Gait and station: The patient has a normal gait. Tandem gait is unsteady. Romberg is negative. No drift is seen.  Reflexes: Deep tendon reflexes are symmetric.   Assessment/Plan:  1. Myasthenia gravis with pharyngeal features  The patient doing quite well at this time, and we will initiate a taper off of the prednisone. The patient will go to 25 mg daily of prednisone for 6 weeks, and then go to 20 mg daily. He will followup in July, we will continue the prednisone taper at that time. Hopefully, some of the leg weakness may be related to the use of steroids, not to the myasthenia.  Jill Alexanders MD 12/28/2013 7:21 PM  [facility] [address] [place], [place] [address]  Phone [phone] Fax [phone]

## 2013-12-28 NOTE — Patient Instructions (Addendum)
   Reduce the prednisone dose to 25 mg daily for 6 weeks, then take 20 mg daily.    Myasthenia Gravis Myasthenia gravis is a disease that causes muscle weakness throughout the body. The muscles affected are the ones we can control (voluntary muscles). An example of a voluntary muscle is your hand muscles. You can control the muscles to make the hand pick something up. An example of an involuntary muscle is the heart. The heart beats without any direction from you.  Myasthenia Gravis is thought to be an autoimmune disease. That means that normal defenses of the body begin to attack the body. In this case, the immune system begins to attack cells located at the junctions of the muscles and the nerves. Women are affected more often. Women are affected at a younger age than men. Babies born to affected women frequently develop symptoms at an early age. SYMPTOMS Initially in the disease, the facial muscles are affected first. After this, a person may develop droopy eyelids. They may have difficulty controlling facial muscles. They may have problems chewing. Swallowing and speaking may become impaired. The weakness gradually spreads to the arms and legs. It begins to affect breathing. Sometimes, the symptoms lessen or go away without any apparent cause. DIAGNOSIS  Diagnosis can be made with blood tests. Tests such as electromyography may be done to examine the electrical activity in the muscle. An improvement in symptoms after having an anti-cholinesterase drug helps confirm the diagnosis.  TREATMENT  Medicines are usually prescribed as the first treatment. These medicines help, but they do not cure the disease. A plasma cleansing procedure (plasmapheresis) can be used to treat a crisis. It can also be used to prepare a person for surgery. This procedure produces short-term improvement. Some cases are helped by removing the thymus gland. Steroids are used for short-term benefits. Document Released:  12/08/2000 Document Revised: 11/24/2011 Document Reviewed: 09/01/2005 [facility] Patient Information 2014 [employer] [place].

## 2014-01-16 ENCOUNTER — Other Ambulatory Visit: Payer: Self-pay | Admitting: Neurology

## 2014-01-25 ENCOUNTER — Encounter (HOSPITAL_COMMUNITY): Payer: Self-pay

## 2014-01-25 ENCOUNTER — Other Ambulatory Visit (HOSPITAL_COMMUNITY): Payer: Self-pay | Admitting: Internal Medicine

## 2014-01-25 ENCOUNTER — Ambulatory Visit (HOSPITAL_COMMUNITY)
Admission: RE | Admit: 2014-01-25 | Discharge: 2014-01-25 | Disposition: A | Discharge status: Home / Self Care | Payer: Medicare Other | Source: Ambulatory Visit | Attending: Internal Medicine | Admitting: Internal Medicine

## 2014-01-25 DIAGNOSIS — M810 Age-related osteoporosis without current pathological fracture: Secondary | ICD-10-CM | POA: Insufficient documentation

## 2014-01-25 MED ORDER — ZOLEDRONIC ACID 5 MG/100ML IV SOLN
5.0000 mg | Freq: Once | INTRAVENOUS | Status: AC
  Administered 2014-01-25: 5 mg via INTRAVENOUS
  Filled 2014-01-25: qty 100

## 2014-01-25 MED ORDER — SODIUM CHLORIDE 0.9 % IV SOLN
Freq: Once | INTRAVENOUS | Status: AC
  Administered 2014-01-25: 14:00:00 via INTRAVENOUS

## 2014-01-25 NOTE — Discharge Instructions (Signed)
Drink  Fluids/water as tolerated over the next 72 hours Tylenol or ibuprofen as directed if needed for aches and pains  Continue Calcium and Vit D as directed by your MD     Reclast Zoledronic Acid injection (Paget's Disease, Osteoporosis) What is this medicine? ZOLEDRONIC ACID (ZOE le dron ik AS id) lowers the amount of calcium loss from bone. It is used to treat Paget's disease and osteoporosis in women. This medicine may be used for other purposes; ask your health care provider or pharmacist if you have questions. COMMON BRAND NAME(S): Reclast, Zometa What should I tell my health care provider before I take this medicine? They need to know if you have any of these conditions: -aspirin-sensitive asthma -cancer, especially if you are receiving medicines used to treat cancer -dental disease or wear dentures -infection -kidney disease -low levels of calcium in the blood -past surgery on the parathyroid gland or intestines -receiving corticosteroids like dexamethasone or prednisone -an unusual or allergic reaction to zoledronic acid, other medicines, foods, dyes, or preservatives -pregnant or trying to get pregnant -breast-feeding How should I use this medicine? This medicine is for infusion into a vein. It is given by a health care professional in a hospital or clinic setting. Talk to your pediatrician regarding the use of this medicine in children. This medicine is not approved for use in children. Overdosage: If you think you have taken too much of this medicine contact a poison control center or emergency room at once. NOTE: This medicine is only for you. Do not share this medicine with others. What if I miss a dose? It is important not to miss your dose. Call your doctor or health care professional if you are unable to keep an appointment. What may interact with this medicine? -certain antibiotics given by injection -NSAIDs, medicines for pain and inflammation, like ibuprofen or  naproxen -some diuretics like bumetanide, furosemide -teriparatide This list may not describe all possible interactions. Give your health care provider a list of all the medicines, herbs, non-prescription drugs, or dietary supplements you use. Also tell them if you smoke, drink alcohol, or use illegal drugs. Some items may interact with your medicine. What should I watch for while using this medicine? Visit your doctor or health care professional for regular checkups. It may be some time before you see the benefit from this medicine. Do not stop taking your medicine unless your doctor tells you to. Your doctor may order blood tests or other tests to see how you are doing. Women should inform their doctor if they wish to become pregnant or think they might be pregnant. There is a potential for serious side effects to an unborn child. Talk to your health care professional or pharmacist for more information. You should make sure that you get enough calcium and vitamin D while you are taking this medicine. Discuss the foods you eat and the vitamins you take with your health care professional. Some people who take this medicine have severe bone, joint, and/or muscle pain. This medicine may also increase your risk for jaw problems or a broken thigh bone. Tell your doctor right away if you have severe pain in your jaw, bones, joints, or muscles. Tell your doctor if you have any pain that does not go away or that gets worse. Tell your dentist and dental surgeon that you are taking this medicine. You should not have major dental surgery while on this medicine. See your dentist to have a dental exam and fix  any dental problems before starting this medicine. Take good care of your teeth while on this medicine. Make sure you see your dentist for regular follow-up appointments. What side effects may I notice from receiving this medicine? Side effects that you should report to your doctor or health care professional as  soon as possible: -allergic reactions like skin rash, itching or hives, swelling of the face, lips, or tongue -anxiety, confusion, or depression -breathing problems -changes in vision -eye pain -feeling faint or lightheaded, falls -jaw pain, especially after dental work -mouth sores -muscle cramps, stiffness, or weakness -trouble passing urine or change in the amount of urine Side effects that usually do not require medical attention (report to your doctor or health care professional if they continue or are bothersome): -bone, joint, or muscle pain -constipation -diarrhea -fever -hair loss -irritation at site where injected -loss of appetite -nausea, vomiting -stomach upset -trouble sleeping -trouble swallowing -weak or tired This list may not describe all possible side effects. Call your doctor for medical advice about side effects. You may report side effects to FDA at 1-800-FDA-1088. Where should I keep my medicine? This drug is given in a hospital or clinic and will not be stored at home. NOTE: This sheet is a summary. It may not cover all possible information. If you have questions about this medicine, talk to your doctor, pharmacist, or health care provider.  2014, [employer]. (2013-02-14 10:03:48)

## 2014-02-11 ENCOUNTER — Emergency Department (HOSPITAL_COMMUNITY): Payer: Medicare Other

## 2014-02-11 ENCOUNTER — Emergency Department (HOSPITAL_COMMUNITY)
Admission: EM | Admit: 2014-02-11 | Discharge: 2014-02-11 | Disposition: A | Discharge status: Home / Self Care | Payer: Medicare Other | Attending: Emergency Medicine | Admitting: Emergency Medicine

## 2014-02-11 ENCOUNTER — Encounter (HOSPITAL_COMMUNITY): Payer: Self-pay | Admitting: Emergency Medicine

## 2014-02-11 DIAGNOSIS — I10 Essential (primary) hypertension: Secondary | ICD-10-CM | POA: Insufficient documentation

## 2014-02-11 DIAGNOSIS — Z8669 Personal history of other diseases of the nervous system and sense organs: Secondary | ICD-10-CM | POA: Insufficient documentation

## 2014-02-11 DIAGNOSIS — E785 Hyperlipidemia, unspecified: Secondary | ICD-10-CM | POA: Insufficient documentation

## 2014-02-11 DIAGNOSIS — N3091 Cystitis, unspecified with hematuria: Secondary | ICD-10-CM

## 2014-02-11 DIAGNOSIS — Z79899 Other long term (current) drug therapy: Secondary | ICD-10-CM | POA: Insufficient documentation

## 2014-02-11 DIAGNOSIS — Z872 Personal history of diseases of the skin and subcutaneous tissue: Secondary | ICD-10-CM | POA: Insufficient documentation

## 2014-02-11 DIAGNOSIS — Z8639 Personal history of other endocrine, nutritional and metabolic disease: Secondary | ICD-10-CM | POA: Insufficient documentation

## 2014-02-11 DIAGNOSIS — Z9849 Cataract extraction status, unspecified eye: Secondary | ICD-10-CM | POA: Insufficient documentation

## 2014-02-11 DIAGNOSIS — Z859 Personal history of malignant neoplasm, unspecified: Secondary | ICD-10-CM | POA: Insufficient documentation

## 2014-02-11 DIAGNOSIS — K219 Gastro-esophageal reflux disease without esophagitis: Secondary | ICD-10-CM | POA: Insufficient documentation

## 2014-02-11 DIAGNOSIS — Z87891 Personal history of nicotine dependence: Secondary | ICD-10-CM | POA: Insufficient documentation

## 2014-02-11 DIAGNOSIS — N3090 Cystitis, unspecified without hematuria: Secondary | ICD-10-CM | POA: Insufficient documentation

## 2014-02-11 DIAGNOSIS — Z862 Personal history of diseases of the blood and blood-forming organs and certain disorders involving the immune mechanism: Secondary | ICD-10-CM | POA: Insufficient documentation

## 2014-02-11 DIAGNOSIS — Z7983 Long term (current) use of bisphosphonates: Secondary | ICD-10-CM | POA: Insufficient documentation

## 2014-02-11 LAB — URINALYSIS, ROUTINE W REFLEX MICROSCOPIC
Glucose, UA: NEGATIVE mg/dL
Ketones, ur: NEGATIVE mg/dL
Nitrite: NEGATIVE
Protein, ur: 100 mg/dL — AB
Specific Gravity, Urine: 1.02 (ref 1.005–1.030)
Urobilinogen, UA: 1 mg/dL (ref 0.0–1.0)
pH: 7 (ref 5.0–8.0)

## 2014-02-11 LAB — I-STAT CHEM 8, ED
BUN: 13 mg/dL (ref 6–23)
Calcium, Ion: 1.17 mmol/L (ref 1.13–1.30)
Chloride: 100 mEq/L (ref 96–112)
Creatinine, Ser: 1.1 mg/dL (ref 0.50–1.35)
Glucose, Bld: 105 mg/dL — ABNORMAL HIGH (ref 70–99)
HCT: 45 % (ref 39.0–52.0)
Hemoglobin: 15.3 g/dL (ref 13.0–17.0)
Potassium: 3 mEq/L — ABNORMAL LOW (ref 3.7–5.3)
Sodium: 143 mEq/L (ref 137–147)
TCO2: 27 mmol/L (ref 0–100)

## 2014-02-11 LAB — CBC WITH DIFFERENTIAL/PLATELET
Basophils Absolute: 0 10*3/uL (ref 0.0–0.1)
Basophils Relative: 0 % (ref 0–1)
Eosinophils Absolute: 0.1 10*3/uL (ref 0.0–0.7)
Eosinophils Relative: 1 % (ref 0–5)
HCT: 44.3 % (ref 39.0–52.0)
Hemoglobin: 14.3 g/dL (ref 13.0–17.0)
Lymphocytes Relative: 10 % — ABNORMAL LOW (ref 12–46)
Lymphs Abs: 1 10*3/uL (ref 0.7–4.0)
MCH: 30.4 pg (ref 26.0–34.0)
MCHC: 32.3 g/dL (ref 30.0–36.0)
MCV: 94.1 fL (ref 78.0–100.0)
Monocytes Absolute: 1.2 10*3/uL — ABNORMAL HIGH (ref 0.1–1.0)
Monocytes Relative: 11 % (ref 3–12)
Neutro Abs: 8.6 10*3/uL — ABNORMAL HIGH (ref 1.7–7.7)
Neutrophils Relative %: 78 % — ABNORMAL HIGH (ref 43–77)
Platelets: 259 10*3/uL (ref 150–400)
RBC: 4.71 MIL/uL (ref 4.22–5.81)
RDW: 13.7 % (ref 11.5–15.5)
WBC: 10.9 10*3/uL — ABNORMAL HIGH (ref 4.0–10.5)

## 2014-02-11 LAB — URINE MICROSCOPIC-ADD ON

## 2014-02-11 MED ORDER — POTASSIUM CHLORIDE CRYS ER 20 MEQ PO TBCR
40.0000 meq | EXTENDED_RELEASE_TABLET | Freq: Once | ORAL | Status: AC
  Administered 2014-02-11: 40 meq via ORAL
  Filled 2014-02-11: qty 2

## 2014-02-11 MED ORDER — CIPROFLOXACIN HCL 500 MG PO TABS: 500.0000 mg | ORAL_TABLET | Freq: Two times a day (BID) | ORAL | Status: DC

## 2014-02-11 NOTE — Discharge Instructions (Signed)
Please follow up with the urology to schedule a follow up appointment. Please follow up with your primary care physician in 1-2 days. If you do not have one please call the [facility] number listed above. Please take your antibiotic until completion. Please read all discharge instructions and return precautions.   Urinary Tract Infection Urinary tract infections (UTIs) can develop anywhere along your urinary tract. Your urinary tract is your body's drainage system for removing wastes and extra water. Your urinary tract includes two kidneys, two ureters, a bladder, and a urethra. Your kidneys are a pair of bean-shaped organs. Each kidney is about the size of your fist. They are located below your ribs, one on each side of your spine. CAUSES Infections are caused by microbes, which are microscopic organisms, including fungi, viruses, and bacteria. These organisms are so small that they can only be seen through a microscope. Bacteria are the microbes that most commonly cause UTIs. SYMPTOMS  Symptoms of UTIs may vary by age and gender of the patient and by the location of the infection. Symptoms in young women typically include a frequent and intense urge to urinate and a painful, burning feeling in the bladder or urethra during urination. Older women and men are more likely to be tired, shaky, and weak and have muscle aches and abdominal pain. A fever may mean the infection is in your kidneys. Other symptoms of a kidney infection include pain in your back or sides below the ribs, nausea, and vomiting. DIAGNOSIS To diagnose a UTI, your caregiver will ask you about your symptoms. Your caregiver also will ask to provide a urine sample. The urine sample will be tested for bacteria and white blood cells. White blood cells are made by your body to help fight infection. TREATMENT  Typically, UTIs can be treated with medication. Because most UTIs are caused by a bacterial infection, they usually  can be treated with the use of antibiotics. The choice of antibiotic and length of treatment depend on your symptoms and the type of bacteria causing your infection. HOME CARE INSTRUCTIONS  If you were prescribed antibiotics, take them exactly as your caregiver instructs you. Finish the medication even if you feel better after you have only taken some of the medication.  Drink enough water and fluids to keep your urine clear or pale yellow.  Avoid caffeine, tea, and carbonated beverages. They tend to irritate your bladder.  Empty your bladder often. Avoid holding urine for long periods of time.  Empty your bladder before and after sexual intercourse.  After a bowel movement, women should cleanse from front to back. Use each tissue only once. SEEK MEDICAL CARE IF:   You have back pain.  You develop a fever.  Your symptoms do not begin to resolve within 3 days. SEEK IMMEDIATE MEDICAL CARE IF:   You have severe back pain or lower abdominal pain.  You develop chills.  You have nausea or vomiting.  You have continued burning or discomfort with urination. MAKE SURE YOU:   Understand these instructions.  Will watch your condition.  Will get help right away if you are not doing well or get worse. Document Released: 06/11/2005 Document Revised: 03/02/2012 Document Reviewed: 10/10/2011 [facility] Patient Information 2014 [place].  Hematuria, Adult Hematuria is blood in your urine. It can be caused by a bladder infection, kidney infection, prostate infection, kidney stone, or cancer of your urinary tract. Infections can usually be treated with medicine, and a kidney stone  usually will pass through your urine. If neither of these is the cause of your hematuria, further workup to find out the reason may be needed. It is very important that you tell your health care provider about any blood you see in your urine, even if the blood stops without treatment or happens without causing  pain. Blood in your urine that happens and then stops and then happens again can be a symptom of a very serious condition. Also, pain is not a symptom in the initial stages of many urinary cancers. HOME CARE INSTRUCTIONS   Drink lots of fluid, 3 4 quarts a day. If you have been diagnosed with an infection, cranberry juice is especially recommended, in addition to large amounts of water.  Avoid caffeine, tea, and carbonated beverages, because they tend to irritate the bladder.  Avoid alcohol because it may irritate the prostate.  Only take over-the-counter or prescription medicines for pain, discomfort, or fever as directed by your health care provider.  If you have been diagnosed with a kidney stone, follow your health care provider's instructions regarding straining your urine to catch the stone.  Empty your bladder often. Avoid holding urine for long periods of time.  After a bowel movement, women should cleanse front to back. Use each tissue only once.  Empty your bladder before and after sexual intercourse if you are a male. SEEK MEDICAL CARE IF: You develop back pain, fever, a feeling of sickness in your stomach (nausea), or vomiting or if your symptoms are not better in 3 days. Return sooner if you are getting worse. SEEK IMMEDIATE MEDICAL CARE IF:   You have a persistent fever, with a temperature of 101.40F (38.8C) or greater.  You develop severe vomiting and are unable to keep the medicine down.  You develop severe back or abdominal pain despite taking your medicines.  You begin passing a large amount of blood or clots in your urine.  You feel extremely weak or faint, or you pass out. MAKE SURE YOU:   Understand these instructions.  Will watch your condition.  Will get help right away if you are not doing well or get worse. Document Released: 09/01/2005 Document Revised: 06/22/2013 Document Reviewed: 05/02/2013 [facility] Patient Information 2014 [place].

## 2014-02-11 NOTE — ED Notes (Signed)
Pt states that he was having dysuria, frequency last week.  This morning had 3 clots of blood in his urine.  No pain.

## 2014-02-11 NOTE — ED Provider Notes (Signed)
CSN: [id number]     Arrival date & time 02/11/14  0943 History   First MD Initiated Contact with Patient 02/11/14 1027     Chief Complaint  Patient presents with  . Hematuria     (Consider location/radiation/quality/duration/timing/severity/associated sxs/prior Treatment) HPI Comments: Patient is an 80 yo M PMHx significant for BPH, Myasthenia gravis, HLD, HTN presenting to the ED for urinary frequency over the last week since receiving reclast. The patient states he developed hematuria this morning, at first passed three blood clots, and now is having constant hematuria. He denies any pain anywhere that feels different from his chronic myalgias related to his myasthenia gravis. Denies any fevers, chills, nausea, vomiting, diarrhea, dysuria, rectal pain, constipation or diarrhea. Abdominal surgical history includes hiatal hernia repair.   Patient is a 80 y.o. male presenting with hematuria.  Hematuria Pertinent negatives include no abdominal pain, chills, fever, nausea or vomiting.    Past Medical History  Diagnosis Date  . BPH (benign prostatic hyperplasia)   . Macular degeneration     (L) wet, (R) dry  . B12 deficiency     [facility]  . 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 Leukoplakia 2001    Treated for  . Hypertension   . Iron deficiency anemia   . Cataract 2009    bilateral cataract surgery  . GERD (gastroesophageal reflux disease)   . Hyperlipidemia     not on medication  . Ulcer   . Bell's palsy     at present  . Shortness of breath   . Peripheral neuropathy     Mild History of  . Hiatal hernia     hiatial hernia repair  02/08/13  . Cancer   . Dysphonia 03/16/2013  . Dysphagia, pharyngoesophageal phase 03/16/2013  . Myasthenia gravis 03/21/2013  . Disturbance of salivary secretion 06/20/2013   Past Surgical History  Procedure Laterality Date  . Cholecystectomy    . Arthroscopic knee surgery      left  . Multiple oral surgeries  2002  . Tonsillectomy    . Cateract  surgeries       bilateral  . Stomach surgery  2005    states his stomach had moved up toward his chest , some type of surgery performed  . Hernia repair      Laparoscopic ventral  . Hiatal hernia repair  2004  . Skin cancer excision  10/2012    right leg-shin area  . Hiatal hernia repair N/A 02/08/2013    Procedure:  REPAIR OFCURRENT HIATAL HERNIA, ;  Surgeon: Odis Hollingshead, MD;  Location: [facility];  Service: General;  Laterality: N/A;  . Upper gi endoscopy N/A 02/08/2013    Procedure: UPPER GI ENDOSCOPY;  Surgeon: Odis Hollingshead, MD;  Location: [facility];  Service: General;  Laterality: N/A;  . Laparoscopic lysis of adhesions N/A 02/08/2013    Procedure: LAPAROSCOPIC LYSIS OF ADHESIONS;  Surgeon: Odis Hollingshead, MD;  Location: [facility];  Service: General;  Laterality: N/A;  . Laparoscopic nissen fundoplication  8/84/1660    Procedure: LAPAROSCOPIC NISSEN FUNDOPLICATION;  Surgeon: Odis Hollingshead, MD;  Location: [facility];  Service: General;;  . Gastrostomy w/ feeding tube  [id number]   Family History  Problem Relation Age of Onset  . Prostate cancer Paternal Grandfather   . Breast cancer Sister   . CAD Sister   . COPD Sister   . Heart attack Father    History  Substance Use Topics  . Smoking  status: Former Smoker    Quit date: 09/15/1976  . Smokeless tobacco: Never Used     Comment: Quit 1980  . Alcohol Use: Yes     Comment: Moderate alcohol, wine    Review of Systems  Constitutional: Negative for fever and chills.  Gastrointestinal: Negative for nausea, vomiting, abdominal pain and diarrhea.  Genitourinary: Positive for frequency and hematuria. Negative for dysuria, urgency, flank pain, decreased urine volume, discharge, penile swelling, scrotal swelling, enuresis, difficulty urinating, genital sores, penile pain and testicular pain.  All other systems reviewed and are negative.     Allergies  Review of patient's allergies indicates no known allergies.  Home  Medications   Prior to Admission medications   Medication Sig Start Date End Date Taking? Authorizing Provider  hydrochlorothiazide (HYDRODIURIL) 25 MG tablet Take 25 mg by mouth daily.   Yes Historical Provider, MD  mycophenolate (CELLCEPT) 250 MG capsule Take 500 mg by mouth 2 (two) times daily.   Yes Historical Provider, MD  pantoprazole (PROTONIX) 40 MG tablet Take 40 mg by mouth every morning.  09/27/13  Yes Historical Provider, MD  predniSONE (DELTASONE) 10 MG tablet Take 20 mg by mouth daily with breakfast.   Yes Historical Provider, MD  Vitamin D, Ergocalciferol, (DRISDOL) 50000 UNITS CAPS capsule Take 50,000 Units by mouth every 7 (seven) days. ON FRIDAYS   Yes Historical Provider, MD  zoledronic acid (RECLAST) 5 MG/100ML SOLN injection Inject 5 mg into the vein once.   Yes Historical Provider, MD  ciprofloxacin (CIPRO) 500 MG tablet Take 1 tablet (500 mg total) by mouth 2 (two) times daily. 02/11/14   Lexa Coronado L Jash Wahlen, PA-C   BP 136/78  Pulse 106  Temp(Src) 97.9 F (36.6 C) (Oral)  Resp 18  SpO2 100% Physical Exam  Nursing note and vitals reviewed. Constitutional: He is oriented to person, place, and time. He appears well-developed and well-nourished. No distress.  HENT:  Head: Normocephalic and atraumatic.  Right Ear: External ear normal.  Left Ear: External ear normal.  Nose: Nose normal.  Mouth/Throat: Oropharynx is clear and moist. No oropharyngeal exudate.  Eyes: Conjunctivae are normal.  Neck: Normal range of motion. Neck supple.  Cardiovascular: Normal rate, regular rhythm and normal heart sounds.   Pulmonary/Chest: Effort normal and breath sounds normal. No respiratory distress.  Abdominal: Soft. Bowel sounds are normal. He exhibits no distension. There is no tenderness. There is no rigidity, no rebound, no guarding and no CVA tenderness.  Genitourinary: Rectum normal, testes normal and penis normal. Prostate is enlarged. Prostate is not tender. Uncircumcised. No  phimosis, paraphimosis, hypospadias, penile erythema or penile tenderness. No discharge found.  Stool brown on DRE.   Musculoskeletal: Normal range of motion. He exhibits no edema.  Lymphadenopathy:       Right: No inguinal adenopathy present.       Left: No inguinal adenopathy present.  Neurological: He is alert and oriented to person, place, and time.  Skin: Skin is warm and dry. He is not diaphoretic.  Psychiatric: He has a normal mood and affect.    ED Course  Procedures (including critical care time) Medications  potassium chloride SA (K-DUR,KLOR-CON) CR tablet 40 mEq (40 mEq Oral Given 02/11/14 1250)    Labs Review Labs Reviewed  URINALYSIS, ROUTINE W REFLEX MICROSCOPIC - Abnormal; Notable for the following:    Color, Urine RED (*)    APPearance TURBID (*)    Hgb urine dipstick LARGE (*)    Bilirubin Urine SMALL (*)  Protein, ur 100 (*)    Leukocytes, UA MODERATE (*)    All other components within normal limits  CBC WITH DIFFERENTIAL - Abnormal; Notable for the following:    WBC 10.9 (*)    Neutrophils Relative % 78 (*)    Neutro Abs 8.6 (*)    Lymphocytes Relative 10 (*)    Monocytes Absolute 1.2 (*)    All other components within normal limits  I-STAT CHEM 8, ED - Abnormal; Notable for the following:    Potassium 3.0 (*)    Glucose, Bld 105 (*)    All other components within normal limits  URINE CULTURE  URINE MICROSCOPIC-ADD ON    Imaging Review Ct Abdomen Pelvis Wo Contrast  02/11/2014   CLINICAL DATA:  Evaluate for kidney stone.  EXAM: CT ABDOMEN AND PELVIS WITHOUT CONTRAST  TECHNIQUE: Multidetector CT imaging of the abdomen and pelvis was performed following the standard protocol without IV contrast.  COMPARISON:  None.  FINDINGS: Atherosclerotic disease involves the LAD, left circumflex and RCA coronary arteries. The lung bases are clear. There is no focal liver abnormality. Previous cholecystectomy. No biliary dilatation. Normal appearance of the pancreas.  The spleen is normal.  The adrenal glands both appear normal. The right kidney is normal. No right-sided nephrolithiasis or hydronephrosis. Normal appearance of the left kidney. No left nephrolithiasis or obstructive uropathy. Urinary bladder appears normal for degree of distention.  Mild calcified atherosclerotic disease involves the abdominal aorta. No aneurysm. There is no abdominal adenopathy. No pelvic or inguinal adenopathy. No free fluid or fluid collections within the abdomen or pelvis.  Patient is status post repair of hiatal hernia. The stomach is otherwise unremarkable in appearance. The small bowel loops are normal. The appendix is visualized and appears within normal limits. Normal appearance of the proximal colon. Multiple distal colonic diverticula identified without acute inflammation.  Previous mesh repair of ventral abdominal wall hernia noted. The mesh appears intact.  Review of the visualized bony structures is significant for mild spondylosis within the lumbar spine.  IMPRESSION: 1. No evidence for nephrolithiasis or obstructive uropathy. 2. Atherosclerotic disease including multi vessel coronary artery calcifications.   Electronically Signed   By: Kerby Moors M.D.   On: 02/11/2014 12:22     EKG Interpretation None        MDM   Final diagnoses:  Hemorrhagic cystitis    Filed Vitals:   02/11/14 1256  BP: 136/78  Pulse: 106  Temp:   Resp: 18    Afebrile, NAD, non-toxic appearing, AAOx4. Abdomen soft, nontender, nondistended. No peritoneal signs. Prostate is enlarged but not tender consistent with BPH. GU exam is unremarkable. Urine consistent with hemorrhagic cystitis. CT abdomen pelvis no evidence of stone or mass. Plan treat with antibiotics, urine culture is sent. Advised patient followup with his urologist at [facility] urology if symptoms not improving after a few days of taking the antibiotic. Return precautions discussed. Patient is agreeable to plan. Patient is  stable at time of discharge. Patient d/w with Dr. Wilson Singer, agrees with plan.   3:23 PM Pharmacy called, contraindication for cipro use with Myasthenia Gravis, will prescribed Septra BID x 7 days instead. Called Mr. Clauson at home to discuss this with him. He will pick up the corrected antibiotic.     Harlow Mares, PA-C 02/11/14 1531

## 2014-02-12 LAB — URINE CULTURE
Colony Count: NO GROWTH
Culture: NO GROWTH

## 2014-02-13 ENCOUNTER — Other Ambulatory Visit: Payer: Self-pay | Admitting: Dermatology

## 2014-02-16 NOTE — ED Provider Notes (Signed)
Medical screening examination/treatment/procedure(s) were performed by non-physician practitioner and as supervising physician I was immediately available for consultation/collaboration.   EKG Interpretation None       Kirah Stice, MD 02/16/14 0210 

## 2014-02-24 ENCOUNTER — Other Ambulatory Visit: Payer: Self-pay | Admitting: Neurology

## 2014-03-22 ENCOUNTER — Encounter: Payer: Self-pay | Admitting: Neurology

## 2014-03-22 ENCOUNTER — Ambulatory Visit (INDEPENDENT_AMBULATORY_CARE_PROVIDER_SITE_OTHER): Payer: Medicare Other | Admitting: Neurology

## 2014-03-22 VITALS — BP 133/85 | HR 99 | Wt 214.0 lb

## 2014-03-22 DIAGNOSIS — Z5181 Encounter for therapeutic drug level monitoring: Secondary | ICD-10-CM

## 2014-03-22 DIAGNOSIS — G7000 Myasthenia gravis without (acute) exacerbation: Secondary | ICD-10-CM

## 2014-03-22 DIAGNOSIS — N400 Enlarged prostate without lower urinary tract symptoms: Secondary | ICD-10-CM

## 2014-03-22 MED ORDER — PREDNISONE 5 MG PO TABS: ORAL_TABLET | ORAL | Status: DC

## 2014-03-22 NOTE — Progress Notes (Signed)
Reason for visit: Myasthenia gravis  DODGE ATOR is an 80 y.o. male  History of present illness:  [name] is an 80 year old right-handed white male with a history of myasthenia gravis fairly with pharyngeal features. He is on CellCept and prednisone, currently on 20 mg of prednisone daily. As he has come down on the prednisone dose, and the lower extremity weakness has improved. He no longer finds it difficult to get out of a chair or to go up stairs. He recently has had a significant elevation in his PSA associated with hematuria. The recent PSA check was starting to normalize. He also was found to have significant hypokalemia, and he is on a diuretic currently. The patient denies any ptosis, double vision, or problems swallowing. He is doing well in regards to his myasthenic symptoms. He currently is on 500 mg twice daily of the CellCept. He returns for an evaluation.  Past Medical History  Diagnosis Date  . BPH (benign prostatic hyperplasia)   . Macular degeneration     (L) wet, (R) dry  . B12 deficiency     HIstory  . 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 Leukoplakia 2001    Treated for  . Hypertension   . Iron deficiency anemia   . Cataract 2009    bilateral cataract surgery  . GERD (gastroesophageal reflux disease)   . Hyperlipidemia     not on medication  . Ulcer   . Bell's palsy     at present  . Shortness of breath   . Peripheral neuropathy     Mild History of  . Hiatal hernia     hiatial hernia repair  02/08/13  . Cancer   . Dysphonia 03/16/2013  . Dysphagia, pharyngoesophageal phase 03/16/2013  . Myasthenia gravis 03/21/2013  . Disturbance of salivary secretion 06/20/2013    Past Surgical History  Procedure Laterality Date  . Cholecystectomy    . Arthroscopic knee surgery      left  . Multiple oral surgeries  2002  . Tonsillectomy    . Cateract surgeries       bilateral  . Stomach surgery  2005    states his stomach had moved up toward his chest , some  type of surgery performed  . Hernia repair      Laparoscopic ventral  . Hiatal hernia repair  2004  . Skin cancer excision  10/2012    right leg-shin area  . Hiatal hernia repair N/A 02/08/2013    Procedure:  REPAIR OFCURRENT HIATAL HERNIA, ;  Surgeon: Odis Hollingshead, MD;  Location: [facility];  Service: General;  Laterality: N/A;  . Upper gi endoscopy N/A 02/08/2013    Procedure: UPPER GI ENDOSCOPY;  Surgeon: Odis Hollingshead, MD;  Location: [facility];  Service: General;  Laterality: N/A;  . Laparoscopic lysis of adhesions N/A 02/08/2013    Procedure: LAPAROSCOPIC LYSIS OF ADHESIONS;  Surgeon: Odis Hollingshead, MD;  Location: [facility];  Service: General;  Laterality: N/A;  . Laparoscopic nissen fundoplication  03/14/5283    Procedure: LAPAROSCOPIC NISSEN FUNDOPLICATION;  Surgeon: Odis Hollingshead, MD;  Location: [facility];  Service: General;;  . Gastrostomy w/ feeding tube  [id number]    Family History  Problem Relation Age of Onset  . Prostate cancer Paternal Grandfather   . Breast cancer Sister   . CAD Sister   . COPD Sister   . Heart attack Father     Social history:  reports that  he quit smoking about 37 years ago. He has never used smokeless tobacco. He reports that he drinks alcohol. He reports that he does not use illicit drugs.   No Known Allergies  Medications:  Current Outpatient Prescriptions on File Prior to Visit  Medication Sig Dispense Refill  . hydrochlorothiazide (HYDRODIURIL) 25 MG tablet Take 25 mg by mouth daily.      . mycophenolate (CELLCEPT) 250 MG capsule Take 500 mg by mouth 2 (two) times daily.      . pantoprazole (PROTONIX) 40 MG tablet Take 40 mg by mouth every morning.       . predniSONE (DELTASONE) 10 MG tablet Take 10 mg by mouth daily with breakfast.       . Vitamin D, Ergocalciferol, (DRISDOL) 50000 UNITS CAPS capsule Take 50,000 Units by mouth every 7 (seven) days. ON FRIDAYS      . zoledronic acid (RECLAST) 5 MG/100ML SOLN injection Inject 5 mg into the  vein once.       Current Facility-Administered Medications on File Prior to Visit  Medication Dose Route Frequency Provider Last Rate Last Dose  . botulinum toxin Type A (BOTOX) injection 100 Units  100 Units Intramuscular Once Hulen Luster, DO        ROS:  Out of a complete 14 system review of symptoms, the patient complains only of the following symptoms, and all other reviewed systems are negative.  Fatigue Facial swelling  Leg swelling Cold intolerance Frequent waking, daytime sleepiness, snoring Frequency of urination, blood in the urine Walking difficulties Easy bruising Weakness Agitation, depression  Blood pressure 133/85, pulse 99, weight 214 lb (97.07 kg).  Physical Exam  General: The patient is alert and cooperative at the time of the examination.  Skin: 2+ edema is noted in the ankles bilaterally.   Neurologic Exam  Mental status: The patient is oriented x 3.  Cranial nerves: Facial symmetry is present. Speech is normal, no aphasia or dysarthria is noted. Extraocular movements are full. Visual fields are full. Jaw muscle strength was normal. With the superior gaze for 1 minute, no ptosis or double vision was noted.  Motor: The patient has good strength in all 4 extremities. With the arms outstretched for 1 minute, no fatigable weakness of the deltoid muscles was seen.  Sensory examination: Soft touch sensation is symmetric on the face, arms, or legs.  Coordination: The patient has good finger-nose-finger and heel-to-shin bilaterally.  Gait and station: The patient has a normal gait. Tandem gait is slightly unsteady. Romberg is negative. No drift is seen.  Reflexes: Deep tendon reflexes are symmetric.   Assessment/Plan:  1. Myasthenia gravis  The patient is doing well currently. The patient will undergo blood work today, if the potassium level remains low, he may require supplementation. The patient will drop the prednisone dose to 17.5 mg daily  for 6 weeks, and then go to 15 mg daily, and he will remain on this dose. The patient will followup in 4 months. A prescription was given for the 5 mg prednisone tablets.  Jill Alexanders MD 03/22/2014 9:10 PM  [facility] [address] [place], [place] [address]  Phone [phone] Fax [phone]

## 2014-03-22 NOTE — Patient Instructions (Signed)
Myasthenia Gravis Myasthenia gravis is a disease that causes muscle weakness throughout the body. The muscles affected are the ones we can control (voluntary muscles). An example of a voluntary muscle is your hand muscles. You can control the muscles to make the hand pick something up. An example of an involuntary muscle is the heart. The heart beats without any direction from you.  Myasthenia Gravis is thought to be an autoimmune disease. That means that normal defenses of the body begin to attack the body. In this case, the immune system begins to attack cells located at the junctions of the muscles and the nerves. Women are affected more often. Women are affected at a younger age than men. Babies born to affected women frequently develop symptoms at an early age. SYMPTOMS Initially in the disease, the facial muscles are affected first. After this, a person may develop droopy eyelids. They may have difficulty controlling facial muscles. They may have problems chewing. Swallowing and speaking may become impaired. The weakness gradually spreads to the arms and legs. It begins to affect breathing. Sometimes, the symptoms lessen or go away without any apparent cause. DIAGNOSIS  Diagnosis can be made with blood tests. Tests such as electromyography may be done to examine the electrical activity in the muscle. An improvement in symptoms after having an anti-cholinesterase drug helps confirm the diagnosis.  TREATMENT  Medicines are usually prescribed as the first treatment. These medicines help, but they do not cure the disease. A plasma cleansing procedure (plasmapheresis) can be used to treat a crisis. It can also be used to prepare a person for surgery. This procedure produces short-term improvement. Some cases are helped by removing the thymus gland. Steroids are used for short-term benefits. Document Released: 12/08/2000 Document Revised: 11/24/2011 Document Reviewed: 09/01/2005 [facility] Patient  Information 2015 [place], [place]. This information is not intended to replace advice given to you by your health care provider. Make sure you discuss any questions you have with your health care provider.

## 2014-03-23 ENCOUNTER — Telehealth: Payer: Self-pay | Admitting: Neurology

## 2014-03-23 LAB — CBC WITH DIFFERENTIAL
Basophils Absolute: 0 10*3/uL (ref 0.0–0.2)
Basos: 0 %
Eos: 1 %
Eosinophils Absolute: 0.1 10*3/uL (ref 0.0–0.4)
HCT: 44.7 % (ref 37.5–51.0)
Hemoglobin: 14.8 g/dL (ref 12.6–17.7)
Immature Grans (Abs): 0 10*3/uL (ref 0.0–0.1)
Immature Granulocytes: 0 %
Lymphocytes Absolute: 1 10*3/uL (ref 0.7–3.1)
Lymphs: 12 %
MCH: 30.1 pg (ref 26.6–33.0)
MCHC: 33.1 g/dL (ref 31.5–35.7)
MCV: 91 fL (ref 79–97)
Monocytes Absolute: 0.8 10*3/uL (ref 0.1–0.9)
Monocytes: 10 %
Neutrophils Absolute: 6.2 10*3/uL (ref 1.4–7.0)
Neutrophils Relative %: 77 %
Platelets: 253 10*3/uL (ref 150–379)
RBC: 4.92 x10E6/uL (ref 4.14–5.80)
RDW: 14.1 % (ref 12.3–15.4)
WBC: 8.1 10*3/uL (ref 3.4–10.8)

## 2014-03-23 LAB — COMPREHENSIVE METABOLIC PANEL
ALT: 17 IU/L (ref 0–44)
AST: 20 IU/L (ref 0–40)
Albumin/Globulin Ratio: 2.1 (ref 1.1–2.5)
Albumin: 3.9 g/dL (ref 3.5–4.7)
Alkaline Phosphatase: 33 IU/L — ABNORMAL LOW (ref 39–117)
BUN/Creatinine Ratio: 12 (ref 10–22)
BUN: 12 mg/dL (ref 8–27)
CO2: 28 mmol/L (ref 18–29)
Calcium: 9.5 mg/dL (ref 8.6–10.2)
Chloride: 99 mmol/L (ref 97–108)
Creatinine, Ser: 0.99 mg/dL (ref 0.76–1.27)
GFR calc Af Amer: 81 mL/min/{1.73_m2} (ref >59)
GFR calc non Af Amer: 70 mL/min/{1.73_m2} (ref >59)
Globulin, Total: 1.9 g/dL (ref 1.5–4.5)
Glucose: 88 mg/dL (ref 65–99)
Potassium: 3.2 mmol/L — ABNORMAL LOW (ref 3.5–5.2)
Sodium: 144 mmol/L (ref 134–144)
Total Bilirubin: 1.4 mg/dL — ABNORMAL HIGH (ref 0.0–1.2)
Total Protein: 5.8 g/dL — ABNORMAL LOW (ref 6.0–8.5)

## 2014-03-23 LAB — PSA: PSA: 1.1 ng/mL (ref 0.0–4.0)

## 2014-03-23 MED ORDER — POTASSIUM CHLORIDE ER 10 MEQ PO TBCR: 10.0000 meq | EXTENDED_RELEASE_TABLET | Freq: Two times a day (BID) | ORAL | Status: DC

## 2014-03-23 NOTE — Telephone Encounter (Signed)
I called patient. The potassium remains low at 3.2. I will call and potassium supplementation for him. The PSA is 1.1, and within normal range.

## 2014-04-21 IMAGING — CR DG CHEST 1V PORT
1 series · 1 of 1 positions shown · non-contrast
Comparison: Chest x-ray 02/14/2013.

CLINICAL DATA: Respiratory distress.

PORTABLE CHEST - 1 VIEW

[AP]
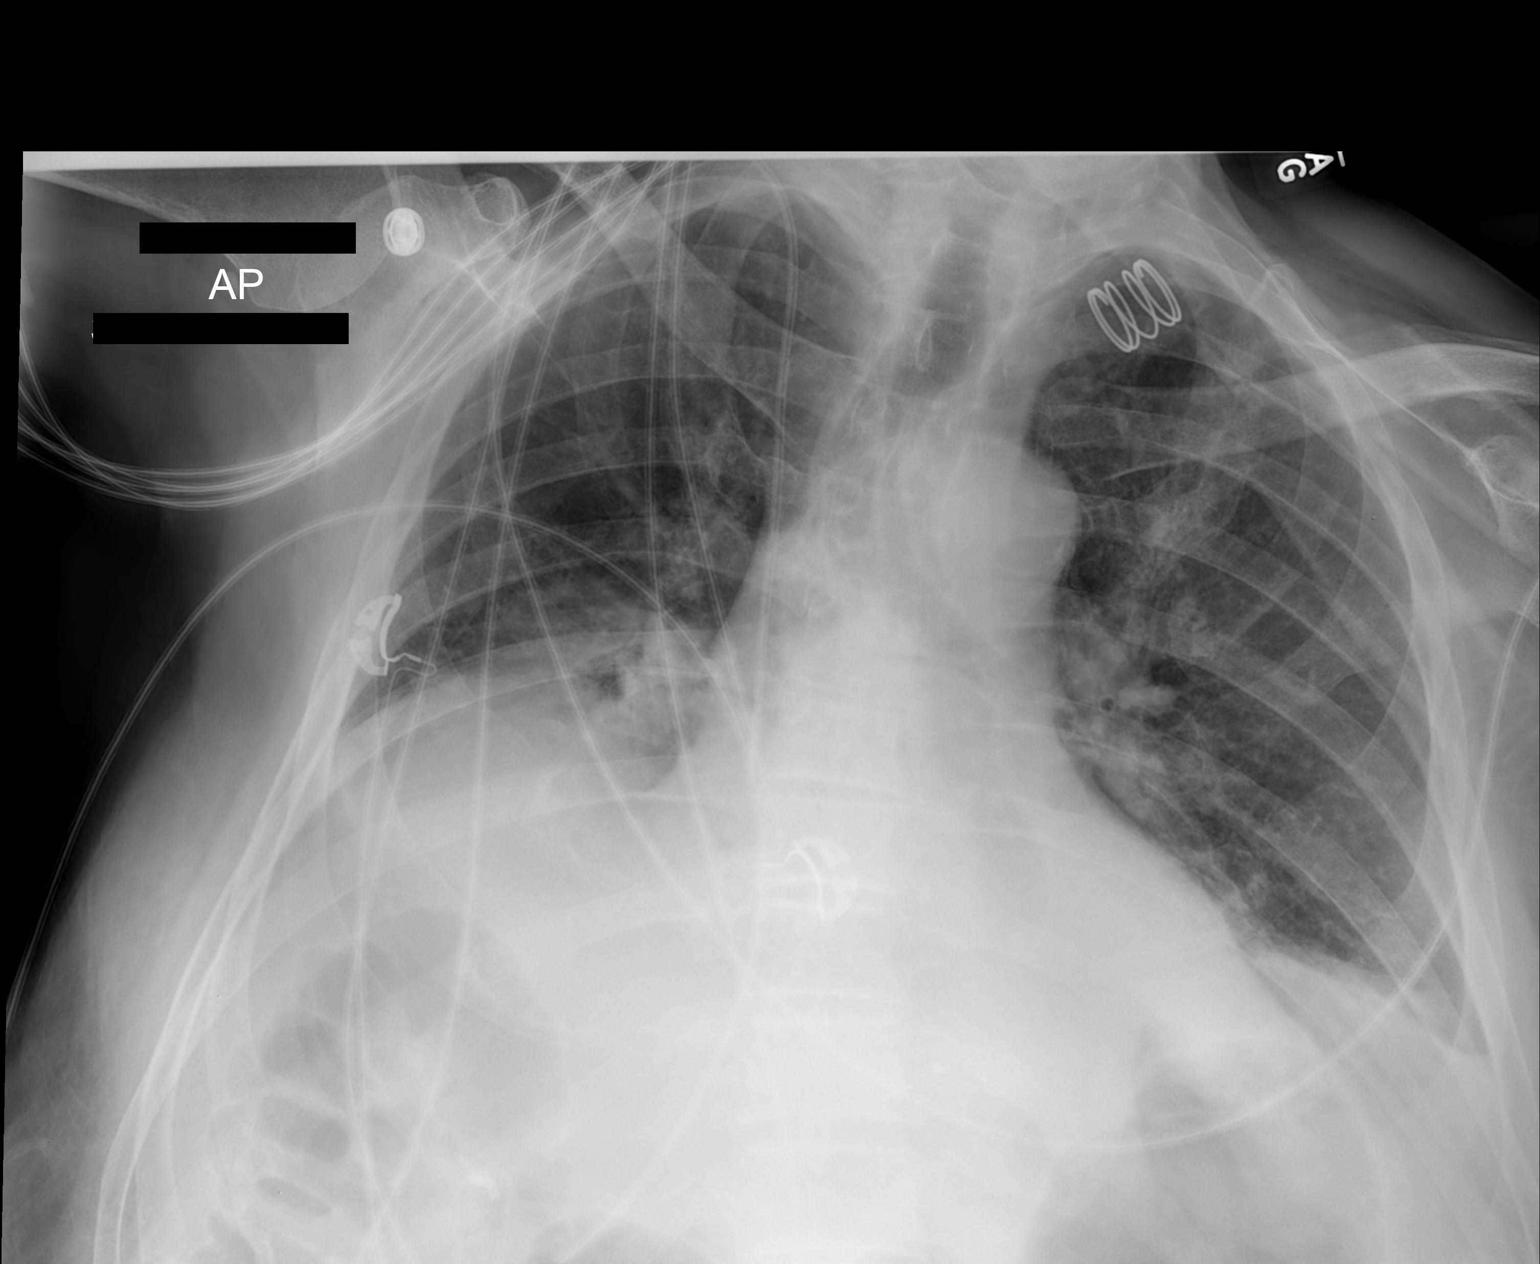

[1 of 1 positions shown; findings below may reference images not displayed]

FINDINGS: There is a right-sided internal jugular central venous
catheter with tip terminating in the superior cavoatrial junction
and. Lung volumes are low.  Bibasilar opacities may reflect areas
of atelectasis and/or consolidation.  Possible trace bilateral
pleural effusions.  No evidence of pulmonary edema.  Heart size is
within normal limits.  Atherosclerosis of the thoracic aorta. The
patient is rotated to the left on today's exam, resulting in
distortion of the mediastinal contours and reduced diagnostic
sensitivity and specificity for mediastinal pathology.
IMPRESSION: 1.  Support apparatus, as above.
2.  Low lung volumes with bibasilar atelectasis and/or
consolidation and probable trace bilateral pleural effusions.
3.  Atherosclerosis.

## 2014-05-29 IMAGING — CT CT CHEST W/O CM
3 of 4 series · 15 of 30 positions shown, 16 images · non-contrast
Comparison: Chest radiographs 02/16/2013 and earlier.  Chest CT
report 09/20/2002 (no images available).

CLINICAL DATA: 84-year-old male with myasthenia gravis.  Dysphagia.
Aspiration.  Feeding tube.  Query mediastinal mass/thymoma.

CT CHEST WITHOUT CONTRAST
TECHNIQUE: Multidetector CT imaging of the chest was performed
following the standard protocol without IV contrast.

[Series 3: chest w/o · axial · non-contrast · 0.82mm/px · z∈[-281,-86]mm · 4 of 65 slices shown, 5 images]
[im 13/65  mediastinal]
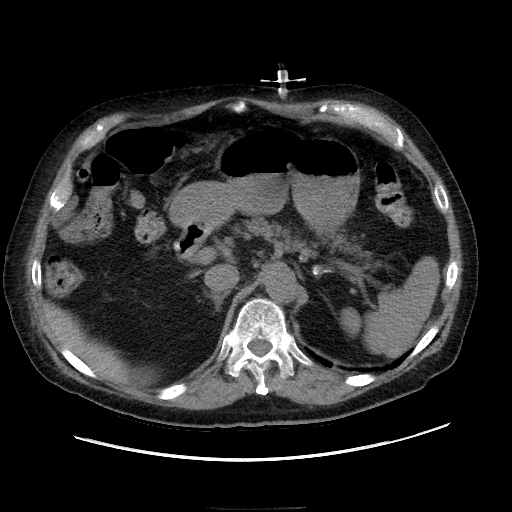
[im 13/65  lung]
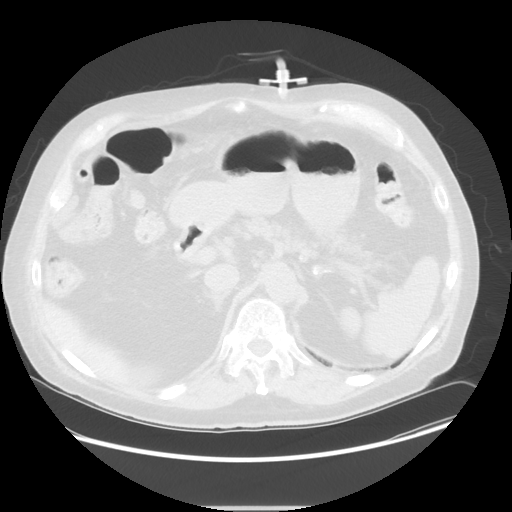
[im 26/65  lung]
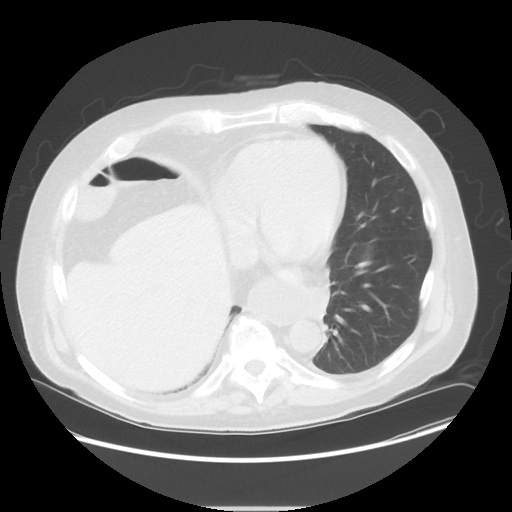
[im 39/65  lung]
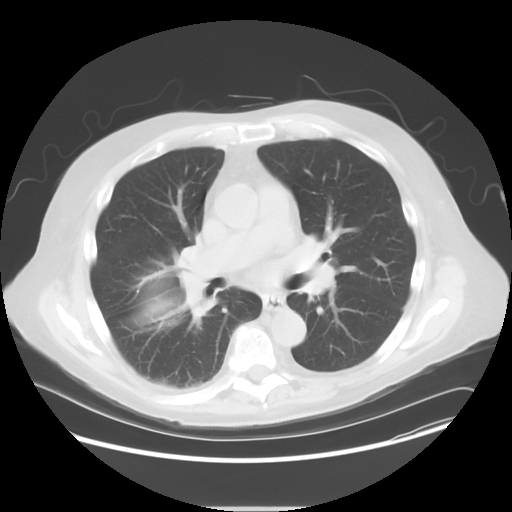
[im 52/65  lung]
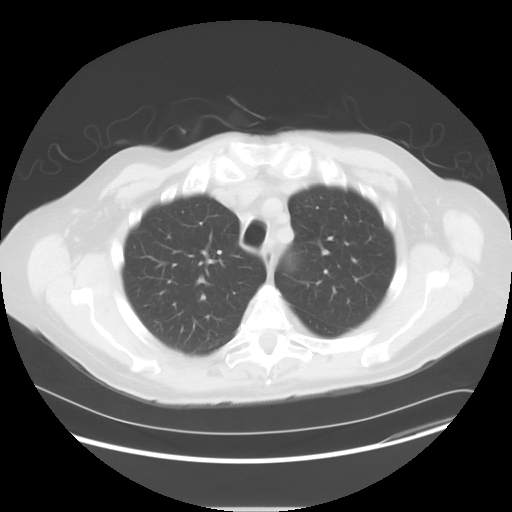

[Series 4: lung windows · axial · 0.82mm/px · z∈[-206,-86]mm · 3 of 50 slices shown]
[im 13/50  lung]
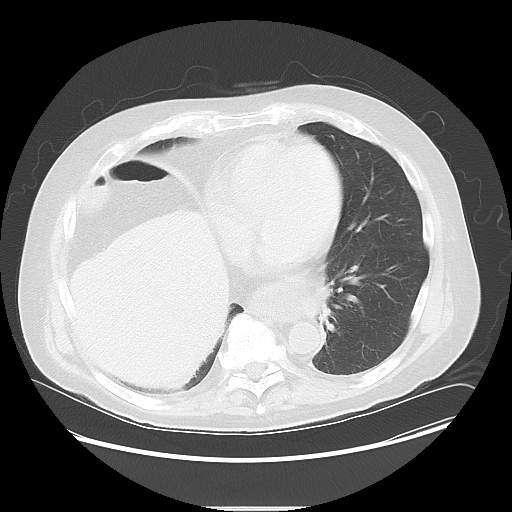
[im 25/50  lung]
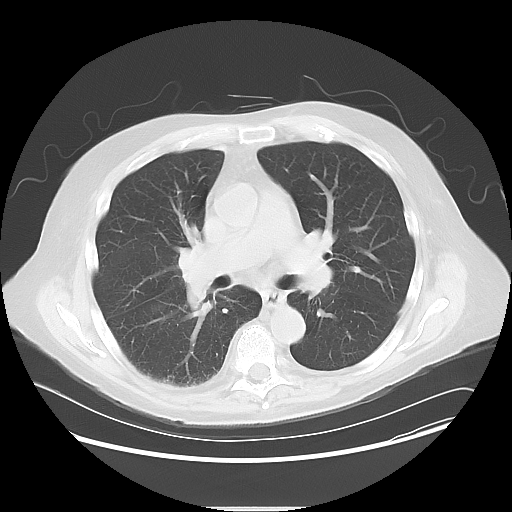
[im 37/50  lung]
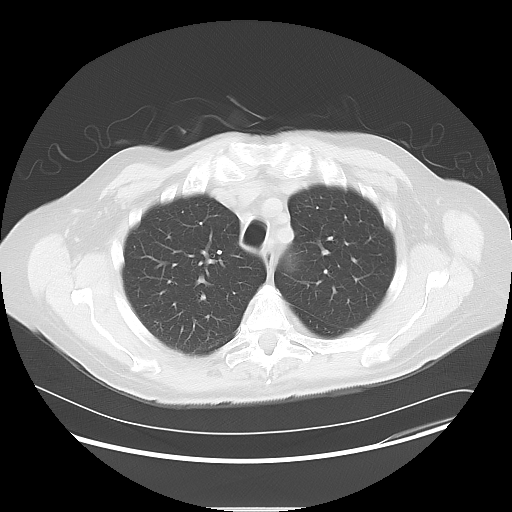

[Series 602: sagittal body · sagittal · 0.82mm/px · 8 of 169 slices shown]
[im 11/169  mediastinal]
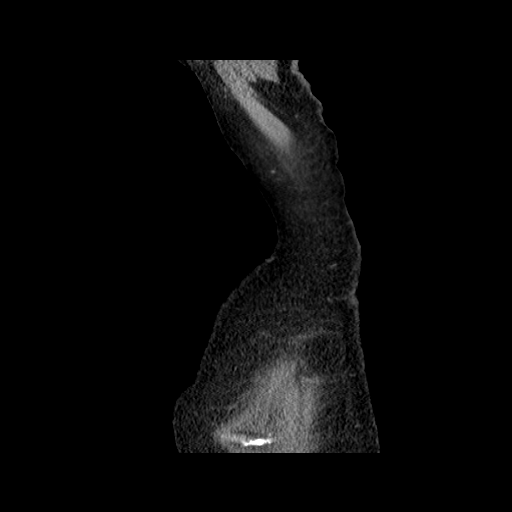
[im 32/169  mediastinal]
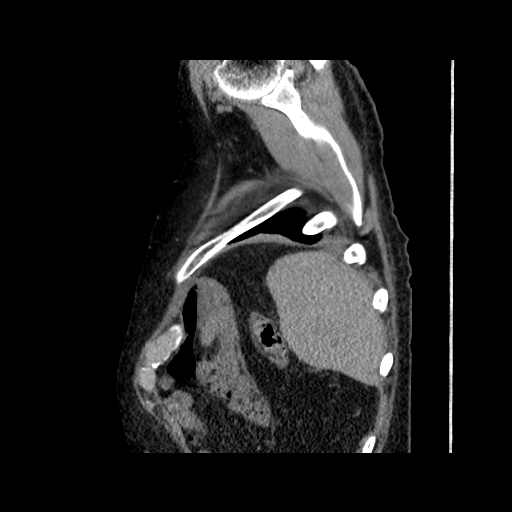
[im 53/169  mediastinal]
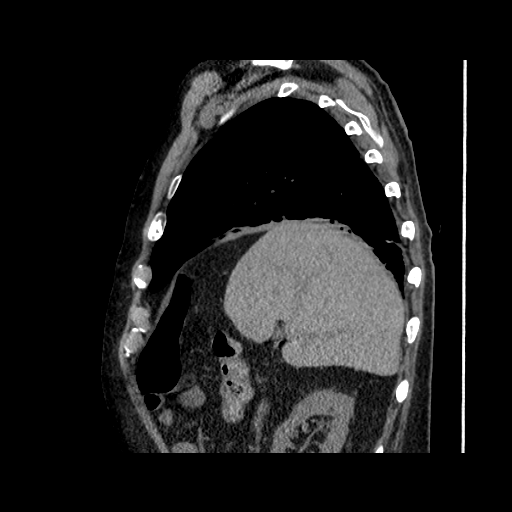
[im 74/169  mediastinal]
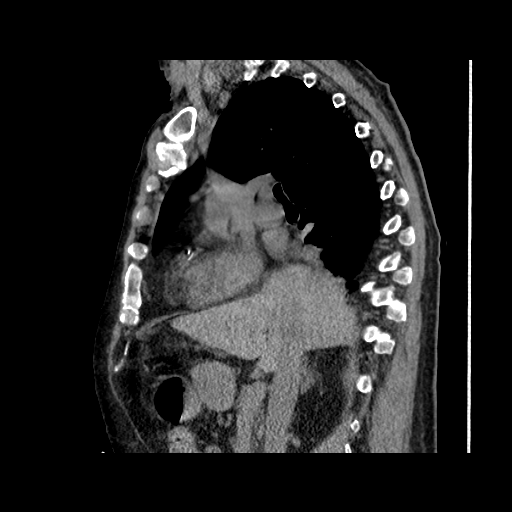
[im 95/169  mediastinal]
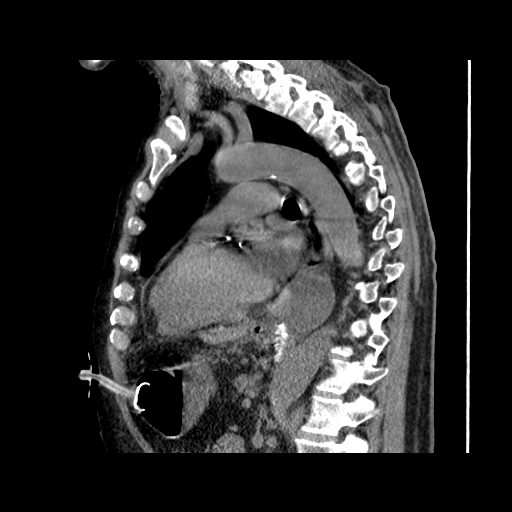
[im 116/169  mediastinal]
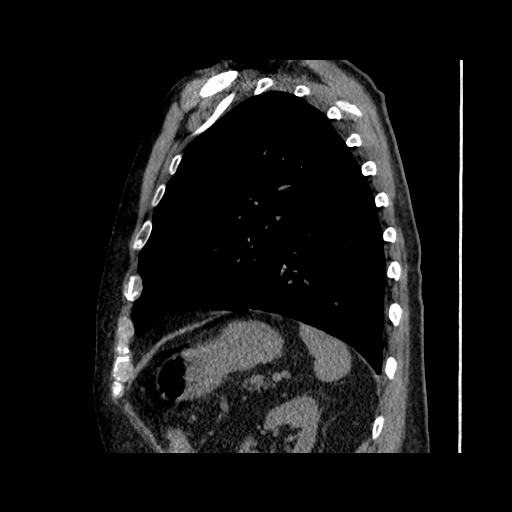
[im 137/169  mediastinal]
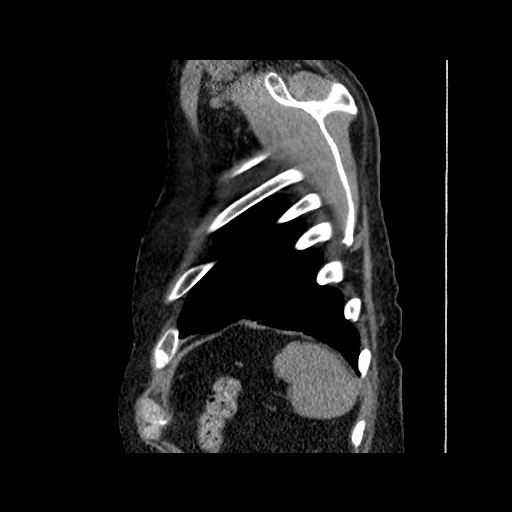
[im 158/169  mediastinal]
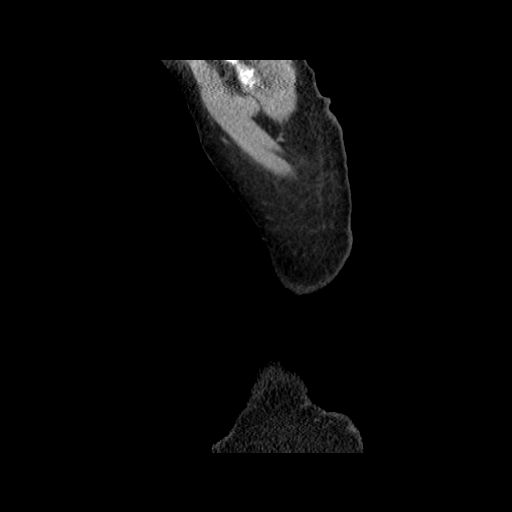

[15 of 30 positions shown; findings below may reference images not displayed]

FINDINGS: Negative noncontrast thoracic inlet.  No mediastinal mass
or lymphadenopathy.  No residual thymus tissue identified.

Coronary artery calcified atherosclerosis.  Mild aortic calcified
atherosclerosis.

No pericardial or pleural effusion.  There is elevation of the
right hemidiaphragm.  The major airways are patent.  There is
atelectasis at the right lung base.  There is minor atelectasis at
the left medial lung base.  Elsewhere lung parenchyma is within
normal limits.

Noncontrast upper abdominal viscera remarkable for postoperative
changes at the gallbladder fossa and gastroesophageal junction.
There is a percutaneous gastrostomy tube also in place.  There is a
fluid filled structure just above the esophageal hiatus which might
represent a fluid filled hernia.  Gastric hernia was described in
2665.  This has simple fluid attenuation measuring 38 x 52 x 42 mm.
It does seem to be a.  This structure does seem to be inseparable
from the lower thoracic esophagus.

Exaggerated thoracic kyphosis. No acute osseous abnormality
identified.
IMPRESSION: 1.  No mediastinal mass or thymoma.
2.  There is a 5 cm fluid filled structure in the lower posterior
mediastinum near the esophageal hiatus which seems to be
inseparable from the lower thoracic esophagus.  A gastric hernia
was described on the 2665 study and there are postoperative changes
at the gastric cardia.
Therefore, the top differential considerations for this cystic
structure are: fluid filled gastric / para esophageal hernia, a
postoperative seroma, or less likely fluid distention of the distal
esophagus or duplication cyst.
More proximal esophagus is not dilated (e.g. does not show evidence
of chronic distal esophageal obstruction), but if the patient's
dysphagia could be related to this finding then a barium esophagram
may evaluate further.
3.  Elevated right hemidiaphragm with right lung base atelectasis,
such as related to right phrenic nerve dysfunction.

## 2014-06-15 ENCOUNTER — Other Ambulatory Visit: Payer: Self-pay | Admitting: Neurology

## 2014-06-15 NOTE — Telephone Encounter (Signed)
Per note 12/10

## 2014-07-25 ENCOUNTER — Encounter: Payer: Self-pay | Admitting: Neurology

## 2014-07-25 ENCOUNTER — Ambulatory Visit (INDEPENDENT_AMBULATORY_CARE_PROVIDER_SITE_OTHER): Payer: Medicare Other | Admitting: Neurology

## 2014-07-25 VITALS — BP 136/89 | HR 106 | Ht 70.0 in | Wt 213.2 lb

## 2014-07-25 DIAGNOSIS — Z5181 Encounter for therapeutic drug level monitoring: Secondary | ICD-10-CM

## 2014-07-25 DIAGNOSIS — G7000 Myasthenia gravis without (acute) exacerbation: Secondary | ICD-10-CM

## 2014-07-25 NOTE — Progress Notes (Signed)
Reason for visit: myasthenia gravis  Victor Sutton is an 80 y.o. male  History of present illness:  Victor Sutton is an 80 year old right-handed white male with a history of myasthenia gravis with pharyngeal features. The patient has done quite well with the treatment of prednisone and CellCept. The patient is swallowing well, he has never had any issues with double vision or ptosis. He does not fatigue with chewing. He has had occasional muscular cramps involving the left flank and the groin area. The patient returns to this office for further evaluation. He has not had any significant new medical issues that have come up since last seen. He is tolerating the medications well.  Past Medical History  Diagnosis Date  . BPH (benign prostatic hyperplasia)   . Macular degeneration     (L) wet, (R) dry  . B12 deficiency     HIstory  . 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 Leukoplakia 2001    Treated for  . Hypertension   . Iron deficiency anemia   . Cataract 2009    bilateral cataract surgery  . GERD (gastroesophageal reflux disease)   . Hyperlipidemia     not on medication  . Ulcer   . Bell's palsy     at present  . Shortness of breath   . Peripheral neuropathy     Mild History of  . Hiatal hernia     hiatial hernia repair  02/08/13  . Cancer   . Dysphonia 03/16/2013  . Dysphagia, pharyngoesophageal phase 03/16/2013  . Myasthenia gravis 03/21/2013  . Disturbance of salivary secretion 06/20/2013    Past Surgical History  Procedure Laterality Date  . Cholecystectomy    . Arthroscopic knee surgery      left  . Multiple oral surgeries  2002  . Tonsillectomy    . Cateract surgeries       bilateral  . Stomach surgery  2005    states his stomach had moved up toward his chest , some type of surgery performed  . Hernia repair      Laparoscopic ventral  . Hiatal hernia repair  2004  . Skin cancer excision  10/2012    right leg-shin area  . Hiatal hernia repair N/A 02/08/2013   Procedure:  REPAIR OFCURRENT HIATAL HERNIA, ;  Surgeon: Odis Hollingshead, MD;  Location: [facility];  Service: General;  Laterality: N/A;  . Upper gi endoscopy N/A 02/08/2013    Procedure: UPPER GI ENDOSCOPY;  Surgeon: Odis Hollingshead, MD;  Location: [facility];  Service: General;  Laterality: N/A;  . Laparoscopic lysis of adhesions N/A 02/08/2013    Procedure: LAPAROSCOPIC LYSIS OF ADHESIONS;  Surgeon: Odis Hollingshead, MD;  Location: [facility];  Service: General;  Laterality: N/A;  . Laparoscopic nissen fundoplication  10/25/4707    Procedure: LAPAROSCOPIC NISSEN FUNDOPLICATION;  Surgeon: Odis Hollingshead, MD;  Location: [facility];  Service: General;;  . Gastrostomy w/ feeding tube  [id number]    Family History  Problem Relation Age of Onset  . Prostate cancer Paternal Grandfather   . Breast cancer Sister   . CAD Sister   . COPD Sister   . Heart attack Father   . Cancer Son 24    colorectal cancer    Social history:  reports that he quit smoking about 37 years ago. He has never used smokeless tobacco. He reports that he drinks alcohol. He reports that he does not use illicit drugs.   No  Known Allergies  Medications:  Current Outpatient Prescriptions on File Prior to Visit  Medication Sig Dispense Refill  . hydrochlorothiazide (HYDRODIURIL) 25 MG tablet TAKE 1 TABLET BY MOUTH DAILY. 90 tablet 1  . mycophenolate (CELLCEPT) 250 MG capsule Take 500 mg by mouth 2 (two) times daily.    . pantoprazole (PROTONIX) 40 MG tablet Take 40 mg by mouth every morning.     . potassium chloride (KLOR-CON 10) 10 MEQ tablet Take 1 tablet (10 mEq total) by mouth 2 (two) times daily. 60 tablet 5  . predniSONE (DELTASONE) 10 MG tablet Take 10 mg by mouth daily with breakfast.     . predniSONE (DELTASONE) 5 MG tablet Take as directed 90 tablet 3  . Vitamin D, Ergocalciferol, (DRISDOL) 50000 UNITS CAPS capsule Take 50,000 Units by mouth every 7 (seven) days. ON FRIDAYS    . zoledronic acid (RECLAST) 5 MG/100ML SOLN  injection Inject 5 mg into the vein once.     Current Facility-Administered Medications on File Prior to Visit  Medication Dose Route Frequency Provider Last Rate Last Dose  . botulinum toxin Type A (BOTOX) injection 100 Units  100 Units Intramuscular Once Hulen Luster, DO        ROS:  Out of a complete 14 system review of symptoms, the patient complains only of the following symptoms, and all other reviewed systems are negative.  Fatigue Cold intolerance Leg swelling Snoring Joint swelling Bruising easily Weakness  Blood pressure 136/89, pulse 106, height 5\' 10"  (1.778 m), weight 213 lb 3.2 oz (96.707 kg).  Physical Exam  General: The patient is alert and cooperative at the time of the examination.  Skin: 1+ edema below the knees is seen bilaterally   Neurologic Exam  Mental status: The patient is oriented x 3.  Cranial nerves: Facial symmetry is present. Speech is normal, no aphasia or dysarthria is noted. Extraocular movements are full. Visual fields are full. With superior gaze for 1 minute, no ptosis or divergence of gaze is noted. The patient has good strength with jaw opening and closure. Good facial strength is seen.  Motor: The patient has good strength in all 4 extremities. With the arms outstretched 1 minute, the patient has no fatigable weakness of the deltoid muscles.  Sensory examination: Soft touch sensation is symmetric on the face, arms, and legs.  Coordination: The patient has good finger-nose-finger and heel-to-shin bilaterally.  Gait and station: The patient has a normal gait. Tandem gait is unsteady. Romberg is negative. No drift is seen.  Reflexes: Deep tendon reflexes are symmetric.   Assessment/Plan:  1. Myasthenia gravis  The patient doing well currently with his medical therapy. The patient will reduce the prednisone to 12.5 mg daily for 6 weeks, then go to 10 mg daily. The patient will follow-up in about 4 months. He will have blood  work done today.  Jill Alexanders MD 07/25/2014 6:26 PM  [facility] [address] [place], [place] [address]  Phone [phone] Fax [phone]

## 2014-07-25 NOTE — Patient Instructions (Signed)
Myasthenia Gravis Myasthenia gravis is a disease that causes muscle weakness throughout the body. The muscles affected are the ones we can control (voluntary muscles). An example of a voluntary muscle is your hand muscles. You can control the muscles to make the hand pick something up. An example of an involuntary muscle is the heart. The heart beats without any direction from you.  Myasthenia Gravis is thought to be an autoimmune disease. That means that normal defenses of the body begin to attack the body. In this case, the immune system begins to attack cells located at the junctions of the muscles and the nerves. Women are affected more often. Women are affected at a younger age than men. Babies born to affected women frequently develop symptoms at an early age. SYMPTOMS Initially in the disease, the facial muscles are affected first. After this, a person may develop droopy eyelids. They may have difficulty controlling facial muscles. They may have problems chewing. Swallowing and speaking may become impaired. The weakness gradually spreads to the arms and legs. It begins to affect breathing. Sometimes, the symptoms lessen or go away without any apparent cause. DIAGNOSIS  Diagnosis can be made with blood tests. Tests such as electromyography may be done to examine the electrical activity in the muscle. An improvement in symptoms after having an anti-cholinesterase drug helps confirm the diagnosis.  TREATMENT  Medicines are usually prescribed as the first treatment. These medicines help, but they do not cure the disease. A plasma cleansing procedure (plasmapheresis) can be used to treat a crisis. It can also be used to prepare a person for surgery. This procedure produces short-term improvement. Some cases are helped by removing the thymus gland. Steroids are used for short-term benefits. Document Released: 12/08/2000 Document Revised: 11/24/2011 Document Reviewed: 11/02/2013 [employer] Patient  Information 2015 [employer]. This information is not intended to replace advice given to you by your health care provider. Make sure you discuss any questions you have with your health care provider.  

## 2014-07-26 ENCOUNTER — Other Ambulatory Visit: Payer: Self-pay | Admitting: Dermatology

## 2014-07-26 LAB — COMPREHENSIVE METABOLIC PANEL
ALT: 13 IU/L (ref 0–44)
AST: 24 IU/L (ref 0–40)
Albumin/Globulin Ratio: 1.9 (ref 1.1–2.5)
Albumin: 4.1 g/dL (ref 3.5–4.7)
Alkaline Phosphatase: 36 IU/L — ABNORMAL LOW (ref 39–117)
BUN/Creatinine Ratio: 14 (ref 10–22)
BUN: 14 mg/dL (ref 8–27)
CO2: 23 mmol/L (ref 18–29)
Calcium: 9.2 mg/dL (ref 8.6–10.2)
Chloride: 100 mmol/L (ref 97–108)
Creatinine, Ser: 1.02 mg/dL (ref 0.76–1.27)
GFR calc Af Amer: 78 mL/min/{1.73_m2} (ref >59)
GFR calc non Af Amer: 67 mL/min/{1.73_m2} (ref >59)
Globulin, Total: 2.2 g/dL (ref 1.5–4.5)
Glucose: 101 mg/dL — ABNORMAL HIGH (ref 65–99)
Potassium: 3.8 mmol/L (ref 3.5–5.2)
Sodium: 141 mmol/L (ref 134–144)
Total Bilirubin: 1.4 mg/dL — ABNORMAL HIGH (ref 0.0–1.2)
Total Protein: 6.3 g/dL (ref 6.0–8.5)

## 2014-07-26 LAB — CBC WITH DIFFERENTIAL
Basophils Absolute: 0 10*3/uL (ref 0.0–0.2)
Basos: 0 %
Eos: 0 %
Eosinophils Absolute: 0 10*3/uL (ref 0.0–0.4)
HCT: 45.1 % (ref 37.5–51.0)
Hemoglobin: 15.4 g/dL (ref 12.6–17.7)
Immature Grans (Abs): 0 10*3/uL (ref 0.0–0.1)
Immature Granulocytes: 0 %
Lymphocytes Absolute: 0.7 10*3/uL (ref 0.7–3.1)
Lymphs: 8 %
MCH: 29.2 pg (ref 26.6–33.0)
MCHC: 34.1 g/dL (ref 31.5–35.7)
MCV: 85 fL (ref 79–97)
Monocytes Absolute: 0.6 10*3/uL (ref 0.1–0.9)
Monocytes: 7 %
Neutrophils Absolute: 7.2 10*3/uL — ABNORMAL HIGH (ref 1.4–7.0)
Neutrophils Relative %: 85 %
Platelets: 267 10*3/uL (ref 150–379)
RBC: 5.28 x10E6/uL (ref 4.14–5.80)
RDW: 14.9 % (ref 12.3–15.4)
WBC: 8.5 10*3/uL (ref 3.4–10.8)

## 2014-08-18 ENCOUNTER — Other Ambulatory Visit: Payer: Self-pay | Admitting: Neurology

## 2014-09-18 ENCOUNTER — Other Ambulatory Visit: Payer: Self-pay | Admitting: Neurology

## 2014-09-19 ENCOUNTER — Other Ambulatory Visit (HOSPITAL_COMMUNITY): Payer: Self-pay | Admitting: Internal Medicine

## 2014-09-19 DIAGNOSIS — R140 Abdominal distension (gaseous): Secondary | ICD-10-CM

## 2014-09-25 ENCOUNTER — Ambulatory Visit (HOSPITAL_COMMUNITY)
Admission: RE | Admit: 2014-09-25 | Discharge: 2014-09-25 | Disposition: A | Discharge status: Home / Self Care | Payer: Medicare Other | Source: Ambulatory Visit | Attending: Internal Medicine | Admitting: Internal Medicine

## 2014-09-25 DIAGNOSIS — R140 Abdominal distension (gaseous): Secondary | ICD-10-CM

## 2014-09-25 DIAGNOSIS — K30 Functional dyspepsia: Secondary | ICD-10-CM | POA: Diagnosis not present

## 2014-09-25 MED ORDER — TECHNETIUM TC 99M SULFUR COLLOID
2.0000 | Freq: Once | INTRAVENOUS | Status: AC | PRN
  Administered 2014-09-25: 2 via ORAL

## 2014-09-26 ENCOUNTER — Encounter: Payer: Self-pay | Admitting: Neurology

## 2014-09-27 ENCOUNTER — Encounter: Payer: Self-pay | Admitting: *Deleted

## 2014-09-28 ENCOUNTER — Ambulatory Visit (INDEPENDENT_AMBULATORY_CARE_PROVIDER_SITE_OTHER): Payer: Medicare Other | Admitting: Internal Medicine

## 2014-09-28 ENCOUNTER — Encounter: Payer: Self-pay | Admitting: Internal Medicine

## 2014-09-28 VITALS — BP 136/80 | HR 104 | Ht 70.0 in | Wt 205.2 lb

## 2014-09-28 DIAGNOSIS — R140 Abdominal distension (gaseous): Secondary | ICD-10-CM

## 2014-09-28 DIAGNOSIS — Z09 Encounter for follow-up examination after completed treatment for conditions other than malignant neoplasm: Secondary | ICD-10-CM

## 2014-09-28 DIAGNOSIS — Z9889 Other specified postprocedural states: Secondary | ICD-10-CM

## 2014-09-28 DIAGNOSIS — K3184 Gastroparesis: Secondary | ICD-10-CM

## 2014-09-28 DIAGNOSIS — G7000 Myasthenia gravis without (acute) exacerbation: Secondary | ICD-10-CM

## 2014-09-28 DIAGNOSIS — R6881 Early satiety: Secondary | ICD-10-CM

## 2014-09-28 NOTE — Patient Instructions (Addendum)
Please continue your probiotic Align  Please continue gastroparesis diet for 2 weeks and call our office with an update on your condition at [phone].  Dr. Hilarie Fredrickson will contact your neurologist to see if its ok to start you on Reglan  Please make follow up appointment in 8 weeks.  Cc: Marton Redwood CC: Margette Fast

## 2014-09-28 NOTE — Progress Notes (Signed)
Subjective:    Patient ID: Victor Sutton, male    DOB: [birth year], 81 y.o.   MRN: [id number]  HPI Mr. 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 Zella Richer, subsequent development of myasthenia gravis requiring PEG feedings for many months, now status post PEG removal, who is seen in follow-up at the request of Dr. Lang Snow. He has recently had a positive gastric imaging study suggesting gastroparesis. He is here today with his wife  He states that over the last few months and maybe more like a year he has noticed tightness in his upper abdomen after eating which can be uncomfortable. He has early satiety and frequent abdominal bloating with belching and lower GI gas. Fortunately he denies nausea and vomiting. Again he had a PEG tube which was removed approximately 10 months ago and he has been eating well without dysphagia or odynophagia. Gastric imaging study was performed recently which suggested delayed gastric emptying. He reports bowel movements have been regular without blood in his stool or melena. He is using align as a probiotic only for the past week after suggested by Dr. Brigitte Pulse for gas and bloating. He thinks it has helped somewhat lower intestinal gas. He is using pantoprazole 40 mg daily. He has lost about 10-15 pounds but he contributes this to decreasing doses of prednisone.  He is seeing Dr. Jannifer Franklin with neurology for his myasthenia gravis. He was initially treated with high-dose prednisone and this is been weaned down to 10 mg daily and he is taking CellCept 500 mg twice daily. Myasthenia gravis has been recently well controlled.  Anemia has resolved and recent labs reveal normal CBC and metabolic panel  Review of Systems As per history of present illness, otherwise negative  Current Medications, Allergies, Past Medical  History, Past Surgical History, Family History and Social History were reviewed in [employer] record.     Objective:   Physical Exam BP 136/80 mmHg  Pulse 104  Ht 5\' 10"  (1.778 m)  Wt 205 lb 4 oz (93.101 kg)  BMI 29.45 kg/m2 Constitutional: Well-developed and well-nourished. No distress. HEENT: Normocephalic and atraumatic. Oropharynx is clear and moist. No oropharyngeal exudate. Conjunctivae are normal.  No scleral icterus. Neck: Neck supple. Trachea midline. Cardiovascular: Normal rate, regular rhythm and intact distal pulses. No M/R/G Pulmonary/chest: Effort normal and breath sounds normal. No wheezing, rales or rhonchi. Abdominal: Soft, nontender, nondistended. Bowel sounds active throughout. PEG scar midline and well healed Extremities: no clubbing, cyanosis, or edema Neurological: Alert and oriented to person place and time. Skin: Skin is warm and dry. No rashes noted. Psychiatric: Normal mood and affect. Behavior is normal.  Labs 09/12/2014 -- CMP within normal limits, WBC 6.0, hemoglobin 15.7, MCV 92.8, platelet 361  CLINICAL DATA:  Evaluate for kidney stone.   EXAM: CT ABDOMEN AND PELVIS WITHOUT CONTRAST   TECHNIQUE: Multidetector CT imaging of the abdomen and pelvis was performed following the standard protocol without IV contrast.   COMPARISON:  None.   FINDINGS: Atherosclerotic disease involves the LAD, left circumflex and RCA coronary arteries. The lung bases are clear. There is no focal liver abnormality. Previous cholecystectomy. No biliary dilatation. Normal appearance of the pancreas. The spleen is normal.   The adrenal glands both appear normal. The right kidney is normal. No right-sided nephrolithiasis or hydronephrosis. Normal appearance of the left kidney.  No left nephrolithiasis or obstructive uropathy. Urinary bladder appears normal for degree of distention.   Mild calcified atherosclerotic disease involves the abdominal  aorta. No aneurysm. There is no abdominal adenopathy. No pelvic or inguinal adenopathy. No free fluid or fluid collections within the abdomen or pelvis.   Patient is status post repair of hiatal hernia. The stomach is otherwise unremarkable in appearance. The small bowel loops are normal. The appendix is visualized and appears within normal limits. Normal appearance of the proximal colon. Multiple distal colonic diverticula identified without acute inflammation.   Previous mesh repair of ventral abdominal wall hernia noted. The mesh appears intact.   Review of the visualized bony structures is significant for mild spondylosis within the lumbar spine.   IMPRESSION: 1. No evidence for nephrolithiasis or obstructive uropathy. 2. Atherosclerotic disease including multi vessel coronary artery calcifications.     Electronically Signed   By: Kerby Moors M.D.   On: 02/11/2014 12:22 ________________________________________________________________________________________________________  CLINICAL DATA:  Bloating.   EXAM: NUCLEAR MEDICINE GASTRIC EMPTYING SCAN   TECHNIQUE: After oral ingestion of radiolabeled meal, sequential abdominal images were obtained for 120 minutes. Residual percentage of activity remaining within the stomach was calculated at 60 and 120 minutes.   RADIOPHARMACEUTICALS:  2.0 mCi Technetium 99-m labeled sulfur colloid   COMPARISON:  CT 02/28/2014.   FINDINGS: Expected location of the stomach in the left upper quadrant. Ingested meal empties the stomach gradually over the course of the study with 98% retention at 60 min and 38% retention at 120 min (normal retention less than 30% at a 120 min).   IMPRESSION: Delayed gastric emptying.     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 Zella Richer, subsequent development of myasthenia gravis requiring PEG feedings for many months, now status post PEG removal, who is seen in follow-up at the request of Dr. Lang Snow.  1. Abd bloating/early satiety/upper abdominal pressure -- his symptoms are consistent with gastroparesis. Etiology unclear as there is no evidence of diabetes or hyperglycemia despite his prednisone use. He has had gastric surgery and also a PEG tube which possibly could contribute. Question whether his neurologic condition may contribute as well. We discussed gastroparesis at length today and ideally we would treat this with dietary modification. He was given a copy of gastroparesis diet. Medical options include metoclopramide, but we had a long discussion regarding the potential for neurologic side effects such as tremor and rare but even tardive dyskinesia for long-term use. If we use this medication I would want it cleared by Dr. Jannifer Franklin first given his myasthenia gravis. Another option might be domperidone given the lack of neurologic side effects with this medication. Again any new medication is to be cleared with neurology first. He will begin gastroparesis diet and let me know in 2 weeks if his symptoms have improved. If not we will consider pharmacologic therapy with promotility agent pending neurology opinion. He will continue align as a probiotic daily. Follow-up in 8 weeks, sooner if necessary  2. Iron deficiency anemia -- resolved  3.  Hiatal hernia -- repaired, normal-appearing stomach by CT in May 2015  4. MG -- managed by Dr. Jannifer Franklin, currently on prednisone and MMF

## 2014-09-29 ENCOUNTER — Telehealth: Payer: Self-pay | Admitting: Internal Medicine

## 2014-09-29 NOTE — Telephone Encounter (Signed)
Patient and wife have been advised of Dr Hilarie Fredrickson and Dr Jannifer Franklin' conversation regarding Reglan. He verbalizes understanding.

## 2014-09-29 NOTE — Telephone Encounter (Signed)
-----   Message from Kathrynn Ducking, MD sent at 09/28/2014  7:15 PM EST ----- I got a message, I don't think there will be any problem using metoclopramide with this patient. Thank you for the note.

## 2014-09-29 NOTE — Telephone Encounter (Signed)
Adding this message from Dr. Jannifer Franklin to the chart for completeness regarding the use of Reglan in this patient with myasthenia gravis, should dietary measures fail

## 2014-10-04 ENCOUNTER — Encounter: Payer: Self-pay | Admitting: Internal Medicine

## 2014-10-16 ENCOUNTER — Telehealth: Payer: Self-pay | Admitting: Internal Medicine

## 2014-10-16 NOTE — Telephone Encounter (Signed)
Spoke with pts wife and she is aware. 

## 2014-10-16 NOTE — Telephone Encounter (Signed)
Wife states pt has done well on the phase 3 gastroparesis diet. Wife wants to know if they could add some vegetables to his diet like green beans, if tolerates this maybe add some other vegetables. Please advise.

## 2014-10-16 NOTE — Telephone Encounter (Signed)
Great news, yes he can add in soft and very cooked vegetables Update [place] if not doing well

## 2014-11-24 ENCOUNTER — Encounter: Payer: Self-pay | Admitting: Neurology

## 2014-11-24 ENCOUNTER — Ambulatory Visit (INDEPENDENT_AMBULATORY_CARE_PROVIDER_SITE_OTHER): Payer: Medicare Other | Admitting: Neurology

## 2014-11-24 VITALS — BP 127/69 | HR 76 | Ht 70.0 in | Wt 202.0 lb

## 2014-11-24 DIAGNOSIS — G7000 Myasthenia gravis without (acute) exacerbation: Secondary | ICD-10-CM

## 2014-11-24 DIAGNOSIS — Z5181 Encounter for therapeutic drug level monitoring: Secondary | ICD-10-CM | POA: Diagnosis not present

## 2014-11-24 MED ORDER — PREDNISONE 1 MG PO TABS: ORAL_TABLET | ORAL | Status: DC

## 2014-11-24 MED ORDER — PREDNISONE 5 MG PO TABS: 5.0000 mg | ORAL_TABLET | Freq: Every day | ORAL | Status: DC

## 2014-11-24 NOTE — Patient Instructions (Signed)
Myasthenia Gravis Myasthenia gravis is a disease that causes muscle weakness throughout the body. The muscles affected are the ones we can control (voluntary muscles). An example of a voluntary muscle is your hand muscles. You can control the muscles to make the hand pick something up. An example of an involuntary muscle is the heart. The heart beats without any direction from you.  Myasthenia Gravis is thought to be an autoimmune disease. That means that normal defenses of the body begin to attack the body. In this case, the immune system begins to attack cells located at the junctions of the muscles and the nerves. Women are affected more often. Women are affected at a younger age than men. Babies born to affected women frequently develop symptoms at an early age. SYMPTOMS Initially in the disease, the facial muscles are affected first. After this, a person may develop droopy eyelids. They may have difficulty controlling facial muscles. They may have problems chewing. Swallowing and speaking may become impaired. The weakness gradually spreads to the arms and legs. It begins to affect breathing. Sometimes, the symptoms lessen or go away without any apparent cause. DIAGNOSIS  Diagnosis can be made with blood tests. Tests such as electromyography may be done to examine the electrical activity in the muscle. An improvement in symptoms after having an anti-cholinesterase drug helps confirm the diagnosis.  TREATMENT  Medicines are usually prescribed as the first treatment. These medicines help, but they do not cure the disease. A plasma cleansing procedure (plasmapheresis) can be used to treat a crisis. It can also be used to prepare a person for surgery. This procedure produces short-term improvement. Some cases are helped by removing the thymus gland. Steroids are used for short-term benefits. Document Released: 12/08/2000 Document Revised: 11/24/2011 Document Reviewed: 11/02/2013 [employer] Patient  Information 2015 [employer]. This information is not intended to replace advice given to you by your health care provider. Make sure you discuss any questions you have with your health care provider.  

## 2014-11-24 NOTE — Progress Notes (Signed)
Reason for visit: Myasthenia gravis  Victor Sutton is an 81 y.o. male  History of present illness:  Victor Sutton is an 81 year old right-handed white male with a history of myasthenia gravis. The patient presented with primarily pharyngeal features. He has done quite well, he will occasionally have some ptosis of the right eye, he denies any double vision, he has never had double vision. He denies any issues with speech or swallowing or chewing. He has good days and bad days with fatigue, he has also been dealing with some issues with his stomach, with possible gastroparesis. He has also had issues with low back pain, but this fortunately has resolved. He was seen by Dr. Sherley Bounds previously. He returns for an evaluation. He is on 10 mg of prednisone, and he is on CellCept. He is tolerating the medications well. He has not noted any worsening of weakness coming down on the prednisone. The patient has had some mild gait instability. He has not had any falls. He has been on the 10 mg dose of prednisone for about 3 months.  Past Medical History  Diagnosis Date  . BPH (benign prostatic hyperplasia)   . Macular degeneration     (L) wet, (R) dry  . B12 deficiency     HIstory  . 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 Leukoplakia 2001    Treated for  . Hypertension   . Iron deficiency anemia   . Cataract 2009    bilateral cataract surgery  . GERD (gastroesophageal reflux disease)   . Hyperlipidemia     not on medication  . Ulcer   . Shortness of breath   . Peripheral neuropathy     Mild History of  . Hiatal hernia     hiatial hernia repair  02/08/13  . Cancer   . Dysphonia 03/16/2013  . Dysphagia, pharyngoesophageal phase 03/16/2013  . Myasthenia gravis 03/21/2013  . Disturbance of salivary secretion 06/20/2013  . Adenomatous colon polyp   . Delayed gastric emptying   . Cameron ulcer     Past Surgical History  Procedure Laterality Date  . Cholecystectomy    . Arthroscopic knee  surgery      left  . Multiple oral surgeries  2002  . Tonsillectomy    . Cateract surgeries       bilateral  . Stomach surgery  2005    states his stomach had moved up toward his chest , some type of surgery performed  . Hernia repair      Laparoscopic ventral  . Hiatal hernia repair  2004  . Skin cancer excision  10/2012    right leg-shin area  . Hiatal hernia repair N/A 02/08/2013    Procedure:  REPAIR OFCURRENT HIATAL HERNIA, ;  Surgeon: Odis Hollingshead, MD;  Location: [facility];  Service: General;  Laterality: N/A;  . Upper gi endoscopy N/A 02/08/2013    Procedure: UPPER GI ENDOSCOPY;  Surgeon: Odis Hollingshead, MD;  Location: [facility];  Service: General;  Laterality: N/A;  . Laparoscopic lysis of adhesions N/A 02/08/2013    Procedure: LAPAROSCOPIC LYSIS OF ADHESIONS;  Surgeon: Odis Hollingshead, MD;  Location: [facility];  Service: General;  Laterality: N/A;  . Laparoscopic nissen fundoplication  [id number]    Procedure: LAPAROSCOPIC NISSEN FUNDOPLICATION;  Surgeon: Odis Hollingshead, MD;  Location: [facility];  Service: General;;  . Gastrostomy w/ feeding tube  [id number]    Family History  Problem Relation Age of Onset  .  Prostate cancer Paternal Grandfather   . Breast cancer Sister   . CAD Sister   . COPD Sister   . Colon cancer Son 37  . Diabetes Father   . Heart attack Father   . Colon cancer Son 49    Social history:  reports that he quit smoking about 38 years ago. He has never used smokeless tobacco. He reports that he does not drink alcohol or use illicit drugs.    Allergies  Allergen Reactions  . Ciprofloxacin Anaphylaxis    Medications:  Prior to Admission medications   Medication Sig Start Date End Date Taking? Authorizing Provider  hydrochlorothiazide (HYDRODIURIL) 25 MG tablet TAKE 1 TABLET BY MOUTH DAILY. 06/15/14  Yes Kathrynn Ducking, MD  Multiple Vitamins-Minerals (ICAPS) CAPS Take 1 capsule by mouth 2 (two) times daily.   Yes Historical Provider, MD  mupirocin  ointment (BACTROBAN) 2 % Apply 1 application topically as needed.  09/19/14  Yes Historical Provider, MD  mycophenolate (CELLCEPT) 250 MG capsule TAKE 2 CAPSULES (500 MG TOTAL) BY MOUTH 2 TIMES DAILY. 08/18/14  Yes Kathrynn Ducking, MD  pantoprazole (PROTONIX) 40 MG tablet Take 40 mg by mouth 2 (two) times daily.  09/27/13  Yes Historical Provider, MD  potassium chloride (K-DUR) 10 MEQ tablet TAKE 1 TABLET BY MOUTH 2 TIMES DAILY. 09/18/14  Yes Kathrynn Ducking, MD  predniSONE (DELTASONE) 10 MG tablet Take 10 mg by mouth daily with breakfast.    Yes Historical Provider, MD  Probiotic Product (PROBIOTIC DAILY PO) Take 1 tablet by mouth daily. Pt takes Align   Yes Historical Provider, MD  Vitamin D, Ergocalciferol, (DRISDOL) 50000 UNITS CAPS capsule Take 50,000 Units by mouth every 7 (seven) days. ON FRIDAYS   Yes Historical Provider, MD  zoledronic acid (RECLAST) 5 MG/100ML SOLN injection Inject 5 mg into the vein once.   Yes Historical Provider, MD    ROS:  Out of a complete 14 system review of symptoms, the patient complains only of the following symptoms, and all other reviewed systems are negative.  Walking difficulties Bruising easily Weakness  Blood pressure 127/69, pulse 76, height 5\' 10"  (1.778 m), weight 202 lb (91.627 kg).  Physical Exam  General: The patient is alert and cooperative at the time of the examination.  Skin: 1-2+ edema below the knees is noted bilaterally.   Neurologic Exam  Mental status: The patient is alert and oriented x 3 at the time of the examination. The patient has apparent normal recent and remote memory, with an apparently normal attention span and concentration ability.   Cranial nerves: Facial symmetry is present. Speech is normal, no aphasia or dysarthria is noted. Extraocular movements are full. Visual fields are full. The patient has good strength with the facial muscles, and neck flexion and extension and rotation. With superior gaze for 1 minute, no  increased ptosis or divergence of gaze is noted. No subjective double vision was noted.  Motor: The patient has good strength in all 4 extremities. With arms outstretched 1 minute, no fatigable weakness of the deltoid muscles was noted.  Sensory examination: Soft touch sensation is symmetric on the face, arms, and legs.  Coordination: The patient has good finger-nose-finger and heel-to-shin bilaterally.  Gait and station: The patient has a normal gait. Tandem gait is normal. Romberg is negative. No drift is seen.  Reflexes: Deep tendon reflexes are symmetric.   Assessment/Plan:  1. Myasthenia gravis with pharyngeal features  2. History of chronic low back pain  The patient is doing quite well with his myasthenia. A prednisone taper will be continued. A prescription for the 5 mg prednisone tablets work was given, he will take 1 a day. The 1 mg prednisone tablets were given, he will start taking 4 tablets daily, taper by one tablet every 6 weeks. He will follow-up in 4 months. He will have blood work done today.  Jill Alexanders MD 11/24/2014 4:13 PM  [facility] [address] [place], [place] [address]  Phone [phone] Fax [phone]

## 2014-11-25 LAB — COMPREHENSIVE METABOLIC PANEL
ALT: 15 IU/L (ref 0–44)
AST: 26 IU/L (ref 0–40)
Albumin/Globulin Ratio: 1.9 (ref 1.1–2.5)
Albumin: 4 g/dL (ref 3.5–4.7)
Alkaline Phosphatase: 34 IU/L — ABNORMAL LOW (ref 39–117)
BUN/Creatinine Ratio: 13 (ref 10–22)
BUN: 12 mg/dL (ref 8–27)
Bilirubin Total: 1.4 mg/dL — ABNORMAL HIGH (ref 0.0–1.2)
CO2: 27 mmol/L (ref 18–29)
Calcium: 8.8 mg/dL (ref 8.6–10.2)
Chloride: 101 mmol/L (ref 97–108)
Creatinine, Ser: 0.92 mg/dL (ref 0.76–1.27)
GFR calc Af Amer: 88 mL/min/{1.73_m2} (ref >59)
GFR calc non Af Amer: 76 mL/min/{1.73_m2} (ref >59)
Globulin, Total: 2.1 g/dL (ref 1.5–4.5)
Glucose: 97 mg/dL (ref 65–99)
Potassium: 3.6 mmol/L (ref 3.5–5.2)
Sodium: 144 mmol/L (ref 134–144)
Total Protein: 6.1 g/dL (ref 6.0–8.5)

## 2014-11-25 LAB — CBC WITH DIFFERENTIAL/PLATELET
Basophils Absolute: 0 10*3/uL (ref 0.0–0.2)
Basos: 0 %
Eos: 3 %
Eosinophils Absolute: 0.2 10*3/uL (ref 0.0–0.4)
HCT: 44.4 % (ref 37.5–51.0)
Hemoglobin: 15 g/dL (ref 12.6–17.7)
Immature Grans (Abs): 0 10*3/uL (ref 0.0–0.1)
Immature Granulocytes: 0 %
Lymphocytes Absolute: 1.7 10*3/uL (ref 0.7–3.1)
Lymphs: 32 %
MCH: 30.5 pg (ref 26.6–33.0)
MCHC: 33.8 g/dL (ref 31.5–35.7)
MCV: 90 fL (ref 79–97)
Monocytes Absolute: 0.8 10*3/uL (ref 0.1–0.9)
Monocytes: 15 %
Neutrophils Absolute: 2.7 10*3/uL (ref 1.4–7.0)
Neutrophils Relative %: 50 %
Platelets: 242 10*3/uL (ref 150–379)
RBC: 4.92 x10E6/uL (ref 4.14–5.80)
RDW: 14.1 % (ref 12.3–15.4)
WBC: 5.5 10*3/uL (ref 3.4–10.8)

## 2014-11-28 ENCOUNTER — Other Ambulatory Visit: Payer: Self-pay | Admitting: Neurology

## 2014-11-28 NOTE — Telephone Encounter (Signed)
Janett Billow - This is Dr. Jannifer Franklin' patient. It got forwarded to me. Can you take care of this? I'm not work-in doctor today and I believe Dr. Jannifer Franklin in the office so maybe it was a mistake. Thank you.

## 2014-12-06 ENCOUNTER — Encounter: Payer: Self-pay | Admitting: Internal Medicine

## 2014-12-06 ENCOUNTER — Ambulatory Visit (INDEPENDENT_AMBULATORY_CARE_PROVIDER_SITE_OTHER): Payer: Medicare Other | Admitting: Internal Medicine

## 2014-12-06 VITALS — BP 120/74 | HR 88 | Ht 69.5 in | Wt 199.1 lb

## 2014-12-06 DIAGNOSIS — K3184 Gastroparesis: Secondary | ICD-10-CM | POA: Diagnosis not present

## 2014-12-06 DIAGNOSIS — G7000 Myasthenia gravis without (acute) exacerbation: Secondary | ICD-10-CM

## 2014-12-06 NOTE — Patient Instructions (Addendum)
Liberalize your gastroparesis diet as discussed with Dr Hilarie Fredrickson.  Please follow up with Dr Hilarie Fredrickson as needed.  CC:Dr Brigitte Pulse

## 2014-12-07 NOTE — Progress Notes (Signed)
Subjective:    Patient ID: Victor Sutton, male    DOB: 1928/07/05, 81 y.o.   MRN: [id number]  HPI Victor Sutton is an 81 yo male with PMH of MG, IDA felt secondary to large hiatal hernia status post repair, gastroparesis who is seen in follow-up. He is here today with his wife. He was last seen on 09/28/2014. He was experiencing upper abdominal tightness after eating along with early satiety, abdominal bloating and belching. He was not having much nausea or vomiting. He was using probiotic and also pantoprazole 40 mg daily. He had lost about 10 pounds at that time. We made the recommendation of a gastroparesis diet and also considered metoclopramide therapy. Dr. Jannifer Franklin, his neurologist, was okay with this medication if needed. He preferred nonpharmacologic approach possible.  He reports that he is feeling considerably better after following the stage III gastroparesis diet. He does miss eating foods like salads. He's only had 2 or 3 episodes of the "familiar feeling" of upper abdominal tightness and bloating. He says this feels like an overeating. Bowel movements have been slightly more sporadic since diet change. He's now having a bowel movement every 2-3 days but denies constipation, lower abdominal pain, rectal bleeding or melena. All he is very happy with the response he has had with diet change. Weight is down 5 additional pounds, but he is happy with this   Review of Systems As per HPI, otherwise negative  Current Medications, Allergies, Past Medical History, Past Surgical History, Family History and Social History were reviewed in [employer] record.     Objective:   Physical Exam BP 120/74 mmHg  Pulse 88  Ht 5' 9.5" (1.765 m)  Wt 199 lb 2 oz (90.323 kg)  BMI 28.99 kg/m2 Constitutional: Well-developed and well-nourished. No distress. HEENT: Normocephalic and atraumatic.  Conjunctivae are normal.  No scleral icterus. Neck: Neck supple. Trachea  midline. Cardiovascular: Normal rate, regular rhythm and intact distal pulses. No M/R/G Pulmonary/chest: Effort normal and breath sounds normal. No wheezing, rales or rhonchi. Abdominal: Soft, nontender, nondistended. Bowel sounds active throughout.  Extremities: no clubbing, cyanosis, or edema Neurological: Alert and oriented to person place and time. Skin: Skin is warm and dry. No rashes noted. Psychiatric: Normal mood and affect. Behavior is normal.  CBC    Component Value Date/Time   WBC 5.5 11/24/2014 0959   WBC 10.9* 02/11/2014 1105   RBC 4.92 11/24/2014 0959   RBC 4.71 02/11/2014 1105   HGB 15.0 11/24/2014 0959   HCT 44.4 11/24/2014 0959   PLT 242 11/24/2014 0959   MCV 90 11/24/2014 0959   MCH 30.5 11/24/2014 0959   MCH 30.4 02/11/2014 1105   MCHC 33.8 11/24/2014 0959   MCHC 32.3 02/11/2014 1105   RDW 14.1 11/24/2014 0959   RDW 13.7 02/11/2014 1105   LYMPHSABS 1.7 11/24/2014 0959   LYMPHSABS 1.0 02/11/2014 1105   MONOABS 1.2* 02/11/2014 1105   EOSABS 0.2 11/24/2014 0959   EOSABS 0.1 02/11/2014 1105   BASOSABS 0.0 11/24/2014 0959   BASOSABS 0.0 02/11/2014 1105   +GES Jan 2016     Assessment & Plan:   81 yo male with PMH of MG, IDA felt secondary to large hiatal hernia status post repair, gastroparesis who is seen in follow-up.  1. Gastroparesis/upper abdominal fullness and pressure -- symptoms significant improved with dietary modification. Given his overall improvement and desire to eat more familiar foods for him, we will liberalize diet on a gradual basis to see  which foods he can tolerate. He can add in small salads but I would still like him to avoid cruciferous vegetables. He can also add back meat but chew his food well and eat small portions. I asked that he notify me should symptoms return and if so he can go back to the stage III gastroparesis diet. Reglan could be added in the future if necessary but again he wishes to avoid this if possible, which is very  understandable  2. IDA -- resolved  3.  Myasthenia gravis -- managed by Dr. Jannifer Franklin of neurology  Follow-up as needed

## 2015-02-19 ENCOUNTER — Telehealth: Payer: Self-pay | Admitting: Internal Medicine

## 2015-02-19 NOTE — Telephone Encounter (Signed)
Spoke with pts wife and she states the pt will call back if he needs to be seen. States he has seen Dr. Hilarie Fredrickson for swallowing problems in the past. Discussed with her that he could see an app if he continues to have problems. She verbalized understanding.

## 2015-02-26 ENCOUNTER — Other Ambulatory Visit: Payer: Self-pay | Admitting: Neurology

## 2015-03-15 ENCOUNTER — Other Ambulatory Visit: Payer: Self-pay | Admitting: Neurology

## 2015-03-27 ENCOUNTER — Ambulatory Visit (INDEPENDENT_AMBULATORY_CARE_PROVIDER_SITE_OTHER): Payer: Medicare Other | Admitting: Neurology

## 2015-03-27 ENCOUNTER — Encounter: Payer: Self-pay | Admitting: Neurology

## 2015-03-27 VITALS — BP 136/81 | HR 73 | Ht 70.0 in | Wt 177.0 lb

## 2015-03-27 DIAGNOSIS — G7000 Myasthenia gravis without (acute) exacerbation: Secondary | ICD-10-CM | POA: Diagnosis not present

## 2015-03-27 DIAGNOSIS — Z5181 Encounter for therapeutic drug level monitoring: Secondary | ICD-10-CM | POA: Diagnosis not present

## 2015-03-27 MED ORDER — PREDNISONE 1 MG PO TABS: ORAL_TABLET | ORAL | Status: DC

## 2015-03-27 NOTE — Progress Notes (Signed)
Reason for visit: Myasthenia gravis  Victor Sutton is an 81 y.o. male  History of present illness:  Victor Sutton is an 81 year old right-handed white male with a history of myasthenia gravis with pharyngeal features. The patient is gradually tapering off of the prednisone, he currently is on 7 mg daily, he will be going to 6 mg daily within the next 2 weeks. He has not noted any increased fatigue or other myasthenic symptoms coming down off of the medication. He recently was treated for an Escherichia coli urinary tract infection. The patient denies any problems with chewing, speech, or swallowing. He does not have issues with ptosis or double vision. The patient is sleeping well at night, and he has a good energy level during the day. He is under a lot of stress as his wife has sarcoma, currently under treatment. The patient returns for an evaluation.  Past Medical History  Diagnosis Date  . BPH (benign prostatic hyperplasia)   . Macular degeneration     (L) wet, (R) dry  . B12 deficiency     HIstory  . 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 Leukoplakia 2001    Treated for  . Hypertension   . Iron deficiency anemia   . Cataract 2009    bilateral cataract surgery  . GERD (gastroesophageal reflux disease)   . Hyperlipidemia     not on medication  . Ulcer   . Shortness of breath   . Peripheral neuropathy     Mild History of  . Hiatal hernia     hiatial hernia repair  02/08/13  . Cancer   . Dysphonia 03/16/2013  . Dysphagia, pharyngoesophageal phase 03/16/2013  . Myasthenia gravis 03/21/2013  . Disturbance of salivary secretion 06/20/2013  . Adenomatous colon polyp   . Delayed gastric emptying   . Cameron ulcer   . Vitamin D deficiency     Past Surgical History  Procedure Laterality Date  . Cholecystectomy    . Arthroscopic knee surgery      left  . Multiple oral surgeries  2002  . Tonsillectomy    . Cateract surgeries       bilateral  . Stomach surgery  2005    states his  stomach had moved up toward his chest , some type of surgery performed  . Hernia repair      Laparoscopic ventral  . Hiatal hernia repair  2004  . Skin cancer excision  10/2012    right leg-shin area  . Hiatal hernia repair N/A 02/08/2013    Procedure:  REPAIR OFCURRENT HIATAL HERNIA, ;  Surgeon: Odis Hollingshead, MD;  Location: [facility];  Service: General;  Laterality: N/A;  . Upper gi endoscopy N/A 02/08/2013    Procedure: UPPER GI ENDOSCOPY;  Surgeon: Odis Hollingshead, MD;  Location: [facility];  Service: General;  Laterality: N/A;  . Laparoscopic lysis of adhesions N/A 02/08/2013    Procedure: LAPAROSCOPIC LYSIS OF ADHESIONS;  Surgeon: Odis Hollingshead, MD;  Location: [facility];  Service: General;  Laterality: N/A;  . Laparoscopic nissen fundoplication  [id number]    Procedure: LAPAROSCOPIC NISSEN FUNDOPLICATION;  Surgeon: Odis Hollingshead, MD;  Location: [facility];  Service: General;;  . Gastrostomy w/ feeding tube  [id number]    Family History  Problem Relation Age of Onset  . Prostate cancer Paternal Grandfather   . Breast cancer Sister   . CAD Sister   . COPD Sister   . Colon cancer Son  33  . Diabetes Father   . Heart attack Father   . Colon cancer Son 87    Social history:  reports that he quit smoking about 38 years ago. He has never used smokeless tobacco. He reports that he does not drink alcohol or use illicit drugs.    Allergies  Allergen Reactions  . Ciprofloxacin Anaphylaxis    Medications:  Prior to Admission medications   Medication Sig Start Date End Date Taking? Authorizing Provider  hydrochlorothiazide (HYDRODIURIL) 25 MG tablet TAKE 1 TABLET BY MOUTH DAILY. 11/28/14  Yes Kathrynn Ducking, MD  Multiple Vitamins-Minerals (ICAPS) CAPS Take 1 capsule by mouth 2 (two) times daily.   Yes Historical Provider, MD  mupirocin ointment (BACTROBAN) 2 % Apply 1 application topically as needed.  09/19/14  Yes Historical Provider, MD  mycophenolate (CELLCEPT) 250 MG capsule TAKE 2  CAPSULES BY MOUTH 2 TIMES DAILY. 02/26/15  Yes Kathrynn Ducking, MD  pantoprazole (PROTONIX) 40 MG tablet Take 40 mg by mouth 2 (two) times daily.  09/27/13  Yes Historical Provider, MD  potassium chloride (K-DUR) 10 MEQ tablet TAKE 1 TABLET BY MOUTH 2 TIMES DAILY. 03/15/15  Yes Kathrynn Ducking, MD  predniSONE (DELTASONE) 1 MG tablet Begin 4 tablets daily, taper by one tablet every 6 weeks. 11/24/14  Yes Kathrynn Ducking, MD  predniSONE (DELTASONE) 5 MG tablet Take 1 tablet (5 mg total) by mouth daily with breakfast. 11/24/14  Yes Kathrynn Ducking, MD  Probiotic Product (PROBIOTIC DAILY PO) Take 1 tablet by mouth daily. Pt takes Align   Yes Historical Provider, MD  Vitamin D, Ergocalciferol, (DRISDOL) 50000 UNITS CAPS capsule Take 50,000 Units by mouth every 7 (seven) days. ON FRIDAYS   Yes Historical Provider, MD  zoledronic acid (RECLAST) 5 MG/100ML SOLN injection Inject 5 mg into the vein once. YEARLY   Yes Historical Provider, MD    ROS:  Out of a complete 14 system review of symptoms, the patient complains only of the following symptoms, and all other reviewed systems are negative.  Bruising easily  Blood pressure 136/81, pulse 73, height 5\' 10"  (1.778 m), weight 177 lb (80.287 kg).  Physical Exam  General: The patient is alert and cooperative at the time of the examination.  Skin: 1+ edema at the ankles is noted bilaterally.   Neurologic Exam  Mental status: The patient is alert and oriented x 3 at the time of the examination. The patient has apparent normal recent and remote memory, with an apparently normal attention span and concentration ability.   Cranial nerves: Facial symmetry is present. Speech is normal, no aphasia or dysarthria is noted. Extraocular movements are full. Visual fields are full. With superior gaze for 1 minute, no ptosis or divergence of gaze is noted. There is no subjective double vision.  Motor: The patient has good strength in all 4 extremities. With arms  outstretched for 1 minute, no fatigable weakness of the deltoid muscles is noted.  Sensory examination: Soft touch sensation is symmetric on the face, arms, and legs.  Coordination: The patient has good finger-nose-finger and heel-to-shin bilaterally.  Gait and station: The patient has a normal gait. Tandem gait is unsteady. Romberg is negative. No drift is seen.  Reflexes: Deep tendon reflexes are symmetric.   Assessment/Plan:  1. Myasthenia gravis with pharyngeal features  The patient is doing quite well at this time, he is on a slow taper of the prednisone, doing well with this. He will be sent for blood  work today. He will continue the prednisone taper, and he will contact our office if he has any new symptoms. He will otherwise follow-up in 6 months. A prescription was called in for the 1 mg prednisone tablets. The patient is remaining on CellCept 500 mg twice daily.  Jill Alexanders MD 03/27/2015 7:01 PM  [facility] [address] [place], [place] [address]  Phone [phone] Fax [phone]

## 2015-03-27 NOTE — Patient Instructions (Signed)
Myasthenia Gravis Myasthenia gravis is a disease that causes muscle weakness throughout the body. The muscles affected are the ones we can control (voluntary muscles). An example of a voluntary muscle is your hand muscles. You can control the muscles to make the hand pick something up. An example of an involuntary muscle is the heart. The heart beats without any direction from you.  Myasthenia Gravis is thought to be an autoimmune disease. That means that normal defenses of the body begin to attack the body. In this case, the immune system begins to attack cells located at the junctions of the muscles and the nerves. Women are affected more often. Women are affected at a younger age than men. Babies born to affected women frequently develop symptoms at an early age. SYMPTOMS Initially in the disease, the facial muscles are affected first. After this, a person may develop droopy eyelids. They may have difficulty controlling facial muscles. They may have problems chewing. Swallowing and speaking may become impaired. The weakness gradually spreads to the arms and legs. It begins to affect breathing. Sometimes, the symptoms lessen or go away without any apparent cause. DIAGNOSIS  Diagnosis can be made with blood tests. Tests such as electromyography may be done to examine the electrical activity in the muscle. An improvement in symptoms after having an anti-cholinesterase drug helps confirm the diagnosis.  TREATMENT  Medicines are usually prescribed as the first treatment. These medicines help, but they do not cure the disease. A plasma cleansing procedure (plasmapheresis) can be used to treat a crisis. It can also be used to prepare a person for surgery. This procedure produces short-term improvement. Some cases are helped by removing the thymus gland. Steroids are used for short-term benefits. Document Released: 12/08/2000 Document Revised: 11/24/2011 Document Reviewed: 11/02/2013 [employer] Patient  Information 2015 [employer]. This information is not intended to replace advice given to you by your health care provider. Make sure you discuss any questions you have with your health care provider.  

## 2015-03-28 ENCOUNTER — Other Ambulatory Visit (HOSPITAL_COMMUNITY): Payer: Self-pay | Admitting: *Deleted

## 2015-03-28 LAB — CBC WITH DIFFERENTIAL/PLATELET
Basophils Absolute: 0 10*3/uL (ref 0.0–0.2)
Basos: 0 %
EOS (ABSOLUTE): 0.1 10*3/uL (ref 0.0–0.4)
Eos: 1 %
Hematocrit: 45.1 % (ref 37.5–51.0)
Hemoglobin: 14.6 g/dL (ref 12.6–17.7)
Immature Grans (Abs): 0 10*3/uL (ref 0.0–0.1)
Immature Granulocytes: 0 %
Lymphocytes Absolute: 1.1 10*3/uL (ref 0.7–3.1)
Lymphs: 23 %
MCH: 30.4 pg (ref 26.6–33.0)
MCHC: 32.4 g/dL (ref 31.5–35.7)
MCV: 94 fL (ref 79–97)
Monocytes Absolute: 0.6 10*3/uL (ref 0.1–0.9)
Monocytes: 13 %
Neutrophils Absolute: 2.9 10*3/uL (ref 1.4–7.0)
Neutrophils: 63 %
Platelets: 329 10*3/uL (ref 150–379)
RBC: 4.81 x10E6/uL (ref 4.14–5.80)
RDW: 14.5 % (ref 12.3–15.4)
WBC: 4.7 10*3/uL (ref 3.4–10.8)

## 2015-03-28 LAB — COMPREHENSIVE METABOLIC PANEL
ALT: 19 IU/L (ref 0–44)
AST: 25 IU/L (ref 0–40)
Albumin/Globulin Ratio: 1.6 (ref 1.1–2.5)
Albumin: 3.9 g/dL (ref 3.5–4.7)
Alkaline Phosphatase: 40 IU/L (ref 39–117)
BUN/Creatinine Ratio: 15 (ref 10–22)
BUN: 13 mg/dL (ref 8–27)
Bilirubin Total: 1 mg/dL (ref 0.0–1.2)
CO2: 27 mmol/L (ref 18–29)
Calcium: 9 mg/dL (ref 8.6–10.2)
Chloride: 99 mmol/L (ref 97–108)
Creatinine, Ser: 0.85 mg/dL (ref 0.76–1.27)
GFR calc Af Amer: 92 mL/min/{1.73_m2} (ref >59)
GFR calc non Af Amer: 80 mL/min/{1.73_m2} (ref >59)
Globulin, Total: 2.5 g/dL (ref 1.5–4.5)
Glucose: 101 mg/dL — ABNORMAL HIGH (ref 65–99)
Potassium: 3.7 mmol/L (ref 3.5–5.2)
Sodium: 141 mmol/L (ref 134–144)
Total Protein: 6.4 g/dL (ref 6.0–8.5)

## 2015-03-29 ENCOUNTER — Ambulatory Visit (HOSPITAL_COMMUNITY)
Admission: RE | Admit: 2015-03-29 | Discharge: 2015-03-29 | Disposition: A | Discharge status: Home / Self Care | Payer: Medicare Other | Source: Ambulatory Visit | Attending: Internal Medicine | Admitting: Internal Medicine

## 2015-03-29 DIAGNOSIS — M810 Age-related osteoporosis without current pathological fracture: Secondary | ICD-10-CM | POA: Insufficient documentation

## 2015-03-29 MED ORDER — ZOLEDRONIC ACID 5 MG/100ML IV SOLN
INTRAVENOUS | Status: AC
  Administered 2015-03-29: 5 mg
  Filled 2015-03-29: qty 100

## 2015-03-29 MED ORDER — ZOLEDRONIC ACID 5 MG/100ML IV SOLN: 5.0000 mg | Freq: Once | INTRAVENOUS | Status: DC

## 2015-03-29 MED ORDER — SODIUM CHLORIDE 0.9 % IV SOLN
Freq: Once | INTRAVENOUS | Status: AC
  Administered 2015-03-29: 12:00:00 via INTRAVENOUS

## 2015-04-02 ENCOUNTER — Encounter (HOSPITAL_COMMUNITY): Payer: Medicare Other

## 2015-06-01 ENCOUNTER — Other Ambulatory Visit: Payer: Self-pay | Admitting: Neurology

## 2015-06-26 ENCOUNTER — Telehealth: Payer: Self-pay | Admitting: Neurology

## 2015-06-26 ENCOUNTER — Other Ambulatory Visit (INDEPENDENT_AMBULATORY_CARE_PROVIDER_SITE_OTHER): Payer: Self-pay

## 2015-06-26 DIAGNOSIS — Z5181 Encounter for therapeutic drug level monitoring: Secondary | ICD-10-CM

## 2015-06-26 DIAGNOSIS — Z0289 Encounter for other administrative examinations: Secondary | ICD-10-CM

## 2015-06-26 NOTE — Telephone Encounter (Signed)
-----   Message from Kathrynn Ducking, MD sent at 03/28/2015  1:51 PM EDT ----- Recheck comprehensive metabolic profile, CBC. Patient is on CellCept

## 2015-06-26 NOTE — Telephone Encounter (Signed)
I called the patient. The patient is to come in for CBC and CMET on Cellcept.

## 2015-06-27 LAB — COMPREHENSIVE METABOLIC PANEL
ALT: 14 IU/L (ref 0–44)
AST: 25 IU/L (ref 0–40)
Albumin/Globulin Ratio: 1.9 (ref 1.1–2.5)
Albumin: 3.9 g/dL (ref 3.5–4.7)
Alkaline Phosphatase: 32 IU/L — ABNORMAL LOW (ref 39–117)
BUN/Creatinine Ratio: 8 — ABNORMAL LOW (ref 10–22)
BUN: 7 mg/dL — ABNORMAL LOW (ref 8–27)
Bilirubin Total: 1.7 mg/dL — ABNORMAL HIGH (ref 0.0–1.2)
CO2: 29 mmol/L (ref 18–29)
Calcium: 9 mg/dL (ref 8.6–10.2)
Chloride: 100 mmol/L (ref 97–108)
Creatinine, Ser: 0.85 mg/dL (ref 0.76–1.27)
GFR calc Af Amer: 92 mL/min/{1.73_m2} (ref >59)
GFR calc non Af Amer: 80 mL/min/{1.73_m2} (ref >59)
Globulin, Total: 2.1 g/dL (ref 1.5–4.5)
Glucose: 91 mg/dL (ref 65–99)
Potassium: 3.5 mmol/L (ref 3.5–5.2)
Sodium: 142 mmol/L (ref 134–144)
Total Protein: 6 g/dL (ref 6.0–8.5)

## 2015-06-27 LAB — CBC WITH DIFFERENTIAL/PLATELET
Basophils Absolute: 0 10*3/uL (ref 0.0–0.2)
Basos: 0 %
EOS (ABSOLUTE): 0.2 10*3/uL (ref 0.0–0.4)
Eos: 4 %
Hematocrit: 44.5 % (ref 37.5–51.0)
Hemoglobin: 14.7 g/dL (ref 12.6–17.7)
Immature Grans (Abs): 0 10*3/uL (ref 0.0–0.1)
Immature Granulocytes: 0 %
Lymphocytes Absolute: 1.9 10*3/uL (ref 0.7–3.1)
Lymphs: 42 %
MCH: 31.3 pg (ref 26.6–33.0)
MCHC: 33 g/dL (ref 31.5–35.7)
MCV: 95 fL (ref 79–97)
Monocytes Absolute: 0.4 10*3/uL (ref 0.1–0.9)
Monocytes: 8 %
Neutrophils Absolute: 2.1 10*3/uL (ref 1.4–7.0)
Neutrophils: 46 %
Platelets: 262 10*3/uL (ref 150–379)
RBC: 4.7 x10E6/uL (ref 4.14–5.80)
RDW: 13.5 % (ref 12.3–15.4)
WBC: 4.6 10*3/uL (ref 3.4–10.8)

## 2015-07-05 ENCOUNTER — Other Ambulatory Visit: Payer: Self-pay | Admitting: Internal Medicine

## 2015-07-05 DIAGNOSIS — R634 Abnormal weight loss: Secondary | ICD-10-CM

## 2015-07-05 DIAGNOSIS — R110 Nausea: Secondary | ICD-10-CM

## 2015-07-11 ENCOUNTER — Ambulatory Visit
Admission: RE | Admit: 2015-07-11 | Discharge: 2015-07-11 | Disposition: A | Discharge status: Home / Self Care | Payer: Medicare Other | Source: Ambulatory Visit | Attending: Internal Medicine | Admitting: Internal Medicine

## 2015-07-11 ENCOUNTER — Other Ambulatory Visit: Payer: Self-pay | Admitting: Internal Medicine

## 2015-07-11 ENCOUNTER — Inpatient Hospital Stay: Admission: RE | Admit: 2015-07-11 | Payer: Medicare Other | Source: Ambulatory Visit

## 2015-07-11 DIAGNOSIS — R634 Abnormal weight loss: Secondary | ICD-10-CM

## 2015-07-11 DIAGNOSIS — R110 Nausea: Secondary | ICD-10-CM

## 2015-07-11 DIAGNOSIS — G7000 Myasthenia gravis without (acute) exacerbation: Secondary | ICD-10-CM

## 2015-07-11 MED ORDER — IOPAMIDOL (ISOVUE-300) INJECTION 61%
100.0000 mL | Freq: Once | INTRAVENOUS | Status: AC | PRN
  Administered 2015-07-11: 100 mL via INTRAVENOUS

## 2015-07-16 ENCOUNTER — Other Ambulatory Visit: Payer: Self-pay | Admitting: Internal Medicine

## 2015-07-16 DIAGNOSIS — K862 Cyst of pancreas: Secondary | ICD-10-CM

## 2015-07-17 ENCOUNTER — Inpatient Hospital Stay: Admission: RE | Admit: 2015-07-17 | Payer: Medicare Other | Source: Ambulatory Visit

## 2015-07-18 ENCOUNTER — Ambulatory Visit
Admission: RE | Admit: 2015-07-18 | Discharge: 2015-07-18 | Disposition: A | Discharge status: Home / Self Care | Payer: Medicare Other | Source: Ambulatory Visit | Attending: Internal Medicine | Admitting: Internal Medicine

## 2015-07-18 ENCOUNTER — Other Ambulatory Visit: Payer: Medicare Other

## 2015-07-18 DIAGNOSIS — K862 Cyst of pancreas: Secondary | ICD-10-CM

## 2015-07-18 MED ORDER — GADOBENATE DIMEGLUMINE 529 MG/ML IV SOLN
15.0000 mL | Freq: Once | INTRAVENOUS | Status: AC | PRN
  Administered 2015-07-18: 15 mL via INTRAVENOUS

## 2015-07-18 MED ORDER — GADOBENATE DIMEGLUMINE 529 MG/ML IV SOLN: 15.0000 mL | Freq: Once | INTRAVENOUS | Status: DC | PRN

## 2015-07-25 ENCOUNTER — Ambulatory Visit
Admission: RE | Admit: 2015-07-25 | Discharge: 2015-07-25 | Disposition: A | Discharge status: Home / Self Care | Payer: Medicare Other | Source: Ambulatory Visit | Attending: Internal Medicine | Admitting: Internal Medicine

## 2015-07-25 DIAGNOSIS — R110 Nausea: Secondary | ICD-10-CM

## 2015-07-25 DIAGNOSIS — R634 Abnormal weight loss: Secondary | ICD-10-CM

## 2015-07-25 MED ORDER — IOPAMIDOL (ISOVUE-300) INJECTION 61%
75.0000 mL | Freq: Once | INTRAVENOUS | Status: AC | PRN
  Administered 2015-07-25: 75 mL via INTRAVENOUS

## 2015-08-02 ENCOUNTER — Other Ambulatory Visit: Payer: Self-pay | Admitting: Dermatology

## 2015-09-26 ENCOUNTER — Encounter: Payer: Self-pay | Admitting: Adult Health

## 2015-09-26 ENCOUNTER — Ambulatory Visit (INDEPENDENT_AMBULATORY_CARE_PROVIDER_SITE_OTHER): Payer: Medicare Other | Admitting: Adult Health

## 2015-09-26 VITALS — BP 134/78 | HR 72 | Resp 20 | Ht 70.0 in | Wt 173.0 lb

## 2015-09-26 DIAGNOSIS — Z5181 Encounter for therapeutic drug level monitoring: Secondary | ICD-10-CM | POA: Diagnosis not present

## 2015-09-26 DIAGNOSIS — G7000 Myasthenia gravis without (acute) exacerbation: Secondary | ICD-10-CM | POA: Diagnosis not present

## 2015-09-26 DIAGNOSIS — Z87898 Personal history of other specified conditions: Secondary | ICD-10-CM | POA: Diagnosis not present

## 2015-09-26 NOTE — Progress Notes (Signed)
PATIENT: Victor Sutton DOB: [birth year]  REASON FOR VISIT: follow up HISTORY FROM: patient  HISTORY OF PRESENT ILLNESS: Victor Sutton is a 82 year old male with a history of myasthenia gravis with pharyngeal features. He returns today for follow-up. The patient is currently on 2 mg of prednisone. He is been doing a taper for every 6 weeks. The patient remains on CellCept 500 mg twice a day. The patient denies any symptoms associated with myasthenia gravis. Denies any difficulty chewing or swallowing. Denies any weakness in the upper or lower extremities. Denies difficulty breathing. Denies ptosis and diplopia. He denies any new neurological symptoms. Patient reports that he's had an elevated PSA in the past. According to the patient as well as checked by Victor Sutton. The patient is requesting that we recheck this lab work today if withdrawing additional blood work. He returns today for an evaluation. Marland Kitchen  HISTORY 03/27/15 (Victor Sutton): Victor Sutton is an 82 year old right-handed white male with a history of myasthenia gravis with pharyngeal features. The patient is gradually tapering off of the prednisone, he currently is on 7 mg daily, he will be going to 6 mg daily within the next 2 weeks. He has not noted any increased fatigue or other myasthenic symptoms coming down off of the medication. He recently was treated for an Escherichia coli urinary tract infection. The patient denies any problems with chewing, speech, or swallowing. He does not have issues with ptosis or double vision. The patient is sleeping well at night, and he has a good energy level during the day. He is under a lot of stress as his wife has sarcoma, currently under treatment. The patient returns for an evaluation.  REVIEW OF SYSTEMS: Out of a complete 14 system review of symptoms, the patient complains only of the following symptoms, and all other reviewed systems are negative.  See history of present illness  ALLERGIES: Allergies    Allergen Reactions  . Ciprofloxacin Anaphylaxis    HOME MEDICATIONS: Outpatient Prescriptions Prior to Visit  Medication Sig Dispense Refill  . hydrochlorothiazide (HYDRODIURIL) 25 MG tablet TAKE 1 TABLET BY MOUTH DAILY. 90 tablet 1  . Multiple Vitamins-Minerals (ICAPS) CAPS Take 1 capsule by mouth 2 (two) times daily.    . mupirocin ointment (BACTROBAN) 2 % Apply 1 application topically as needed.   2  . mycophenolate (CELLCEPT) 250 MG capsule TAKE 2 CAPSULES BY MOUTH 2 TIMES DAILY. 120 capsule 6  . pantoprazole (PROTONIX) 40 MG tablet Take 40 mg by mouth 2 (two) times daily.     . potassium chloride (K-DUR) 10 MEQ tablet TAKE 1 TABLET BY MOUTH 2 TIMES DAILY. 60 tablet 6  . predniSONE (DELTASONE) 1 MG tablet Begin 4 tablets daily, taper by one tablet every 6 weeks. 120 tablet 3  . Vitamin D, Ergocalciferol, (DRISDOL) 50000 UNITS CAPS capsule Take 50,000 Units by mouth every 7 (seven) days. ON FRIDAYS    . zoledronic acid (RECLAST) 5 MG/100ML SOLN injection Inject 5 mg into the vein once. YEARLY    . predniSONE (DELTASONE) 5 MG tablet TAKE 1 TABLET (5 MG TOTAL) BY MOUTH DAILY WITH BREAKFAST. 90 tablet 0  . Probiotic Product (PROBIOTIC DAILY PO) Take 1 tablet by mouth daily. Pt takes Align     No facility-administered medications prior to visit.    PAST MEDICAL HISTORY: Past Medical History  Diagnosis Date  . BPH (benign prostatic hyperplasia)   . Macular degeneration     (L) wet, (R) dry  . B12 deficiency  HIstory  . Polyneuritis ([place]) 1965    Resolved  . Leukoplakia 2001    Treated for  . Hypertension   . Iron deficiency anemia   . Cataract 2009    bilateral cataract surgery  . GERD (gastroesophageal reflux disease)   . Hyperlipidemia     not on medication  . Ulcer   . Shortness of breath   . Peripheral neuropathy ([facility])     Mild History of  . Hiatal hernia     hiatial hernia repair  02/08/13  . Cancer ([place])   . Dysphonia 03/16/2013  . Dysphagia, pharyngoesophageal  phase 03/16/2013  . Myasthenia gravis ([place]) 03/21/2013  . Disturbance of salivary secretion 06/20/2013  . Adenomatous colon polyp   . Delayed gastric emptying   . Cameron ulcer   . Vitamin D deficiency     PAST SURGICAL HISTORY: Past Surgical History  Procedure Laterality Date  . Cholecystectomy    . Arthroscopic knee surgery      left  . Multiple oral surgeries  2002  . Tonsillectomy    . Cateract surgeries       bilateral  . Stomach surgery  2005    states his stomach had moved up toward his chest , some type of surgery performed  . Hernia repair      Laparoscopic ventral  . Hiatal hernia repair  2004  . Skin cancer excision  10/2012    right leg-shin area  . Hiatal hernia repair N/A 02/08/2013    Procedure:  REPAIR OFCURRENT HIATAL HERNIA, ;  Surgeon: Odis Hollingshead, MD;  Location: [facility];  Service: General;  Laterality: N/A;  . Upper gi endoscopy N/A 02/08/2013    Procedure: UPPER GI ENDOSCOPY;  Surgeon: Odis Hollingshead, MD;  Location: [facility];  Service: General;  Laterality: N/A;  . Laparoscopic lysis of adhesions N/A 02/08/2013    Procedure: LAPAROSCOPIC LYSIS OF ADHESIONS;  Surgeon: Odis Hollingshead, MD;  Location: [facility];  Service: General;  Laterality: N/A;  . Laparoscopic nissen fundoplication  [id number]    Procedure: LAPAROSCOPIC NISSEN FUNDOPLICATION;  Surgeon: Odis Hollingshead, MD;  Location: [facility];  Service: General;;  . Gastrostomy w/ feeding tube  [id number]    FAMILY HISTORY: Family History  Problem Relation Age of Onset  . Prostate cancer Paternal Grandfather   . Breast cancer Sister   . CAD Sister   . COPD Sister   . Colon cancer Son 48  . Diabetes Father   . Heart attack Father   . Colon cancer Son 83    SOCIAL HISTORY: Social History   Social History  . Marital Status: Married    Spouse Name: N/A  . Number of Children: 3  . Years of Education: 16   Occupational History  . Retired   .     Social History Main Topics  . Smoking status:  Former Smoker    Quit date: 09/15/1976  . Smokeless tobacco: Never Used     Comment: Quit 1980  . Alcohol Use: No     Comment: Moderate alcohol, wine  . Drug Use: No  . Sexual Activity: Not on file   Other Topics Concern  . Not on file   Social History Narrative   Patient is right-handed.   Patient drinks caffeine occasionally.            PHYSICAL EXAM  Filed Vitals:   09/26/15 1325  BP: 134/78  Pulse: 72  Resp: 20  Height: 5'  10" (1.778 m)  Weight: 173 lb (78.472 kg)   Body mass index is 24.82 kg/(m^2).  Generalized: Well developed, in no acute distress   Neurological examination  Mentation: Alert oriented to time, place, history taking. Follows all commands speech and language fluent Cranial nerve II-XII: Pupils were equal round reactive to light. Extraocular movements were full, visual field were full on confrontational test. Facial sensation and strength were normal. Uvula tongue midline. Head turning and shoulder shrug  were normal and symmetric. Normal eye closure, no puff weakness. Motor: The motor testing reveals 5 over 5 strength of all 4 extremities. Good symmetric motor tone is noted throughout.  Sensory: Sensory testing is intact to soft touch on all 4 extremities. No evidence of extinction is noted.  Coordination: Cerebellar testing reveals good finger-nose-finger and heel-to-shin bilaterally.  Gait and station: Gait is normal. Tandem gait is slightly unsteady. Romberg is negative. No drift is seen.  Reflexes: Deep tendon reflexes are symmetric and normal bilaterally.   DIAGNOSTIC DATA (LABS, IMAGING, TESTING) - I reviewed patient records, labs, notes, testing and imaging myself where available.  Lab Results  Component Value Date   WBC 4.6 06/26/2015   HGB 15.0 11/24/2014   HCT 44.5 06/26/2015   MCV 95 06/26/2015   PLT 262 06/26/2015      Component Value Date/Time   NA 142 06/26/2015 0933   NA 143 02/11/2014 1131   K 3.5 06/26/2015 0933   CL  100 06/26/2015 0933   CO2 29 06/26/2015 0933   GLUCOSE 91 06/26/2015 0933   GLUCOSE 105* 02/11/2014 1131   BUN 7* 06/26/2015 0933   BUN 13 02/11/2014 1131   CREATININE 0.85 06/26/2015 0933   CALCIUM 9.0 06/26/2015 0933   PROT 6.0 06/26/2015 0933   PROT 6.9 02/23/2013 0431   ALBUMIN 3.9 06/26/2015 0933   ALBUMIN 2.5* 02/23/2013 0431   AST 25 06/26/2015 0933   ALT 14 06/26/2015 0933   ALKPHOS 32* 06/26/2015 0933   BILITOT 1.7* 06/26/2015 0933   BILITOT 1.4* 07/25/2014 1239   GFRNONAA 80 06/26/2015 0933   GFRAA 92 06/26/2015 0933       ASSESSMENT AND PLAN 82 y.o. year old male  has a past medical history of BPH (benign prostatic hyperplasia); Macular degeneration; B12 deficiency; Polyneuritis ([place]) (1965); Leukoplakia (2001); Hypertension; Iron deficiency anemia; Cataract (2009); GERD (gastroesophageal reflux disease); Hyperlipidemia; Ulcer; Shortness of breath; Peripheral neuropathy ([place]); Hiatal hernia; Cancer ([place]); Dysphonia (03/16/2013); Dysphagia, pharyngoesophageal phase (03/16/2013); Myasthenia gravis ([place]) (03/21/2013); Disturbance of salivary secretion (06/20/2013); Adenomatous colon polyp; Delayed gastric emptying; Cameron ulcer; and Vitamin D deficiency. here with:  1. Myasthenia gravis 2. History of elevated PSA  Overall the patient is doing well. He will continue on the prednisone taper. In 6 weeks he will decrease his prednisone to 1 mg. He will remain on 1 mg for 6 weeks and then discontinue the medication. For now he will remain on CellCept 500 mg twice a day. We will also check basic blood work today. The patient is requesting that we check a PSA level so he does not have to have blood drawn again when he sees his primary care provider. According to the patient Victor Sutton check this in the past. We will check this today. The patient is amenable to this plan. He is advised that if his symptoms worsen or he develops new symptoms he should let us know. He will follow-up in 4-5  months or sooner if needed.     Ward Givens,  MSN, NP-C 09/26/2015, 4:25 PM [facility] [address] [place], [place] [address] [phone]

## 2015-09-26 NOTE — Patient Instructions (Signed)
Continue with Prednisone taper Continue Cellcept Blood work today If your symptoms worsen or you develop new symptoms please let us know.

## 2015-09-26 NOTE — Progress Notes (Signed)
I have read the note, and I agree with the clinical assessment and plan.  Corra Kaine KEITH   

## 2015-09-27 ENCOUNTER — Ambulatory Visit: Payer: Medicare Other | Admitting: Neurology

## 2015-09-27 ENCOUNTER — Telehealth: Payer: Self-pay

## 2015-09-27 LAB — CBC WITH DIFFERENTIAL/PLATELET
Basophils Absolute: 0 10*3/uL (ref 0.0–0.2)
Basos: 0 %
EOS (ABSOLUTE): 0.1 10*3/uL (ref 0.0–0.4)
Eos: 2 %
Hematocrit: 44.9 % (ref 37.5–51.0)
Hemoglobin: 14.8 g/dL (ref 12.6–17.7)
Immature Grans (Abs): 0 10*3/uL (ref 0.0–0.1)
Immature Granulocytes: 0 %
Lymphocytes Absolute: 1.1 10*3/uL (ref 0.7–3.1)
Lymphs: 24 %
MCH: 31 pg (ref 26.6–33.0)
MCHC: 33 g/dL (ref 31.5–35.7)
MCV: 94 fL (ref 79–97)
Monocytes Absolute: 0.5 10*3/uL (ref 0.1–0.9)
Monocytes: 10 %
Neutrophils Absolute: 3 10*3/uL (ref 1.4–7.0)
Neutrophils: 64 %
Platelets: 247 10*3/uL (ref 150–379)
RBC: 4.77 x10E6/uL (ref 4.14–5.80)
RDW: 13.4 % (ref 12.3–15.4)
WBC: 4.7 10*3/uL (ref 3.4–10.8)

## 2015-09-27 LAB — COMPREHENSIVE METABOLIC PANEL
ALT: 17 IU/L (ref 0–44)
AST: 27 IU/L (ref 0–40)
Albumin/Globulin Ratio: 1.8 (ref 1.1–2.5)
Albumin: 3.9 g/dL (ref 3.5–4.7)
Alkaline Phosphatase: 38 IU/L — ABNORMAL LOW (ref 39–117)
BUN/Creatinine Ratio: 13 (ref 10–22)
BUN: 10 mg/dL (ref 8–27)
Bilirubin Total: 1.2 mg/dL (ref 0.0–1.2)
CO2: 30 mmol/L — ABNORMAL HIGH (ref 18–29)
Calcium: 9.4 mg/dL (ref 8.6–10.2)
Chloride: 101 mmol/L (ref 96–106)
Creatinine, Ser: 0.8 mg/dL (ref 0.76–1.27)
GFR calc Af Amer: 94 mL/min/{1.73_m2} (ref >59)
GFR calc non Af Amer: 81 mL/min/{1.73_m2} (ref >59)
Globulin, Total: 2.2 g/dL (ref 1.5–4.5)
Glucose: 104 mg/dL — ABNORMAL HIGH (ref 65–99)
Potassium: 5.4 mmol/L — ABNORMAL HIGH (ref 3.5–5.2)
Sodium: 142 mmol/L (ref 134–144)
Total Protein: 6.1 g/dL (ref 6.0–8.5)

## 2015-09-27 LAB — PSA: Prostate Specific Ag, Serum: 0.7 ng/mL (ref 0.0–4.0)

## 2015-09-27 NOTE — Telephone Encounter (Signed)
Spoke to pt's wife, [name], per [place]. Advised her that pt's labs were ok, his potassium is slightly elevated, and I will send it to Dr. Brigitte Pulse for review.  Pt's wife is asking about the potassium that Dr. Jannifer Franklin has been prescribing, she reports that it was Dr. Jannifer Franklin who asked pt to start taking it. He is taking potassium chloride 10 meq BID. She is asking if he should continue taking this medication. I advised her that I would send this message to Jinny Blossom, NP and our office will call them back. Pt's wife verbalized understanding.

## 2015-09-27 NOTE — Telephone Encounter (Signed)
-----   Message from Ward Givens, NP sent at 09/27/2015  7:44 AM EST ----- Lab work is ok. Potassium is slightly elevated. I will forward to his PCP for review. Please call the patient.

## 2015-09-27 NOTE — Telephone Encounter (Signed)
He can decrease potassium to 10 MEQ 1 tablet daily. We will recheck lab work in 2 months.

## 2015-09-27 NOTE — Telephone Encounter (Signed)
Spoke to pt. I advised him that Victor Blossom, NP wants him to decrease his potassium from 10 meq  BID to just 1 tablet daily, and that we will recheck his blood work in a couple months. Pt verbalized understanding.

## 2015-09-28 ENCOUNTER — Other Ambulatory Visit: Payer: Self-pay

## 2015-09-28 MED ORDER — MYCOPHENOLATE MOFETIL 250 MG PO CAPS: ORAL_CAPSULE | ORAL | Status: DC

## 2015-11-06 ENCOUNTER — Other Ambulatory Visit: Payer: Self-pay

## 2015-11-06 MED ORDER — POTASSIUM CHLORIDE ER 10 MEQ PO TBCR: 10.0000 meq | EXTENDED_RELEASE_TABLET | Freq: Two times a day (BID) | ORAL | Status: DC

## 2015-11-27 DIAGNOSIS — H35341 Macular cyst, hole, or pseudohole, right eye: Secondary | ICD-10-CM | POA: Diagnosis not present

## 2015-11-27 DIAGNOSIS — H353190 Nonexudative age-related macular degeneration, unspecified eye, stage unspecified: Secondary | ICD-10-CM | POA: Diagnosis not present

## 2015-11-27 DIAGNOSIS — Z961 Presence of intraocular lens: Secondary | ICD-10-CM | POA: Diagnosis not present

## 2015-12-05 ENCOUNTER — Telehealth: Payer: Self-pay | Admitting: Adult Health

## 2015-12-05 DIAGNOSIS — E875 Hyperkalemia: Secondary | ICD-10-CM

## 2015-12-05 NOTE — Telephone Encounter (Signed)
Spoke to patient - he will come by next week for his labs.

## 2015-12-05 NOTE — Telephone Encounter (Signed)
Please call patient and have him come in to recheck his potassium level.

## 2015-12-10 ENCOUNTER — Other Ambulatory Visit (INDEPENDENT_AMBULATORY_CARE_PROVIDER_SITE_OTHER): Payer: Self-pay

## 2015-12-10 DIAGNOSIS — E875 Hyperkalemia: Secondary | ICD-10-CM | POA: Diagnosis not present

## 2015-12-10 DIAGNOSIS — Z0289 Encounter for other administrative examinations: Secondary | ICD-10-CM

## 2015-12-11 ENCOUNTER — Telehealth: Payer: Self-pay

## 2015-12-11 LAB — BASIC METABOLIC PANEL
BUN/Creatinine Ratio: 13 (ref 10–22)
BUN: 9 mg/dL (ref 8–27)
CO2: 29 mmol/L (ref 18–29)
Calcium: 9.7 mg/dL (ref 8.6–10.2)
Chloride: 95 mmol/L — ABNORMAL LOW (ref 96–106)
Creatinine, Ser: 0.72 mg/dL — ABNORMAL LOW (ref 0.76–1.27)
GFR calc Af Amer: 98 mL/min/{1.73_m2} (ref >59)
GFR calc non Af Amer: 85 mL/min/{1.73_m2} (ref >59)
Glucose: 96 mg/dL (ref 65–99)
Potassium: 4.3 mmol/L (ref 3.5–5.2)
Sodium: 138 mmol/L (ref 134–144)

## 2015-12-11 NOTE — Telephone Encounter (Signed)
-----   Message from Ward Givens, NP sent at 12/11/2015  7:38 AM EDT ----- Lab work ok. Please call patient.

## 2015-12-11 NOTE — Telephone Encounter (Signed)
Spoke to pt's wife per [facility], and advised her that [place] reviewed pt's lab work and it looks ok. Pt's wife verbalized understanding.

## 2016-01-22 ENCOUNTER — Encounter: Payer: Self-pay | Admitting: Adult Health

## 2016-01-22 ENCOUNTER — Other Ambulatory Visit: Payer: Self-pay | Admitting: Internal Medicine

## 2016-01-22 ENCOUNTER — Ambulatory Visit (INDEPENDENT_AMBULATORY_CARE_PROVIDER_SITE_OTHER): Payer: Medicare Other | Admitting: Adult Health

## 2016-01-22 VITALS — BP 126/71 | HR 68 | Ht 70.0 in | Wt 169.6 lb

## 2016-01-22 DIAGNOSIS — Z5181 Encounter for therapeutic drug level monitoring: Secondary | ICD-10-CM

## 2016-01-22 DIAGNOSIS — K862 Cyst of pancreas: Secondary | ICD-10-CM

## 2016-01-22 DIAGNOSIS — G7000 Myasthenia gravis without (acute) exacerbation: Secondary | ICD-10-CM | POA: Diagnosis not present

## 2016-01-22 MED ORDER — MYCOPHENOLATE MOFETIL 250 MG PO CAPS: ORAL_CAPSULE | ORAL | Status: DC

## 2016-01-22 NOTE — Progress Notes (Signed)
I have read the note, and I agree with the clinical assessment and plan.  Kyreese Chio KEITH   

## 2016-01-22 NOTE — Patient Instructions (Signed)
Decrease cellcept to 1 tablet in the AM and 2 Tablets in the PM Blood work today If your symptoms worsen or you develop new symptoms please let us know.

## 2016-01-22 NOTE — Progress Notes (Signed)
PATIENT: Victor Sutton DOB: 03-28-1928  REASON FOR VISIT: follow up- myasthenia gravis HISTORY FROM: patient  HISTORY OF PRESENT ILLNESS: Victor Sutton is an 82 year old male with a history of myasthenia gravis with pharyngeal features. He returns today for follow-up. The patient is no longer on prednisone and has been doing well. He continues to take CellCept 500 mg twice a day. He denies any trouble swallowing or difficulty chewing. Denies diplopia and ptosis. Denies any weakness in the extremities. The patient does report that he had an MRI of the abdomen that showed a cyst on the pancreas and adrenal glands. He has a repeat MRI next week. Overall he feels that he is doing well. He returns today for an evaluation.  HISTORY 09/26/15 (MM): Victor Sutton is a 82 year old male with a history of myasthenia gravis with pharyngeal features. He returns today for follow-up. The patient is currently on 2 mg of prednisone. He is been doing a taper for every 6 weeks. The patient remains on CellCept 500 mg twice a day. The patient denies any symptoms associated with myasthenia gravis. Denies any difficulty chewing or swallowing. Denies any weakness in the upper or lower extremities. Denies difficulty breathing. Denies ptosis and diplopia. He denies any new neurological symptoms. Patient reports that he's had an elevated PSA in the past. According to the patient as well as checked by Dr. Jannifer Franklin. The patient is requesting that we recheck this lab work today if withdrawing additional blood work. He returns today for an evaluation. Victor Sutton  HISTORY 03/27/15 (WILLIS): Victor Sutton is an 82 year old right-handed white male with a history of myasthenia gravis with pharyngeal features. The patient is gradually tapering off of the prednisone, he currently is on 7 mg daily, he will be going to 6 mg daily within the next 2 weeks. He has not noted any increased fatigue or other myasthenic symptoms coming down off of the  medication. He recently was treated for an Escherichia coli urinary tract infection. The patient denies any problems with chewing, speech, or swallowing. He does not have issues with ptosis or double vision. The patient is sleeping well at night, and he has a good energy level during the day. He is under a lot of stress as his wife has sarcoma, currently under treatment. The patient returns for an evaluation.   REVIEW OF SYSTEMS: Out of a complete 14 system review of symptoms, the patient complains only of the following symptoms, and all other reviewed systems are negative.  Cold intolerance, chills, bruise/bleed easily, snoring  ALLERGIES: Allergies  Allergen Reactions  . Ciprofloxacin Anaphylaxis    HOME MEDICATIONS: Outpatient Prescriptions Prior to Visit  Medication Sig Dispense Refill  . hydrochlorothiazide (HYDRODIURIL) 25 MG tablet TAKE 1 TABLET BY MOUTH DAILY. 90 tablet 1  . mirtazapine (REMERON) 15 MG tablet   6  . Multiple Vitamins-Minerals (ICAPS) CAPS Take 1 capsule by mouth 2 (two) times daily.    . mupirocin ointment (BACTROBAN) 2 % Apply 1 application topically as needed.   2  . pantoprazole (PROTONIX) 40 MG tablet Take 40 mg by mouth 2 (two) times daily.     . potassium chloride (K-DUR) 10 MEQ tablet Take 1 tablet (10 mEq total) by mouth 2 (two) times daily. 60 tablet 6  . Vitamin D, Ergocalciferol, (DRISDOL) 50000 UNITS CAPS capsule Take 50,000 Units by mouth every 7 (seven) days. ON FRIDAYS    . zoledronic acid (RECLAST) 5 MG/100ML SOLN injection Inject 5 mg into the vein once.  YEARLY    . mycophenolate (CELLCEPT) 250 MG capsule TAKE 2 CAPSULES BY MOUTH 2 TIMES DAILY. 120 capsule 3  . predniSONE (DELTASONE) 1 MG tablet Begin 4 tablets daily, taper by one tablet every 6 weeks. 120 tablet 3   No facility-administered medications prior to visit.    PAST MEDICAL HISTORY: Past Medical History  Diagnosis Date  . BPH (benign prostatic hyperplasia)   . Macular degeneration      (L) wet, (R) dry  . B12 deficiency     HIstory  . Polyneuritis ([place]) 1965    Resolved  . Leukoplakia 2001    Treated for  . Hypertension   . Iron deficiency anemia   . Cataract 2009    bilateral cataract surgery  . GERD (gastroesophageal reflux disease)   . Hyperlipidemia     not on medication  . Ulcer   . Shortness of breath   . Peripheral neuropathy (HCC)     Mild History of  . Hiatal hernia     hiatial hernia repair  02/08/13  . Cancer ([place])   . Dysphonia 03/16/2013  . Dysphagia, pharyngoesophageal phase 03/16/2013  . Myasthenia gravis ([place]) 03/21/2013  . Disturbance of salivary secretion 06/20/2013  . Adenomatous colon polyp   . Delayed gastric emptying   . Cameron ulcer   . Vitamin D deficiency     PAST SURGICAL HISTORY: Past Surgical History  Procedure Laterality Date  . Cholecystectomy    . Arthroscopic knee surgery      left  . Multiple oral surgeries  2002  . Tonsillectomy    . Cateract surgeries       bilateral  . Stomach surgery  2005    states his stomach had moved up toward his chest , some type of surgery performed  . Hernia repair      Laparoscopic ventral  . Hiatal hernia repair  2004  . Skin cancer excision  10/2012    right leg-shin area  . Hiatal hernia repair N/A 02/08/2013    Procedure:  REPAIR OFCURRENT HIATAL HERNIA, ;  Surgeon: Odis Hollingshead, MD;  Location: [facility];  Service: General;  Laterality: N/A;  . Upper gi endoscopy N/A 02/08/2013    Procedure: UPPER GI ENDOSCOPY;  Surgeon: Odis Hollingshead, MD;  Location: [facility];  Service: General;  Laterality: N/A;  . Laparoscopic lysis of adhesions N/A 02/08/2013    Procedure: LAPAROSCOPIC LYSIS OF ADHESIONS;  Surgeon: Odis Hollingshead, MD;  Location: [facility];  Service: General;  Laterality: N/A;  . Laparoscopic nissen fundoplication  [id number]    Procedure: LAPAROSCOPIC NISSEN FUNDOPLICATION;  Surgeon: Odis Hollingshead, MD;  Location: [facility];  Service: General;;  . Gastrostomy w/ feeding tube   [id number]    FAMILY HISTORY: Family History  Problem Relation Age of Onset  . Prostate cancer Paternal Grandfather   . Breast cancer Sister   . CAD Sister   . COPD Sister   . Colon cancer Son 17  . Diabetes Father   . Heart attack Father   . Colon cancer Son 27    SOCIAL HISTORY: Social History   Social History  . Marital Status: Married    Spouse Name: N/A  . Number of Children: 3  . Years of Education: 16   Occupational History  . Retired   .     Social History Main Topics  . Smoking status: Former Smoker    Quit date: 09/15/1976  . Smokeless tobacco: Never Used  Comment: Quit 1980  . Alcohol Use: No     Comment: Moderate alcohol, wine  . Drug Use: No  . Sexual Activity: Not on file   Other Topics Concern  . Not on file   Social History Narrative   Patient is right-handed.   Patient drinks caffeine occasionally.            PHYSICAL EXAM  Filed Vitals:   01/22/16 1245  BP: 126/71  Pulse: 68  Height: 5\' 10"  (1.778 m)  Weight: 169 lb 9.6 oz (76.93 kg)   Body mass index is 24.34 kg/(m^2).  Generalized: Well developed, in no acute distress   Neurological examination  Mentation: Alert oriented to time, place, history taking. Follows all commands speech and language fluent Cranial nerve II-XII: Pupils were equal round reactive to light. Extraocular movements were full, visual field were full on confrontational test. Facial sensation and strength were normal. Uvula tongue midline. Head turning and shoulder shrug  were normal and symmetric.No diplopia or ptosis. Puff cheeks weakness intact. Good eye closure. With superior gaze for 1 minute no diplopia or ptosis noted. Motor: The motor testing reveals 5 over 5 strength of all 4 extremities. Good symmetric motor tone is noted throughout. With arms abducted for 1 minute no weakness noted. Sensory: Sensory testing is intact to soft touch on all 4 extremities. No evidence of extinction is noted.    Coordination: Cerebellar testing reveals good finger-nose-finger and heel-to-shin bilaterally.  Gait and station: Gait is normal. Tandem gait is Unsteady. Romberg is negative. No drift is seen.  Reflexes: Deep tendon reflexes are symmetric and normal bilaterally.   DIAGNOSTIC DATA (LABS, IMAGING, TESTING) - I reviewed patient records, labs, notes, testing and imaging myself where available.  Lab Results  Component Value Date   WBC 4.7 09/26/2015   HGB 15.0 11/24/2014   HCT 44.9 09/26/2015   MCV 94 09/26/2015   PLT 247 09/26/2015      Component Value Date/Time   NA 138 12/10/2015 1029   NA 143 02/11/2014 1131   K 4.3 12/10/2015 1029   CL 95* 12/10/2015 1029   CO2 29 12/10/2015 1029   GLUCOSE 96 12/10/2015 1029   GLUCOSE 105* 02/11/2014 1131   BUN 9 12/10/2015 1029   BUN 13 02/11/2014 1131   CREATININE 0.72* 12/10/2015 1029   CALCIUM 9.7 12/10/2015 1029   PROT 6.1 09/26/2015 1407   PROT 6.9 02/23/2013 0431   ALBUMIN 3.9 09/26/2015 1407   ALBUMIN 2.5* 02/23/2013 0431   AST 27 09/26/2015 1407   ALT 17 09/26/2015 1407   ALKPHOS 38* 09/26/2015 1407   BILITOT 1.2 09/26/2015 1407   BILITOT 1.4* 07/25/2014 1239   GFRNONAA 85 12/10/2015 1029   GFRAA 98 12/10/2015 1029   Lab Results  Component Value Date   CHOL 118 02/14/2013   TRIG 79 02/21/2013   No results found for: HGBA1C Lab Results  Component Value Date   VITAMINB12 727 03/16/2013   Lab Results  Component Value Date   TSH 2.510 03/16/2013      ASSESSMENT AND PLAN 82 y.o. year old male  has a past medical history of BPH (benign prostatic hyperplasia); Macular degeneration; B12 deficiency; Polyneuritis ([place]) (1965); Leukoplakia (2001); Hypertension; Iron deficiency anemia; Cataract (2009); GERD (gastroesophageal reflux disease); Hyperlipidemia; Ulcer; Shortness of breath; Peripheral neuropathy ([place]); Hiatal hernia; Cancer ([place]); Dysphonia (03/16/2013); Dysphagia, pharyngoesophageal phase (03/16/2013); Myasthenia  gravis ([place]) (03/21/2013); Disturbance of salivary secretion (06/20/2013); Adenomatous colon polyp; Delayed gastric emptying; Cameron ulcer; and Vitamin  D deficiency. here with:  1. Myasthenia gravis  The patient's physical exam was unremarkable. He will remain off of prednisone at this time. The patient would like to decrease his CellCept. He will decrease to 250 mg in the morning and continue with 500 mg in the evening. If he tolerates this well in the future we will decrease to 250 mg twice a day. I have reviewed the symptoms of myasthenia as well as myasthenia crisis. The patient is aware if he begins to have difficulty breathing he should go to the emergency room. He will follow-up in 3 months or sooner if needed.     Ward Givens, MSN, NP-C 01/22/2016, 1:44 PM [facility] [address] [place], [place] [address] [phone]

## 2016-01-23 ENCOUNTER — Telehealth: Payer: Self-pay | Admitting: *Deleted

## 2016-01-23 LAB — CBC WITH DIFFERENTIAL/PLATELET
Basophils Absolute: 0 10*3/uL (ref 0.0–0.2)
Basos: 0 %
EOS (ABSOLUTE): 0.1 10*3/uL (ref 0.0–0.4)
Eos: 3 %
Hematocrit: 44.4 % (ref 37.5–51.0)
Hemoglobin: 14.6 g/dL (ref 12.6–17.7)
Immature Grans (Abs): 0 10*3/uL (ref 0.0–0.1)
Immature Granulocytes: 0 %
Lymphocytes Absolute: 1.4 10*3/uL (ref 0.7–3.1)
Lymphs: 28 %
MCH: 30.7 pg (ref 26.6–33.0)
MCHC: 32.9 g/dL (ref 31.5–35.7)
MCV: 93 fL (ref 79–97)
Monocytes Absolute: 0.5 10*3/uL (ref 0.1–0.9)
Monocytes: 10 %
Neutrophils Absolute: 3.1 10*3/uL (ref 1.4–7.0)
Neutrophils: 59 %
Platelets: 233 10*3/uL (ref 150–379)
RBC: 4.76 x10E6/uL (ref 4.14–5.80)
RDW: 13.6 % (ref 12.3–15.4)
WBC: 5.2 10*3/uL (ref 3.4–10.8)

## 2016-01-23 LAB — COMPREHENSIVE METABOLIC PANEL
ALT: 14 IU/L (ref 0–44)
AST: 28 IU/L (ref 0–40)
Albumin/Globulin Ratio: 1.8 (ref 1.2–2.2)
Albumin: 3.9 g/dL (ref 3.5–4.7)
Alkaline Phosphatase: 41 IU/L (ref 39–117)
BUN/Creatinine Ratio: 14 (ref 10–24)
BUN: 11 mg/dL (ref 8–27)
Bilirubin Total: 1.4 mg/dL — ABNORMAL HIGH (ref 0.0–1.2)
CO2: 26 mmol/L (ref 18–29)
Calcium: 9.1 mg/dL (ref 8.6–10.2)
Chloride: 100 mmol/L (ref 96–106)
Creatinine, Ser: 0.8 mg/dL (ref 0.76–1.27)
GFR calc Af Amer: 94 mL/min/{1.73_m2} (ref >59)
GFR calc non Af Amer: 81 mL/min/{1.73_m2} (ref >59)
Globulin, Total: 2.2 g/dL (ref 1.5–4.5)
Glucose: 91 mg/dL (ref 65–99)
Potassium: 3.9 mmol/L (ref 3.5–5.2)
Sodium: 142 mmol/L (ref 134–144)
Total Protein: 6.1 g/dL (ref 6.0–8.5)

## 2016-01-23 NOTE — Telephone Encounter (Signed)
LVM informing patient per Edman Circle, NP his lab results are okay. Left name and number for any questions.

## 2016-01-28 DIAGNOSIS — H52223 Regular astigmatism, bilateral: Secondary | ICD-10-CM | POA: Diagnosis not present

## 2016-01-29 ENCOUNTER — Other Ambulatory Visit: Payer: Self-pay | Admitting: Dermatology

## 2016-01-29 DIAGNOSIS — L570 Actinic keratosis: Secondary | ICD-10-CM | POA: Diagnosis not present

## 2016-01-29 DIAGNOSIS — C44329 Squamous cell carcinoma of skin of other parts of face: Secondary | ICD-10-CM | POA: Diagnosis not present

## 2016-01-30 ENCOUNTER — Other Ambulatory Visit: Payer: Medicare Other

## 2016-02-05 ENCOUNTER — Ambulatory Visit
Admission: RE | Admit: 2016-02-05 | Discharge: 2016-02-05 | Disposition: A | Discharge status: Home / Self Care | Payer: Medicare Other | Source: Ambulatory Visit | Attending: Internal Medicine | Admitting: Internal Medicine

## 2016-02-05 DIAGNOSIS — K862 Cyst of pancreas: Secondary | ICD-10-CM

## 2016-02-05 MED ORDER — GADOBENATE DIMEGLUMINE 529 MG/ML IV SOLN
15.0000 mL | Freq: Once | INTRAVENOUS | Status: AC | PRN
  Administered 2016-02-05: 15 mL via INTRAVENOUS

## 2016-03-11 DIAGNOSIS — M810 Age-related osteoporosis without current pathological fracture: Secondary | ICD-10-CM | POA: Diagnosis not present

## 2016-03-11 DIAGNOSIS — Z79899 Other long term (current) drug therapy: Secondary | ICD-10-CM | POA: Diagnosis not present

## 2016-03-20 ENCOUNTER — Other Ambulatory Visit (HOSPITAL_COMMUNITY): Payer: Self-pay | Admitting: *Deleted

## 2016-03-21 ENCOUNTER — Ambulatory Visit (HOSPITAL_COMMUNITY)
Admission: RE | Admit: 2016-03-21 | Discharge: 2016-03-21 | Disposition: A | Discharge status: Home / Self Care | Payer: Medicare Other | Source: Ambulatory Visit | Attending: Internal Medicine | Admitting: Internal Medicine

## 2016-03-21 DIAGNOSIS — M810 Age-related osteoporosis without current pathological fracture: Secondary | ICD-10-CM | POA: Insufficient documentation

## 2016-03-21 DIAGNOSIS — Z6824 Body mass index (BMI) 24.0-24.9, adult: Secondary | ICD-10-CM | POA: Diagnosis not present

## 2016-03-21 DIAGNOSIS — L0100 Impetigo, unspecified: Secondary | ICD-10-CM | POA: Diagnosis not present

## 2016-03-21 DIAGNOSIS — B001 Herpesviral vesicular dermatitis: Secondary | ICD-10-CM | POA: Diagnosis not present

## 2016-03-21 MED ORDER — ZOLEDRONIC ACID 5 MG/100ML IV SOLN
INTRAVENOUS | Status: AC
  Administered 2016-03-21: 5 mg
  Filled 2016-03-21: qty 100

## 2016-04-07 ENCOUNTER — Other Ambulatory Visit: Payer: Self-pay | Admitting: *Deleted

## 2016-04-07 MED ORDER — HYDROCHLOROTHIAZIDE 25 MG PO TABS: 25.0000 mg | ORAL_TABLET | Freq: Every day | ORAL | 1 refills | Status: AC

## 2016-04-08 DIAGNOSIS — Z961 Presence of intraocular lens: Secondary | ICD-10-CM | POA: Diagnosis not present

## 2016-04-08 DIAGNOSIS — H35341 Macular cyst, hole, or pseudohole, right eye: Secondary | ICD-10-CM | POA: Diagnosis not present

## 2016-04-08 DIAGNOSIS — H353190 Nonexudative age-related macular degeneration, unspecified eye, stage unspecified: Secondary | ICD-10-CM | POA: Diagnosis not present

## 2016-04-14 DIAGNOSIS — K862 Cyst of pancreas: Secondary | ICD-10-CM | POA: Diagnosis not present

## 2016-04-14 DIAGNOSIS — K3184 Gastroparesis: Secondary | ICD-10-CM | POA: Diagnosis not present

## 2016-04-14 DIAGNOSIS — Z1389 Encounter for screening for other disorder: Secondary | ICD-10-CM | POA: Diagnosis not present

## 2016-04-14 DIAGNOSIS — Z6824 Body mass index (BMI) 24.0-24.9, adult: Secondary | ICD-10-CM | POA: Diagnosis not present

## 2016-04-14 DIAGNOSIS — I10 Essential (primary) hypertension: Secondary | ICD-10-CM | POA: Diagnosis not present

## 2016-04-14 DIAGNOSIS — M810 Age-related osteoporosis without current pathological fracture: Secondary | ICD-10-CM | POA: Diagnosis not present

## 2016-04-14 DIAGNOSIS — G7000 Myasthenia gravis without (acute) exacerbation: Secondary | ICD-10-CM | POA: Diagnosis not present

## 2016-04-14 DIAGNOSIS — K5909 Other constipation: Secondary | ICD-10-CM | POA: Diagnosis not present

## 2016-04-29 ENCOUNTER — Telehealth: Payer: Self-pay | Admitting: Adult Health

## 2016-04-29 ENCOUNTER — Ambulatory Visit (INDEPENDENT_AMBULATORY_CARE_PROVIDER_SITE_OTHER): Payer: Medicare Other | Admitting: Adult Health

## 2016-04-29 ENCOUNTER — Encounter: Payer: Self-pay | Admitting: Adult Health

## 2016-04-29 VITALS — BP 113/72 | HR 62 | Ht 70.0 in | Wt 167.6 lb

## 2016-04-29 DIAGNOSIS — G7000 Myasthenia gravis without (acute) exacerbation: Secondary | ICD-10-CM

## 2016-04-29 DIAGNOSIS — Z5181 Encounter for therapeutic drug level monitoring: Secondary | ICD-10-CM

## 2016-04-29 MED ORDER — MYCOPHENOLATE MOFETIL 250 MG PO CAPS: 250.0000 mg | ORAL_CAPSULE | Freq: Two times a day (BID) | ORAL | 5 refills | Status: DC

## 2016-04-29 NOTE — Telephone Encounter (Signed)
Theresa/ [facility] called and needs clarification on mycophenolate (CELLCEPT) 250 MG capsule. Please call [phone]

## 2016-04-29 NOTE — Progress Notes (Signed)
PATIENT: Victor Sutton DOB: 28-Mar-1928  REASON FOR VISIT: follow up- myasthenia gravis HISTORY FROM: patient  HISTORY OF PRESENT ILLNESS: Mr. Pangelinan is an 82 year old male with a history of myasthenia gravis with pharyngeal features. He returns today for follow-up. At the last visit patient's CellCept was decreased to 250 mg in the morning and 500 mg in the evening. He reports that he's been tolerating this well. Denies any trouble swallowing or difficulty chewing. Denies diplopia or proptosis. Denies any weakness in the upper or lower extremities. Denies any trouble breathing. His wife reports there was one occasion last week that she felt the left eyelid was drooping however it resolved very quickly. Patient states that he did not notice this. He returns today for an evaluation.  HISTORY 01/22/16: Mr. Ostwald is an 82 year old male with a history of myasthenia gravis with pharyngeal features. He returns today for follow-up. The patient is no longer on prednisone and has been doing well. He continues to take CellCept 500 mg twice a day. He denies any trouble swallowing or difficulty chewing. Denies diplopia and ptosis. Denies any weakness in the extremities. The patient does report that he had an MRI of the abdomen that showed a cyst on the pancreas and adrenal glands. He has a repeat MRI next week. Overall he feels that he is doing well. He returns today for an evaluation.  HISTORY 09/26/15 (MM): Mr. Kizewski is a 82 year old male with a history of myasthenia gravis with pharyngeal features. He returns today for follow-up. The patient is currently on 2 mg of prednisone. He is been doing a taper for every 6 weeks. The patient remains on CellCept 500 mg twice a day. The patient denies any symptoms associated with myasthenia gravis. Denies any difficulty chewing or swallowing. Denies any weakness in the upper or lower extremities. Denies difficulty breathing. Denies ptosis and diplopia. He denies any  new neurological symptoms. Patient reports that he's had an elevated PSA in the past. According to the patient as well as checked by Dr. Jannifer Franklin. The patient is requesting that we recheck this lab work today if withdrawing additional blood work. He returns today for an evaluation. Marland Kitchen  HISTORY 03/27/15 (WILLIS): Mr. Hausladen is an 82 year old right-handed white male with a history of myasthenia gravis with pharyngeal features. The patient is gradually tapering off of the prednisone, he currently is on 7 mg daily, he will be going to 6 mg daily within the next 2 weeks. He has not noted any increased fatigue or other myasthenic symptoms coming down off of the medication. He recently was treated for an Escherichia coli urinary tract infection. The patient denies any problems with chewing, speech, or swallowing. He does not have issues with ptosis or double vision. The patient is sleeping well at night, and he has a good energy level during the day. He is under a lot of stress as his wife has sarcoma, currently under treatment. The patient returns for an evaluation.  REVIEW OF SYSTEMS: Out of a complete 14 system review of symptoms, the patient complains only of the following symptoms, and all other reviewed systems are negative.  Appetite change, chills, fatigue, cold intolerance, bruise/bleed easily, weakness  ALLERGIES: Allergies  Allergen Reactions  . Ciprofloxacin Anaphylaxis    HOME MEDICATIONS: Outpatient Medications Prior to Visit  Medication Sig Dispense Refill  . hydrochlorothiazide (HYDRODIURIL) 25 MG tablet Take 1 tablet (25 mg total) by mouth daily. 90 tablet 1  . mirtazapine (REMERON) 15 MG tablet  6  . Multiple Vitamins-Minerals (ICAPS) CAPS Take 1 capsule by mouth 2 (two) times daily.    . mupirocin ointment (BACTROBAN) 2 % Apply 1 application topically as needed.   2  . mycophenolate (CELLCEPT) 250 MG capsule TAKE 1 CAPSULE BY MOUTH in the AM and 2 capsules in the PM. 90 capsule 3  .  pantoprazole (PROTONIX) 40 MG tablet Take 40 mg by mouth 2 (two) times daily.     . potassium chloride (K-DUR) 10 MEQ tablet Take 1 tablet (10 mEq total) by mouth 2 (two) times daily. 60 tablet 6  . Vitamin D, Ergocalciferol, (DRISDOL) 50000 UNITS CAPS capsule Take 50,000 Units by mouth every 7 (seven) days. ON FRIDAYS    . zoledronic acid (RECLAST) 5 MG/100ML SOLN injection Inject 5 mg into the vein once. YEARLY     No facility-administered medications prior to visit.     PAST MEDICAL HISTORY: Past Medical History:  Diagnosis Date  . Adenomatous colon polyp   . B12 deficiency    HIstory  . BPH (benign prostatic hyperplasia)   . Cameron ulcer   . Cancer ([place])   . Cataract 2009   bilateral cataract surgery  . Delayed gastric emptying   . Disturbance of salivary secretion 06/20/2013  . Dysphagia, pharyngoesophageal phase 03/16/2013  . Dysphonia 03/16/2013  . GERD (gastroesophageal reflux disease)   . Hiatal hernia    hiatial hernia repair  02/08/13  . Hyperlipidemia    not on medication  . Hypertension   . Iron deficiency anemia   . Leukoplakia 2001   Treated for  . Macular degeneration    (L) wet, (R) dry  . Myasthenia gravis ([place]) 03/21/2013  . Peripheral neuropathy (HCC)    Mild History of  . Polyneuritis ([place]) 1965   Resolved  . Shortness of breath   . Ulcer   . Vitamin D deficiency     PAST SURGICAL HISTORY: Past Surgical History:  Procedure Laterality Date  . Arthroscopic knee surgery     left  . cateract surgeries      bilateral  . CHOLECYSTECTOMY    . GASTROSTOMY W/ FEEDING TUBE  [id number]  . HERNIA REPAIR     Laparoscopic ventral  . HIATAL HERNIA REPAIR  2004  . HIATAL HERNIA REPAIR N/A 02/08/2013   Procedure:  REPAIR OFCURRENT HIATAL HERNIA, ;  Surgeon: Odis Hollingshead, MD;  [facility];  Service: General;  Laterality: N/A;  . LAPAROSCOPIC LYSIS OF ADHESIONS N/A 02/08/2013   Procedure: LAPAROSCOPIC LYSIS OF ADHESIONS;  Surgeon: Odis Hollingshead, MD;   [facility];  Service: General;  Laterality: N/A;  . LAPAROSCOPIC NISSEN FUNDOPLICATION  [id number]   Procedure: LAPAROSCOPIC NISSEN FUNDOPLICATION;  Surgeon: Odis Hollingshead, MD;  [facility];  Service: General;;  . Multiple oral surgeries  2002  . SKIN CANCER EXCISION  10/2012   right leg-shin area  . STOMACH SURGERY  2005   states his stomach had moved up toward his chest , some type of surgery performed  . TONSILLECTOMY    . UPPER GI ENDOSCOPY N/A 02/08/2013   Procedure: UPPER GI ENDOSCOPY;  Surgeon: Odis Hollingshead, MD;  [facility];  Service: General;  Laterality: N/A;    FAMILY HISTORY: Family History  Problem Relation Age of Onset  . Prostate cancer Paternal Grandfather   . Breast cancer Sister   . CAD Sister   . COPD Sister   . Colon cancer Son 36  . Diabetes Father   .  Heart attack Father   . Colon cancer Son 23    SOCIAL HISTORY: Social History   Social History  . Marital status: Married    Spouse name: N/A  . Number of children: 3  . Years of education: 14   Occupational History  . Retired   .  Retired   Social History Main Topics  . Smoking status: Former Smoker    Quit date: 09/15/1976  . Smokeless tobacco: Never Used     Comment: Quit 1980  . Alcohol use No     Comment: Moderate alcohol, wine  . Drug use: No  . Sexual activity: Not on file   Other Topics Concern  . Not on file   Social History Narrative   Patient is right-handed.   Patient drinks caffeine occasionally.            PHYSICAL EXAM  Vitals:   04/29/16 1125  BP: 113/72  Pulse: 62  Weight: 167 lb 9.6 oz (76 kg)  Height: 5\' 10"  (1.778 m)   Body mass index is 24.05 kg/m.  Generalized: Well developed, in no acute distress   Neurological examination  Mentation: Alert oriented to time, place, history taking. Follows all commands speech and language fluent Cranial nerve II-XII: Pupils were equal round reactive to light. Extraocular movements were full,  visual field were full on confrontational test. Facial sensation and strength were normal. Uvula tongue midline. Head turning and shoulder shrug  were normal and symmetric.Superior gaze for 1 minute patient reports no diplopia. Mild ptosis noted on the left. Motor: The motor testing reveals 5 over 5 strength of all 4 extremities. Good symmetric motor tone is noted throughout. With arms abducted for 1 minute no weakness was noted. Sensory: Sensory testing is intact to soft touch on all 4 extremities. No evidence of extinction is noted.  Coordination: Cerebellar testing reveals good finger-nose-finger and heel-to-shin bilaterally.  Gait and station: Gait is normal.  Reflexes: Deep tendon reflexes are symmetric and normal bilaterally.   DIAGNOSTIC DATA (LABS, IMAGING, TESTING) - I reviewed patient records, labs, notes, testing and imaging myself where available.  Lab Results  Component Value Date   WBC 5.2 01/22/2016   HGB 15.0 11/24/2014   HCT 44.4 01/22/2016   MCV 93 01/22/2016   PLT 233 01/22/2016      Component Value Date/Time   NA 142 01/22/2016 1413   K 3.9 01/22/2016 1413   CL 100 01/22/2016 1413   CO2 26 01/22/2016 1413   GLUCOSE 91 01/22/2016 1413   GLUCOSE 105 (H) 02/11/2014 1131   BUN 11 01/22/2016 1413   CREATININE 0.80 01/22/2016 1413   CALCIUM 9.1 01/22/2016 1413   PROT 6.1 01/22/2016 1413   ALBUMIN 3.9 01/22/2016 1413   AST 28 01/22/2016 1413   ALT 14 01/22/2016 1413   ALKPHOS 41 01/22/2016 1413   BILITOT 1.4 (H) 01/22/2016 1413   GFRNONAA 81 01/22/2016 1413   GFRAA 94 01/22/2016 1413        ASSESSMENT AND PLAN 82 y.o. year old male  has a past medical history of Adenomatous colon polyp; B12 deficiency; BPH (benign prostatic hyperplasia); Cameron ulcer; Cancer ([place]); Cataract (2009); Delayed gastric emptying; Disturbance of salivary secretion (06/20/2013); Dysphagia, pharyngoesophageal phase (03/16/2013); Dysphonia (03/16/2013); GERD (gastroesophageal reflux  disease); Hiatal hernia; Hyperlipidemia; Hypertension; Iron deficiency anemia; Leukoplakia (2001); Macular degeneration; Myasthenia gravis ([place]) (03/21/2013); Peripheral neuropathy ([place]); Polyneuritis ([place]) (1965); Shortness of breath; Ulcer; and Vitamin D deficiency. here with:  1. Myasthenia gravis  We  will decrease CellCept 250 mg twice a day. I will check blood work today. Advised patient that if he begins have any symptoms he should let us know. Also reviewed the symptoms of myasthenia crisis. patient voiced understanding. He will follow-up in 6 months with Dr. Jannifer Franklin.    Ward Givens, MSN, NP-C 04/29/2016, 11:10 AM [facility] [address], [place], [place] [address] [phone]

## 2016-04-29 NOTE — Patient Instructions (Signed)
Decrease Cellcept to 250 mg twice a day Blood work today If your symptoms worsen or you develop new symptoms please let us know.   Myasthenia Gravis Myasthenia gravis (MG) means severe weakness. It is a long-term (chronic) condition that causes weakness in the muscles you can control (voluntary muscles). MG can affect any voluntary muscle. The muscles most often affected are the ones that control:   Eye movement.  Facial movements.  Swallowing. MG is an autoimmune disease, which means that your body's defense system (immune system) attacks healthy parts of your body instead of germs and other things that make you sick. When you have MG, your immune system makes proteins (antibodies) that block the chemical (acetylcholine) your body needs to send nerve signals to your muscles. This causes muscle weakness. CAUSES  The exact cause of MG is unknown. One possible cause is an enlarged thymus gland, which is located under your breastbone.  SIGNS AND SYMPTOMS The earliest symptom of MG is muscle weakness that gets worse with activity and gets better after rest. Other symptoms of MG may include:  Drooping eyelids.  Double vision.  Loss of facial expression.  Trouble chewing and swallowing.  Slurred speech.  A waddling walk.  Weakness of the arms, hands, and legs. Trouble breathing is the most dangerous symptom of MG. Sudden and severe difficulty breathing (myasthenic crisis) may require emergency breathing support. This symptom sometimes happens after:   Infection.  Fever.  Drug reaction. DIAGNOSIS  It can be hard to diagnose MG because muscle weakness is a common symptom in many conditions. Your health care provider will do a physical exam. You may also have tests that will help make a diagnosis. These may include:  A blood test.  A test using the medicine edrophonium. This medicine increases muscle strength by slowing the breakdown of acetylcholine.  Tests to measure nerve  conduction to muscle (electromyography).  An imaging study of the chest (CT or MRI). TREATMENT  Treatment can improve muscle strength. Sometimes symptoms of MG go away for a while (remission) and you can stop treatment. Possible treatments include:  Medicine.  Removal of the thymus gland (thymectomy). This may result in a long remission for some people. HOME CARE INSTRUCTIONS  Take medicines only as directed by your health care provider.  Get plenty of rest to conserve your energy.  Take frequent breaks to rest your eyes.  Maintain a healthy diet and a healthy weight.  Do not use any tobacco products including cigarettes, chewing tobacco, or electronic cigarettes. If you need help quitting, ask your health care provider.  Keep all follow-up visits as directed by your health care provider. This is important. SEEK MEDICAL CARE IF:  Your symptoms get worse after a fever or infection.  You have a reaction to a medicine you are taking.  Your symptoms change or get worse. SEEK IMMEDIATE MEDICAL CARE IF: You have trouble breathing.    This information is not intended to replace advice given to you by your health care provider. Make sure you discuss any questions you have with your health care provider.   Document Released: 12/08/2000 Document Revised: 09/22/2014 Document Reviewed: 11/02/2013 Elsevier Interactive Patient Education [employer].

## 2016-04-29 NOTE — Progress Notes (Signed)
I agree with the assessment and plan as directed by NP .   Delva Derden, MD  

## 2016-04-29 NOTE — Addendum Note (Signed)
Addended by: Monte Fantasia on: 04/29/2016 02:10 PM   Modules accepted: Orders

## 2016-04-29 NOTE — Telephone Encounter (Signed)
Returned call to pharmacy, verified decrease in CellCept to 250 mg twice a day.

## 2016-04-30 ENCOUNTER — Telehealth: Payer: Self-pay | Admitting: *Deleted

## 2016-04-30 LAB — CBC WITH DIFFERENTIAL/PLATELET
Basophils Absolute: 0 10*3/uL (ref 0.0–0.2)
Basos: 0 %
EOS (ABSOLUTE): 0.2 10*3/uL (ref 0.0–0.4)
Eos: 4 %
Hematocrit: 44 % (ref 37.5–51.0)
Hemoglobin: 14.7 g/dL (ref 12.6–17.7)
Immature Grans (Abs): 0 10*3/uL (ref 0.0–0.1)
Immature Granulocytes: 0 %
Lymphocytes Absolute: 1.2 10*3/uL (ref 0.7–3.1)
Lymphs: 23 %
MCH: 31.5 pg (ref 26.6–33.0)
MCHC: 33.4 g/dL (ref 31.5–35.7)
MCV: 94 fL (ref 79–97)
Monocytes Absolute: 0.5 10*3/uL (ref 0.1–0.9)
Monocytes: 10 %
Neutrophils Absolute: 3.1 10*3/uL (ref 1.4–7.0)
Neutrophils: 63 %
Platelets: 270 10*3/uL (ref 150–379)
RBC: 4.66 x10E6/uL (ref 4.14–5.80)
RDW: 13.9 % (ref 12.3–15.4)
WBC: 5.1 10*3/uL (ref 3.4–10.8)

## 2016-04-30 LAB — COMPREHENSIVE METABOLIC PANEL
ALT: 12 IU/L (ref 0–44)
AST: 21 IU/L (ref 0–40)
Albumin/Globulin Ratio: 1.6 (ref 1.2–2.2)
Albumin: 3.9 g/dL (ref 3.5–4.7)
Alkaline Phosphatase: 43 IU/L (ref 39–117)
BUN/Creatinine Ratio: 11 (ref 10–24)
BUN: 8 mg/dL (ref 8–27)
Bilirubin Total: 1 mg/dL (ref 0.0–1.2)
CO2: 29 mmol/L (ref 18–29)
Calcium: 9.3 mg/dL (ref 8.6–10.2)
Chloride: 98 mmol/L (ref 96–106)
Creatinine, Ser: 0.75 mg/dL — ABNORMAL LOW (ref 0.76–1.27)
GFR calc Af Amer: 97 mL/min/{1.73_m2} (ref >59)
GFR calc non Af Amer: 84 mL/min/{1.73_m2} (ref >59)
Globulin, Total: 2.5 g/dL (ref 1.5–4.5)
Glucose: 88 mg/dL (ref 65–99)
Potassium: 5.1 mmol/L (ref 3.5–5.2)
Sodium: 139 mmol/L (ref 134–144)
Total Protein: 6.4 g/dL (ref 6.0–8.5)

## 2016-04-30 NOTE — Telephone Encounter (Signed)
Spoke to pt and relayed that lab results unremarkable.   He verbalized understanding.

## 2016-04-30 NOTE — Telephone Encounter (Signed)
-----   Message from Ward Givens, NP sent at 04/30/2016  7:35 AM EDT ----- Lab work unremarkable. Please call patient. Thanks!

## 2016-06-28 DIAGNOSIS — Z23 Encounter for immunization: Secondary | ICD-10-CM | POA: Diagnosis not present

## 2016-07-16 ENCOUNTER — Other Ambulatory Visit: Payer: Self-pay | Admitting: Dermatology

## 2016-07-16 DIAGNOSIS — D485 Neoplasm of uncertain behavior of skin: Secondary | ICD-10-CM | POA: Diagnosis not present

## 2016-07-16 DIAGNOSIS — D0439 Carcinoma in situ of skin of other parts of face: Secondary | ICD-10-CM | POA: Diagnosis not present

## 2016-07-16 DIAGNOSIS — L820 Inflamed seborrheic keratosis: Secondary | ICD-10-CM | POA: Diagnosis not present

## 2016-08-28 DIAGNOSIS — D0439 Carcinoma in situ of skin of other parts of face: Secondary | ICD-10-CM | POA: Diagnosis not present

## 2016-10-08 DIAGNOSIS — Z125 Encounter for screening for malignant neoplasm of prostate: Secondary | ICD-10-CM | POA: Diagnosis not present

## 2016-10-08 DIAGNOSIS — Z0000 Encounter for general adult medical examination without abnormal findings: Secondary | ICD-10-CM | POA: Diagnosis not present

## 2016-10-08 DIAGNOSIS — I10 Essential (primary) hypertension: Secondary | ICD-10-CM | POA: Diagnosis not present

## 2016-10-08 DIAGNOSIS — E538 Deficiency of other specified B group vitamins: Secondary | ICD-10-CM | POA: Diagnosis not present

## 2016-10-08 DIAGNOSIS — M810 Age-related osteoporosis without current pathological fracture: Secondary | ICD-10-CM | POA: Diagnosis not present

## 2016-10-08 DIAGNOSIS — R8299 Other abnormal findings in urine: Secondary | ICD-10-CM | POA: Diagnosis not present

## 2016-10-15 DIAGNOSIS — K3184 Gastroparesis: Secondary | ICD-10-CM | POA: Diagnosis not present

## 2016-10-15 DIAGNOSIS — G7000 Myasthenia gravis without (acute) exacerbation: Secondary | ICD-10-CM | POA: Diagnosis not present

## 2016-10-15 DIAGNOSIS — M2669 Other specified disorders of temporomandibular joint: Secondary | ICD-10-CM | POA: Diagnosis not present

## 2016-10-15 DIAGNOSIS — K5909 Other constipation: Secondary | ICD-10-CM | POA: Diagnosis not present

## 2016-10-15 DIAGNOSIS — I10 Essential (primary) hypertension: Secondary | ICD-10-CM | POA: Diagnosis not present

## 2016-10-15 DIAGNOSIS — I2510 Atherosclerotic heart disease of native coronary artery without angina pectoris: Secondary | ICD-10-CM | POA: Diagnosis not present

## 2016-10-15 DIAGNOSIS — M810 Age-related osteoporosis without current pathological fracture: Secondary | ICD-10-CM | POA: Diagnosis not present

## 2016-10-15 DIAGNOSIS — Z6824 Body mass index (BMI) 24.0-24.9, adult: Secondary | ICD-10-CM | POA: Diagnosis not present

## 2016-10-15 DIAGNOSIS — Z0000 Encounter for general adult medical examination without abnormal findings: Secondary | ICD-10-CM | POA: Diagnosis not present

## 2016-10-21 DIAGNOSIS — Z961 Presence of intraocular lens: Secondary | ICD-10-CM | POA: Diagnosis not present

## 2016-10-21 DIAGNOSIS — H35341 Macular cyst, hole, or pseudohole, right eye: Secondary | ICD-10-CM | POA: Diagnosis not present

## 2016-10-21 DIAGNOSIS — H353190 Nonexudative age-related macular degeneration, unspecified eye, stage unspecified: Secondary | ICD-10-CM | POA: Diagnosis not present

## 2016-10-30 ENCOUNTER — Encounter: Payer: Self-pay | Admitting: Neurology

## 2016-10-30 ENCOUNTER — Ambulatory Visit (INDEPENDENT_AMBULATORY_CARE_PROVIDER_SITE_OTHER): Payer: Medicare Other | Admitting: Neurology

## 2016-10-30 VITALS — BP 120/71 | HR 64 | Wt 166.0 lb

## 2016-10-30 DIAGNOSIS — G7000 Myasthenia gravis without (acute) exacerbation: Secondary | ICD-10-CM

## 2016-10-30 MED ORDER — PYRIDOSTIGMINE BROMIDE 60 MG PO TABS: 30.0000 mg | ORAL_TABLET | Freq: Three times a day (TID) | ORAL | 3 refills | Status: DC

## 2016-10-30 MED ORDER — MYCOPHENOLATE MOFETIL 250 MG PO CAPS: 250.0000 mg | ORAL_CAPSULE | Freq: Two times a day (BID) | ORAL | 3 refills | Status: DC

## 2016-10-30 NOTE — Progress Notes (Signed)
Reason for visit: Myasthenia gravis  TARICK STANCIL is an 83 y.o. male  History of present illness:  Mr. Pritchard is an 83 year old right-handed white male with a history of myasthenia gravis mainly with pharyngeal features. The patient has done very well on low-dose CellCept taking 250 mg twice daily, he has been able to come off of the prednisone completely. He is bothered by gastroparesis, and symptoms of irritable bowel. He is on Linzess for this without complete control. The patient denies any issues with chewing or swallowing. He has not had any double vision or ptosis of the eyes, he does not report any weakness of the extremities. He returns to this office for an evaluation. He claims that he has had annual blood work done through his primary care physician 2 weeks ago.   Past Medical History:  Diagnosis Date  . Adenomatous colon polyp   . B12 deficiency    [place]  . BPH (benign prostatic hyperplasia)   . [place] ulcer   . Cancer ([place])   . Cataract 2009   bilateral cataract surgery  . Delayed gastric emptying   . Disturbance of salivary secretion 06/20/2013  . Dysphagia, pharyngoesophageal phase 03/16/2013  . Dysphonia 03/16/2013  . GERD (gastroesophageal reflux disease)   . Hiatal hernia    hiatial hernia repair  02/08/13  . Hyperlipidemia    not on medication  . Hypertension   . Iron deficiency anemia   . Leukoplakia 2001   Treated for  . Macular degeneration    (L) wet, (R) dry  . Myasthenia gravis ([place]) 03/21/2013  . Peripheral neuropathy ([facility])    Mild History of  . Polyneuritis ([place]) 1965   Resolved  . Shortness of breath   . Ulcer ([place])   . Vitamin D deficiency     Past Surgical History:  Procedure Laterality Date  . Arthroscopic knee surgery     left  . cateract surgeries      bilateral  . CHOLECYSTECTOMY    . GASTROSTOMY W/ FEEDING TUBE  [id number]  . HERNIA REPAIR     Laparoscopic ventral  . HIATAL HERNIA REPAIR  2004  . HIATAL HERNIA REPAIR N/A  02/08/2013   Procedure:  REPAIR OFCURRENT HIATAL HERNIA, ;  Surgeon: Odis Hollingshead, MD;  Location: [facility];  Service: General;  Laterality: N/A;  . LAPAROSCOPIC LYSIS OF ADHESIONS N/A 02/08/2013   Procedure: LAPAROSCOPIC LYSIS OF ADHESIONS;  Surgeon: Odis Hollingshead, MD;  Location: [facility];  Service: General;  Laterality: N/A;  . LAPAROSCOPIC NISSEN FUNDOPLICATION  [id number]   Procedure: LAPAROSCOPIC NISSEN FUNDOPLICATION;  Surgeon: Odis Hollingshead, MD;  Location: [facility];  Service: General;;  . Multiple oral surgeries  2002  . SKIN CANCER EXCISION  10/2012   right leg-shin area  . STOMACH SURGERY  2005   states his stomach had moved up toward his chest , some type of surgery performed  . TONSILLECTOMY    . UPPER GI ENDOSCOPY N/A 02/08/2013   Procedure: UPPER GI ENDOSCOPY;  Surgeon: Odis Hollingshead, MD;  Location: [facility];  Service: General;  Laterality: N/A;    Family History  Problem Relation Age of Onset  . Breast cancer Sister   . CAD Sister   . COPD Sister   . Diabetes Father   . Heart attack Father   . Colon cancer Son 31  . Prostate cancer Paternal Grandfather   . Colon cancer Son 59    Social history:  reports  that he quit smoking about 40 years ago. He has never used smokeless tobacco. He reports that he does not drink alcohol or use drugs.    Allergies  Allergen Reactions  . Ciprofloxacin Anaphylaxis    Medications:  Prior to Admission medications   Medication Sig Start Date End Date Taking? Authorizing Provider  hydrochlorothiazide (HYDRODIURIL) 25 MG tablet Take 1 tablet (25 mg total) by mouth daily. 04/07/16  Yes Kathrynn Ducking, MD  LINZESS 145 MCG CAPS capsule 145 mcg daily. 10/16/16  Yes Historical Provider, MD  mirtazapine (REMERON) 15 MG tablet  09/03/15  Yes Historical Provider, MD  Multiple Vitamins-Minerals (ICAPS) CAPS Take 1 capsule by mouth 2 (two) times daily.   Yes Historical Provider, MD  multivitamin-lutein (OCUVITE-LUTEIN) CAPS capsule Take 1  capsule by mouth daily.   Yes Historical Provider, MD  mupirocin ointment (BACTROBAN) 2 % Apply 1 application topically as needed.  09/19/14  Yes Historical Provider, MD  mycophenolate (CELLCEPT) 250 MG capsule Take 1 capsule (250 mg total) by mouth 2 (two) times daily. 10/30/16  Yes Kathrynn Ducking, MD  pantoprazole (PROTONIX) 40 MG tablet Take 40 mg by mouth 2 (two) times daily.  09/27/13  Yes Historical Provider, MD  potassium chloride (K-DUR) 10 MEQ tablet Take 1 tablet (10 mEq total) by mouth 2 (two) times daily. 11/06/15  Yes Kathrynn Ducking, MD  UNABLE TO FIND Med Name: zeathanthin 200mg  daily   Yes Historical Provider, MD  Vitamin D, Ergocalciferol, (DRISDOL) 50000 UNITS CAPS capsule Take 50,000 Units by mouth every 7 (seven) days. ON FRIDAYS   Yes Historical Provider, MD  zoledronic acid (RECLAST) 5 MG/100ML SOLN injection Inject 5 mg into the vein once. YEARLY   Yes Historical Provider, MD  pyridostigmine (MESTINON) 60 MG tablet Take 0.5 tablets (30 mg total) by mouth 3 (three) times daily. 10/30/16   Kathrynn Ducking, MD    ROS:  Out of a complete 14 system review of symptoms, the patient complains only of the following symptoms, and all other reviewed systems are negative.  Fatigue loss of vision Leg swelling Abdominal pain, constipation, diarrhea Daytime sleepiness, snoring Bruising easily  Blood pressure 120/71, pulse 64, weight 166 lb (75.3 kg).  Physical Exam  General: The patient is alert and cooperative at the time of the examination.  Skin: 2+ edema at the ankles is noted bilaterally.   Neurologic Exam  Mental status: The patient is alert and oriented x 3 at the time of the examination. The patient has apparent normal recent and remote memory, with an apparently normal attention span and concentration ability.   Cranial nerves: Facial symmetry is present. Speech is normal, no aphasia or dysarthria is noted. Extraocular movements are full. Visual fields are  full.  Motor: The patient has good strength in all 4 extremities.  Sensory examination: Soft touch sensation is symmetric on the face, arms, and legs.  Coordination: The patient has good finger-nose-finger and heel-to-shin bilaterally.  Gait and station: The patient has a normal gait. Tandem gait is unsteady. Romberg is negative. No drift is seen.  Reflexes: Deep tendon reflexes are symmetric.   Assessment/Plan:   1. Myasthenia gravis with residual features  The patient is doing quite well with his myasthenia. He does have gastroparesis, it is possible that Mestinon may help this. The patient will be given a trial on low-dose Mestinon taking 30 mg 3 times daily. If this is helpful, the patient will call for further refills of the medication. He will  follow-up in 6 months. We will check blood work again in 3 months. A prescription for the CellCept was sent in.  Jill Alexanders MD 10/30/2016 10:12 AM  [facility] [address] [place], [place] [address]  Phone [phone] Fax [phone]

## 2016-11-05 ENCOUNTER — Telehealth: Payer: Self-pay | Admitting: Neurology

## 2016-11-05 NOTE — Telephone Encounter (Signed)
I received blood work results done by the primary care physician on 03/08/2017. The patient has a sodium level 138, potassium 3.9, chloride 100, CO2 28, calcium 8.8, total protein 6.4, albumin 3.4, liver profile is normal, total bilirubin is slightly elevated at 2.2. White blood count slightly low at 3.9, hemoglobin of 14.7, hematocrit 47.5, MCV 100.0, platelets 246. TSH of 1.96, PSA of 0.514, vitamin D level of 35.1.

## 2016-11-27 DIAGNOSIS — Z1212 Encounter for screening for malignant neoplasm of rectum: Secondary | ICD-10-CM | POA: Diagnosis not present

## 2017-01-08 ENCOUNTER — Other Ambulatory Visit: Payer: Self-pay | Admitting: Gastroenterology

## 2017-01-27 ENCOUNTER — Telehealth: Payer: Self-pay | Admitting: Neurology

## 2017-01-27 DIAGNOSIS — Z5181 Encounter for therapeutic drug level monitoring: Secondary | ICD-10-CM

## 2017-01-27 NOTE — Telephone Encounter (Signed)
I called patient. The patient will come in for blood work on CellCept.

## 2017-01-28 ENCOUNTER — Other Ambulatory Visit (INDEPENDENT_AMBULATORY_CARE_PROVIDER_SITE_OTHER): Payer: Self-pay

## 2017-01-28 DIAGNOSIS — Z5181 Encounter for therapeutic drug level monitoring: Secondary | ICD-10-CM | POA: Diagnosis not present

## 2017-01-28 DIAGNOSIS — H52203 Unspecified astigmatism, bilateral: Secondary | ICD-10-CM | POA: Diagnosis not present

## 2017-01-28 DIAGNOSIS — Z0289 Encounter for other administrative examinations: Secondary | ICD-10-CM

## 2017-01-29 LAB — COMPREHENSIVE METABOLIC PANEL
ALT: 14 IU/L (ref 0–44)
AST: 24 IU/L (ref 0–40)
Albumin/Globulin Ratio: 1.7 (ref 1.2–2.2)
Albumin: 4 g/dL (ref 3.5–4.7)
Alkaline Phosphatase: 36 IU/L — ABNORMAL LOW (ref 39–117)
BUN/Creatinine Ratio: 13 (ref 10–24)
BUN: 10 mg/dL (ref 8–27)
Bilirubin Total: 1.7 mg/dL — ABNORMAL HIGH (ref 0.0–1.2)
CO2: 28 mmol/L (ref 18–29)
Calcium: 9.5 mg/dL (ref 8.6–10.2)
Chloride: 96 mmol/L (ref 96–106)
Creatinine, Ser: 0.79 mg/dL (ref 0.76–1.27)
GFR calc Af Amer: 94 mL/min/{1.73_m2} (ref >59)
GFR calc non Af Amer: 81 mL/min/{1.73_m2} (ref >59)
Globulin, Total: 2.4 g/dL (ref 1.5–4.5)
Glucose: 92 mg/dL (ref 65–99)
Potassium: 4.6 mmol/L (ref 3.5–5.2)
Sodium: 135 mmol/L (ref 134–144)
Total Protein: 6.4 g/dL (ref 6.0–8.5)

## 2017-01-29 LAB — CBC WITH DIFFERENTIAL/PLATELET
Basophils Absolute: 0 10*3/uL (ref 0.0–0.2)
Basos: 0 %
EOS (ABSOLUTE): 0.2 10*3/uL (ref 0.0–0.4)
Eos: 4 %
Hematocrit: 44.2 % (ref 37.5–51.0)
Hemoglobin: 15 g/dL (ref 13.0–17.7)
Immature Grans (Abs): 0 10*3/uL (ref 0.0–0.1)
Immature Granulocytes: 0 %
Lymphocytes Absolute: 1.4 10*3/uL (ref 0.7–3.1)
Lymphs: 27 %
MCH: 31.7 pg (ref 26.6–33.0)
MCHC: 33.9 g/dL (ref 31.5–35.7)
MCV: 93 fL (ref 79–97)
Monocytes Absolute: 0.7 10*3/uL (ref 0.1–0.9)
Monocytes: 13 %
Neutrophils Absolute: 2.9 10*3/uL (ref 1.4–7.0)
Neutrophils: 56 %
Platelets: 247 10*3/uL (ref 150–379)
RBC: 4.73 x10E6/uL (ref 4.14–5.80)
RDW: 13.3 % (ref 12.3–15.4)
WBC: 5.3 10*3/uL (ref 3.4–10.8)

## 2017-02-05 ENCOUNTER — Other Ambulatory Visit: Payer: Self-pay | Admitting: Neurology

## 2017-02-11 ENCOUNTER — Encounter (HOSPITAL_COMMUNITY): Payer: Self-pay | Admitting: *Deleted

## 2017-02-16 ENCOUNTER — Encounter (HOSPITAL_COMMUNITY): Payer: Self-pay | Admitting: Emergency Medicine

## 2017-02-16 ENCOUNTER — Encounter (HOSPITAL_COMMUNITY)
Admission: RE | Disposition: A | Discharge status: Home / Self Care | Payer: Self-pay | Source: Ambulatory Visit | Attending: Gastroenterology

## 2017-02-16 ENCOUNTER — Ambulatory Visit (HOSPITAL_COMMUNITY): Payer: Medicare Other | Admitting: Registered Nurse

## 2017-02-16 ENCOUNTER — Ambulatory Visit (HOSPITAL_COMMUNITY)
Admission: RE | Admit: 2017-02-16 | Discharge: 2017-02-16 | Disposition: A | Discharge status: Home / Self Care | Payer: Medicare Other | Source: Ambulatory Visit | Attending: Gastroenterology | Admitting: Gastroenterology

## 2017-02-16 DIAGNOSIS — K3184 Gastroparesis: Secondary | ICD-10-CM | POA: Insufficient documentation

## 2017-02-16 DIAGNOSIS — N400 Enlarged prostate without lower urinary tract symptoms: Secondary | ICD-10-CM | POA: Diagnosis not present

## 2017-02-16 DIAGNOSIS — Z87891 Personal history of nicotine dependence: Secondary | ICD-10-CM | POA: Insufficient documentation

## 2017-02-16 DIAGNOSIS — K5900 Constipation, unspecified: Secondary | ICD-10-CM | POA: Diagnosis not present

## 2017-02-16 DIAGNOSIS — G629 Polyneuropathy, unspecified: Secondary | ICD-10-CM | POA: Diagnosis not present

## 2017-02-16 DIAGNOSIS — H3530 Unspecified macular degeneration: Secondary | ICD-10-CM | POA: Insufficient documentation

## 2017-02-16 DIAGNOSIS — K449 Diaphragmatic hernia without obstruction or gangrene: Secondary | ICD-10-CM | POA: Diagnosis not present

## 2017-02-16 DIAGNOSIS — Z86010 Personal history of colonic polyps: Secondary | ICD-10-CM | POA: Diagnosis not present

## 2017-02-16 DIAGNOSIS — G709 Myoneural disorder, unspecified: Secondary | ICD-10-CM | POA: Insufficient documentation

## 2017-02-16 DIAGNOSIS — I10 Essential (primary) hypertension: Secondary | ICD-10-CM | POA: Diagnosis not present

## 2017-02-16 DIAGNOSIS — Z9049 Acquired absence of other specified parts of digestive tract: Secondary | ICD-10-CM | POA: Insufficient documentation

## 2017-02-16 DIAGNOSIS — K219 Gastro-esophageal reflux disease without esophagitis: Secondary | ICD-10-CM | POA: Insufficient documentation

## 2017-02-16 DIAGNOSIS — Z9889 Other specified postprocedural states: Secondary | ICD-10-CM | POA: Insufficient documentation

## 2017-02-16 DIAGNOSIS — Z79899 Other long term (current) drug therapy: Secondary | ICD-10-CM | POA: Diagnosis not present

## 2017-02-16 DIAGNOSIS — R195 Other fecal abnormalities: Secondary | ICD-10-CM | POA: Insufficient documentation

## 2017-02-16 DIAGNOSIS — G7000 Myasthenia gravis without (acute) exacerbation: Secondary | ICD-10-CM | POA: Diagnosis not present

## 2017-02-16 DIAGNOSIS — Z1211 Encounter for screening for malignant neoplasm of colon: Secondary | ICD-10-CM | POA: Insufficient documentation

## 2017-02-16 DIAGNOSIS — K5730 Diverticulosis of large intestine without perforation or abscess without bleeding: Secondary | ICD-10-CM | POA: Diagnosis not present

## 2017-02-16 HISTORY — PX: COLONOSCOPY WITH PROPOFOL: SHX5780

## 2017-02-16 HISTORY — DX: Personal history of other medical treatment: Z92.89

## 2017-02-16 SURGERY — COLONOSCOPY WITH PROPOFOL
Anesthesia: Monitor Anesthesia Care

## 2017-02-16 MED ORDER — LACTATED RINGERS IV SOLN
INTRAVENOUS | Status: DC
  Administered 2017-02-16: 09:00:00 via INTRAVENOUS

## 2017-02-16 MED ORDER — PROPOFOL 10 MG/ML IV BOLUS
INTRAVENOUS | Status: DC | PRN
  Administered 2017-02-16 (×11): 20 mg via INTRAVENOUS

## 2017-02-16 MED ORDER — ONDANSETRON HCL 4 MG/2ML IJ SOLN
INTRAMUSCULAR | Status: AC
  Filled 2017-02-16: qty 2

## 2017-02-16 MED ORDER — ONDANSETRON HCL 4 MG/2ML IJ SOLN
INTRAMUSCULAR | Status: DC | PRN
  Administered 2017-02-16: 4 mg via INTRAVENOUS

## 2017-02-16 MED ORDER — SODIUM CHLORIDE 0.9 % IV SOLN: INTRAVENOUS | Status: DC

## 2017-02-16 MED ORDER — LIDOCAINE 2% (20 MG/ML) 5 ML SYRINGE
INTRAMUSCULAR | Status: DC | PRN
  Administered 2017-02-16: 60 mg via INTRAVENOUS

## 2017-02-16 MED ORDER — PROPOFOL 10 MG/ML IV BOLUS
INTRAVENOUS | Status: AC
  Filled 2017-02-16: qty 60

## 2017-02-16 MED ORDER — LIDOCAINE 2% (20 MG/ML) 5 ML SYRINGE
INTRAMUSCULAR | Status: AC
  Filled 2017-02-16: qty 5

## 2017-02-16 SURGICAL SUPPLY — 21 items

## 2017-02-16 NOTE — H&P (Signed)
Problem: Positive stool hemosure  with normal hemoglobin (14.7 g). 10/14/2011 colonoscopy was performed with removal of  small adenomatous and hyperplastic colon polyps. Myasthenia gravis.  History: The patient is an 83 year old male born [birth year]. In January 2013, he underwent a colonoscopy with removal of small tubular adenomatous and hyperplastic colon polyps. Recently, he passed fresh blood with a constipated bowel movement. He submitted stool for Hemoccult testing and his stool was heme positive. His hemoglobin was normal.  He is scheduled to undergo a screening colonoscopy.  Past medical history: Myasthenia gravis. Vitamin B 12 deficiency. Macular degeneration. Peripheral neuropathy. Benign prostatic hypertrophy. Attention. Osteoporosis. Gastroparesis. Macular degeneration. Cholecystectomy. Arthroscopic knee surgery. Laparoscopic repair of paraesophageal hernia.  Exam: The patient is alert and lying comfortably on the endoscopy stretcher. Abdomen is soft and nontender to palpation. Lungs are clear to auscultation. Cardiac exam reveals a regular rhythm.  Plan: Proceed with screening colonoscopy.

## 2017-02-16 NOTE — Anesthesia Postprocedure Evaluation (Signed)
Anesthesia Post Note  Patient: Victor Sutton  Procedure(s) Performed: Procedure(s) (LRB): COLONOSCOPY WITH PROPOFOL (N/A)     Anesthesia Post Evaluation  Last Vitals:  Vitals:   02/16/17 0951 02/16/17 1030  BP: 104/61 126/63  Pulse: 65   Resp: 14   Temp: 36.4 C     Last Pain:  Vitals:   02/16/17 0951  TempSrc: Oral                 Nolon Nations

## 2017-02-16 NOTE — Op Note (Signed)
[facility] Patient Name: Victor Sutton Procedure Date: 02/16/2017 MRN: [id number] Attending MD: Garlan Fair , MD Date of Birth: Jan 30, 1928 CSN: [id number] Age: 83 Admit Type: Outpatient Procedure:                Colonoscopy Indications:              High risk colon cancer surveillance: Heme positive                            stool with normal hemoglobin. 10/14/2011 colonoscopy                            was performed with removal of small adenomatous and                            hyperplastic colon polyps. Providers:                Garlan Fair, MD, Laverta Baltimore RN, RN,                            Marcene Duos, Technician Referring MD:              Medicines:                Propofol per Anesthesia Complications:            No immediate complications. Estimated Blood Loss:     Estimated blood loss: none. Procedure:                Pre-Anesthesia Assessment:                           - Prior to the procedure, a History and Physical                            was performed, and patient medications and                            allergies were reviewed. The patient's tolerance of                            previous anesthesia was also reviewed. The risks                            and benefits of the procedure and the sedation                            options and risks were discussed with the patient.                            All questions were answered, and informed consent                            was obtained. Prior Anticoagulants: The patient has  taken no previous anticoagulant or antiplatelet                            agents. ASA Grade Assessment: III - A patient with                            severe systemic disease. After reviewing the risks                            and benefits, the patient was deemed in                            satisfactory condition to undergo the procedure.                           After  obtaining informed consent, the colonoscope                            was passed under direct vision. Throughout the                            procedure, the patient's blood pressure, pulse, and                            oxygen saturations were monitored continuously. The                            EC-[id number] ([id number]) scope was introduced through                            the anus and advanced to the the cecum, identified                            by appendiceal orifice and ileocecal valve. The                            colonoscopy was somewhat difficult due to                            significant looping. The patient tolerated the                            procedure well. The quality of the bowel                            preparation was good. The ileocecal valve, the                            appendiceal orifice and the rectum were                            photographed. Findings:      The perianal and digital rectal examinations were normal.      The entire examined colon appeared normal.  Left colonic diverticulosis       was present. Impression:               - The entire examined colon is normal.                           - No specimens collected. Moderate Sedation:      N/A- Per Anesthesia Care Recommendation:           - Patient has a contact number available for                            emergencies. The signs and symptoms of potential                            delayed complications were discussed with the                            patient. Return to normal activities tomorrow.                            Written discharge instructions were provided to the                            patient.                           - Repeat colonoscopy is not recommended for                            surveillance.                           - Resume previous diet.                           - Continue present medications. Procedure Code(s):        --- Professional ---                            J5009, Colorectal cancer screening; colonoscopy on                            individual at high risk Diagnosis Code(s):        --- Professional ---                           Z86.010, Personal history of colonic polyps CPT copyright 2016 American Medical Association. All rights reserved. The codes documented in this report are preliminary and upon coder review may  be revised to meet current compliance requirements. Earle Gell, MD Garlan Fair, MD 02/16/2017 9:53:16 AM This report has been signed electronically. Number of Addenda: 0

## 2017-02-16 NOTE — Transfer of Care (Signed)
Immediate Anesthesia Transfer of Care Note  Patient: Victor Sutton  Procedure(s) Performed: Procedure(s): COLONOSCOPY WITH PROPOFOL (N/A)  Patient Location: PACU  Anesthesia Type:MAC  Level of Consciousness:  sedated, patient cooperative and responds to stimulation  Airway & Oxygen Therapy:Patient Spontanous Breathing and Patient connected to face mask oxgen  Post-op Assessment:  Report given to PACU RN and Post -op Vital signs reviewed and stable  Post vital signs:  Reviewed and stable  Last Vitals:  Vitals:   02/16/17 0853  BP: (!) 148/84  Pulse: 71  Resp: 20  Temp: 70.4 C    Complications: No apparent anesthesia complications

## 2017-02-16 NOTE — Discharge Instructions (Signed)
YOU HAD AN ENDOSCOPIC PROCEDURE TODAY: Refer to the procedure report and other information in the discharge instructions given to you for any specific questions about what was found during the examination. If this information does not answer your questions, please call Dr. Boileau office at [phone] to clarify.  ° °YOU SHOULD EXPECT: Some feelings of bloating in the abdomen. Passage of more gas than usual. Walking can help get rid of the air that was put into your GI tract during the procedure and reduce the bloating. If you had a lower endoscopy (such as a colonoscopy or flexible sigmoidoscopy) you may notice spotting of blood in your stool or on the toilet paper. Some abdominal soreness may be present for a day or two, also. ° °DIET: Your first meal following the procedure should be a light meal and then it is ok to progress to your normal diet. A half-sandwich or bowl of soup is an example of a good first meal. Heavy or fried foods are harder to digest and may make you feel nauseous or bloated. Drink plenty of fluids but you should avoid alcoholic beverages for 24 hours. If you had a esophageal dilation, please see attached instructions for diet.  ° °ACTIVITY: Your care partner should take you home directly after the procedure. You should plan to take it easy, moving slowly for the rest of the day. You can resume normal activity the day after the procedure however YOU SHOULD NOT DRIVE, use power tools, machinery or perform tasks that involve climbing or major physical exertion for 24 hours (because of the sedation medicines used during the test).  ° °SYMPTOMS TO REPORT IMMEDIATELY: °A gastroenterologist can be reached at any hour. Please call [phone] for any of the following symptoms:  °Following lower endoscopy (colonoscopy, flexible sigmoidoscopy) °Excessive amounts of blood in the stool  °Significant tenderness, worsening of abdominal pains  °Swelling of the abdomen that is new, acute  °Fever of 100°  or higher  °Following upper endoscopy (EGD, EUS, ERCP, esophageal dilation) °Vomiting of blood or coffee ground material  °New, significant abdominal pain  °New, significant chest pain or pain under the shoulder blades  °Painful or persistently difficult swallowing  °New shortness of breath  °Black, tarry-looking or red, bloody stools ° °FOLLOW UP:  °If any biopsies were taken you will be contacted by phone or by letter within the next 1-3 weeks. Call [phone]  if you have not heard about the biopsies in 3 weeks.  °Please also call with any specific questions about appointments or follow up tests. ° ° °

## 2017-02-16 NOTE — Anesthesia Preprocedure Evaluation (Addendum)
Anesthesia Evaluation  Patient identified by MRN, date of birth, ID band Patient awake    Reviewed: Allergy & Precautions, H&P , NPO status , Patient's Chart, lab work & pertinent test results  Airway Mallampati: II  TM Distance: >3 FB Neck ROM: Full    Dental no notable dental hx.    Pulmonary neg pulmonary ROS, former smoker,    Pulmonary exam normal breath sounds clear to auscultation       Cardiovascular hypertension, Pt. on medications Normal cardiovascular exam Rhythm:Regular Rate:Normal     Neuro/Psych  Neuromuscular disease negative neurological ROS  negative psych ROS   GI/Hepatic Neg liver ROS, hiatal hernia, PUD, GERD  Medicated,  Endo/Other  negative endocrine ROS  Renal/GU negative Renal ROS     Musculoskeletal negative musculoskeletal ROS (+)   Abdominal   Peds  Hematology  (+) anemia ,   Anesthesia Other Findings   Reproductive/Obstetrics negative OB ROS                             Anesthesia Physical  Anesthesia Plan  ASA: III  Anesthesia Plan: MAC   Post-op Pain Management:    Induction:   Airway Management Planned:   Additional Equipment:   Intra-op Plan:   Post-operative Plan:   Informed Consent: I have reviewed the patients History and Physical, chart, labs and discussed the procedure including the risks, benefits and alternatives for the proposed anesthesia with the patient or authorized representative who has indicated his/her understanding and acceptance.   Dental advisory given  Plan Discussed with: CRNA  Anesthesia Plan Comments:         Anesthesia Quick Evaluation

## 2017-02-18 ENCOUNTER — Encounter (HOSPITAL_COMMUNITY): Payer: Self-pay | Admitting: Gastroenterology

## 2017-02-26 ENCOUNTER — Other Ambulatory Visit: Payer: Self-pay | Admitting: Internal Medicine

## 2017-02-26 DIAGNOSIS — K862 Cyst of pancreas: Secondary | ICD-10-CM

## 2017-03-10 ENCOUNTER — Ambulatory Visit
Admission: RE | Admit: 2017-03-10 | Discharge: 2017-03-10 | Disposition: A | Discharge status: Home / Self Care | Payer: Medicare Other | Source: Ambulatory Visit | Attending: Internal Medicine | Admitting: Internal Medicine

## 2017-03-10 DIAGNOSIS — K862 Cyst of pancreas: Secondary | ICD-10-CM | POA: Diagnosis not present

## 2017-03-10 MED ORDER — GADOBENATE DIMEGLUMINE 529 MG/ML IV SOLN
15.0000 mL | Freq: Once | INTRAVENOUS | Status: AC | PRN
  Administered 2017-03-10: 15 mL via INTRAVENOUS

## 2017-03-17 ENCOUNTER — Other Ambulatory Visit: Payer: Self-pay | Admitting: Neurology

## 2017-03-25 DIAGNOSIS — Z6824 Body mass index (BMI) 24.0-24.9, adult: Secondary | ICD-10-CM | POA: Diagnosis not present

## 2017-03-25 DIAGNOSIS — Z79899 Other long term (current) drug therapy: Secondary | ICD-10-CM | POA: Diagnosis not present

## 2017-03-25 DIAGNOSIS — M810 Age-related osteoporosis without current pathological fracture: Secondary | ICD-10-CM | POA: Diagnosis not present

## 2017-04-06 ENCOUNTER — Ambulatory Visit (HOSPITAL_COMMUNITY)
Admission: RE | Admit: 2017-04-06 | Discharge: 2017-04-06 | Disposition: A | Discharge status: Home / Self Care | Payer: Medicare Other | Source: Ambulatory Visit | Attending: Internal Medicine | Admitting: Internal Medicine

## 2017-04-06 ENCOUNTER — Encounter (HOSPITAL_COMMUNITY): Payer: Self-pay

## 2017-04-06 DIAGNOSIS — M810 Age-related osteoporosis without current pathological fracture: Secondary | ICD-10-CM | POA: Insufficient documentation

## 2017-04-06 MED ORDER — ZOLEDRONIC ACID 5 MG/100ML IV SOLN
5.0000 mg | Freq: Once | INTRAVENOUS | Status: AC
  Administered 2017-04-06: 5 mg via INTRAVENOUS
  Filled 2017-04-06: qty 100

## 2017-04-06 MED ORDER — SODIUM CHLORIDE 0.9 % IV SOLN
Freq: Once | INTRAVENOUS | Status: AC
  Administered 2017-04-06: 11:00:00 via INTRAVENOUS

## 2017-04-06 NOTE — Progress Notes (Signed)
Reclast given today, pt has had this med about 3 other occasions.  Pt has done well with Reclast.  D/c instructions on Reclast given to pt.  Pt does not take calcium, his calcium level is normal at 9.4.  Pt takes Vitamin D once a week.  Pt tolerated infusion well.  Pt d/c ambulatory with family member to lobby.

## 2017-04-06 NOTE — Discharge Instructions (Signed)

## 2017-04-30 ENCOUNTER — Ambulatory Visit (INDEPENDENT_AMBULATORY_CARE_PROVIDER_SITE_OTHER): Payer: Medicare Other | Admitting: Neurology

## 2017-04-30 ENCOUNTER — Encounter: Payer: Self-pay | Admitting: Neurology

## 2017-04-30 VITALS — BP 123/73 | HR 59 | Ht 70.0 in | Wt 169.0 lb

## 2017-04-30 DIAGNOSIS — G7000 Myasthenia gravis without (acute) exacerbation: Secondary | ICD-10-CM | POA: Diagnosis not present

## 2017-04-30 DIAGNOSIS — Z5181 Encounter for therapeutic drug level monitoring: Secondary | ICD-10-CM | POA: Diagnosis not present

## 2017-04-30 NOTE — Progress Notes (Signed)
Reason for visit: Myasthenia gravis  Victor Sutton is an 83 y.o. 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 The patient has done quite well on low-dose CellCept taking 250 mg twice daily, he is off prednisone. He has symptoms of gastroparesis and constipation, this has been improved with use of low-dose Mestinon taking 30 mg with meals. The patient also is now eating prunes which has helped. The patient has macular degeneration, he is slowly losing his vision, but he denies any double vision. He is still able to operate a motor vehicle. The patient denies any weakness of extremities, he denies droopiness of the eyelids or double vision. He returns to this office for an evaluation.  Past Medical History:  Diagnosis Date  . Adenomatous colon polyp   . B12 deficiency    HIstory  . BPH (benign prostatic hyperplasia)   . Cameron ulcer   . Cancer ([place])    precancerous skin lesions on leg   . Cataract 2009   bilateral cataract surgery  . Delayed gastric emptying   . Disturbance of salivary secretion 06/20/2013  . Dysphagia, pharyngoesophageal phase 03/16/2013  . Dysphonia 03/16/2013  . GERD (gastroesophageal reflux disease)   . Hiatal hernia    hiatial hernia repair  02/08/13  . History of blood transfusion   . Hyperlipidemia    not on medication  . Hypertension   . Iron deficiency anemia   . Leukoplakia 2001   Treated for  . Macular degeneration    (L) wet, (R) dry  . Myasthenia gravis ([place]) 03/21/2013  . Peripheral neuropathy    Mild History of  . Polyneuritis 1965   Resolved  . Ulcer   . Vitamin D deficiency     Past Surgical History:  Procedure Laterality Date  . Arthroscopic knee surgery     left  . cateract surgeries      bilateral  . CHOLECYSTECTOMY    . COLONOSCOPY WITH PROPOFOL N/A 02/16/2017   Procedure: COLONOSCOPY WITH PROPOFOL;   Surgeon: Garlan Fair, MD;  [facility];  Service: Endoscopy;  Laterality: N/A;  . GASTROSTOMY W/ FEEDING TUBE  [id number]  . HERNIA REPAIR     Laparoscopic ventral  . HIATAL HERNIA REPAIR  2004  . HIATAL HERNIA REPAIR N/A 02/08/2013   Procedure:  REPAIR OFCURRENT HIATAL HERNIA, ;  Surgeon: Odis Hollingshead, MD;  [facility];  Service: General;  Laterality: N/A;  . LAPAROSCOPIC LYSIS OF ADHESIONS N/A 02/08/2013   Procedure: LAPAROSCOPIC LYSIS OF ADHESIONS;  Surgeon: Odis Hollingshead, MD;  [facility];  Service: General;  Laterality: N/A;  . LAPAROSCOPIC NISSEN FUNDOPLICATION  05/13/9370   Procedure: LAPAROSCOPIC NISSEN FUNDOPLICATION;  Surgeon: Odis Hollingshead, MD;  [facility];  Service: General;;  . Multiple oral surgeries  2002  . SKIN CANCER EXCISION  10/2012   right leg-shin area  . STOMACH SURGERY  2005   states his stomach had moved up toward his chest , some type of surgery performed  . TONSILLECTOMY    . UPPER GI ENDOSCOPY N/A 02/08/2013   Procedure: UPPER GI ENDOSCOPY;  Surgeon: Odis Hollingshead, MD;  [facility];  Service: General;  Laterality: N/A;    Family History  Problem Relation Age of Onset  . Breast cancer Sister   . CAD Sister   . COPD Sister   .  Diabetes Father   . Heart attack Father   . Colon cancer Son 22  . Prostate cancer Paternal Grandfather   . Colon cancer Son 67    Social history:  reports that he quit smoking about 40 years ago. He has never used smokeless tobacco. He reports that he does not drink alcohol or use drugs.    Allergies  Allergen Reactions  . Ciprofloxacin Anaphylaxis    Avoid due to myasthenia gravis  . Quinolones Anaphylaxis    Avoid due to myasthenia gravis    Medications:  Prior to Admission medications   Medication Sig Start Date End Date Taking? Authorizing Provider  acetaminophen (TYLENOL) 500 MG tablet Take 1,000 mg by mouth daily as needed for moderate pain or headache.   Yes  [provider]  hydrochlorothiazide (HYDRODIURIL) 25 MG tablet Take 1 tablet (25 mg total) by mouth daily. 04/07/16  Yes Kathrynn Ducking, MD  Multiple Vitamins-Minerals (CENTRUM SILVER PO) Take 1 tablet by mouth daily.   Yes [provider]  Multiple Vitamins-Minerals (ICAPS) CAPS Take 1 capsule by mouth 2 (two) times daily.   Yes [provider]  mycophenolate (CELLCEPT) 250 MG capsule Take 1 capsule (250 mg total) by mouth 2 (two) times daily. 10/30/16  Yes Kathrynn Ducking, MD  Omega-3 Fatty Acids (FISH OIL ULTRA) 1400 MG CAPS Take 1,400 mg by mouth 2 (two) times daily.   Yes [provider]  pantoprazole (PROTONIX) 40 MG tablet Take 40 mg by mouth daily. May take an additional 40mg s as needed for heartburn 09/27/13  Yes [provider]  Polyethyl Glycol-Propyl Glycol (SYSTANE OP) Apply 1 drop to eye as needed (dry eyes).   Yes [provider]  potassium chloride (K-DUR) 10 MEQ tablet TAKE 1 TABLET BY MOUTH 2 TIMES DAILY. Patient taking differently: Take 10 meq by mouth once daily 02/05/17  Yes Kathrynn Ducking, MD  pyridostigmine (MESTINON) 60 MG tablet TAKE 1/2 TABLET BY MOUTH 3 TIMES A DAY. 03/17/17  Yes Sater, Nanine Means, MD  UNABLE TO FIND Take 1 tablet by mouth daily. Med Name:EyePromise - zeaxanthin 10mg  and lutein 10mg    Yes [provider]  Vitamin D, Ergocalciferol, (DRISDOL) 50000 UNITS CAPS capsule Take 50,000 Units by mouth every 7 (seven) days. ON FRIDAYS   Yes [provider]  zoledronic acid (RECLAST) 5 MG/100ML SOLN injection Inject 5 mg into the vein once. YEARLY   Yes [provider]    ROS:  Out of a complete 14 system review of symptoms, the patient complains only of the following symptoms, and all other reviewed systems are negative.  Cold intolerance Leg swelling Bruising easily Joint pain Snoring  Blood pressure 123/73, pulse (!) 59, height 5\' 10"  (1.778 m), weight 169 lb (76.7  kg).  Physical Exam  General: The patient is alert and cooperative at the time of the examination.  Skin: 2+ edema at the ankles is noted bilaterally.   Neurologic Exam  Mental status: The patient is alert and oriented x 3 at the time of the examination. The patient has apparent normal recent and remote memory, with an apparently normal attention span and concentration ability.   Cranial nerves: Facial symmetry is present. Speech is normal, no aphasia or dysarthria is noted. Extraocular movements are full. Visual fields are full. Patient has good strength the patient muscles and the muscles with head turning and shoulder shrug bilaterally. He has good strength of jaw opening and closure. With superior deviation of the  eyes for 1 minute, no ptosis or divergence of gaze or subjective double vision was noted.  Motor: The patient has good strength in all 4 extremities. With arms outstretched for 1 minute, no fatigable weakness of the deltoid muscles were noted.  Sensory examination: Soft touch sensation is symmetric on the face, arms, and legs.  Coordination: The patient has good finger-nose-finger and heel-to-shin bilaterally.  Gait and station: The patient has a normal gait. Tandem gait is unsteady. Romberg is negative. No drift is seen.  Reflexes: Deep tendon reflexes are symmetric.   Assessment/Plan:  1. Myasthenia gravis  2. Gastroparesis, constipation  The patient is doing well on Mestinon and CellCept. Blood work will be done today, he will follow-up in 6 months, sooner if needed.  Jill Alexanders MD 04/30/2017 9:02 AM  [facility] [address] [place], [place] [address]  Phone [phone] Fax [phone]

## 2017-05-01 LAB — CBC WITH DIFFERENTIAL/PLATELET
Basophils Absolute: 0 10*3/uL (ref 0.0–0.2)
Basos: 1 %
EOS (ABSOLUTE): 0.2 10*3/uL (ref 0.0–0.4)
Eos: 4 %
Hematocrit: 43.9 % (ref 37.5–51.0)
Hemoglobin: 14.7 g/dL (ref 13.0–17.7)
Immature Grans (Abs): 0 10*3/uL (ref 0.0–0.1)
Immature Granulocytes: 0 %
Lymphocytes Absolute: 1.4 10*3/uL (ref 0.7–3.1)
Lymphs: 32 %
MCH: 31.3 pg (ref 26.6–33.0)
MCHC: 33.5 g/dL (ref 31.5–35.7)
MCV: 94 fL (ref 79–97)
Monocytes Absolute: 0.3 10*3/uL (ref 0.1–0.9)
Monocytes: 8 %
Neutrophils Absolute: 2.4 10*3/uL (ref 1.4–7.0)
Neutrophils: 55 %
Platelets: 253 10*3/uL (ref 150–379)
RBC: 4.69 x10E6/uL (ref 4.14–5.80)
RDW: 13.3 % (ref 12.3–15.4)
WBC: 4.3 10*3/uL (ref 3.4–10.8)

## 2017-05-01 LAB — COMPREHENSIVE METABOLIC PANEL
ALT: 17 IU/L (ref 0–44)
AST: 33 IU/L (ref 0–40)
Albumin/Globulin Ratio: 1.5 (ref 1.2–2.2)
Albumin: 3.9 g/dL (ref 3.5–4.7)
Alkaline Phosphatase: 43 IU/L (ref 39–117)
BUN/Creatinine Ratio: 10 (ref 10–24)
BUN: 8 mg/dL (ref 8–27)
Bilirubin Total: 1.3 mg/dL — ABNORMAL HIGH (ref 0.0–1.2)
CO2: 25 mmol/L (ref 20–29)
Calcium: 9.2 mg/dL (ref 8.6–10.2)
Chloride: 96 mmol/L (ref 96–106)
Creatinine, Ser: 0.8 mg/dL (ref 0.76–1.27)
GFR calc Af Amer: 94 mL/min/{1.73_m2} (ref >59)
GFR calc non Af Amer: 81 mL/min/{1.73_m2} (ref >59)
Globulin, Total: 2.6 g/dL (ref 1.5–4.5)
Glucose: 100 mg/dL — ABNORMAL HIGH (ref 65–99)
Potassium: 3.9 mmol/L (ref 3.5–5.2)
Sodium: 136 mmol/L (ref 134–144)
Total Protein: 6.5 g/dL (ref 6.0–8.5)

## 2017-06-27 DIAGNOSIS — Z23 Encounter for immunization: Secondary | ICD-10-CM | POA: Diagnosis not present

## 2017-07-03 DIAGNOSIS — N390 Urinary tract infection, site not specified: Secondary | ICD-10-CM | POA: Diagnosis not present

## 2017-07-03 DIAGNOSIS — M5416 Radiculopathy, lumbar region: Secondary | ICD-10-CM | POA: Diagnosis not present

## 2017-07-24 DIAGNOSIS — R310 Gross hematuria: Secondary | ICD-10-CM | POA: Diagnosis not present

## 2017-07-24 DIAGNOSIS — N3000 Acute cystitis without hematuria: Secondary | ICD-10-CM | POA: Diagnosis not present

## 2017-07-31 ENCOUNTER — Telehealth: Payer: Self-pay | Admitting: Neurology

## 2017-07-31 DIAGNOSIS — Z5181 Encounter for therapeutic drug level monitoring: Secondary | ICD-10-CM

## 2017-07-31 NOTE — Telephone Encounter (Signed)
I called the patient, talked with the caretaker, the patient is to come in for blood work to include a CBC and a comprehensive metabolic profile.  The patient is on CellCept.

## 2017-08-03 ENCOUNTER — Other Ambulatory Visit (INDEPENDENT_AMBULATORY_CARE_PROVIDER_SITE_OTHER): Payer: Self-pay

## 2017-08-03 DIAGNOSIS — Z5181 Encounter for therapeutic drug level monitoring: Secondary | ICD-10-CM | POA: Diagnosis not present

## 2017-08-03 DIAGNOSIS — Z0289 Encounter for other administrative examinations: Secondary | ICD-10-CM

## 2017-08-04 ENCOUNTER — Other Ambulatory Visit: Payer: Self-pay | Admitting: Neurology

## 2017-08-04 LAB — CBC WITH DIFFERENTIAL/PLATELET
Basophils Absolute: 0 10*3/uL (ref 0.0–0.2)
Basos: 0 %
EOS (ABSOLUTE): 0.1 10*3/uL (ref 0.0–0.4)
Eos: 1 %
Hematocrit: 44.4 % (ref 37.5–51.0)
Hemoglobin: 14.6 g/dL (ref 13.0–17.7)
Immature Grans (Abs): 0 10*3/uL (ref 0.0–0.1)
Immature Granulocytes: 0 %
Lymphocytes Absolute: 0.8 10*3/uL (ref 0.7–3.1)
Lymphs: 18 %
MCH: 31.1 pg (ref 26.6–33.0)
MCHC: 32.9 g/dL (ref 31.5–35.7)
MCV: 95 fL (ref 79–97)
Monocytes Absolute: 0.4 10*3/uL (ref 0.1–0.9)
Monocytes: 9 %
Neutrophils Absolute: 3.1 10*3/uL (ref 1.4–7.0)
Neutrophils: 72 %
Platelets: 271 10*3/uL (ref 150–379)
RBC: 4.69 x10E6/uL (ref 4.14–5.80)
RDW: 13.2 % (ref 12.3–15.4)
WBC: 4.5 10*3/uL (ref 3.4–10.8)

## 2017-08-04 LAB — COMPREHENSIVE METABOLIC PANEL
ALT: 17 IU/L (ref 0–44)
AST: 27 IU/L (ref 0–40)
Albumin/Globulin Ratio: 1.7 (ref 1.2–2.2)
Albumin: 4.3 g/dL (ref 3.5–4.7)
Alkaline Phosphatase: 43 IU/L (ref 39–117)
BUN/Creatinine Ratio: 14 (ref 10–24)
BUN: 13 mg/dL (ref 8–27)
Bilirubin Total: 1.1 mg/dL (ref 0.0–1.2)
CO2: 25 mmol/L (ref 20–29)
Calcium: 9.2 mg/dL (ref 8.6–10.2)
Chloride: 96 mmol/L (ref 96–106)
Creatinine, Ser: 0.95 mg/dL (ref 0.76–1.27)
GFR calc Af Amer: 83 mL/min/{1.73_m2} (ref >59)
GFR calc non Af Amer: 72 mL/min/{1.73_m2} (ref >59)
Globulin, Total: 2.5 g/dL (ref 1.5–4.5)
Glucose: 103 mg/dL — ABNORMAL HIGH (ref 65–99)
Potassium: 3.5 mmol/L (ref 3.5–5.2)
Sodium: 136 mmol/L (ref 134–144)
Total Protein: 6.8 g/dL (ref 6.0–8.5)

## 2017-08-13 DIAGNOSIS — N3000 Acute cystitis without hematuria: Secondary | ICD-10-CM | POA: Diagnosis not present

## 2017-10-09 ENCOUNTER — Encounter: Payer: Self-pay | Admitting: Neurology

## 2017-10-09 DIAGNOSIS — E539 Vitamin B deficiency, unspecified: Secondary | ICD-10-CM | POA: Diagnosis not present

## 2017-10-09 DIAGNOSIS — I10 Essential (primary) hypertension: Secondary | ICD-10-CM | POA: Diagnosis not present

## 2017-10-09 DIAGNOSIS — M810 Age-related osteoporosis without current pathological fracture: Secondary | ICD-10-CM | POA: Diagnosis not present

## 2017-10-09 DIAGNOSIS — Z125 Encounter for screening for malignant neoplasm of prostate: Secondary | ICD-10-CM | POA: Diagnosis not present

## 2017-10-09 DIAGNOSIS — R82998 Other abnormal findings in urine: Secondary | ICD-10-CM | POA: Diagnosis not present

## 2017-10-16 DIAGNOSIS — M810 Age-related osteoporosis without current pathological fracture: Secondary | ICD-10-CM | POA: Diagnosis not present

## 2017-10-16 DIAGNOSIS — Z0000 Encounter for general adult medical examination without abnormal findings: Secondary | ICD-10-CM | POA: Diagnosis not present

## 2017-10-16 DIAGNOSIS — I2510 Atherosclerotic heart disease of native coronary artery without angina pectoris: Secondary | ICD-10-CM | POA: Diagnosis not present

## 2017-10-16 DIAGNOSIS — I10 Essential (primary) hypertension: Secondary | ICD-10-CM | POA: Diagnosis not present

## 2017-10-19 ENCOUNTER — Encounter: Payer: Self-pay | Admitting: Neurology

## 2017-10-19 ENCOUNTER — Telehealth: Payer: Self-pay | Admitting: Neurology

## 2017-10-19 DIAGNOSIS — Z1212 Encounter for screening for malignant neoplasm of rectum: Secondary | ICD-10-CM | POA: Diagnosis not present

## 2017-10-19 LAB — IFOBT (OCCULT BLOOD): IFOBT: NEGATIVE

## 2017-10-19 NOTE — Telephone Encounter (Signed)
The primary care physician has sent me blood work results that were done on 09 October 2017.  The BUN was 13, creatinine 0.8, sodium 139, potassium 3.8, chloride 103, CO2 28, calcium 9.1, total protein 6.5, albumin 3.6, liver profile was unremarkable with exception of the alkaline phosphatase was slightly low at 30.  Total bilirubin was slightly high at 2.2.  White blood count was 4.25, hemoglobin was 15.1, hematocrit 45.8, platelets of 208, MCV was 98.2.  TSH of 1.44, PSA of 0.432, vitamin D level 32.1.

## 2017-10-21 DIAGNOSIS — M810 Age-related osteoporosis without current pathological fracture: Secondary | ICD-10-CM | POA: Diagnosis not present

## 2017-10-27 DIAGNOSIS — H353131 Nonexudative age-related macular degeneration, bilateral, early dry stage: Secondary | ICD-10-CM | POA: Diagnosis not present

## 2017-10-27 DIAGNOSIS — H353130 Nonexudative age-related macular degeneration, bilateral, stage unspecified: Secondary | ICD-10-CM | POA: Diagnosis not present

## 2017-10-27 DIAGNOSIS — H35341 Macular cyst, hole, or pseudohole, right eye: Secondary | ICD-10-CM | POA: Diagnosis not present

## 2017-10-27 DIAGNOSIS — Z961 Presence of intraocular lens: Secondary | ICD-10-CM | POA: Diagnosis not present

## 2017-11-05 ENCOUNTER — Ambulatory Visit: Payer: Medicare Other | Admitting: Neurology

## 2017-11-05 ENCOUNTER — Encounter: Payer: Self-pay | Admitting: Neurology

## 2017-11-05 VITALS — BP 109/71 | HR 72 | Wt 171.0 lb

## 2017-11-05 DIAGNOSIS — G7000 Myasthenia gravis without (acute) exacerbation: Secondary | ICD-10-CM

## 2017-11-05 MED ORDER — MYCOPHENOLATE MOFETIL 500 MG PO TABS: 500.0000 mg | ORAL_TABLET | Freq: Every day | ORAL | 1 refills | Status: DC

## 2017-11-05 MED ORDER — PYRIDOSTIGMINE BROMIDE 60 MG PO TABS: ORAL_TABLET | ORAL | 3 refills | Status: DC

## 2017-11-05 NOTE — Progress Notes (Signed)
Reason for visit: Myasthenia gravis  Victor Sutton is an 84 y.o. male  History of present illness:  Victor Sutton is an 84 year old right-handed white male with a history of myasthenia gravis with pharyngeal features.  The patient has done well on low-dose CellCept and Mestinon.  The patient believes that the Mestinon has helped his gastroparesis symptoms, he still has some episodes of significant abdominal pain and cramping.  He may have diarrhea on occasion.  The patient does have lactose intolerance.  The patient denies any problems with chewing or swallowing.  He has good strength of the muscles of the arms and legs, he does report some generalized fatigue.  He denies that there has been a significant change since last seen.  He returns for an evaluation.  He has had recent blood work done through his primary care physician.  Past Medical History:  Diagnosis Date  . Adenomatous colon polyp   . B12 deficiency    HIstory  . BPH (benign prostatic hyperplasia)   . Cameron ulcer   . Cancer ([place])    precancerous skin lesions on leg   . Cataract 2009   bilateral cataract surgery  . Delayed gastric emptying   . Disturbance of salivary secretion 06/20/2013  . Dysphagia, pharyngoesophageal phase 03/16/2013  . Dysphonia 03/16/2013  . GERD (gastroesophageal reflux disease)   . Hiatal hernia    hiatial hernia repair  02/08/13  . History of blood transfusion   . Hyperlipidemia    not on medication  . Hypertension   . Iron deficiency anemia   . Leukoplakia 2001   Treated for  . Macular degeneration    (L) wet, (R) dry  . Myasthenia gravis ([place]) 03/21/2013  . Peripheral neuropathy    Mild History of  . Polyneuritis 1965   Resolved  . Ulcer   . Vitamin D deficiency     Past Surgical History:  Procedure Laterality Date  . Arthroscopic knee surgery     left  . cateract surgeries      bilateral  . CHOLECYSTECTOMY    . COLONOSCOPY WITH PROPOFOL N/A 02/16/2017   Procedure: COLONOSCOPY  WITH PROPOFOL;  Surgeon: Garlan Fair, MD;  [facility];  Service: Endoscopy;  Laterality: N/A;  . GASTROSTOMY W/ FEEDING TUBE  [id number]  . HERNIA REPAIR     Laparoscopic ventral  . HIATAL HERNIA REPAIR  2004  . HIATAL HERNIA REPAIR N/A 02/08/2013   Procedure:  REPAIR OFCURRENT HIATAL HERNIA, ;  Surgeon: Odis Hollingshead, MD;  [facility];  Service: General;  Laterality: N/A;  . LAPAROSCOPIC LYSIS OF ADHESIONS N/A 02/08/2013   Procedure: LAPAROSCOPIC LYSIS OF ADHESIONS;  Surgeon: Odis Hollingshead, MD;  [facility];  Service: General;  Laterality: N/A;  . LAPAROSCOPIC NISSEN FUNDOPLICATION  05/13/9370   Procedure: LAPAROSCOPIC NISSEN FUNDOPLICATION;  Surgeon: Odis Hollingshead, MD;  [facility];  Service: General;;  . Multiple oral surgeries  2002  . SKIN CANCER EXCISION  10/2012   right leg-shin area  . STOMACH SURGERY  2005   states his stomach had moved up toward his chest , some type of surgery performed  . TONSILLECTOMY    . UPPER GI ENDOSCOPY N/A 02/08/2013   Procedure: UPPER GI ENDOSCOPY;  Surgeon: Odis Hollingshead, MD;  [facility];  Service: General;  Laterality: N/A;    Family History  Problem Relation Age of Onset  . Breast cancer Sister   . CAD Sister   .  COPD Sister   . Diabetes Father   . Heart attack Father   . Colon cancer Son 78  . Prostate cancer Paternal Grandfather   . Colon cancer Son 39    Social history:  reports that he quit smoking about 41 years ago. he has never used smokeless tobacco. He reports that he does not drink alcohol or use drugs.    Allergies  Allergen Reactions  . Ciprofloxacin Anaphylaxis    Avoid due to myasthenia gravis  . Quinolones Anaphylaxis    Avoid due to myasthenia gravis    Medications:  Prior to Admission medications   Medication Sig Start Date End Date Taking? Authorizing Provider  acetaminophen (TYLENOL) 500 MG tablet Take 1,000 mg by mouth daily as needed for moderate pain or  headache.   Yes [provider]  BIOTIN PO Take by mouth.   Yes [provider]  CALCIUM PO Take by mouth.   Yes [provider]  hydrochlorothiazide (HYDRODIURIL) 25 MG tablet Take 1 tablet (25 mg total) by mouth daily. 04/07/16  Yes Kathrynn Ducking, MD  Multiple Vitamins-Minerals (CENTRUM SILVER PO) Take 1 tablet by mouth daily.   Yes [provider]  Multiple Vitamins-Minerals (ICAPS) CAPS Take 1 capsule by mouth 2 (two) times daily.   Yes [provider]  Omega-3 Fatty Acids (FISH OIL ULTRA) 1400 MG CAPS Take 1,400 mg by mouth 2 (two) times daily.   Yes [provider]  pantoprazole (PROTONIX) 40 MG tablet Take 40 mg by mouth daily. May take an additional 40mg s as needed for heartburn 09/27/13  Yes [provider]  Polyethyl Glycol-Propyl Glycol (SYSTANE OP) Apply 1 drop to eye as needed (dry eyes).   Yes [provider]  potassium chloride (K-DUR) 10 MEQ tablet TAKE 1 TABLET BY MOUTH 2 TIMES DAILY. Patient taking differently: Take 10 meq by mouth once daily 02/05/17  Yes Kathrynn Ducking, MD  pyridostigmine (MESTINON) 60 MG tablet TAKE 1/2 TABLET BY MOUTH 3 TIMES A DAY. 11/05/17  Yes Kathrynn Ducking, MD  tamsulosin [facility]) 0.4 MG CAPS capsule  10/24/17  Yes [provider]  UNABLE TO FIND Take 1 tablet by mouth daily. Med Name:EyePromise - zeaxanthin 10mg  and lutein 10mg    Yes [provider]  Vitamin D, Ergocalciferol, (DRISDOL) 50000 UNITS CAPS capsule Take 50,000 Units by mouth every 7 (seven) days. ON FRIDAYS   Yes [provider]  mycophenolate (CELLCEPT) 500 MG tablet Take 1 tablet (500 mg total) by mouth daily. 11/05/17   Kathrynn Ducking, MD  zoledronic acid (RECLAST) 5 MG/100ML SOLN injection Inject 5 mg into the vein once. YEARLY    [provider]    ROS:  Out of a complete 14 system review of symptoms, the patient complains only of the following symptoms, and all other  reviewed systems are negative.  Chills, fatigue Hearing loss, runny nose Leg swelling Urinary urgency Bruising easily  Blood pressure 109/71, pulse 72, weight 171 lb (77.6 kg).  Physical Exam  General: The patient is alert and cooperative at the time of the examination.  Skin: 1-2+ edema at the ankles is seen bilaterally.   Neurologic Exam  Mental status: The patient is alert and oriented x 3 at the time of the examination. The patient has apparent normal recent and remote memory, with an apparently normal attention span and concentration ability.   Cranial nerves: Facial symmetry is present. Speech is normal, no aphasia or dysarthria is noted. Extraocular movements  are full. Visual fields are full.  The patient has good strength with the facial muscles and with jaw opening and closure, head turning and shoulder shrug bilaterally.  Motor: The patient has good strength in all 4 extremities.  Sensory examination: Soft touch sensation is symmetric on the face, arms, and legs.  Coordination: The patient has good finger-nose-finger and heel-to-shin bilaterally.  Gait and station: The patient has a normal gait. Tandem gait is unsteady. Romberg is negative. No drift is seen.  Reflexes: Deep tendon reflexes are symmetric.   Assessment/Plan:  1.  Myasthenia gravis with pharyngeal features  The patient has done quite well on low-dose CellCept and Mestinon.  He has apparently had some problems with bone density, he is now off of prednisone.  The patient will continue his current medications, he will follow-up in 6 months, he will have blood work done in 3 months.  A prescription was sent in for CellCept and for Mestinon.   Jill Alexanders MD 11/05/2017 9:25 AM  [facility] [address] [place], [place] [address]  Phone [phone] Fax [phone]

## 2017-11-10 ENCOUNTER — Telehealth: Payer: Self-pay | Admitting: Neurology

## 2017-11-10 NOTE — Telephone Encounter (Signed)
Error

## 2018-01-19 DIAGNOSIS — H52223 Regular astigmatism, bilateral: Secondary | ICD-10-CM | POA: Diagnosis not present

## 2018-01-20 ENCOUNTER — Other Ambulatory Visit: Payer: Self-pay | Admitting: Dermatology

## 2018-01-20 DIAGNOSIS — B078 Other viral warts: Secondary | ICD-10-CM | POA: Diagnosis not present

## 2018-01-20 DIAGNOSIS — D0439 Carcinoma in situ of skin of other parts of face: Secondary | ICD-10-CM | POA: Diagnosis not present

## 2018-01-20 DIAGNOSIS — D485 Neoplasm of uncertain behavior of skin: Secondary | ICD-10-CM | POA: Diagnosis not present

## 2018-02-01 ENCOUNTER — Telehealth: Payer: Self-pay | Admitting: Neurology

## 2018-02-01 DIAGNOSIS — Z5181 Encounter for therapeutic drug level monitoring: Secondary | ICD-10-CM

## 2018-02-01 NOTE — Telephone Encounter (Signed)
I called the patient.  He is to come in for blood work on CellCept.  I have placed the orders.

## 2018-02-02 ENCOUNTER — Other Ambulatory Visit (INDEPENDENT_AMBULATORY_CARE_PROVIDER_SITE_OTHER): Payer: Self-pay

## 2018-02-02 DIAGNOSIS — Z5181 Encounter for therapeutic drug level monitoring: Secondary | ICD-10-CM

## 2018-02-02 DIAGNOSIS — Z0289 Encounter for other administrative examinations: Secondary | ICD-10-CM

## 2018-02-03 LAB — COMPREHENSIVE METABOLIC PANEL
ALT: 13 IU/L (ref 0–44)
AST: 22 IU/L (ref 0–40)
Albumin/Globulin Ratio: 1.5 (ref 1.2–2.2)
Albumin: 3.8 g/dL (ref 3.5–4.7)
Alkaline Phosphatase: 35 IU/L — ABNORMAL LOW (ref 39–117)
BUN/Creatinine Ratio: 10 (ref 10–24)
BUN: 8 mg/dL (ref 8–27)
Bilirubin Total: 1.9 mg/dL — ABNORMAL HIGH (ref 0.0–1.2)
CO2: 28 mmol/L (ref 20–29)
Calcium: 9.4 mg/dL (ref 8.6–10.2)
Chloride: 96 mmol/L (ref 96–106)
Creatinine, Ser: 0.8 mg/dL (ref 0.76–1.27)
GFR calc Af Amer: 93 mL/min/{1.73_m2} (ref >59)
GFR calc non Af Amer: 80 mL/min/{1.73_m2} (ref >59)
Globulin, Total: 2.5 g/dL (ref 1.5–4.5)
Glucose: 103 mg/dL — ABNORMAL HIGH (ref 65–99)
Potassium: 4 mmol/L (ref 3.5–5.2)
Sodium: 138 mmol/L (ref 134–144)
Total Protein: 6.3 g/dL (ref 6.0–8.5)

## 2018-02-03 LAB — CBC WITH DIFFERENTIAL/PLATELET
Basophils Absolute: 0 10*3/uL (ref 0.0–0.2)
Basos: 0 %
EOS (ABSOLUTE): 0.2 10*3/uL (ref 0.0–0.4)
Eos: 6 %
Hematocrit: 44.5 % (ref 37.5–51.0)
Hemoglobin: 14.9 g/dL (ref 13.0–17.7)
Immature Grans (Abs): 0 10*3/uL (ref 0.0–0.1)
Immature Granulocytes: 0 %
Lymphocytes Absolute: 1.2 10*3/uL (ref 0.7–3.1)
Lymphs: 35 %
MCH: 32 pg (ref 26.6–33.0)
MCHC: 33.5 g/dL (ref 31.5–35.7)
MCV: 96 fL (ref 79–97)
Monocytes Absolute: 0.4 10*3/uL (ref 0.1–0.9)
Monocytes: 11 %
Neutrophils Absolute: 1.6 10*3/uL (ref 1.4–7.0)
Neutrophils: 48 %
Platelets: 214 10*3/uL (ref 150–450)
RBC: 4.65 x10E6/uL (ref 4.14–5.80)
RDW: 13.9 % (ref 12.3–15.4)
WBC: 3.4 10*3/uL (ref 3.4–10.8)

## 2018-02-24 DIAGNOSIS — G7000 Myasthenia gravis without (acute) exacerbation: Secondary | ICD-10-CM | POA: Diagnosis not present

## 2018-02-24 DIAGNOSIS — Z6823 Body mass index (BMI) 23.0-23.9, adult: Secondary | ICD-10-CM | POA: Diagnosis not present

## 2018-02-24 DIAGNOSIS — R05 Cough: Secondary | ICD-10-CM | POA: Diagnosis not present

## 2018-02-24 DIAGNOSIS — I10 Essential (primary) hypertension: Secondary | ICD-10-CM | POA: Diagnosis not present

## 2018-03-02 DIAGNOSIS — J209 Acute bronchitis, unspecified: Secondary | ICD-10-CM | POA: Diagnosis not present

## 2018-03-04 DIAGNOSIS — D0439 Carcinoma in situ of skin of other parts of face: Secondary | ICD-10-CM | POA: Diagnosis not present

## 2018-05-12 ENCOUNTER — Encounter: Payer: Self-pay | Admitting: Neurology

## 2018-05-12 ENCOUNTER — Ambulatory Visit: Payer: Medicare Other | Admitting: Neurology

## 2018-05-12 VITALS — BP 139/85 | HR 67 | Wt 164.5 lb

## 2018-05-12 DIAGNOSIS — Z5181 Encounter for therapeutic drug level monitoring: Secondary | ICD-10-CM | POA: Diagnosis not present

## 2018-05-12 MED ORDER — MYCOPHENOLATE MOFETIL 500 MG PO TABS
500.0000 mg | ORAL_TABLET | Freq: Every day | ORAL | 1 refills | Status: DC
Start: 2018-05-12 — End: 2018-11-10

## 2018-05-12 NOTE — Progress Notes (Signed)
Reason for visit: Myasthenia gravis  Victor Sutton is an 84 y.o. male  History of present illness:  Victor Sutton is an 84 year old right-handed white male with a history of myasthenia gravis with pharyngeal features.  The patient has done very well, he denies any significant issues with chewing or swallowing.  He has good strength on the arms or legs.  He denies any ptosis or double vision.  He remains on Mestinon and CellCept.  He tolerates these drugs well, he is no longer having diarrhea on the Mestinon.  He just got back from a trip to [place], he did well with this.  He denies any other new medical issues that have come up since last seen, he has had a bout of bronchitis recently.  Past Medical History:  Diagnosis Date  . Adenomatous colon polyp   . B12 deficiency    HIstory  . BPH (benign prostatic hyperplasia)   . Cameron ulcer   . Cancer ([place])    precancerous skin lesions on leg   . Cataract 2009   bilateral cataract surgery  . Delayed gastric emptying   . Disturbance of salivary secretion 06/20/2013  . Dysphagia, pharyngoesophageal phase 03/16/2013  . Dysphonia 03/16/2013  . GERD (gastroesophageal reflux disease)   . Hiatal hernia    hiatial hernia repair  02/08/13  . History of blood transfusion   . Hyperlipidemia    not on medication  . Hypertension   . Iron deficiency anemia   . Leukoplakia 2001   Treated for  . Macular degeneration    (L) wet, (R) dry  . Myasthenia gravis ([place]) 03/21/2013  . Peripheral neuropathy    Mild History of  . Polyneuritis 1965   Resolved  . Ulcer   . Vitamin D deficiency     Past Surgical History:  Procedure Laterality Date  . Arthroscopic knee surgery     left  . cateract surgeries      bilateral  . CHOLECYSTECTOMY    . COLONOSCOPY WITH PROPOFOL N/A 02/16/2017   Procedure: COLONOSCOPY WITH PROPOFOL;  Surgeon: Garlan Fair, MD;  [facility];  Service: Endoscopy;  Laterality: N/A;  . GASTROSTOMY W/ FEEDING TUBE   [id number]  . HERNIA REPAIR     Laparoscopic ventral  . HIATAL HERNIA REPAIR  2004  . HIATAL HERNIA REPAIR N/A 02/08/2013   Procedure:  REPAIR OFCURRENT HIATAL HERNIA, ;  Surgeon: Odis Hollingshead, MD;  [facility];  Service: General;  Laterality: N/A;  . LAPAROSCOPIC LYSIS OF ADHESIONS N/A 02/08/2013   Procedure: LAPAROSCOPIC LYSIS OF ADHESIONS;  Surgeon: Odis Hollingshead, MD;  [facility];  Service: General;  Laterality: N/A;  . LAPAROSCOPIC NISSEN FUNDOPLICATION  4/40/3474   Procedure: LAPAROSCOPIC NISSEN FUNDOPLICATION;  Surgeon: Odis Hollingshead, MD;  [facility];  Service: General;;  . Multiple oral surgeries  2002  . SKIN CANCER EXCISION  10/2012   right leg-shin area  . STOMACH SURGERY  2005   states his stomach had moved up toward his chest , some type of surgery performed  . TONSILLECTOMY    . UPPER GI ENDOSCOPY N/A 02/08/2013   Procedure: UPPER GI ENDOSCOPY;  Surgeon: Odis Hollingshead, MD;  [facility];  Service: General;  Laterality: N/A;    Family History  Problem Relation Age of Onset  . Breast cancer Sister   . CAD Sister   . COPD Sister   . Diabetes Father   . Heart attack Father   .  Colon cancer Son 69  . Prostate cancer Paternal Grandfather   . Colon cancer Son 1    Social history:  reports that he quit smoking about 41 years ago. He has never used smokeless tobacco. He reports that he does not drink alcohol or use drugs.    Allergies  Allergen Reactions  . Ciprofloxacin Anaphylaxis    Avoid due to myasthenia gravis  . Quinolones Anaphylaxis    Avoid due to myasthenia gravis    Medications:  Prior to Admission medications   Medication Sig Start Date End Date Taking? Authorizing Provider  acetaminophen (TYLENOL) 500 MG tablet Take 1,000 mg by mouth daily as needed for moderate pain or headache.   Yes [provider]  BIOTIN PO Take by mouth.   Yes [provider]  CALCIUM PO Take by mouth.   Yes [provider]  hydrochlorothiazide (HYDRODIURIL) 25 MG tablet Take 1 tablet (25 mg total) by mouth daily. 04/07/16  Yes Kathrynn Ducking, MD  Multiple Vitamins-Minerals (CENTRUM SILVER PO) Take 1 tablet by mouth daily.   Yes [provider]  Multiple Vitamins-Minerals (ICAPS) CAPS Take 1 capsule by mouth 2 (two) times daily.   Yes [provider]  mycophenolate (CELLCEPT) 500 MG tablet Take 1 tablet (500 mg total) by mouth daily. 05/12/18  Yes Kathrynn Ducking, MD  Omega-3 Fatty Acids (FISH OIL ULTRA) 1400 MG CAPS Take 1,400 mg by mouth 2 (two) times daily.   Yes [provider]  pantoprazole (PROTONIX) 40 MG tablet Take 40 mg by mouth daily. May take an additional 40mg s as needed for heartburn 09/27/13  Yes [provider]  Polyethyl Glycol-Propyl Glycol (SYSTANE OP) Apply 1 drop to eye as needed (dry eyes).   Yes [provider]  potassium chloride (K-DUR) 10 MEQ tablet TAKE 1 TABLET BY MOUTH 2 TIMES DAILY. Patient taking differently: Take 10 meq by mouth once daily 02/05/17  Yes Kathrynn Ducking, MD  pyridostigmine (MESTINON) 60 MG tablet TAKE 1/2 TABLET BY MOUTH 3 TIMES A DAY. 11/05/17  Yes Kathrynn Ducking, MD  tamsulosin [facility]) 0.4 MG CAPS capsule  10/24/17  Yes [provider]  UNABLE TO FIND Take 1 tablet by mouth daily. Med Name:EyePromise - zeaxanthin 10mg  and lutein 10mg    Yes [provider]  Vitamin D, Ergocalciferol, (DRISDOL) 50000 UNITS CAPS capsule Take 50,000 Units by mouth every 7 (seven) days. ON FRIDAYS   Yes [provider]  zoledronic acid (RECLAST) 5 MG/100ML SOLN injection Inject 5 mg into the vein once. YEARLY   Yes [provider]    ROS:  Out of a complete 14 system review of symptoms, the patient complains only of the following symptoms, and all other reviewed systems are negative.  Leg swelling  Blood pressure 139/85, pulse 67, weight 164 lb 8 oz (74.6 kg).  Physical  Exam  General: The patient is alert and cooperative at the time of the examination.  Skin: 2+ edema below the knees is seen bilaterally.   Neurologic Exam  Mental status: The patient is alert and oriented x 3 at the time of the examination. The patient has apparent normal recent and remote memory, with an apparently normal attention span and concentration ability.   Cranial nerves: Facial symmetry is present. Speech is normal, no aphasia or dysarthria is noted. Extraocular movements are full. Visual fields are full.  Motor: The patient has good strength in all 4 extremities.  Sensory examination: Soft touch sensation is  symmetric on the face, arms, and legs.  Coordination: The patient has good finger-nose-finger and heel-to-shin bilaterally.  Gait and station: The patient has a slightly unsteady gait. Romberg is negative. No drift is seen.  Reflexes: Deep tendon reflexes are symmetric.   Assessment/Plan:  1.  Myasthenia gravis, pharyngeal features  The patient will continue the Mestinon and CellCept, a prescription for the CellCept was sent in.  He will have blood work done today, he will follow-up in 6 months.  Jill Alexanders MD 05/12/2018 11:59 AM  [facility] [address] [place], [place] [address]  Phone [phone] Fax [phone]

## 2018-05-13 ENCOUNTER — Other Ambulatory Visit: Payer: Self-pay | Admitting: Neurology

## 2018-05-13 LAB — CBC WITH DIFFERENTIAL/PLATELET
Basophils Absolute: 0 10*3/uL (ref 0.0–0.2)
Basos: 0 %
EOS (ABSOLUTE): 0.2 10*3/uL (ref 0.0–0.4)
Eos: 3 %
Hematocrit: 42.3 % (ref 37.5–51.0)
Hemoglobin: 14.3 g/dL (ref 13.0–17.7)
Immature Grans (Abs): 0 10*3/uL (ref 0.0–0.1)
Immature Granulocytes: 0 %
Lymphocytes Absolute: 1.3 10*3/uL (ref 0.7–3.1)
Lymphs: 22 %
MCH: 32.7 pg (ref 26.6–33.0)
MCHC: 33.8 g/dL (ref 31.5–35.7)
MCV: 97 fL (ref 79–97)
Monocytes Absolute: 0.5 10*3/uL (ref 0.1–0.9)
Monocytes: 9 %
Neutrophils Absolute: 3.8 10*3/uL (ref 1.4–7.0)
Neutrophils: 66 %
Platelets: 232 10*3/uL (ref 150–450)
RBC: 4.37 x10E6/uL (ref 4.14–5.80)
RDW: 12.1 % — ABNORMAL LOW (ref 12.3–15.4)
WBC: 5.8 10*3/uL (ref 3.4–10.8)

## 2018-05-13 LAB — COMPREHENSIVE METABOLIC PANEL
ALT: 16 IU/L (ref 0–44)
AST: 24 IU/L (ref 0–40)
Albumin/Globulin Ratio: 1.6 (ref 1.2–2.2)
Albumin: 3.9 g/dL (ref 3.5–4.7)
Alkaline Phosphatase: 41 IU/L (ref 39–117)
BUN/Creatinine Ratio: 13 (ref 10–24)
BUN: 10 mg/dL (ref 8–27)
Bilirubin Total: 1.4 mg/dL — ABNORMAL HIGH (ref 0.0–1.2)
CO2: 27 mmol/L (ref 20–29)
Calcium: 9 mg/dL (ref 8.6–10.2)
Chloride: 101 mmol/L (ref 96–106)
Creatinine, Ser: 0.79 mg/dL (ref 0.76–1.27)
GFR calc Af Amer: 93 mL/min/{1.73_m2} (ref >59)
GFR calc non Af Amer: 81 mL/min/{1.73_m2} (ref >59)
Globulin, Total: 2.4 g/dL (ref 1.5–4.5)
Glucose: 87 mg/dL (ref 65–99)
Potassium: 4.3 mmol/L (ref 3.5–5.2)
Sodium: 141 mmol/L (ref 134–144)
Total Protein: 6.3 g/dL (ref 6.0–8.5)

## 2018-05-19 DIAGNOSIS — R05 Cough: Secondary | ICD-10-CM | POA: Diagnosis not present

## 2018-05-19 DIAGNOSIS — J209 Acute bronchitis, unspecified: Secondary | ICD-10-CM | POA: Diagnosis not present

## 2018-05-19 DIAGNOSIS — I10 Essential (primary) hypertension: Secondary | ICD-10-CM | POA: Diagnosis not present

## 2018-05-19 DIAGNOSIS — G7000 Myasthenia gravis without (acute) exacerbation: Secondary | ICD-10-CM | POA: Diagnosis not present

## 2018-06-05 ENCOUNTER — Ambulatory Visit (HOSPITAL_COMMUNITY)
Admission: EM | Admit: 2018-06-05 | Discharge: 2018-06-05 | Disposition: A | Discharge status: Home / Self Care | Payer: Medicare Other | Attending: Internal Medicine | Admitting: Internal Medicine

## 2018-06-05 ENCOUNTER — Encounter (HOSPITAL_COMMUNITY): Payer: Self-pay

## 2018-06-05 ENCOUNTER — Ambulatory Visit (INDEPENDENT_AMBULATORY_CARE_PROVIDER_SITE_OTHER): Payer: Medicare Other

## 2018-06-05 DIAGNOSIS — W19XXXA Unspecified fall, initial encounter: Secondary | ICD-10-CM | POA: Diagnosis not present

## 2018-06-05 DIAGNOSIS — S2231XA Fracture of one rib, right side, initial encounter for closed fracture: Secondary | ICD-10-CM

## 2018-06-05 DIAGNOSIS — I4891 Unspecified atrial fibrillation: Secondary | ICD-10-CM

## 2018-06-05 DIAGNOSIS — S50311A Abrasion of right elbow, initial encounter: Secondary | ICD-10-CM | POA: Diagnosis not present

## 2018-06-05 DIAGNOSIS — S51011A Laceration without foreign body of right elbow, initial encounter: Secondary | ICD-10-CM

## 2018-06-05 MED ORDER — MUPIROCIN 2 % EX OINT: 1.0000 "application " | TOPICAL_OINTMENT | Freq: Two times a day (BID) | CUTANEOUS | 0 refills | Status: DC

## 2018-06-05 NOTE — ED Provider Notes (Signed)
[place]    CSN: [id number] Arrival date & time: 06/05/18  1002     History   Chief Complaint Chief Complaint  Patient presents with  . Fall    HPI Victor Sutton is a 84 y.o. male history of myasthenia gravis, hypertension, A. fib, presenting today for evaluation of right flank pain after a fall.  Patient states that Thursday evening he was standing up out of his recliner holding a bowl, he got off balance and fell onto his right side.  He denies loss of consciousness, hitting head.  Denies headache.  Denies vision changes, nausea, vomiting.  Denies chest pain or shortness of breath.  Last night he noticed worsening discomfort in his right side especially with deep breaths.  Pain located in right lower chest/right upper quadrant, as well as pain that extends onto the right lower thoracic area.  Denies fever, cough.  He also notes that he developed skin tears to his right forearm, his wife and him has been cleansing this with warm soapy water and applying Neosporin.  Wife denies mental status changes from his baseline.  HPI  Past Medical History:  Diagnosis Date  . Adenomatous colon polyp   . B12 deficiency    HIstory  . BPH (benign prostatic hyperplasia)   . Cameron ulcer   . Cancer ([place])    precancerous skin lesions on leg   . Cataract 2009   bilateral cataract surgery  . Delayed gastric emptying   . Disturbance of salivary secretion 06/20/2013  . Dysphagia, pharyngoesophageal phase 03/16/2013  . Dysphonia 03/16/2013  . GERD (gastroesophageal reflux disease)   . Hiatal hernia    hiatial hernia repair  02/08/13  . History of blood transfusion   . Hyperlipidemia    not on medication  . Hypertension   . Iron deficiency anemia   . Leukoplakia 2001   Treated for  . Macular degeneration    (L) wet, (R) dry  . Myasthenia gravis ([place]) 03/21/2013  . Peripheral neuropathy    Mild History of  . Polyneuritis 1965   Resolved  . Ulcer   . Vitamin D deficiency      Patient Active Problem List   Diagnosis Date Noted  . Disturbance of salivary secretion 06/20/2013  . Myasthenia gravis ([place]) 03/21/2013  . Dysphonia 03/16/2013  . Dysphagia, pharyngoesophageal phase 03/16/2013  . Dysphagia, oropharyngeal phase 03/07/2013  . Aspiration into airway 02/12/2013  . Protein-calorie malnutrition, severe ([place]) 02/11/2013  . Atrial fibrillation ([place]) 02/08/2013  . Acute respiratory failure ([place]) 02/08/2013  . GERD (gastroesophageal reflux disease)   . Hiatal hernia-recurrent s/p laparoscopic repair and Nissen fundoplication on 2/67/12 45/80/9983  . Bell's palsy 01/01/2013  . Iron deficiency anemia 09/01/2012  . S/P repair of paraesophageal hernia 09/01/2012  . Hx of adenomatous colonic polyps 09/01/2012  . BPH (benign prostatic hyperplasia) 09/01/2012  . B12 deficiency 09/01/2012  . Macular degeneration 09/01/2012  . S/P cholecystectomy 09/01/2012    Past Surgical History:  Procedure Laterality Date  . Arthroscopic knee surgery     left  . cateract surgeries      bilateral  . CHOLECYSTECTOMY    . COLONOSCOPY WITH PROPOFOL N/A 02/16/2017   Procedure: COLONOSCOPY WITH PROPOFOL;  Surgeon: Garlan Fair, MD;  Location: [facility];  Service: Endoscopy;  Laterality: N/A;  . GASTROSTOMY W/ FEEDING TUBE  [id number]  . HERNIA REPAIR     Laparoscopic ventral  . HIATAL HERNIA REPAIR  2004  . HIATAL HERNIA REPAIR  N/A 02/08/2013   Procedure:  REPAIR OFCURRENT HIATAL HERNIA, ;  Surgeon: Odis Hollingshead, MD;  Location: [facility];  Service: General;  Laterality: N/A;  . LAPAROSCOPIC LYSIS OF ADHESIONS N/A 02/08/2013   Procedure: LAPAROSCOPIC LYSIS OF ADHESIONS;  Surgeon: Odis Hollingshead, MD;  [facility];  Service: General;  Laterality: N/A;  . LAPAROSCOPIC NISSEN FUNDOPLICATION  9/56/2130   Procedure: LAPAROSCOPIC NISSEN FUNDOPLICATION;  Surgeon: Odis Hollingshead, MD;  [facility];  Service: General;;  . Multiple oral surgeries  2002  . SKIN  CANCER EXCISION  10/2012   right leg-shin area  . STOMACH SURGERY  2005   states his stomach had moved up toward his chest , some type of surgery performed  . TONSILLECTOMY    . UPPER GI ENDOSCOPY N/A 02/08/2013   Procedure: UPPER GI ENDOSCOPY;  Surgeon: Odis Hollingshead, MD;  [facility];  Service: General;  Laterality: N/A;       Home Medications    Prior to Admission medications   Medication Sig Start Date End Date Taking? Authorizing Provider  acetaminophen (TYLENOL) 500 MG tablet Take 1,000 mg by mouth daily as needed for moderate pain or headache.    [provider]  BIOTIN PO Take by mouth.    [provider]  CALCIUM PO Take by mouth.    [provider]  hydrochlorothiazide (HYDRODIURIL) 25 MG tablet Take 1 tablet (25 mg total) by mouth daily. 04/07/16   Kathrynn Ducking, MD  Multiple Vitamins-Minerals (CENTRUM SILVER PO) Take 1 tablet by mouth daily.    [provider]  Multiple Vitamins-Minerals (ICAPS) CAPS Take 1 capsule by mouth 2 (two) times daily.    [provider]  mupirocin ointment (BACTROBAN) 2 % Apply 1 application topically 2 (two) times daily. 06/05/18   Aryani Daffern C, PA-C  mycophenolate (CELLCEPT) 500 MG tablet Take 1 tablet (500 mg total) by mouth daily. 05/12/18   Kathrynn Ducking, MD  Omega-3 Fatty Acids (FISH OIL ULTRA) 1400 MG CAPS Take 1,400 mg by mouth 2 (two) times daily.    [provider]  pantoprazole (PROTONIX) 40 MG tablet Take 40 mg by mouth daily. May take an additional 40mg s as needed for heartburn 09/27/13   [provider]  Polyethyl Glycol-Propyl Glycol (SYSTANE OP) Apply 1 drop to eye as needed (dry eyes).    [provider]  potassium chloride (K-DUR) 10 MEQ tablet Take 10 meq by mouth once daily 05/13/18   Kathrynn Ducking, MD  pyridostigmine (MESTINON) 60 MG tablet TAKE 1/2 TABLET BY MOUTH 3 TIMES A DAY. 11/05/17   Kathrynn Ducking, MD  tamsulosin [facility]) 0.4 MG  CAPS capsule  10/24/17   [provider]  UNABLE TO FIND Take 1 tablet by mouth daily. Med Name:EyePromise - zeaxanthin 10mg  and lutein 10mg     [provider]  Vitamin D, Ergocalciferol, (DRISDOL) 50000 UNITS CAPS capsule Take 50,000 Units by mouth every 7 (seven) days. ON FRIDAYS    [provider]  zoledronic acid (RECLAST) 5 MG/100ML SOLN injection Inject 5 mg into the vein once. YEARLY    [provider]    Family History Family History  Problem Relation Age of Onset  . Breast cancer Sister   . CAD Sister   . COPD Sister   . Diabetes Father   . Heart attack Father   . Colon cancer Son 13  . Prostate cancer Paternal Grandfather   . Colon cancer Son  41    Social History Social History   Tobacco Use  . Smoking status: Former Smoker    Last attempt to quit: 09/15/1976    Years since quitting: 41.7  . Smokeless tobacco: Never Used  . Tobacco comment: Quit 1980  Substance Use Topics  . Alcohol use: No    Comment: Moderate alcohol, wine  . Drug use: No     Allergies   Ciprofloxacin and Quinolones   Review of Systems Review of Systems  Constitutional: Negative for fatigue and fever.  HENT: Negative for congestion, sinus pressure and sore throat.   Eyes: Negative for photophobia, pain and visual disturbance.  Respiratory: Negative for cough and shortness of breath.   Cardiovascular: Negative for chest pain.  Gastrointestinal: Negative for abdominal pain, nausea and vomiting.  Genitourinary: Negative for decreased urine volume and hematuria.  Musculoskeletal: Positive for back pain and myalgias. Negative for neck pain and neck stiffness.  Skin: Positive for color change and wound.  Neurological: Positive for headaches. Negative for dizziness, syncope, facial asymmetry, speech difficulty, weakness, light-headedness and numbness.     Physical Exam Triage Vital Signs ED Triage Vitals [06/05/18 1021]  Enc Vitals Group     BP (!) 145/84      Pulse Rate 80     Resp 20     Temp 97.9 F (36.6 C)     Temp Source Oral     SpO2 99 %     Weight      Height      Head Circumference      Peak Flow      Pain Score      Pain Loc      Pain Edu?      Excl. in [place]?    No data found.  Updated Vital Signs BP (!) 145/84 (BP Location: Left Arm)   Pulse 80   Temp 97.9 F (36.6 C) (Oral)   Resp 20   SpO2 99%   Visual Acuity Right Eye Distance:   Left Eye Distance:   Bilateral Distance:    Right Eye Near:   Left Eye Near:    Bilateral Near:     Physical Exam  Constitutional: He is oriented to person, place, and time. He appears well-developed and well-nourished.  HENT:  Head: Normocephalic and atraumatic.  Oral mucosa pink and moist, no tonsillar enlargement or exudate. Posterior pharynx patent and nonerythematous, no uvula deviation or swelling. Normal phonation.  Eyes: Pupils are equal, round, and reactive to light. Conjunctivae and EOM are normal.  Neck: Neck supple.  Cardiovascular:  No murmur heard. Irregularly irregular rhythm  Pulmonary/Chest: Effort normal and breath sounds normal. No respiratory distress.  Breathing comfortably at rest, CTABL, no wheezing, rales or other adventitious sounds auscultated  Bruising to lower right thoracic back, tenderness around this area extending anteriorly onto the flank  Abdominal: Soft. There is no tenderness.  Musculoskeletal: He exhibits no edema.  Neurological: He is alert and oriented to person, place, and time.  Patient A&O x3, cranial nerves II-XII grossly intact, strength at shoulders, hips and knees 5/5, equal bilaterally, patellar reflex difficult to obtain bilaterally. Gait without abnormality.  Skin: Skin is warm and dry.  Large linear skin tear to right forearm, bleeding controlled  Psychiatric: He has a normal mood and affect.  Nursing note and vitals reviewed.    UC Treatments / Results  Labs (all labs ordered are listed, but only abnormal results are  displayed) Labs Reviewed - No data  to display  EKG None  Radiology Dg Ribs Unilateral W/chest Right  Result Date: 06/05/2018 CLINICAL DATA:  Patient states that he fell x 3 days ago injuring his right side, struck his rt ribs against coffee table when he fell. Hx of hiatal hernia,htn. Former smoker. EXAM: RIGHT RIBS AND CHEST - 3+ VIEW COMPARISON:  Chest CT, 07/25/2015 FINDINGS: There is a nondisplaced fracture of the right lateral ninth rib. No other fractures. No bone lesions. Mild right lung base opacity consistent with atelectasis. Lungs otherwise clear. No pleural effusion or pneumothorax. Heart normal in size.  No mediastinal or hilar masses. IMPRESSION: 1. Nondisplaced right lateral ninth rib fracture. 2. No acute cardiopulmonary disease. Electronically Signed   By: Lajean Manes M.D.   On: 06/05/2018 11:06    Procedures Procedures (including critical care time)  Medications Ordered in UC Medications - No data to display  Initial Impression / Assessment and Plan / UC Course  I have reviewed the triage vital signs and the nursing notes.  Pertinent labs & imaging results that were available during my care of the patient were reviewed by me and considered in my medical decision making (see chart for details).     Nondisplaced fracture of the right ninth rib, discussed precautions regarding rib fractures.  Would expect spontaneous healing. Skin tear cleansed with Shur-Clens, petroleum dressing applied with nonadherent dressing and Covan.  Discussed continuing to monitor for development of infection and gradual healing.  May apply Bactroban twice daily.  Patient seems to be in A. fib, he has a history of this, rate is controlled, he is not on any blood thinners.  Unclear history of if he has paroxysmal versus persistent A. fib.  Given he has a history of A. fib chart and last EKG showing A. fib, discussed having patient follow-up with primary care.  Signs of stroke discussed with  patient and wife advised to monitor.  Also recommended beginning daily aspirin.  Discussed strict return precautions. Patient verbalized understanding and is agreeable with plan.  Final Clinical Impressions(s) / UC Diagnoses   Final diagnoses:  Closed traumatic nondisplaced fracture of one rib of right side, initial encounter  Skin tear of right elbow without complication, initial encounter     Discharge Instructions     Fracture of ninth rib on right side, please work on taking deep breath/coughing occasionally in order to fully expand lungs. Rib fractures but she had a high risk of developing pneumonia, if you develop fever, worsening discomfort, cough, shortness of breath please follow-up  Please continue to monitor your skin tears for healing, you may apply Bactroban twice daily Please return if developing redness, swelling or drainage from your wounds   ED Prescriptions    Medication Sig Dispense Auth. Provider   mupirocin ointment (BACTROBAN) 2 % Apply 1 application topically 2 (two) times daily. 22 g Bellamy Rubey, [place] C, PA-C     Controlled Substance Prescriptions [place] Controlled Substance Registry consulted? Not Applicable   Janith Lima, [place] 06/05/18 1209

## 2018-06-05 NOTE — Discharge Instructions (Addendum)
Fracture of ninth rib on right side, please work on taking deep breath/coughing occasionally in order to fully expand lungs. Rib fractures but she had a high risk of developing pneumonia, if you develop fever, worsening discomfort, cough, shortness of breath please follow-up  Please continue to monitor your skin tears for healing, you may apply Bactroban twice daily Please return if developing redness, swelling or drainage from your wounds

## 2018-06-05 NOTE — ED Triage Notes (Signed)
Pt presents with injuries from a fall Thursday night; Pt has right side rib pain and 2 large abrasions/skin tears on right underside of arm.

## 2018-06-08 DIAGNOSIS — Z23 Encounter for immunization: Secondary | ICD-10-CM | POA: Diagnosis not present

## 2018-06-08 DIAGNOSIS — I499 Cardiac arrhythmia, unspecified: Secondary | ICD-10-CM | POA: Diagnosis not present

## 2018-06-08 DIAGNOSIS — S2231XA Fracture of one rib, right side, initial encounter for closed fracture: Secondary | ICD-10-CM | POA: Diagnosis not present

## 2018-06-08 DIAGNOSIS — Z6824 Body mass index (BMI) 24.0-24.9, adult: Secondary | ICD-10-CM | POA: Diagnosis not present

## 2018-10-12 DIAGNOSIS — R82998 Other abnormal findings in urine: Secondary | ICD-10-CM | POA: Diagnosis not present

## 2018-10-12 DIAGNOSIS — I10 Essential (primary) hypertension: Secondary | ICD-10-CM | POA: Diagnosis not present

## 2018-10-12 DIAGNOSIS — E538 Deficiency of other specified B group vitamins: Secondary | ICD-10-CM | POA: Diagnosis not present

## 2018-10-12 DIAGNOSIS — Z125 Encounter for screening for malignant neoplasm of prostate: Secondary | ICD-10-CM | POA: Diagnosis not present

## 2018-10-12 DIAGNOSIS — M810 Age-related osteoporosis without current pathological fracture: Secondary | ICD-10-CM | POA: Diagnosis not present

## 2018-10-18 DIAGNOSIS — Z1212 Encounter for screening for malignant neoplasm of rectum: Secondary | ICD-10-CM | POA: Diagnosis not present

## 2018-10-20 DIAGNOSIS — Z6824 Body mass index (BMI) 24.0-24.9, adult: Secondary | ICD-10-CM | POA: Diagnosis not present

## 2018-10-20 DIAGNOSIS — I10 Essential (primary) hypertension: Secondary | ICD-10-CM | POA: Diagnosis not present

## 2018-10-20 DIAGNOSIS — I2510 Atherosclerotic heart disease of native coronary artery without angina pectoris: Secondary | ICD-10-CM | POA: Diagnosis not present

## 2018-10-20 DIAGNOSIS — L821 Other seborrheic keratosis: Secondary | ICD-10-CM | POA: Diagnosis not present

## 2018-10-20 DIAGNOSIS — L570 Actinic keratosis: Secondary | ICD-10-CM | POA: Diagnosis not present

## 2018-10-20 DIAGNOSIS — Z0000 Encounter for general adult medical examination without abnormal findings: Secondary | ICD-10-CM | POA: Diagnosis not present

## 2018-10-20 DIAGNOSIS — M810 Age-related osteoporosis without current pathological fracture: Secondary | ICD-10-CM | POA: Diagnosis not present

## 2018-10-20 DIAGNOSIS — G7000 Myasthenia gravis without (acute) exacerbation: Secondary | ICD-10-CM | POA: Diagnosis not present

## 2018-11-10 ENCOUNTER — Other Ambulatory Visit: Payer: Self-pay | Admitting: Neurology

## 2018-11-12 ENCOUNTER — Other Ambulatory Visit: Payer: Self-pay | Admitting: Neurology

## 2018-11-27 ENCOUNTER — Other Ambulatory Visit: Payer: Self-pay | Admitting: Neurology

## 2018-12-02 ENCOUNTER — Ambulatory Visit: Payer: Medicare Other | Admitting: Neurology

## 2018-12-02 ENCOUNTER — Encounter: Payer: Self-pay | Admitting: Neurology

## 2018-12-02 ENCOUNTER — Other Ambulatory Visit: Payer: Self-pay

## 2018-12-02 VITALS — BP 136/75 | HR 65 | Ht 70.0 in | Wt 166.3 lb

## 2018-12-02 DIAGNOSIS — G7000 Myasthenia gravis without (acute) exacerbation: Secondary | ICD-10-CM | POA: Diagnosis not present

## 2018-12-02 NOTE — Progress Notes (Signed)
Reason for visit: Myasthenia gravis  Victor Sutton is an 85 y.o. male  History of present illness:  [name] is an 85 year old right-handed white male with a history of myasthenia gravis with pharyngeal features.  He has done quite well on Mestinon and CellCept, he is not on prednisone.  The patient denies any problems with swallowing or choking or difficulty with chewing or alteration in speech.  He denies any double vision or ptosis or any weakness of the arms or legs.  He occasionally will have difficulty with a large pill getting stuck in his throat when he tries to swallow, but this is not very frequent.  The patient has had blood work done recently through his primary care physician in late January 2020.  Liver profile is unremarkable, white blood count is 5.1, hemoglobin 15.0, platelets are 188.  He returns for an evaluation.  Past Medical History:  Diagnosis Date  . Adenomatous colon polyp   . B12 deficiency    HIstory  . BPH (benign prostatic hyperplasia)   . Cameron ulcer   . Cancer ([place])    precancerous skin lesions on leg   . Cataract 2009   bilateral cataract surgery  . Delayed gastric emptying   . Disturbance of salivary secretion 06/20/2013  . Dysphagia, pharyngoesophageal phase 03/16/2013  . Dysphonia 03/16/2013  . GERD (gastroesophageal reflux disease)   . Hiatal hernia    hiatial hernia repair  02/08/13  . History of blood transfusion   . Hyperlipidemia    not on medication  . Hypertension   . Iron deficiency anemia   . Leukoplakia 2001   Treated for  . Macular degeneration    (L) wet, (R) dry  . Myasthenia gravis ([place]) 03/21/2013  . Peripheral neuropathy    Mild History of  . Polyneuritis 1965   Resolved  . Ulcer   . Vitamin D deficiency     Past Surgical History:  Procedure Laterality Date  . Arthroscopic knee surgery     left  . cateract surgeries      bilateral  . CHOLECYSTECTOMY    . COLONOSCOPY WITH PROPOFOL N/A 02/16/2017   Procedure:  COLONOSCOPY WITH PROPOFOL;  Surgeon: Garlan Fair, MD;  [facility];  Service: Endoscopy;  Laterality: N/A;  . GASTROSTOMY W/ FEEDING TUBE  [id number]  . HERNIA REPAIR     Laparoscopic ventral  . HIATAL HERNIA REPAIR  2004  . HIATAL HERNIA REPAIR N/A 02/08/2013   Procedure:  REPAIR OFCURRENT HIATAL HERNIA, ;  Surgeon: Odis Hollingshead, MD;  [facility];  Service: General;  Laterality: N/A;  . LAPAROSCOPIC LYSIS OF ADHESIONS N/A 02/08/2013   Procedure: LAPAROSCOPIC LYSIS OF ADHESIONS;  Surgeon: Odis Hollingshead, MD;  [facility];  Service: General;  Laterality: N/A;  . LAPAROSCOPIC NISSEN FUNDOPLICATION  05/12/33   Procedure: LAPAROSCOPIC NISSEN FUNDOPLICATION;  Surgeon: Odis Hollingshead, MD;  [facility];  Service: General;;  . Multiple oral surgeries  2002  . SKIN CANCER EXCISION  10/2012   right leg-shin area  . STOMACH SURGERY  2005   states his stomach had moved up toward his chest , some type of surgery performed  . TONSILLECTOMY    . UPPER GI ENDOSCOPY N/A 02/08/2013   Procedure: UPPER GI ENDOSCOPY;  Surgeon: Odis Hollingshead, MD;  [facility];  Service: General;  Laterality: N/A;    Family History  Problem Relation Age of Onset  . Breast cancer Sister   .  CAD Sister   . COPD Sister   . Diabetes Father   . Heart attack Father   . Colon cancer Son 70  . Prostate cancer Paternal Grandfather   . Colon cancer Son 80    Social history:  reports that he quit smoking about 42 years ago. He has never used smokeless tobacco. He reports that he does not drink alcohol or use drugs.    Allergies  Allergen Reactions  . Ciprofloxacin Anaphylaxis    Avoid due to myasthenia gravis  . Quinolones Anaphylaxis    Avoid due to myasthenia gravis    Medications:  Prior to Admission medications   Medication Sig Start Date End Date Taking? Authorizing Provider  acetaminophen (TYLENOL) 500 MG tablet Take 1,000 mg by mouth daily as needed for moderate  pain or headache.   Yes [provider]  BIOTIN PO Take by mouth.   Yes [provider]  CALCIUM PO Take by mouth.   Yes [provider]  hydrochlorothiazide (HYDRODIURIL) 25 MG tablet Take 1 tablet (25 mg total) by mouth daily. 04/07/16  Yes Kathrynn Ducking, MD  Multiple Vitamins-Minerals (CENTRUM SILVER PO) Take 1 tablet by mouth daily.   Yes [provider]  Multiple Vitamins-Minerals (ICAPS) CAPS Take 1 capsule by mouth 2 (two) times daily.   Yes [provider]  mupirocin ointment (BACTROBAN) 2 % Apply 1 application topically 2 (two) times daily. 06/05/18  Yes Wieters, Hallie C, PA-C  mycophenolate (CELLCEPT) 500 MG tablet TAKE 1 TABLET (500 MG TOTAL) BY MOUTH DAILY. 11/10/18  Yes Kathrynn Ducking, MD  Omega-3 Fatty Acids (FISH OIL ULTRA) 1400 MG CAPS Take 1,400 mg by mouth 2 (two) times daily.   Yes [provider]  pantoprazole (PROTONIX) 40 MG tablet Take 40 mg by mouth daily. May take an additional 40mg s as needed for heartburn 09/27/13  Yes [provider]  Polyethyl Glycol-Propyl Glycol (SYSTANE OP) Apply 1 drop to eye as needed (dry eyes).   Yes [provider]  potassium chloride (K-DUR) 10 MEQ tablet Take 10 meq by mouth once daily 05/13/18  Yes Kathrynn Ducking, MD  pyridostigmine (MESTINON) 60 MG tablet TAKE 1/2 TABLET BY MOUTH 3 TIMES A DAY. 11/12/18  Yes Kathrynn Ducking, MD  tamsulosin [facility]) 0.4 MG CAPS capsule  10/24/17  Yes [provider]  UNABLE TO FIND Take 1 tablet by mouth daily. Med Name:EyePromise - zeaxanthin 10mg  and lutein 10mg    Yes [provider]  Vitamin D, Ergocalciferol, (DRISDOL) 50000 UNITS CAPS capsule Take 50,000 Units by mouth every 7 (seven) days. ON FRIDAYS   Yes [provider]  zoledronic acid (RECLAST) 5 MG/100ML SOLN injection Inject 5 mg into the vein once. YEARLY   Yes [provider]    ROS:  Out of a complete 14 system review of symptoms,  the patient complains only of the following symptoms, and all other reviewed systems are negative.  Snoring Bruising easily Cold intolerance  Blood pressure 136/75, pulse 65, height 5\' 10"  (1.778 m), weight 166 lb 5 oz (75.4 kg).  Physical Exam  General: The patient is alert and cooperative at the time of the examination.  Skin: No significant peripheral edema is noted.   Neurologic Exam  Mental status: The patient is alert and oriented x 3 at the time of the examination. The patient has apparent normal recent and remote memory, with an apparently normal attention span and concentration ability.   Cranial nerves: Facial symmetry  is present. Speech is normal, no aphasia or dysarthria is noted. Extraocular movements are full. Visual fields are full.  Motor: The patient has good strength in all 4 extremities.  Sensory examination: Soft touch sensation is symmetric on the face, arms, and legs.  Coordination: The patient has good finger-nose-finger and heel-to-shin bilaterally.  Gait and station: The patient has a normal gait. Tandem gait is unsteady. Romberg is negative, but is unsteady. No drift is seen.  Reflexes: Deep tendon reflexes are symmetric.   Assessment/Plan:  1.  Myasthenia gravis with pharyngeal features  The patient has done quite well on medication, he will continue his CellCept and Mestinon at the current dose, he has already had recent blood work done, he will follow-up here in 6 months.  Jill Alexanders MD 12/02/2018 11:20 AM  [facility] [address] [place], [place] [address]  Phone [phone] Fax [phone]

## 2019-03-16 ENCOUNTER — Other Ambulatory Visit: Payer: Self-pay | Admitting: Neurology

## 2019-03-16 NOTE — Telephone Encounter (Signed)
Just wanted to double check to see if this should be approved. I did not see any notes from previous o/v about refill for potassium coming from our clinic.

## 2019-04-13 ENCOUNTER — Other Ambulatory Visit: Payer: Self-pay | Admitting: Internal Medicine

## 2019-04-13 DIAGNOSIS — K862 Cyst of pancreas: Secondary | ICD-10-CM

## 2019-04-27 DIAGNOSIS — H524 Presbyopia: Secondary | ICD-10-CM | POA: Diagnosis not present

## 2019-05-07 ENCOUNTER — Ambulatory Visit
Admission: RE | Admit: 2019-05-07 | Discharge: 2019-05-07 | Disposition: A | Discharge status: Home / Self Care | Payer: Medicare Other | Source: Ambulatory Visit | Attending: Internal Medicine | Admitting: Internal Medicine

## 2019-05-07 ENCOUNTER — Other Ambulatory Visit: Payer: Self-pay

## 2019-05-07 DIAGNOSIS — K869 Disease of pancreas, unspecified: Secondary | ICD-10-CM | POA: Diagnosis not present

## 2019-05-07 DIAGNOSIS — K862 Cyst of pancreas: Secondary | ICD-10-CM

## 2019-05-07 MED ORDER — GADOBENATE DIMEGLUMINE 529 MG/ML IV SOLN
15.0000 mL | Freq: Once | INTRAVENOUS | Status: AC | PRN
  Administered 2019-05-07: 15 mL via INTRAVENOUS

## 2019-05-26 DIAGNOSIS — Z23 Encounter for immunization: Secondary | ICD-10-CM | POA: Diagnosis not present

## 2019-05-28 ENCOUNTER — Other Ambulatory Visit: Payer: Self-pay | Admitting: Neurology

## 2019-06-01 ENCOUNTER — Ambulatory Visit: Payer: Medicare Other | Admitting: Neurology

## 2019-06-01 ENCOUNTER — Encounter: Payer: Self-pay | Admitting: Neurology

## 2019-06-01 ENCOUNTER — Other Ambulatory Visit: Payer: Self-pay

## 2019-06-01 VITALS — BP 139/82 | HR 68 | Temp 97.8°F | Ht 69.0 in | Wt 161.3 lb

## 2019-06-01 DIAGNOSIS — G7000 Myasthenia gravis without (acute) exacerbation: Secondary | ICD-10-CM

## 2019-06-01 DIAGNOSIS — Z5181 Encounter for therapeutic drug level monitoring: Secondary | ICD-10-CM

## 2019-06-01 NOTE — Progress Notes (Signed)
Reason for visit: Myasthenia gravis  Victor Sutton is an 85 y.o. male  History of present illness:  Victor Sutton is an 85 year old right-handed white male with a history of myasthenia gravis with pharyngeal onset.  He has done quite well on a very low-dose of CellCept, he is on Mestinon taking one half of a 60 mg tablet 3 times daily but he has had some intermittent episodes of diarrhea and he has indigestion problems over the last week or so.  The patient will occasionally have spells of feeling weak, he feels better when he is active.  The patient denies any significant issues with swallowing or choking.  Overall, he has done well on medical therapy.  He returns for an evaluation.  Past Medical History:  Diagnosis Date  . Adenomatous colon polyp   . B12 deficiency    HIstory  . BPH (benign prostatic hyperplasia)   . Cameron ulcer   . Cancer ([place])    precancerous skin lesions on leg   . Cataract 2009   bilateral cataract surgery  . Delayed gastric emptying   . Disturbance of salivary secretion 06/20/2013  . Dysphagia, pharyngoesophageal phase 03/16/2013  . Dysphonia 03/16/2013  . GERD (gastroesophageal reflux disease)   . Hiatal hernia    hiatial hernia repair  02/08/13  . History of blood transfusion   . Hyperlipidemia    not on medication  . Hypertension   . Iron deficiency anemia   . Leukoplakia 2001   Treated for  . Macular degeneration    (L) wet, (R) dry  . Myasthenia gravis ([place]) 03/21/2013  . Peripheral neuropathy    Mild History of  . Polyneuritis 1965   Resolved  . Ulcer   . Vitamin D deficiency     Past Surgical History:  Procedure Laterality Date  . Arthroscopic knee surgery     left  . cateract surgeries      bilateral  . CHOLECYSTECTOMY    . COLONOSCOPY WITH PROPOFOL N/A 02/16/2017   Procedure: COLONOSCOPY WITH PROPOFOL;  Surgeon: Garlan Fair, MD;  [facility]: [facility];  Service: Endoscopy;  Laterality: N/A;  . GASTROSTOMY W/ FEEDING TUBE   [id number]  . HERNIA REPAIR     Laparoscopic ventral  . HIATAL HERNIA REPAIR  2004  . HIATAL HERNIA REPAIR N/A 02/08/2013   Procedure:  REPAIR OFCURRENT HIATAL HERNIA, ;  Surgeon: Odis Hollingshead, MD;  [facility];  Service: General;  Laterality: N/A;  . LAPAROSCOPIC LYSIS OF ADHESIONS N/A 02/08/2013   Procedure: LAPAROSCOPIC LYSIS OF ADHESIONS;  Surgeon: Odis Hollingshead, MD;  [facility];  Service: General;  Laterality: N/A;  . LAPAROSCOPIC NISSEN FUNDOPLICATION  [id number]   Procedure: LAPAROSCOPIC NISSEN FUNDOPLICATION;  Surgeon: Odis Hollingshead, MD;  [facility];  Service: General;;  . Multiple oral surgeries  2002  . SKIN CANCER EXCISION  10/2012   right leg-shin area  . STOMACH SURGERY  2005   states his stomach had moved up toward his chest , some type of surgery performed  . TONSILLECTOMY    . UPPER GI ENDOSCOPY N/A 02/08/2013   Procedure: UPPER GI ENDOSCOPY;  Surgeon: Odis Hollingshead, MD;  [facility];  Service: General;  Laterality: N/A;    Family History  Problem Relation Age of Onset  . Breast cancer Sister   . CAD Sister   . COPD Sister   . Diabetes Father   . Heart attack Father   . Colon  cancer Son 88  . Prostate cancer Paternal Grandfather   . Colon cancer Son 37    Social history:  reports that he quit smoking about 42 years ago. He has never used smokeless tobacco. He reports that he does not drink alcohol or use drugs.    Allergies  Allergen Reactions  . Ciprofloxacin Anaphylaxis    Avoid due to myasthenia gravis  . Quinolones Anaphylaxis    Avoid due to myasthenia gravis    Medications:  Prior to Admission medications   Medication Sig Start Date End Date Taking? Authorizing Provider  acetaminophen (TYLENOL) 500 MG tablet Take 1,000 mg by mouth daily as needed for moderate pain or headache.   Yes [provider]  BIOTIN PO Take by mouth.   Yes [provider]  hydrochlorothiazide (HYDRODIURIL) 25 MG tablet Take  1 tablet (25 mg total) by mouth daily. 04/07/16  Yes Kathrynn Ducking, MD  Multiple Vitamins-Minerals (CENTRUM SILVER PO) Take 1 tablet by mouth daily.   Yes [provider]  Multiple Vitamins-Minerals (ICAPS) CAPS Take 1 capsule by mouth 2 (two) times daily.   Yes [provider]  mupirocin ointment (BACTROBAN) 2 % Apply 1 application topically 2 (two) times daily. 06/05/18  Yes Wieters, Hallie C, PA-C  mycophenolate (CELLCEPT) 500 MG tablet TAKE 1 TABLET (500 MG TOTAL) BY MOUTH DAILY. 05/30/19  Yes Kathrynn Ducking, MD  Omega-3 Fatty Acids (FISH OIL ULTRA) 1400 MG CAPS Take 1,400 mg by mouth 2 (two) times daily.   Yes [provider]  pantoprazole (PROTONIX) 40 MG tablet Take 40 mg by mouth daily. May take an additional 40mg s as needed for heartburn 09/27/13  Yes [provider]  Polyethyl Glycol-Propyl Glycol (SYSTANE OP) Apply 1 drop to eye as needed (dry eyes).   Yes [provider]  potassium chloride (K-DUR) 10 MEQ tablet TAKE 1 TABLET BY MOUTH ONCE DAILY 03/16/19  Yes Kathrynn Ducking, MD  pyridostigmine (MESTINON) 60 MG tablet TAKE 1/2 TABLET BY MOUTH 3 TIMES A DAY. 11/12/18  Yes Kathrynn Ducking, MD  tamsulosin (FLOMAX) 0.4 MG CAPS capsule 0.4 mg as needed.  10/24/17  Yes [provider]  UNABLE TO FIND Take 1 tablet by mouth daily. Med Name:EyePromise - zeaxanthin 10mg  and lutein 10mg    Yes [provider]  Vitamin D, Ergocalciferol, (DRISDOL) 50000 UNITS CAPS capsule Take 50,000 Units by mouth every 7 (seven) days. ON FRIDAYS   Yes [provider]  zoledronic acid (RECLAST) 5 MG/100ML SOLN injection Inject 5 mg into the vein once. YEARLY   Yes [provider]    ROS:  Out of a complete 14 system review of symptoms, the patient complains only of the following symptoms, and all other reviewed systems are negative.  Fatigue Diarrhea Indigestion  There were no vitals taken for this visit.  Physical Exam   General: The patient is alert and cooperative at the time of the examination.  Skin: No significant peripheral edema is noted.   Neurologic Exam  Mental status: The patient is alert and oriented x 3 at the time of the examination. The patient has apparent normal recent and remote memory, with an apparently normal attention span and concentration ability.   Cranial nerves: Facial symmetry is present. Speech is normal, no aphasia or dysarthria is noted. Extraocular movements are full. Visual fields are full.  Motor: The patient has good strength in all 4 extremities.  Sensory examination: Soft touch sensation is symmetric on the face,  arms, and legs.  Coordination: The patient has good finger-nose-finger and heel-to-shin bilaterally.  Gait and station: The patient has a normal gait. Tandem gait is unsteady. Romberg is negative, but is unsteady. No drift is seen.  Reflexes: Deep tendon reflexes are symmetric.   Assessment/Plan:  1.  Myasthenia gravis, pharyngeal features  The patient is having some stomach issues on the Mestinon, he is to stop the daily dose of Mestinon and use the medication if needed.  He will remain on CellCept, he is not on prednisone.  He will have blood work today, he will follow-up in 6 months.  He has done well on medical therapy.  Of note, the patient's son has also been diagnosed with myasthenia gravis.  Jill Alexanders MD 06/01/2019 9:37 AM  [facility] [address] [place], [place] [address]  Phone [phone] Fax [phone]

## 2019-06-02 LAB — CBC WITH DIFFERENTIAL/PLATELET
Basophils Absolute: 0 10*3/uL (ref 0.0–0.2)
Basos: 0 %
EOS (ABSOLUTE): 0.1 10*3/uL (ref 0.0–0.4)
Eos: 2 %
Hematocrit: 44.9 % (ref 37.5–51.0)
Hemoglobin: 15.4 g/dL (ref 13.0–17.7)
Immature Grans (Abs): 0 10*3/uL (ref 0.0–0.1)
Immature Granulocytes: 0 %
Lymphocytes Absolute: 1 10*3/uL (ref 0.7–3.1)
Lymphs: 18 %
MCH: 32.8 pg (ref 26.6–33.0)
MCHC: 34.3 g/dL (ref 31.5–35.7)
MCV: 96 fL (ref 79–97)
Monocytes Absolute: 1 10*3/uL — ABNORMAL HIGH (ref 0.1–0.9)
Monocytes: 17 %
Neutrophils Absolute: 3.6 10*3/uL (ref 1.4–7.0)
Neutrophils: 63 %
Platelets: 219 10*3/uL (ref 150–450)
RBC: 4.7 x10E6/uL (ref 4.14–5.80)
RDW: 12 % (ref 11.6–15.4)
WBC: 5.7 10*3/uL (ref 3.4–10.8)

## 2019-06-02 LAB — COMPREHENSIVE METABOLIC PANEL
ALT: 17 IU/L (ref 0–44)
AST: 28 IU/L (ref 0–40)
Albumin/Globulin Ratio: 1.7 (ref 1.2–2.2)
Albumin: 4 g/dL (ref 3.6–4.6)
Alkaline Phosphatase: 50 IU/L (ref 39–117)
BUN/Creatinine Ratio: 10 (ref 10–24)
BUN: 8 mg/dL (ref 8–27)
Bilirubin Total: 1.4 mg/dL — ABNORMAL HIGH (ref 0.0–1.2)
CO2: 27 mmol/L (ref 20–29)
Calcium: 9.3 mg/dL (ref 8.6–10.2)
Chloride: 99 mmol/L (ref 96–106)
Creatinine, Ser: 0.83 mg/dL (ref 0.76–1.27)
GFR calc Af Amer: 91 mL/min/{1.73_m2} (ref >59)
GFR calc non Af Amer: 79 mL/min/{1.73_m2} (ref >59)
Globulin, Total: 2.4 g/dL (ref 1.5–4.5)
Glucose: 98 mg/dL (ref 65–99)
Potassium: 3.5 mmol/L (ref 3.5–5.2)
Sodium: 140 mmol/L (ref 134–144)
Total Protein: 6.4 g/dL (ref 6.0–8.5)

## 2019-06-03 DIAGNOSIS — H353130 Nonexudative age-related macular degeneration, bilateral, stage unspecified: Secondary | ICD-10-CM | POA: Diagnosis not present

## 2019-06-03 DIAGNOSIS — Z961 Presence of intraocular lens: Secondary | ICD-10-CM | POA: Diagnosis not present

## 2019-06-03 DIAGNOSIS — H35341 Macular cyst, hole, or pseudohole, right eye: Secondary | ICD-10-CM | POA: Diagnosis not present

## 2019-06-07 ENCOUNTER — Telehealth: Payer: Self-pay | Admitting: Neurology

## 2019-06-07 DIAGNOSIS — L0390 Cellulitis, unspecified: Secondary | ICD-10-CM | POA: Diagnosis not present

## 2019-06-07 DIAGNOSIS — L570 Actinic keratosis: Secondary | ICD-10-CM | POA: Diagnosis not present

## 2019-06-07 NOTE — Telephone Encounter (Signed)
In general, the antibiotics that should be avoided include aminoglycosides, fluoroquinolones, macrolides.  I called the office of Dr. Denna Haggard and left a message.

## 2019-06-07 NOTE — Telephone Encounter (Signed)
Pt called stating that his Dermatologist is needing for Dr. Jannifer Franklin to call him so that they can discuss what kind of antibiotic he can take. Please call [facility] Dermatologist Dr. Denna Haggard [phone]

## 2019-07-06 ENCOUNTER — Other Ambulatory Visit: Payer: Self-pay | Admitting: Dermatology

## 2019-07-06 DIAGNOSIS — C4442 Squamous cell carcinoma of skin of scalp and neck: Secondary | ICD-10-CM | POA: Diagnosis not present

## 2019-07-06 DIAGNOSIS — D044 Carcinoma in situ of skin of scalp and neck: Secondary | ICD-10-CM | POA: Diagnosis not present

## 2019-07-06 DIAGNOSIS — D0421 Carcinoma in situ of skin of right ear and external auricular canal: Secondary | ICD-10-CM | POA: Diagnosis not present

## 2019-07-06 DIAGNOSIS — L570 Actinic keratosis: Secondary | ICD-10-CM | POA: Diagnosis not present

## 2019-07-28 DIAGNOSIS — C4442 Squamous cell carcinoma of skin of scalp and neck: Secondary | ICD-10-CM | POA: Diagnosis not present

## 2019-07-28 DIAGNOSIS — D044 Carcinoma in situ of skin of scalp and neck: Secondary | ICD-10-CM | POA: Diagnosis not present

## 2019-08-30 ENCOUNTER — Other Ambulatory Visit: Payer: Self-pay | Admitting: Dermatology

## 2019-08-30 DIAGNOSIS — D0439 Carcinoma in situ of skin of other parts of face: Secondary | ICD-10-CM | POA: Diagnosis not present

## 2019-08-31 ENCOUNTER — Telehealth: Payer: Self-pay | Admitting: Neurology

## 2019-08-31 DIAGNOSIS — Z5181 Encounter for therapeutic drug level monitoring: Secondary | ICD-10-CM

## 2019-08-31 NOTE — Telephone Encounter (Signed)
I called the patient.  He is to come by and get blood work done, he is on CellCept.  I will put the orders in.  He asked about whether or not to have the Covid virus vaccination, I indicated that he should get the vaccination.

## 2019-09-01 ENCOUNTER — Other Ambulatory Visit: Payer: Self-pay

## 2019-09-01 ENCOUNTER — Other Ambulatory Visit (INDEPENDENT_AMBULATORY_CARE_PROVIDER_SITE_OTHER): Payer: Medicare Other

## 2019-09-01 DIAGNOSIS — Z5181 Encounter for therapeutic drug level monitoring: Secondary | ICD-10-CM

## 2019-09-01 DIAGNOSIS — Z0289 Encounter for other administrative examinations: Secondary | ICD-10-CM

## 2019-09-02 LAB — COMPREHENSIVE METABOLIC PANEL
ALT: 11 IU/L (ref 0–44)
AST: 21 IU/L (ref 0–40)
Albumin/Globulin Ratio: 1.6 (ref 1.2–2.2)
Albumin: 3.9 g/dL (ref 3.6–4.6)
Alkaline Phosphatase: 51 IU/L (ref 39–117)
BUN/Creatinine Ratio: 9 — ABNORMAL LOW (ref 10–24)
BUN: 8 mg/dL (ref 8–27)
Bilirubin Total: 1.1 mg/dL (ref 0.0–1.2)
CO2: 26 mmol/L (ref 20–29)
Calcium: 8.6 mg/dL (ref 8.6–10.2)
Chloride: 98 mmol/L (ref 96–106)
Creatinine, Ser: 0.88 mg/dL (ref 0.76–1.27)
GFR calc Af Amer: 88 mL/min/{1.73_m2} (ref >59)
GFR calc non Af Amer: 76 mL/min/{1.73_m2} (ref >59)
Globulin, Total: 2.4 g/dL (ref 1.5–4.5)
Glucose: 89 mg/dL (ref 65–99)
Potassium: 3.5 mmol/L (ref 3.5–5.2)
Sodium: 138 mmol/L (ref 134–144)
Total Protein: 6.3 g/dL (ref 6.0–8.5)

## 2019-09-02 LAB — CBC WITH DIFFERENTIAL/PLATELET
Basophils Absolute: 0 10*3/uL (ref 0.0–0.2)
Basos: 0 %
EOS (ABSOLUTE): 0.2 10*3/uL (ref 0.0–0.4)
Eos: 5 %
Hematocrit: 40.2 % (ref 37.5–51.0)
Hemoglobin: 13.9 g/dL (ref 13.0–17.7)
Immature Grans (Abs): 0 10*3/uL (ref 0.0–0.1)
Immature Granulocytes: 0 %
Lymphocytes Absolute: 1.8 10*3/uL (ref 0.7–3.1)
Lymphs: 35 %
MCH: 33 pg (ref 26.6–33.0)
MCHC: 34.6 g/dL (ref 31.5–35.7)
MCV: 96 fL (ref 79–97)
Monocytes Absolute: 0.7 10*3/uL (ref 0.1–0.9)
Monocytes: 13 %
Neutrophils Absolute: 2.4 10*3/uL (ref 1.4–7.0)
Neutrophils: 47 %
Platelets: 217 10*3/uL (ref 150–450)
RBC: 4.21 x10E6/uL (ref 4.14–5.80)
RDW: 12.3 % (ref 11.6–15.4)
WBC: 5.1 10*3/uL (ref 3.4–10.8)

## 2019-10-10 ENCOUNTER — Ambulatory Visit: Payer: Medicare Other | Attending: Internal Medicine

## 2019-10-10 DIAGNOSIS — Z23 Encounter for immunization: Secondary | ICD-10-CM | POA: Insufficient documentation

## 2019-10-10 NOTE — Progress Notes (Signed)
   Covid-19 Vaccination Clinic  Name:  [name]    MRN: [id number] DOB: 12-07-1927  10/10/2019  [name] was observed post Covid-19 immunization for 15 minutes without incidence. He was provided with Vaccine Information Sheet and instruction to access the V-Safe system.   [name] was instructed to call 911 with any severe reactions post vaccine: Marland Kitchen Difficulty breathing  . Swelling of your face and throat  . A fast heartbeat  . A bad rash all over your body  . Dizziness and weakness    Immunizations Administered    Name Date Dose VIS Date Route   Pfizer COVID-19 Vaccine 10/10/2019  8:10 AM 0.3 mL 08/26/2019 Intramuscular   Manufacturer: [place]   Lot: [id number]   [place]: [id number]

## 2019-10-19 DIAGNOSIS — Z125 Encounter for screening for malignant neoplasm of prostate: Secondary | ICD-10-CM | POA: Diagnosis not present

## 2019-10-19 DIAGNOSIS — I10 Essential (primary) hypertension: Secondary | ICD-10-CM | POA: Diagnosis not present

## 2019-10-19 DIAGNOSIS — Z0000 Encounter for general adult medical examination without abnormal findings: Secondary | ICD-10-CM | POA: Diagnosis not present

## 2019-10-19 DIAGNOSIS — E538 Deficiency of other specified B group vitamins: Secondary | ICD-10-CM | POA: Diagnosis not present

## 2019-10-19 DIAGNOSIS — M810 Age-related osteoporosis without current pathological fracture: Secondary | ICD-10-CM | POA: Diagnosis not present

## 2019-10-25 DIAGNOSIS — I10 Essential (primary) hypertension: Secondary | ICD-10-CM | POA: Diagnosis not present

## 2019-10-25 DIAGNOSIS — R82998 Other abnormal findings in urine: Secondary | ICD-10-CM | POA: Diagnosis not present

## 2019-10-26 DIAGNOSIS — M810 Age-related osteoporosis without current pathological fracture: Secondary | ICD-10-CM | POA: Diagnosis not present

## 2019-10-26 DIAGNOSIS — Z0000 Encounter for general adult medical examination without abnormal findings: Secondary | ICD-10-CM | POA: Diagnosis not present

## 2019-10-26 DIAGNOSIS — I10 Essential (primary) hypertension: Secondary | ICD-10-CM | POA: Diagnosis not present

## 2019-10-26 DIAGNOSIS — I2510 Atherosclerotic heart disease of native coronary artery without angina pectoris: Secondary | ICD-10-CM | POA: Diagnosis not present

## 2019-10-31 ENCOUNTER — Ambulatory Visit: Payer: Medicare Other | Attending: Internal Medicine

## 2019-10-31 DIAGNOSIS — Z23 Encounter for immunization: Secondary | ICD-10-CM | POA: Insufficient documentation

## 2019-10-31 NOTE — Progress Notes (Signed)
   Covid-19 Vaccination Clinic  Name:  Victor Sutton    MRN: [id number] DOB: 21-Dec-1927  10/31/2019  [name] was observed post Covid-19 immunization for 30 minutes based on pre-vaccination screening without incidence. He was provided with Vaccine Information Sheet and instruction to access the V-Safe system.   [name] was instructed to call 911 with any severe reactions post vaccine: Marland Kitchen Difficulty breathing  . Swelling of your face and throat  . A fast heartbeat  . A bad rash all over your body  . Dizziness and weakness    Immunizations Administered    Name Date Dose VIS Date Route   Pfizer COVID-19 Vaccine 10/31/2019  8:21 AM 0.3 mL 08/26/2019 Intramuscular   Manufacturer: [place]   Lot: [id number]   [place]: [id number]

## 2019-11-09 ENCOUNTER — Other Ambulatory Visit: Payer: Self-pay | Admitting: Dermatology

## 2019-11-09 DIAGNOSIS — D485 Neoplasm of uncertain behavior of skin: Secondary | ICD-10-CM | POA: Diagnosis not present

## 2019-11-09 DIAGNOSIS — L820 Inflamed seborrheic keratosis: Secondary | ICD-10-CM | POA: Diagnosis not present

## 2019-11-23 ENCOUNTER — Other Ambulatory Visit: Payer: Self-pay | Admitting: Neurology

## 2019-11-29 ENCOUNTER — Ambulatory Visit: Payer: Medicare Other | Admitting: Neurology

## 2019-11-29 ENCOUNTER — Other Ambulatory Visit: Payer: Self-pay

## 2019-11-29 ENCOUNTER — Encounter: Payer: Self-pay | Admitting: Neurology

## 2019-11-29 VITALS — BP 132/79 | HR 77 | Temp 98.4°F | Ht 69.0 in | Wt 170.5 lb

## 2019-11-29 DIAGNOSIS — G7000 Myasthenia gravis without (acute) exacerbation: Secondary | ICD-10-CM | POA: Diagnosis not present

## 2019-11-29 DIAGNOSIS — R269 Unspecified abnormalities of gait and mobility: Secondary | ICD-10-CM | POA: Insufficient documentation

## 2019-11-29 DIAGNOSIS — R5382 Chronic fatigue, unspecified: Secondary | ICD-10-CM

## 2019-11-29 DIAGNOSIS — Z5181 Encounter for therapeutic drug level monitoring: Secondary | ICD-10-CM

## 2019-11-29 DIAGNOSIS — E538 Deficiency of other specified B group vitamins: Secondary | ICD-10-CM

## 2019-11-29 HISTORY — DX: Unspecified abnormalities of gait and mobility: R26.9

## 2019-11-29 NOTE — Progress Notes (Signed)
Reason for visit: Myasthenia gravis  Victor Sutton is an 86 y.o. male  History of present illness:  Victor Sutton is an 85 year old right-handed white male with a history of myasthenia gravis with pharyngeal onset.  The patient has done well on low-dose CellCept, he is taking the Mestinon only if needed.  He was having stomach upset and diarrhea on the medication.  The patient has not had any significant issues with swallowing or speech changes.  He denies any double vision or ptosis.  He has had a chronic issue with some mild gait instability that he claims has been present since 2002.  His wife is noted that he tends to weave a little bit when he walks down the hall.  He has not had any falls.  He reports no numbness in the feet.  The patient goes on to say that before he moved here from [place] he was on B12 shots but he has not continued B12 supplementation.  He returns for an evaluation.  Past Medical History:  Diagnosis Date  . Adenomatous colon polyp   . B12 deficiency    HIstory  . BPH (benign prostatic hyperplasia)   . Cameron ulcer   . Cancer ([place])    precancerous skin lesions on leg   . Cataract 2009   bilateral cataract surgery  . Delayed gastric emptying   . Disturbance of salivary secretion 06/20/2013  . Dysphagia, pharyngoesophageal phase 03/16/2013  . Dysphonia 03/16/2013  . GERD (gastroesophageal reflux disease)   . Hiatal hernia    hiatial hernia repair  02/08/13  . History of blood transfusion   . Hyperlipidemia    not on medication  . Hypertension   . Iron deficiency anemia   . Leukoplakia 2001   Treated for  . Macular degeneration    (L) wet, (R) dry  . Myasthenia gravis ([place]) 03/21/2013  . Peripheral neuropathy    Mild History of  . Polyneuritis 1965   Resolved  . Ulcer   . Vitamin D deficiency     Past Surgical History:  Procedure Laterality Date  . Arthroscopic knee surgery     left  . cateract surgeries      bilateral  . CHOLECYSTECTOMY      . COLONOSCOPY WITH PROPOFOL N/A 02/16/2017   Procedure: COLONOSCOPY WITH PROPOFOL;  Surgeon: Garlan Fair, MD;  [facility];  Service: Endoscopy;  Laterality: N/A;  . GASTROSTOMY W/ FEEDING TUBE  [id number]  . HERNIA REPAIR     Laparoscopic ventral  . HIATAL HERNIA REPAIR  2004  . HIATAL HERNIA REPAIR N/A 02/08/2013   Procedure:  REPAIR OFCURRENT HIATAL HERNIA, ;  Surgeon: Odis Hollingshead, MD;  [facility];  Service: General;  Laterality: N/A;  . LAPAROSCOPIC LYSIS OF ADHESIONS N/A 02/08/2013   Procedure: LAPAROSCOPIC LYSIS OF ADHESIONS;  Surgeon: Odis Hollingshead, MD;  [facility];  Service: General;  Laterality: N/A;  . LAPAROSCOPIC NISSEN FUNDOPLICATION  99991111   Procedure: LAPAROSCOPIC NISSEN FUNDOPLICATION;  Surgeon: Odis Hollingshead, MD;  [facility];  Service: General;;  . Multiple oral surgeries  2002  . SKIN CANCER EXCISION  10/2012   right leg-shin area  . STOMACH SURGERY  2005   states his stomach had moved up toward his chest , some type of surgery performed  . TONSILLECTOMY    . UPPER GI ENDOSCOPY N/A 02/08/2013   Procedure: UPPER GI ENDOSCOPY;  Surgeon: Odis Hollingshead, MD;  Location: [facility];  Service: General;  Laterality: N/A;    Family History  Problem Relation Age of Onset  . Breast cancer Sister   . CAD Sister   . COPD Sister   . Diabetes Father   . Heart attack Father   . Colon cancer Son 25  . Prostate cancer Paternal Grandfather   . Colon cancer Son 39    Social history:  reports that he quit smoking about 43 years ago. He has never used smokeless tobacco. He reports that he does not drink alcohol or use drugs.    Allergies  Allergen Reactions  . Ciprofloxacin Anaphylaxis    Avoid due to myasthenia gravis  . Quinolones Anaphylaxis    Avoid due to myasthenia gravis    Medications:  Prior to Admission medications   Medication Sig Start Date End Date Taking? Authorizing Provider  acetaminophen (TYLENOL) 500 MG  tablet Take 1,000 mg by mouth daily as needed for moderate pain or headache.   Yes [provider]  BIOTIN PO Take by mouth.   Yes [provider]  hydrochlorothiazide (HYDRODIURIL) 25 MG tablet Take 1 tablet (25 mg total) by mouth daily. 04/07/16  Yes Kathrynn Ducking, MD  Multiple Vitamins-Minerals (CENTRUM SILVER PO) Take 1 tablet by mouth daily.   Yes [provider]  Multiple Vitamins-Minerals (ICAPS) CAPS Take 1 capsule by mouth 2 (two) times daily.   Yes [provider]  mupirocin ointment (BACTROBAN) 2 % Apply 1 application topically 2 (two) times daily. 06/05/18  Yes Wieters, Hallie C, PA-C  mycophenolate (CELLCEPT) 500 MG tablet TAKE 1 TABLET BY MOUTH DAILY. 11/23/19  Yes Kathrynn Ducking, MD  pantoprazole (PROTONIX) 40 MG tablet Take 40 mg by mouth daily. May take an additional 40mg s as needed for heartburn 09/27/13  Yes [provider]  Polyethyl Glycol-Propyl Glycol (SYSTANE OP) Apply 1 drop to eye as needed (dry eyes).   Yes [provider]  potassium chloride (K-DUR) 10 MEQ tablet TAKE 1 TABLET BY MOUTH ONCE DAILY 03/16/19  Yes Kathrynn Ducking, MD  pyridostigmine (MESTINON) 60 MG tablet TAKE 1/2 TABLET BY MOUTH 3 TIMES A DAY. 11/12/18  Yes Kathrynn Ducking, MD  tamsulosin (FLOMAX) 0.4 MG CAPS capsule 0.4 mg as needed.  10/24/17  Yes [provider]  UNABLE TO FIND Take 1 tablet by mouth daily. Med Name:EyePromise - zeaxanthin 10mg  and lutein 10mg    Yes [provider]  Vitamin D, Ergocalciferol, (DRISDOL) 50000 UNITS CAPS capsule Take 50,000 Units by mouth every 7 (seven) days. ON FRIDAYS   Yes [provider]  zoledronic acid (RECLAST) 5 MG/100ML SOLN injection Inject 5 mg into the vein once. YEARLY   Yes [provider]  Omega-3 Fatty Acids (FISH OIL ULTRA) 1400 MG CAPS Take 1,400 mg by mouth 2 (two) times daily.    [provider]    ROS:  Out of a complete 14 system review of  symptoms, the patient complains only of the following symptoms, and all other reviewed systems are negative.  Walking difficulty  Blood pressure 132/79, pulse 77, temperature 98.4 F (36.9 C), height 5\' 9"  (1.753 m), weight 170 lb 8 oz (77.3 kg).  Physical Exam  General: The patient is alert and cooperative at the time of the examination.  Skin: 1-2+ edema is noted below the knees bilaterally.   Neurologic Exam  Mental status: The patient is alert and oriented x 3 at the time of the examination. The patient has apparent normal  recent and remote memory, with an apparently normal attention span and concentration ability.   Cranial nerves: Facial symmetry is present. Speech is normal, no aphasia or dysarthria is noted. Extraocular movements are full. Visual fields are full.  Good strength of the facial muscles and the muscles with jaw opening and closure are noted.  Motor: The patient has good strength in all 4 extremities.  Sensory examination: Soft touch sensation is symmetric on the face, arms, and legs.  Coordination: The patient has good finger-nose-finger and heel-to-shin bilaterally.  Gait and station: The patient has a slightly wide-based gait, he does tend to weep slightly when he walks down the hall.  Tandem gait is slightly unsteady.  Romberg is negative but is slightly unsteady.  Reflexes: Deep tendon reflexes are symmetric.   Assessment/Plan:  1.  Myasthenia gravis  2.  Mild gait instability  The patient is doing well with his myasthenia, we will check blood work today as he is on CellCept, we will also recheck a B12 level.  The patient may desire in the future to go through some physical therapy for gait training, they will call me about this if desired.  The patient will follow up in 6 months.  Jill Alexanders MD 11/29/2019 7:53 AM  [facility] [address] [place], [place] [address]  Phone [phone] Fax [phone]

## 2019-12-01 LAB — CBC WITH DIFFERENTIAL/PLATELET
Basophils Absolute: 0 10*3/uL (ref 0.0–0.2)
Basos: 1 %
EOS (ABSOLUTE): 0.3 10*3/uL (ref 0.0–0.4)
Eos: 6 %
Hematocrit: 44.8 % (ref 37.5–51.0)
Hemoglobin: 15.1 g/dL (ref 13.0–17.7)
Immature Grans (Abs): 0 10*3/uL (ref 0.0–0.1)
Immature Granulocytes: 0 %
Lymphocytes Absolute: 1.3 10*3/uL (ref 0.7–3.1)
Lymphs: 28 %
MCH: 32.8 pg (ref 26.6–33.0)
MCHC: 33.7 g/dL (ref 31.5–35.7)
MCV: 97 fL (ref 79–97)
Monocytes Absolute: 0.7 10*3/uL (ref 0.1–0.9)
Monocytes: 14 %
Neutrophils Absolute: 2.5 10*3/uL (ref 1.4–7.0)
Neutrophils: 51 %
Platelets: 241 10*3/uL (ref 150–450)
RBC: 4.6 x10E6/uL (ref 4.14–5.80)
RDW: 11.9 % (ref 11.6–15.4)
WBC: 4.8 10*3/uL (ref 3.4–10.8)

## 2019-12-01 LAB — COMPREHENSIVE METABOLIC PANEL
ALT: 12 IU/L (ref 0–44)
AST: 26 IU/L (ref 0–40)
Albumin/Globulin Ratio: 1.5 (ref 1.2–2.2)
Albumin: 4.1 g/dL (ref 3.6–4.6)
Alkaline Phosphatase: 51 IU/L (ref 39–117)
BUN/Creatinine Ratio: 14 (ref 10–24)
BUN: 13 mg/dL (ref 8–27)
Bilirubin Total: 1.1 mg/dL (ref 0.0–1.2)
CO2: 24 mmol/L (ref 20–29)
Calcium: 9.1 mg/dL (ref 8.6–10.2)
Chloride: 97 mmol/L (ref 96–106)
Creatinine, Ser: 0.9 mg/dL (ref 0.76–1.27)
GFR calc Af Amer: 87 mL/min/{1.73_m2} (ref >59)
GFR calc non Af Amer: 75 mL/min/{1.73_m2} (ref >59)
Globulin, Total: 2.8 g/dL (ref 1.5–4.5)
Glucose: 88 mg/dL (ref 65–99)
Potassium: 3.6 mmol/L (ref 3.5–5.2)
Sodium: 136 mmol/L (ref 134–144)
Total Protein: 6.9 g/dL (ref 6.0–8.5)

## 2019-12-01 LAB — TSH: TSH: 2.88 u[IU]/mL (ref 0.450–4.500)

## 2019-12-01 LAB — COPPER, SERUM: Copper: 107 ug/dL (ref 69–132)

## 2019-12-01 LAB — VITAMIN B12: Vitamin B-12: >2000 pg/mL — ABNORMAL HIGH (ref 232–1245)

## 2019-12-13 ENCOUNTER — Ambulatory Visit: Payer: Medicare Other | Admitting: Dermatology

## 2019-12-13 ENCOUNTER — Other Ambulatory Visit: Payer: Self-pay

## 2019-12-13 ENCOUNTER — Encounter: Payer: Self-pay | Admitting: Dermatology

## 2019-12-13 DIAGNOSIS — C44329 Squamous cell carcinoma of skin of other parts of face: Secondary | ICD-10-CM

## 2019-12-13 DIAGNOSIS — D492 Neoplasm of unspecified behavior of bone, soft tissue, and skin: Secondary | ICD-10-CM

## 2019-12-13 NOTE — Patient Instructions (Signed)
Levulan/PDT Treatment Common Side Effects ° °- Burning/stinging, which may be severe and last up to 24-72 hours after your treatment ° °- Redness, swelling and/or peeling which may last up to 4 weeks ° °- Scaling/crusting which may last up to 4-6 weeks ° °- Sun sensitivity ° °Care Instructions ° °- Okay to wash with soap and water and shampoo as normal ° °- If needed, you can do a cold compress (ex. Ice packs) for comfort ° °- If okay with your Primary Doctor, you may use analgesics such as Tylenol every 4-6 hours, not to exceed recommended dose ° °- You may apply Cerave Healing Ointment, Vaseline or Aquaphor ° °- If you have a lot of swelling you may take a Benadryl to help with this ° °Sun Precautions ° °- Avoid all sun exposure for 2 days ° °- Wear a wide brim hat for the next week if outside ° °- Wear a sunblock with titanium dioxide greater than 5% daily ° ° °We will recheck you in 10-12 weeks. If any problems, please call the office and ask to speak with a nurse. °

## 2019-12-15 ENCOUNTER — Telehealth: Payer: Self-pay | Admitting: *Deleted

## 2019-12-15 NOTE — Telephone Encounter (Signed)
Pathology to patient. Patient didn't want to make 3-6 month follow up visit at this time, says he will call back to schedule.

## 2019-12-15 NOTE — Telephone Encounter (Signed)
-----   Message from Lavonna Monarch, MD sent at 12/15/2019  6:33 AM EDT ----- Non-melanoma cancer treated at time of biopsy; routine check 3-6 months.

## 2019-12-16 NOTE — Progress Notes (Addendum)
   Follow-Up Visit   Subjective  Victor Sutton is a 86 y.o. male who presents for the following: Skin Problem (right forehead new spot ).  New growth Location: Right forehead Duration: 1 to 2 months Quality: Larger Associated Signs/Symptoms: Sore Modifying Factors:  Severity:  Timing: Context: History of skin cancers  The following portions of the chart were reviewed this encounter and updated as appropriate: Tobacco  Allergies  Problems  Med Hx  Surg Hx  Fam Hx      Objective  Well appearing patient in no apparent distress; mood and affect are within normal limits. 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 A focused examination was performed including face. Relevant physical exam findings are noted in the Assessment and Plan. After shave biopsy obvious deep keratin seen, curet x3 and ED base.  Assessment & Plan  Neoplasm of skin Right Forehead  Skin / nail biopsy Type of biopsy: tangential   Informed consent: discussed and consent obtained   Timeout: patient name, date of birth, surgical site, and procedure verified   Procedure prep:  Patient was prepped and draped in usual sterile fashion Prep type:  Chlorhexidine Anesthesia: the lesion was anesthetized in a standard fashion   Anesthetic:  1% lidocaine w/ epinephrine 1-100,000 local infiltration Instrument used: flexible razor blade   Hemostasis achieved with: pressure   Outcome: patient tolerated procedure well   Post-procedure details: sterile dressing applied and wound care instructions given   Dressing type: bandage and petrolatum    Destruction of lesion  Destruction method: electrodesiccation and curettage   Informed consent: discussed and consent obtained   Timeout:  patient name, date of birth, surgical site, and procedure verified Anesthesia: the lesion was anesthetized in a standard fashion   Anesthetic:  1% lidocaine w/ epinephrine 1-100,000 local infiltration Curettage performed in three different  directions: Yes   Electrodesiccation performed over the curetted area: Yes   Curettage cycles:  3 Lesion length (cm):  1.3 Lesion width (cm):  1.3 Margin per side (cm):  0 Final wound size (cm):  1.3 Hemostasis achieved with:  pressure and ferric subsulfate Outcome: patient tolerated procedure well with no complications   Post-procedure details: wound care instructions given    Specimen 1 - Surgical pathology Differential Diagnosis: bcc vs scc Check Margins: No

## 2019-12-21 ENCOUNTER — Ambulatory Visit: Payer: Medicare Other | Admitting: Dermatology

## 2020-01-12 DIAGNOSIS — Z0120 Encounter for dental examination and cleaning without abnormal findings: Secondary | ICD-10-CM | POA: Diagnosis not present

## 2020-01-30 NOTE — Addendum Note (Signed)
Addended by: Ashok Cordia B on: 01/30/2020 11:49 AM   Modules accepted: Orders

## 2020-04-02 ENCOUNTER — Other Ambulatory Visit: Payer: Self-pay | Admitting: Neurology

## 2020-05-01 DIAGNOSIS — H524 Presbyopia: Secondary | ICD-10-CM | POA: Diagnosis not present

## 2020-05-15 ENCOUNTER — Ambulatory Visit: Payer: Medicare Other | Attending: Internal Medicine

## 2020-05-15 DIAGNOSIS — Z23 Encounter for immunization: Secondary | ICD-10-CM

## 2020-05-15 NOTE — Progress Notes (Signed)
   Covid-19 Vaccination Clinic  Name:  Victor Sutton    MRN: [id number] DOB: 08-29-1928  05/15/2020  Victor Sutton was observed post Covid-19 immunization for 30 minutes based on pre-vaccination screening without incident. He was provided with Vaccine Information Sheet and instruction to access the V-Safe system.   Victor Sutton was instructed to call 911 with any severe reactions post vaccine: Marland Kitchen Difficulty breathing  . Swelling of face and throat  . A fast heartbeat  . A bad rash all over body  . Dizziness and weakness

## 2020-05-24 ENCOUNTER — Other Ambulatory Visit: Payer: Self-pay | Admitting: Neurology

## 2020-05-31 NOTE — Progress Notes (Signed)
PATIENT: Victor Sutton DOB: 15-Apr-1928  REASON FOR VISIT: follow up HISTORY FROM: patient  HISTORY OF PRESENT ILLNESS: Today 06/04/20 [name] is a 86 year old male with history of myasthenia gravis with pharyngeal onset.  He is on low-dose CellCept and takes Mestinon as needed.  He rarely takes Mestinon, only if fullness sensation after meal, has not taken in several months.  Denies double vision or ptosis-he pays attention to this daily.  No trouble swallowing.  He is not very active, enjoys reading. He has noted weaving with walking for several years, has been stable.  He has declining vision, he can drive, but his wife does most driving. Looking forward to cooler weather to get outside walking.  Presents today for evaluation unaccompanied.  HISTORY 11/29/2019 Dr. Jannifer Franklin: [name] is an 86 year old right-handed white male with a history of myasthenia gravis with pharyngeal onset.  The patient has done well on low-dose CellCept, he is taking the Mestinon only if needed.  He was having stomach upset and diarrhea on the medication.  The patient has not had any significant issues with swallowing or speech changes.  He denies any double vision or ptosis.  He has had a chronic issue with some mild gait instability that he claims has been present since 2002.  His wife is noted that he tends to weave a little bit when he walks down the hall.  He has not had any falls.  He reports no numbness in the feet.  The patient goes on to say that before he moved here from [place] he was on B12 shots but he has not continued B12 supplementation.  He returns for an evaluation.   REVIEW OF SYSTEMS: Out of a complete 14 system review of symptoms, the patient complains only of the following symptoms, and all other reviewed systems are negative.  N/A  ALLERGIES: Allergies  Allergen Reactions   Ciprofloxacin Anaphylaxis    Avoid due to myasthenia gravis   Quinolones Anaphylaxis    Avoid due to  myasthenia gravis    HOME MEDICATIONS: Outpatient Medications Prior to Visit  Medication Sig Dispense Refill   hydrochlorothiazide (HYDRODIURIL) 25 MG tablet Take 1 tablet (25 mg total) by mouth daily. 90 tablet 1   Multiple Vitamins-Minerals (ICAPS) CAPS Take 1 capsule by mouth 2 (two) times daily.     mupirocin ointment (BACTROBAN) 2 % Apply 1 application topically 2 (two) times daily. 22 g 0   pantoprazole (PROTONIX) 40 MG tablet Take 40 mg by mouth daily. May take an additional 40mg s as needed for heartburn     potassium chloride (KLOR-CON) 10 MEQ tablet TAKE 1 TABLET BY MOUTH ONCE DAILY 90 tablet 3   tamsulosin (FLOMAX) 0.4 MG CAPS capsule 0.4 mg as needed.   3   UNABLE TO FIND Take 1 tablet by mouth daily. Med Name:[employer] - zeaxanthin 10mg  and lutein 10mg      Vitamin D, Ergocalciferol, (DRISDOL) 50000 UNITS CAPS capsule Take 50,000 Units by mouth every 7 (seven) days. ON FRIDAYS     zoledronic acid (RECLAST) 5 MG/100ML SOLN injection Inject 5 mg into the vein once. YEARLY     mycophenolate (CELLCEPT) 500 MG tablet TAKE 1 TABLET BY MOUTH DAILY. 90 tablet 0   pyridostigmine (MESTINON) 60 MG tablet TAKE 1/2 TABLET BY MOUTH 3 TIMES A DAY. 150 tablet 3   No facility-administered medications prior to visit.    PAST MEDICAL HISTORY: Past Medical History:  Diagnosis Date   Adenomatous colon polyp  B12 deficiency    HIstory   BPH (benign prostatic hyperplasia)    Victor Sutton ulcer    Cancer [facility])    precancerous skin lesions on leg    Cataract 2009   bilateral cataract surgery   Delayed gastric emptying    Disturbance of salivary secretion 06/20/2013   Dysphagia, pharyngoesophageal phase 03/16/2013   Dysphonia 03/16/2013   Gait abnormality 11/29/2019   GERD (gastroesophageal reflux disease)    Hiatal hernia    hiatial hernia repair  02/08/13   History of blood transfusion    Hyperlipidemia    not on medication   Hypertension    Iron deficiency anemia     Leukoplakia 2001   Treated for   Macular degeneration    (L) wet, (R) dry   Myasthenia gravis ([place]) 03/21/2013   Peripheral neuropathy    Mild History of   Polyneuritis 1965   Resolved   Ulcer    Vitamin D deficiency     PAST SURGICAL HISTORY: Past Surgical History:  Procedure Laterality Date   Arthroscopic knee surgery     left   cateract surgeries      bilateral   CHOLECYSTECTOMY     COLONOSCOPY WITH PROPOFOL N/A 02/16/2017   Procedure: COLONOSCOPY WITH PROPOFOL;  Surgeon: Garlan Fair, MD;  Location: [facility];  Service: Endoscopy;  Laterality: N/A;   GASTROSTOMY W/ FEEDING TUBE  [id number]   HERNIA REPAIR     Laparoscopic ventral   HIATAL HERNIA REPAIR  2004   HIATAL HERNIA REPAIR N/A 02/08/2013   Procedure:  REPAIR OFCURRENT HIATAL HERNIA, ;  Surgeon: Odis Hollingshead, MD;  Location: [facility];  Service: General;  Laterality: N/A;   LAPAROSCOPIC LYSIS OF ADHESIONS N/A 02/08/2013   Procedure: LAPAROSCOPIC LYSIS OF ADHESIONS;  Surgeon: Odis Hollingshead, MD;  Location: [facility];  Service: General;  Laterality: N/A;   LAPAROSCOPIC NISSEN FUNDOPLICATION  0/63/0160   Procedure: LAPAROSCOPIC NISSEN FUNDOPLICATION;  Surgeon: Odis Hollingshead, MD;  Location: [facility];  Service: General;;   Multiple oral surgeries  2002   SKIN CANCER EXCISION  10/2012   right leg-shin area   STOMACH SURGERY  2005   states his stomach had moved up toward his chest , some type of surgery performed   TONSILLECTOMY     UPPER GI ENDOSCOPY N/A 02/08/2013   Procedure: UPPER GI ENDOSCOPY;  Surgeon: Odis Hollingshead, MD;  Location: [facility];  Service: General;  Laterality: N/A;    FAMILY HISTORY: Family History  Problem Relation Age of Onset   Breast cancer Sister    CAD Sister    COPD Sister    Diabetes Father    Heart attack Father    Colon cancer Son 71   Prostate cancer Paternal Grandfather    Colon cancer Son 14    SOCIAL HISTORY: Social History    Socioeconomic History   Marital status: Married    Spouse name: [name]   Number of children: 3   Years of education: 16   Highest education level: Not on file  Occupational History   Occupation: Retired    Fish farm manager: RETIRED  Tobacco Use   Smoking status: Former Smoker    Quit date: 09/15/1976    Years since quitting: 43.7   Smokeless tobacco: Never Used   Tobacco comment: Quit 1980  Vaping Use   Vaping Use: Never used  Substance and Sexual Activity   Alcohol use: No    Comment: Moderate alcohol, wine   Drug  use: No   Sexual activity: Not on file  Other Topics Concern   Not on file  Social History Narrative   Lives at home, married   Patient is right-handed.   Patient drinks caffeine occasionally.      Social Determinants of Health   Financial Resource Strain:    Difficulty of Paying Living Expenses: Not on file  Food Insecurity:    Worried About Charity fundraiser in the Last Year: Not on file   [employer] of Food in the Last Year: Not on file  Transportation Needs:    Lack of Transportation (Medical): Not on file   Lack of Transportation (Non-Medical): Not on file  Physical Activity:    Days of Exercise per Week: Not on file   Minutes of Exercise per Session: Not on file  Stress:    Feeling of Stress : Not on file  Social Connections:    Frequency of Communication with Friends and Family: Not on file   Frequency of Social Gatherings with Friends and Family: Not on file   Attends Religious Services: Not on file   Active Member of Clubs or Organizations: Not on file   Attends Archivist Meetings: Not on file   Marital Status: Not on file  Intimate Partner Violence:    Fear of Current or Ex-Partner: Not on file   Emotionally Abused: Not on file   Physically Abused: Not on file   Sexually Abused: Not on file   PHYSICAL EXAM  Vitals:   06/04/20 0820  BP: 130/72  Pulse: 89  Weight: 170 lb (77.1 kg)  Height: 5\' 9"   (1.753 m)   Body mass index is 25.1 kg/m.  Generalized: Well developed, in no acute distress   Neurological examination  Mentation: Alert oriented to time, place, history taking. Follows all commands speech and language fluent Cranial nerve II-XII: Pupils were equal round reactive to light. Extraocular movements were full, visual field were full on confrontational test. Facial sensation and strength were normal.  Head turning and shoulder shrug were normal and symmetric.  No cheek puff or eye closure weakness noted. Motor: The motor testing reveals 5 over 5 strength of all 4 extremities. Good symmetric motor tone is noted throughout.  Sensory: Sensory testing is intact to soft touch on all 4 extremities. No evidence of extinction is noted.  Coordination: Cerebellar testing reveals good finger-nose-finger and heel-to-shin bilaterally.  Gait and station: Gait is slightly wide-based, does have slight weaving with walking Reflexes: Deep tendon reflexes are symmetric and normal bilaterally.   DIAGNOSTIC DATA (LABS, IMAGING, TESTING) - I reviewed patient records, labs, notes, testing and imaging myself where available.  Lab Results  Component Value Date   WBC 4.8 11/29/2019   HGB 15.1 11/29/2019   HCT 44.8 11/29/2019   MCV 97 11/29/2019   PLT 241 11/29/2019      Component Value Date/Time   NA 136 11/29/2019 0819   K 3.6 11/29/2019 0819   CL 97 11/29/2019 0819   CO2 24 11/29/2019 0819   GLUCOSE 88 11/29/2019 0819   GLUCOSE 105 (H) 02/11/2014 1131   BUN 13 11/29/2019 0819   CREATININE 0.90 11/29/2019 0819   CALCIUM 9.1 11/29/2019 0819   PROT 6.9 11/29/2019 0819   ALBUMIN 4.1 11/29/2019 0819   AST 26 11/29/2019 0819   ALT 12 11/29/2019 0819   ALKPHOS 51 11/29/2019 0819   BILITOT 1.1 11/29/2019 0819   GFRNONAA 75 11/29/2019 0819   GFRAA 87 11/29/2019  0819   Lab Results  Component Value Date   CHOL 118 02/14/2013   TRIG 79 02/21/2013   No results found for: HGBA1C Lab  Results  Component Value Date   VITAMINB12 >2000 (H) 11/29/2019   Lab Results  Component Value Date   TSH 2.880 11/29/2019      ASSESSMENT AND PLAN 86 y.o. year old male  has a past medical history of Adenomatous colon polyp, B12 deficiency, BPH (benign prostatic hyperplasia), Victor Sutton ulcer, Cancer ([place]), Cataract (2009), Delayed gastric emptying, Disturbance of salivary secretion (06/20/2013), Dysphagia, pharyngoesophageal phase (03/16/2013), Dysphonia (03/16/2013), Gait abnormality (11/29/2019), GERD (gastroesophageal reflux disease), Hiatal hernia, History of blood transfusion, Hyperlipidemia, Hypertension, Iron deficiency anemia, Leukoplakia (2001), Macular degeneration, Myasthenia gravis ([place]) (03/21/2013), Peripheral neuropathy, Polyneuritis (1965), Ulcer, and Vitamin D deficiency. here with:  1.  Myasthenia gravis 2.  Mild gait instability  -Remains overall stable -Continue CellCept 500 mg daily -Continue Mestinon PRN -Check routine blood work today -Wants to hold off on physical therapy, will get back to more walking with cooler temperatures -Follow-up in 6 months or sooner if needed  I spent 30 minutes of face-to-face and non-face-to-face time with patient.  This included previsit chart review, lab review, study review, order entry, electronic health record documentation, patient education.  Butler Denmark, AGNP-C, DNP 06/04/2020, 9:09 AM [facility] [address], [place], [place] [address] [phone]

## 2020-06-04 ENCOUNTER — Ambulatory Visit: Payer: Medicare Other | Admitting: Neurology

## 2020-06-04 ENCOUNTER — Encounter: Payer: Self-pay | Admitting: Neurology

## 2020-06-04 VITALS — BP 130/72 | HR 89 | Ht 69.0 in | Wt 170.0 lb

## 2020-06-04 DIAGNOSIS — G7000 Myasthenia gravis without (acute) exacerbation: Secondary | ICD-10-CM

## 2020-06-04 MED ORDER — MYCOPHENOLATE MOFETIL 500 MG PO TABS: 500.0000 mg | ORAL_TABLET | Freq: Every day | ORAL | 3 refills | Status: DC

## 2020-06-04 NOTE — Patient Instructions (Addendum)
Great to meet you today! Continue current medications Check blood work today  See you back in 6 months Happy early 89th birthday!

## 2020-06-05 LAB — COMPREHENSIVE METABOLIC PANEL WITH GFR
ALT: 15 [IU]/L (ref 0–44)
AST: 25 [IU]/L (ref 0–40)
Albumin/Globulin Ratio: 1.5 (ref 1.2–2.2)
Albumin: 3.9 g/dL (ref 3.6–4.6)
Alkaline Phosphatase: 49 [IU]/L (ref 44–121)
BUN/Creatinine Ratio: 13 (ref 10–24)
BUN: 11 mg/dL (ref 8–27)
Bilirubin Total: 1 mg/dL (ref 0.0–1.2)
CO2: 29 mmol/L (ref 20–29)
Calcium: 9.4 mg/dL (ref 8.6–10.2)
Chloride: 97 mmol/L (ref 96–106)
Creatinine, Ser: 0.84 mg/dL (ref 0.76–1.27)
GFR calc Af Amer: 90 mL/min/{1.73_m2}
GFR calc non Af Amer: 78 mL/min/{1.73_m2}
Globulin, Total: 2.6 g/dL (ref 1.5–4.5)
Glucose: 90 mg/dL (ref 65–99)
Potassium: 3.6 mmol/L (ref 3.5–5.2)
Sodium: 137 mmol/L (ref 134–144)
Total Protein: 6.5 g/dL (ref 6.0–8.5)

## 2020-06-05 LAB — CBC WITH DIFFERENTIAL/PLATELET
Basophils Absolute: 0 10*3/uL (ref 0.0–0.2)
Basos: 0 %
EOS (ABSOLUTE): 0.2 10*3/uL (ref 0.0–0.4)
Eos: 3 %
Hematocrit: 43.6 % (ref 37.5–51.0)
Hemoglobin: 14.6 g/dL (ref 13.0–17.7)
Immature Grans (Abs): 0 10*3/uL (ref 0.0–0.1)
Immature Granulocytes: 1 %
Lymphocytes Absolute: 1.1 10*3/uL (ref 0.7–3.1)
Lymphs: 17 %
MCH: 32.9 pg (ref 26.6–33.0)
MCHC: 33.5 g/dL (ref 31.5–35.7)
MCV: 98 fL — ABNORMAL HIGH (ref 79–97)
Monocytes Absolute: 0.7 10*3/uL (ref 0.1–0.9)
Monocytes: 11 %
Neutrophils Absolute: 4.4 10*3/uL (ref 1.4–7.0)
Neutrophils: 68 %
Platelets: 235 10*3/uL (ref 150–450)
RBC: 4.44 x10E6/uL (ref 4.14–5.80)
RDW: 11.8 % (ref 11.6–15.4)
WBC: 6.4 10*3/uL (ref 3.4–10.8)

## 2020-06-05 NOTE — Progress Notes (Signed)
I have read the note, and I agree with the clinical assessment and plan.  Krishay Faro K Nekeya Briski   

## 2020-06-11 ENCOUNTER — Ambulatory Visit: Payer: Medicare Other | Admitting: Dermatology

## 2020-06-11 ENCOUNTER — Encounter: Payer: Self-pay | Admitting: Dermatology

## 2020-06-11 ENCOUNTER — Other Ambulatory Visit: Payer: Self-pay

## 2020-06-11 DIAGNOSIS — D485 Neoplasm of uncertain behavior of skin: Secondary | ICD-10-CM

## 2020-06-11 DIAGNOSIS — L820 Inflamed seborrheic keratosis: Secondary | ICD-10-CM | POA: Diagnosis not present

## 2020-06-11 DIAGNOSIS — L309 Dermatitis, unspecified: Secondary | ICD-10-CM | POA: Diagnosis not present

## 2020-06-11 DIAGNOSIS — L570 Actinic keratosis: Secondary | ICD-10-CM

## 2020-06-11 NOTE — Patient Instructions (Addendum)
Recent flare and breaking out on the mandible sides of the face neck was treated by Mr. Arrazola with the original wounded warrior skin ointment which contains a compendium of botanicals and it would be impossible to say whether this is the reason he is improved or whether the improvement is spontaneous but both of [place] are quite pleased that there has been improvement. if he uses this again and it does not help he should be aware that and small letters on the back it says money back guarantee but it does not give time..  Biopsy, Surgery (Curettage) & Surgery (Excision) Aftercare Instructions  1. Okay to remove bandage in 24 hours  2. Wash area with soap and water  3. Apply Vaseline to area twice daily until healed (Not Neosporin)  4. Okay to cover with a Band-Aid to decrease the chance of infection or prevent irritation from clothing; also it's okay to uncover lesion at home.  5. Suture instructions: return to our office in 7-10 or 10-14 days for a nurse visit for suture removal. Variable healing with sutures, if pain or itching occurs call our office. It's okay to shower or bathe 24 hours after sutures are given.  6. The following risks may occur after a biopsy, curettage or excision: bleeding, scarring, discoloration, recurrence, infection (redness, yellow drainage, pain or swelling).  7. For questions, concerns and results call our office at [place] before 4pm & Friday before 3pm. Biopsy results will be available in 1 week.

## 2020-06-11 NOTE — Progress Notes (Signed)
LN2 x 1 on right temple LN2 x 2 on left temple LN2 x 2 on scalp

## 2020-06-20 DIAGNOSIS — Z23 Encounter for immunization: Secondary | ICD-10-CM | POA: Diagnosis not present

## 2020-07-02 DIAGNOSIS — R3915 Urgency of urination: Secondary | ICD-10-CM | POA: Diagnosis not present

## 2020-07-02 DIAGNOSIS — R351 Nocturia: Secondary | ICD-10-CM | POA: Diagnosis not present

## 2020-07-02 DIAGNOSIS — N401 Enlarged prostate with lower urinary tract symptoms: Secondary | ICD-10-CM | POA: Diagnosis not present

## 2020-07-03 ENCOUNTER — Other Ambulatory Visit: Payer: Self-pay | Admitting: Internal Medicine

## 2020-07-03 DIAGNOSIS — K862 Cyst of pancreas: Secondary | ICD-10-CM

## 2020-07-10 DIAGNOSIS — Z0120 Encounter for dental examination and cleaning without abnormal findings: Secondary | ICD-10-CM | POA: Diagnosis not present

## 2020-07-11 NOTE — Progress Notes (Signed)
   Follow-Up Visit   Subjective  Victor Sutton is a 86 y.o. male who presents for the following: Follow-up (MID BACK LARGE ISK AND LEFT HAND WANTS REMOVED).  Follow up Location:  Duration:  Quality:  Associated Signs/Symptoms: Modifying Factors:  Severity:  Timing: Context:   Objective  Well appearing patient in no apparent distress; mood and affect are within normal limits.  All skin waist up examined.  Recent flare and breaking out on the mandible sides of the face neck was treated by Victor Sutton with the original wounded warrior skin ointment which contains a compendium of botanicals and it would be impossible to say whether this is the reason he is improved or whether the improvement is spontaneous but both of [place] are quite pleased that there has been improvement if he uses this again and it does not help he should be aware that and small letters on the back it says money back guarantee but it does not give time..   Assessment & Plan    Neoplasm of uncertain behavior of skin Mid Back  Skin / nail biopsy Type of biopsy: tangential   Informed consent: discussed and consent obtained   Timeout: patient name, date of birth, surgical site, and procedure verified   Anesthesia: the lesion was anesthetized in a standard fashion   Anesthetic:  1% lidocaine w/ epinephrine 1-100,000 local infiltration Instrument used: flexible razor blade   Hemostasis achieved with: ferric subsulfate   Outcome: patient tolerated procedure well   Post-procedure details: sterile dressing applied and wound care instructions given   Dressing type: bandage and petrolatum    Specimen 1 - Surgical pathology Differential Diagnosis: BCC SCC Check Margins: No  Dermatitis (2) Left Anterior Mandible; Right Anterior Mandible  Patient is doing much better after he treated himself with  original wounded warrior skin ointment which contains a compendium of botanicals  AK (actinic keratosis) (3) Mid  Frontal Scalp; Left Temple; Right Temple  Destruction of lesion - Left Temple, Mid Frontal Scalp, Right Temple Complexity: simple   Destruction method: cryotherapy   Informed consent: discussed and consent obtained   Lesion destroyed using liquid nitrogen: Yes   Cryotherapy cycles:  5 Outcome: patient tolerated procedure well with no complications       I, Victor Monarch, MD, have reviewed all documentation for this visit.  The documentation on 07/15/20 for the exam, diagnosis, procedures, and orders are all accurate and complete.

## 2020-07-15 ENCOUNTER — Encounter: Payer: Self-pay | Admitting: Dermatology

## 2020-07-18 ENCOUNTER — Ambulatory Visit
Admission: RE | Admit: 2020-07-18 | Discharge: 2020-07-18 | Disposition: A | Discharge status: Home / Self Care | Payer: Medicare Other | Source: Ambulatory Visit | Attending: Internal Medicine | Admitting: Internal Medicine

## 2020-07-18 DIAGNOSIS — K862 Cyst of pancreas: Secondary | ICD-10-CM

## 2020-07-18 DIAGNOSIS — K869 Disease of pancreas, unspecified: Secondary | ICD-10-CM | POA: Diagnosis not present

## 2020-07-18 MED ORDER — GADOBENATE DIMEGLUMINE 529 MG/ML IV SOLN
15.0000 mL | Freq: Once | INTRAVENOUS | Status: AC | PRN
  Administered 2020-07-18: 15 mL via INTRAVENOUS

## 2020-07-19 DIAGNOSIS — Z0120 Encounter for dental examination and cleaning without abnormal findings: Secondary | ICD-10-CM | POA: Diagnosis not present

## 2020-08-07 DIAGNOSIS — R351 Nocturia: Secondary | ICD-10-CM | POA: Diagnosis not present

## 2020-08-07 DIAGNOSIS — N401 Enlarged prostate with lower urinary tract symptoms: Secondary | ICD-10-CM | POA: Diagnosis not present

## 2020-10-22 DIAGNOSIS — I10 Essential (primary) hypertension: Secondary | ICD-10-CM | POA: Diagnosis not present

## 2020-10-22 DIAGNOSIS — E538 Deficiency of other specified B group vitamins: Secondary | ICD-10-CM | POA: Diagnosis not present

## 2020-10-29 DIAGNOSIS — R82998 Other abnormal findings in urine: Secondary | ICD-10-CM | POA: Diagnosis not present

## 2020-10-29 DIAGNOSIS — I2510 Atherosclerotic heart disease of native coronary artery without angina pectoris: Secondary | ICD-10-CM | POA: Diagnosis not present

## 2020-10-29 DIAGNOSIS — Z0000 Encounter for general adult medical examination without abnormal findings: Secondary | ICD-10-CM | POA: Diagnosis not present

## 2020-10-29 DIAGNOSIS — I10 Essential (primary) hypertension: Secondary | ICD-10-CM | POA: Diagnosis not present

## 2020-10-29 DIAGNOSIS — G7000 Myasthenia gravis without (acute) exacerbation: Secondary | ICD-10-CM | POA: Diagnosis not present

## 2020-11-02 ENCOUNTER — Ambulatory Visit: Payer: Medicare Other | Attending: Internal Medicine

## 2020-11-02 ENCOUNTER — Other Ambulatory Visit: Payer: Self-pay | Admitting: Internal Medicine

## 2020-11-02 DIAGNOSIS — Z23 Encounter for immunization: Secondary | ICD-10-CM

## 2020-11-02 NOTE — Progress Notes (Signed)
   Covid-19 Vaccination Clinic  Name:  Victor Sutton    MRN: [id number] DOB: 14-May-1928  11/02/2020  Victor Sutton was observed post Covid-19 immunization for 30 minutes based on pre-vaccination screening without incident. He was provided with Vaccine Information Sheet and instruction to access the V-Safe system.   Victor Sutton was instructed to call 911 with any severe reactions post vaccine: Marland Kitchen Difficulty breathing  . Swelling of face and throat  . A fast heartbeat  . A bad rash all over body  . Dizziness and weakness   Immunizations Administered    Name Date Dose VIS Date Route   PFIZER Comrnaty(Gray TOP) Covid-19 Vaccine 11/02/2020 10:08 AM 0.3 mL 08/23/2020 Intramuscular   Manufacturer: [employer]   Lot: [id number]   NDC: [id number]

## 2020-11-06 DIAGNOSIS — H35341 Macular cyst, hole, or pseudohole, right eye: Secondary | ICD-10-CM | POA: Diagnosis not present

## 2020-11-06 DIAGNOSIS — Z961 Presence of intraocular lens: Secondary | ICD-10-CM | POA: Diagnosis not present

## 2020-11-06 DIAGNOSIS — H353130 Nonexudative age-related macular degeneration, bilateral, stage unspecified: Secondary | ICD-10-CM | POA: Diagnosis not present

## 2020-11-12 DIAGNOSIS — N401 Enlarged prostate with lower urinary tract symptoms: Secondary | ICD-10-CM | POA: Diagnosis not present

## 2020-11-12 DIAGNOSIS — R351 Nocturia: Secondary | ICD-10-CM | POA: Diagnosis not present

## 2020-11-12 DIAGNOSIS — N3000 Acute cystitis without hematuria: Secondary | ICD-10-CM | POA: Diagnosis not present

## 2020-11-16 ENCOUNTER — Other Ambulatory Visit (HOSPITAL_COMMUNITY): Payer: Self-pay | Admitting: Internal Medicine

## 2020-11-16 DIAGNOSIS — R011 Cardiac murmur, unspecified: Secondary | ICD-10-CM

## 2020-11-26 ENCOUNTER — Ambulatory Visit (INDEPENDENT_AMBULATORY_CARE_PROVIDER_SITE_OTHER): Payer: Medicare Other

## 2020-11-26 ENCOUNTER — Encounter: Payer: Self-pay | Admitting: Cardiology

## 2020-11-26 ENCOUNTER — Ambulatory Visit: Payer: Medicare Other | Admitting: Cardiology

## 2020-11-26 ENCOUNTER — Ambulatory Visit (INDEPENDENT_AMBULATORY_CARE_PROVIDER_SITE_OTHER): Payer: Medicare Other | Admitting: Cardiology

## 2020-11-26 ENCOUNTER — Encounter: Payer: Self-pay | Admitting: *Deleted

## 2020-11-26 ENCOUNTER — Other Ambulatory Visit: Payer: Self-pay

## 2020-11-26 VITALS — BP 140/86 | HR 74 | Ht 69.0 in | Wt 168.0 lb

## 2020-11-26 DIAGNOSIS — R002 Palpitations: Secondary | ICD-10-CM | POA: Diagnosis not present

## 2020-11-26 DIAGNOSIS — R011 Cardiac murmur, unspecified: Secondary | ICD-10-CM

## 2020-11-26 DIAGNOSIS — I10 Essential (primary) hypertension: Secondary | ICD-10-CM

## 2020-11-26 DIAGNOSIS — I4892 Unspecified atrial flutter: Secondary | ICD-10-CM | POA: Diagnosis not present

## 2020-11-26 NOTE — Patient Instructions (Signed)
Your physician recommends that you continue on your current medications as directed. Please refer to the Current Medication list given to you today.  Testing/Procedures: Echocardiogram as scheduled 3/30   ZIO XT- Long Term Monitor Instructions   Your physician has requested you wear your ZIO patch monitor 14 days.   This is a single patch monitor.  [employer] supplies one patch monitor per enrollment.  Additional stickers are not available.   Please do not apply patch if you will be having a Nuclear Stress Test, Echocardiogram, Cardiac CT, MRI, or Chest Xray during the time frame you would be wearing the monitor. The patch cannot be worn during these tests.  You cannot remove and re-apply the ZIO XT patch monitor.   Your ZIO patch monitor will be sent USPS Priority mail from [facility] directly to your home address. The monitor may also be mailed to a PO BOX if home delivery is not available.   It may take 3-5 days to receive your monitor after you have been enrolled.   Once you have received you monitor, please review enclosed instructions.  Your monitor has already been registered assigning a specific monitor serial # to you.   Applying the monitor   Shave hair from upper left chest.   Hold abrader disc by orange tab.  Rub abrader in 40 strokes over left upper chest as indicated in your monitor instructions.   Clean area with 4 enclosed alcohol pads .  Use all pads to assure are is cleaned thoroughly.  Let dry.   Apply patch as indicated in monitor instructions.  Patch will be place under collarbone on left side of chest with arrow pointing upward.   Rub patch adhesive wings for 2 minutes.Remove white label marked "1".  Remove white label marked "2".  Rub patch adhesive wings for 2 additional minutes.   While looking in a mirror, press and release button in center of patch.  A small green light will flash 3-4 times .  This will be your only indicator the monitor has been turned  on.     Do not shower for the first 24 hours.  You may shower after the first 24 hours.   Press button if you feel a symptom. You will hear a small click.  Record Date, Time and Symptom in the Patient Log Book.   When you are ready to remove patch, follow instructions on last 2 pages of Patient Log Book.  Stick patch monitor onto last page of Patient Log Book.   Place Patient Log Book in [place] box.  Use locking tab on box and tape box closed securely.  The [employer] has [employer] on it.  Please place in mailbox as soon as possible.  Your physician should have your test results approximately 7 days after the monitor has been mailed back to [facility].   Call [place] at [phone] if you have questions regarding your ZIO XT patch monitor.  Call them immediately if you see an orange light blinking on your monitor.   If your monitor falls off in less than 4 days contact our Monitor department at [phone].  If your monitor becomes loose or falls off after 4 days call [employer] at [phone] for suggestions on securing your monitor.    Follow-Up: At [facility], you and your health needs are our priority.  As part of our continuing mission to provide you with exceptional heart care, we have created designated Provider  Care Teams.  These Care Teams include your primary Cardiologist (physician) and Advanced Practice Providers (APPs -  Physician Assistants and Nurse Practitioners) who all work together to provide you with the care you need, when you need it.  We recommend signing up for the patient portal called "MyChart".  Sign up information is provided on this After Visit Summary.  MyChart is used to connect with patients for Virtual Visits (Telemedicine).  Patients are able to view lab/test results, encounter notes, upcoming appointments, etc.  Non-urgent messages can be sent to your provider as well.   To learn more about what you can do with  MyChart, go to [email or web address].    Your next appointment:   3 month(s)  The format for your next appointment:   In Person  Provider:   Oswaldo Milian, MD

## 2020-11-26 NOTE — Progress Notes (Signed)
Cardiology Office Note:    Date:  11/26/2020   ID:  Victor Sutton, DOB 04-12-1928, MRN [id number]  PCP:  Victor Organ., MD  Cardiologist:  No primary care provider on file.  Electrophysiologist:  None   Referring MD: Victor Organ., MD   Chief Complaint  Patient presents with  . Shortness of Breath    History of Present Illness:    Victor Sutton is a 87 y.o. male with a hx of myasthenia gravis, atrial flutter following abdominal surgery in 2014, BPH, GERD, hyperlipidemia, hypertension who is referred by Victor Sutton for evaluation of murmur.  He was seen by cardiology in 2014 after noted to have tachycardia post abdominal surgery.  Adenosine was administered and rhythm was determined to be atrial flutter.  He was not started on anticoagulation.  Echocardiogram 02/09/2013 showed normal biventricular function, moderate LVH, no significant valvular disease.  Labs on 10/23/2020 showed creatinine 0.8, sodium 133, potassium 3.7, albumin 3.7, WBC 4.7, hemoglobin 15.1, LDL 72, TSH 1.7.  He denies any chest pain but reports he has been having shortness of breath.  Occurs with minimal exertion such as cleaning his shower.  He denies any lightheadedness or syncope.  Reports he has had swelling of his legs for years.  Is on hydrochlorothiazide.  Reports he had an episode in February of palpitations where he felt his heart was racing.  Lasted for 3 to 4 minutes.  Smoked 0.5 pdd x 20 years, quit smoking in 1978.  Father had MI at 31.  Sister had CABG in 67s.      Past Medical History:  Diagnosis Date  . Adenomatous colon polyp   . B12 deficiency    HIstory  . Basal cell carcinoma 08/18/2006   BASOSQUAMOUS LEFT OUTER FOREHEAD TX CX3 , EXC  . BPH (benign prostatic hyperplasia)   . Cameron ulcer   . Cancer ([place])    precancerous skin lesions on leg   . Cataract 2009   bilateral cataract surgery  . Delayed gastric emptying   . Disturbance of salivary secretion 06/20/2013  . Dysphagia,  pharyngoesophageal phase 03/16/2013  . Dysphonia 03/16/2013  . Gait abnormality 11/29/2019  . GERD (gastroesophageal reflux disease)   . Hiatal hernia    hiatial hernia repair  02/08/13  . History of blood transfusion   . Hyperlipidemia    not on medication  . Hypertension   . Iron deficiency anemia   . Leukoplakia 2001   Treated for  . Macular degeneration    (L) wet, (R) dry  . Myasthenia gravis ([place]) 03/21/2013  . Peripheral neuropathy    Mild History of  . Polyneuritis 1965   Resolved  . SCC (squamous cell carcinoma) 03/12/2011   SCC LEFT INNER CHEEK CX3 5FU  . SCC (squamous cell carcinoma) 08/24/2012   RIGHT LOWER SHIN TX WITH BX MOHS  . SCC (squamous cell carcinoma) 07/26/2014   LEFT UNDER CHEEK CX3 5FU  . Squamous cell carcinoma of skin 05/03/2008   SCC INSITU RIGHT V OF CHEST TX EXC  . Ulcer   . Vitamin D deficiency     Past Surgical History:  Procedure Laterality Date  . Arthroscopic knee surgery     left  . cateract surgeries      bilateral  . CHOLECYSTECTOMY    . COLONOSCOPY WITH PROPOFOL N/A 02/16/2017   Procedure: COLONOSCOPY WITH PROPOFOL;  Surgeon: Victor Fair, MD;  Location: [facility];  Service: Endoscopy;  Laterality: N/A;  .  GASTROSTOMY W/ FEEDING TUBE  [id number]  . HERNIA REPAIR     Laparoscopic ventral  . HIATAL HERNIA REPAIR  2004  . HIATAL HERNIA REPAIR N/A 02/08/2013   Procedure:  REPAIR OFCURRENT HIATAL HERNIA, ;  Surgeon: Odis Hollingshead, MD;  Location: [facility];  Service: General;  Laterality: N/A;  . LAPAROSCOPIC LYSIS OF ADHESIONS N/A 02/08/2013   Procedure: LAPAROSCOPIC LYSIS OF ADHESIONS;  Surgeon: Odis Hollingshead, MD;  Location: [facility];  Service: General;  Laterality: N/A;  . LAPAROSCOPIC NISSEN FUNDOPLICATION  6/64/4034   Procedure: LAPAROSCOPIC NISSEN FUNDOPLICATION;  Surgeon: Odis Hollingshead, MD;  Location: [facility];  Service: General;;  . Multiple oral surgeries  2002  . SKIN CANCER EXCISION  10/2012   right leg-shin area  .  STOMACH SURGERY  2005   states his stomach had moved up toward his chest , some type of surgery performed  . TONSILLECTOMY    . UPPER GI ENDOSCOPY N/A 02/08/2013   Procedure: UPPER GI ENDOSCOPY;  Surgeon: Odis Hollingshead, MD;  Location: [facility];  Service: General;  Laterality: N/A;    Current Medications: Current Meds  Medication Sig  . hydrochlorothiazide (HYDRODIURIL) 25 MG tablet Take 1 tablet (25 mg total) by mouth daily.  . Multiple Vitamins-Minerals (ICAPS) CAPS Take 1 capsule by mouth 2 (two) times daily.  . mupirocin ointment (BACTROBAN) 2 % Apply 1 application topically 2 (two) times daily.  . mycophenolate (CELLCEPT) 500 MG tablet Take 1 tablet (500 mg total) by mouth daily.  . pantoprazole (PROTONIX) 40 MG tablet Take 40 mg by mouth daily. May take an additional 40mg s as needed for heartburn  . potassium chloride (KLOR-CON) 10 MEQ tablet TAKE 1 TABLET BY MOUTH ONCE DAILY  . tamsulosin (FLOMAX) 0.4 MG CAPS capsule 0.4 mg as needed.   Marland Kitchen UNABLE TO FIND Take 1 tablet by mouth daily. Med Name:[employer] - zeaxanthin 10mg  and lutein 10mg   . Vitamin D, Ergocalciferol, (DRISDOL) 50000 UNITS CAPS capsule Take 50,000 Units by mouth every 7 (seven) days. ON FRIDAYS  . zoledronic acid (RECLAST) 5 MG/100ML SOLN injection Inject 5 mg into the vein once. YEARLY     Allergies:   Ciprofloxacin and Quinolones   Social History   Socioeconomic History  . Marital status: Married    Spouse name: Victor Sutton  . Number of children: 3  . Years of education: 11  . Highest education level: Not on file  Occupational History  . Occupation: Retired    Fish farm manager: RETIRED  Tobacco Use  . Smoking status: Former Smoker    Quit date: 09/15/1976    Years since quitting: 44.2  . Smokeless tobacco: Never Used  . Tobacco comment: Quit 1980  Vaping Use  . Vaping Use: Never used  Substance and Sexual Activity  . Alcohol use: No    Comment: Moderate alcohol, wine  . Drug use: No  . Sexual activity: Not on  file  Other Topics Concern  . Not on file  Social History Narrative   Lives at home, married   Patient is right-handed.   Patient drinks caffeine occasionally.      Social Determinants of Health   Financial Resource Strain: Not on file  Food Insecurity: Not on file  Transportation Needs: Not on file  Physical Activity: Not on file  Stress: Not on file  Social Connections: Not on file     Family History: The patient's family history includes Breast cancer in his sister; CAD in his sister; COPD in  his sister; Colon cancer (age of onset: 27) in his son and son; Diabetes in his father; Heart attack in his father; Prostate cancer in his paternal grandfather.  ROS:   Please see the history of present illness.     All other systems reviewed and are negative.  EKGs/Labs/Other Studies Reviewed:    The following studies were reviewed today:   EKG:  EKG is ordered today.  The ekg ordered today demonstrates normal sinus rhythm, rate 74, no ST/T abnormality  Recent Labs: 11/29/2019: TSH 2.880 06/04/2020: ALT 15; BUN 11; Creatinine, Ser 0.84; Hemoglobin 14.6; Platelets 235; Potassium 3.6; Sodium 137  Recent Lipid Panel    Component Value Date/Time   CHOL 118 02/14/2013 0530   TRIG 79 02/21/2013 0520    Physical Exam:    VS:  BP 140/86   Sutton 76   Ht 5\' 9"  (1.753 m)   Wt 168 lb (76.2 kg)   BMI 24.81 kg/m     Wt Readings from Last 3 Encounters:  11/26/20 168 lb (76.2 kg)  06/04/20 170 lb (77.1 kg)  11/29/19 170 lb 8 oz (77.3 kg)     GEN:  in no acute distress HEENT: Normal NECK: No JVD; No carotid bruits LYMPHATICS: No lymphadenopathy CARDIAC: Normal rate, regular, 2 out of 6 systolic murmur loudest at RUSB RESPIRATORY:  Clear to auscultation without rales, wheezing or rhonchi  ABDOMEN: Soft, non-tender, non-distended MUSCULOSKELETAL:  No edema; No deformity  SKIN: Warm and dry NEUROLOGIC:  Alert and oriented x 3 PSYCHIATRIC:  Normal affect   ASSESSMENT:    1.  Heart murmur   2. Palpitations   3. Atrial flutter, unspecified type ([place])   4. Essential hypertension    PLAN:    Heart murmur: 2/6 systolic ejection murmur.  Echocardiogram has been ordered, scheduled for 3/30.  Atrial flutter: Occurred following hernia surgery in 2014, no evidence of recurrence.  CHA2DS2-VASc score 3 (hypertension, age x2) but was not been started on anticoagulation.  Has had no known recurrence of atrial flutter but reports recent episode of palpitations -Will evaluate with Zio patch x2 weeks  Hypertension: On hydrochlorothiazide 25 mg daily  Myasthenia gravis: On CellCept  RTC in 3 months  Medication Adjustments/Labs and Tests Ordered: Current medicines are reviewed at length with the patient today.  Concerns regarding medicines are outlined above.  Orders Placed This Encounter  Procedures  . LONG TERM MONITOR (3-14 DAYS)  . EKG 12-Lead   No orders of the defined types were placed in this encounter.   Patient Instructions  Your physician recommends that you continue on your current medications as directed. Please refer to the Current Medication list given to you today.  Testing/Procedures: Echocardiogram as scheduled 3/30   ZIO XT- Long Term Monitor Instructions   Your physician has requested you wear your ZIO patch monitor 14 days.   This is a single patch monitor.  [employer] supplies one patch monitor per enrollment.  Additional stickers are not available.   Please do not apply patch if you will be having a Nuclear Stress Test, Echocardiogram, Cardiac CT, MRI, or Chest Xray during the time frame you would be wearing the monitor. The patch cannot be worn during these tests.  You cannot remove and re-apply the ZIO XT patch monitor.   Your ZIO patch monitor will be sent USPS Priority mail from [facility] directly to your home address. The monitor may also be mailed to a PO BOX if home delivery is not available.  It may take 3-5 days to receive  your monitor after you have been enrolled.   Once you have received you monitor, please review enclosed instructions.  Your monitor has already been registered assigning a specific monitor serial # to you.   Applying the monitor   Shave hair from upper left chest.   Hold abrader disc by orange tab.  Rub abrader in 40 strokes over left upper chest as indicated in your monitor instructions.   Clean area with 4 enclosed alcohol pads .  Use all pads to assure are is cleaned thoroughly.  Let dry.   Apply patch as indicated in monitor instructions.  Patch will be place under collarbone on left side of chest with arrow pointing upward.   Rub patch adhesive wings for 2 minutes.Remove white label marked "1".  Remove white label marked "2".  Rub patch adhesive wings for 2 additional minutes.   While looking in a mirror, press and release button in center of patch.  A small green light will flash 3-4 times .  This will be your only indicator the monitor has been turned on.     Do not shower for the first 24 hours.  You may shower after the first 24 hours.   Press button if you feel a symptom. You will hear a small click.  Record Date, Time and Symptom in the Patient Log Book.   When you are ready to remove patch, follow instructions on last 2 pages of Patient Log Book.  Stick patch monitor onto last page of Patient Log Book.   Place Patient Log Book in [place] box.  Use locking tab on box and tape box closed securely.  The [employer] has [employer] on it.  Please place in mailbox as soon as possible.  Your physician should have your test results approximately 7 days after the monitor has been mailed back to [facility].   Call [facility] at [phone] if you have questions regarding your ZIO XT patch monitor.  Call them immediately if you see an orange light blinking on your monitor.   If your monitor falls off in less than 4 days contact our Monitor department at  [phone].  If your monitor becomes loose or falls off after 4 days call [employer] at [phone] for suggestions on securing your monitor.    Follow-Up: At [facility], you and your health needs are our priority.  As part of our continuing mission to provide you with exceptional heart care, we have created designated Provider Care Teams.  These Care Teams include your primary Cardiologist (physician) and Advanced Practice Providers (APPs -  Physician Assistants and Nurse Practitioners) who all work together to provide you with the care you need, when you need it.  We recommend signing up for the patient portal called "MyChart".  Sign up information is provided on this After Visit Summary.  MyChart is used to connect with patients for Virtual Visits (Telemedicine).  Patients are able to view lab/test results, encounter notes, upcoming appointments, etc.  Non-urgent messages can be sent to your provider as well.   To learn more about what you can do with MyChart, go to [email or web address].    Your next appointment:   3 month(s)  The format for your next appointment:   In Person  Provider:   Oswaldo Milian, MD       Signed, Donato Heinz, MD  11/26/2020 9:43 AM    [facility]

## 2020-11-26 NOTE — Progress Notes (Signed)
Patient ID: Victor Sutton, male   DOB: 18-Apr-1928, 87 y.o.   MRN: [id number] Patient enrolled for [employer] to ship a 14 day ZIO XT long term holter monitor to his home.

## 2020-11-29 ENCOUNTER — Other Ambulatory Visit (HOSPITAL_COMMUNITY): Payer: Self-pay | Admitting: Internal Medicine

## 2020-11-29 DIAGNOSIS — R011 Cardiac murmur, unspecified: Secondary | ICD-10-CM

## 2020-12-03 ENCOUNTER — Ambulatory Visit: Payer: Medicare Other | Admitting: Neurology

## 2020-12-03 ENCOUNTER — Other Ambulatory Visit: Payer: Self-pay

## 2020-12-03 ENCOUNTER — Encounter: Payer: Self-pay | Admitting: Neurology

## 2020-12-03 VITALS — BP 122/69 | HR 73 | Ht 69.0 in | Wt 164.0 lb

## 2020-12-03 DIAGNOSIS — G7000 Myasthenia gravis without (acute) exacerbation: Secondary | ICD-10-CM

## 2020-12-03 MED ORDER — MYCOPHENOLATE MOFETIL 500 MG PO TABS: 500.0000 mg | ORAL_TABLET | Freq: Every day | ORAL | 3 refills | Status: DC

## 2020-12-03 NOTE — Patient Instructions (Signed)
Continue the Cellcept  Check labs today  See you back in 6 months

## 2020-12-03 NOTE — Progress Notes (Signed)
PATIENT: Victor Sutton DOB: 08-02-1928  REASON FOR VISIT: follow up HISTORY FROM: patient  HISTORY OF PRESENT ILLNESS: Today 12/03/20 Victor Sutton is a 87 year old male with history of myasthenia gravis with pharyngeal onset.  He is on low-dose CellCept and takes Mestinon as needed. Rarely takes Mestinon, just keeps on hand.  Denies any trouble swallowing.  He has gastroparesis, is slow with his eating, doesn't eat large amounts.  With walking can veer off, this is improved with walking for exercise. He is going on a [place] 1-day trip to [place] in a few weeks.  Seeing cardiology, reported sensation of heart fluttering, having echocardiogram, wearing heart monitor.  No falls.  Requesting B12 checked, is on supplement.  Here today for evaluation accompanied by his wife.  Update 06/04/2020 SS: Victor Sutton is a 87 year old male with history of myasthenia gravis with pharyngeal onset.  He is on low-dose CellCept and takes Mestinon as needed.  He rarely takes Mestinon, only if fullness sensation after meal, has not taken in several months.  Denies double vision or ptosis-he pays attention to this daily.  No trouble swallowing.  He is not very active, enjoys reading. He has noted weaving with walking for several years, has been stable.  He has declining vision, he can drive, but his wife does most driving. Looking forward to cooler weather to get outside walking.  Presents today for evaluation unaccompanied.  HISTORY 11/29/2019 Dr. Jannifer Franklin: Victor Sutton is an 87 year old right-handed white male with a history of myasthenia gravis with pharyngeal onset.  The patient has done well on low-dose CellCept, he is taking the Mestinon only if needed.  He was having stomach upset and diarrhea on the medication.  The patient has not had any significant issues with swallowing or speech changes.  He denies any double vision or ptosis.  He has had a chronic issue with some mild gait instability that he claims has been present  since 2002.  His wife is noted that he tends to weave a little bit when he walks down the hall.  He has not had any falls.  He reports no numbness in the feet.  The patient goes on to say that before he moved here from [place] he was on B12 shots but he has not continued B12 supplementation.  He returns for an evaluation.   REVIEW OF SYSTEMS: Out of a complete 14 system review of symptoms, the patient complains only of the following symptoms, and all other reviewed systems are negative.  N/A  ALLERGIES: Allergies  Allergen Reactions  . Ciprofloxacin Anaphylaxis    Avoid due to myasthenia gravis  . Quinolones Anaphylaxis    Avoid due to myasthenia gravis    HOME MEDICATIONS: Outpatient Medications Prior to Visit  Medication Sig Dispense Refill  . hydrochlorothiazide (HYDRODIURIL) 25 MG tablet Take 1 tablet (25 mg total) by mouth daily. 90 tablet 1  . Multiple Vitamins-Minerals (ICAPS) CAPS Take 1 capsule by mouth 2 (two) times daily.    . mupirocin ointment (BACTROBAN) 2 % Apply 1 application topically 2 (two) times daily. 22 g 0  . pantoprazole (PROTONIX) 40 MG tablet Take 40 mg by mouth daily. May take an additional 40mg s as needed for heartburn    . potassium chloride (KLOR-CON) 10 MEQ tablet TAKE 1 TABLET BY MOUTH ONCE DAILY 90 tablet 3  . tamsulosin (FLOMAX) 0.4 MG CAPS capsule 0.4 mg as needed.   3  . UNABLE TO FIND Take 1 tablet by mouth  daily. Med Name:[facility] - zeaxanthin 10mg  and lutein 10mg     . Vitamin D, Ergocalciferol, (DRISDOL) 50000 UNITS CAPS capsule Take 50,000 Units by mouth every 7 (seven) days. ON FRIDAYS    . zoledronic acid (RECLAST) 5 MG/100ML SOLN injection Inject 5 mg into the vein once. YEARLY    . mycophenolate (CELLCEPT) 500 MG tablet Take 1 tablet (500 mg total) by mouth daily. 90 tablet 3   No facility-administered medications prior to visit.    PAST MEDICAL HISTORY: Past Medical History:  Diagnosis Date  . Adenomatous colon polyp   . B12  deficiency    [facility]  . Basal cell carcinoma 08/18/2006   BASOSQUAMOUS LEFT OUTER FOREHEAD TX CX3 , EXC  . BPH (benign prostatic hyperplasia)   . [place] ulcer   . Cancer ([place])    precancerous skin lesions on leg   . Cataract 2009   bilateral cataract surgery  . Delayed gastric emptying   . Disturbance of salivary secretion 06/20/2013  . Dysphagia, pharyngoesophageal phase 03/16/2013  . Dysphonia 03/16/2013  . Gait abnormality 11/29/2019  . GERD (gastroesophageal reflux disease)   . Hiatal hernia    hiatial hernia repair  02/08/13  . History of blood transfusion   . Hyperlipidemia    not on medication  . Hypertension   . Iron deficiency anemia   . Leukoplakia 2001   Treated for  . Macular degeneration    (L) wet, (R) dry  . Myasthenia gravis ([place]) 03/21/2013  . Peripheral neuropathy    Mild History of  . Polyneuritis 1965   Resolved  . SCC (squamous cell carcinoma) 03/12/2011   SCC LEFT INNER CHEEK CX3 5FU  . SCC (squamous cell carcinoma) 08/24/2012   RIGHT LOWER SHIN TX WITH BX MOHS  . SCC (squamous cell carcinoma) 07/26/2014   LEFT UNDER CHEEK CX3 5FU  . Squamous cell carcinoma of skin 05/03/2008   SCC INSITU RIGHT V OF CHEST TX EXC  . Ulcer   . Vitamin D deficiency     PAST SURGICAL HISTORY: Past Surgical History:  Procedure Laterality Date  . Arthroscopic knee surgery     left  . cateract surgeries      bilateral  . CHOLECYSTECTOMY    . COLONOSCOPY WITH PROPOFOL N/A 02/16/2017   Procedure: COLONOSCOPY WITH PROPOFOL;  Surgeon: Garlan Fair, MD;  Location: [facility];  Service: Endoscopy;  Laterality: N/A;  . GASTROSTOMY W/ FEEDING TUBE  [id number]  . HERNIA REPAIR     Laparoscopic ventral  . HIATAL HERNIA REPAIR  2004  . HIATAL HERNIA REPAIR N/A 02/08/2013   Procedure:  REPAIR OFCURRENT HIATAL HERNIA, ;  Surgeon: Odis Hollingshead, MD;  Location: [facility];  Service: General;  Laterality: N/A;  . LAPAROSCOPIC LYSIS OF ADHESIONS N/A 02/08/2013   Procedure:  LAPAROSCOPIC LYSIS OF ADHESIONS;  Surgeon: Odis Hollingshead, MD;  Location: [facility];  Service: General;  Laterality: N/A;  . LAPAROSCOPIC NISSEN FUNDOPLICATION  5/36/6440   Procedure: LAPAROSCOPIC NISSEN FUNDOPLICATION;  Surgeon: Odis Hollingshead, MD;  Location: [facility];  Service: General;;  . Multiple oral surgeries  2002  . SKIN CANCER EXCISION  10/2012   right leg-shin area  . STOMACH SURGERY  2005   states his stomach had moved up toward his chest , some type of surgery performed  . TONSILLECTOMY    . UPPER GI ENDOSCOPY N/A 02/08/2013   Procedure: UPPER GI ENDOSCOPY;  Surgeon: Odis Hollingshead, MD;  Location: [facility];  Service: General;  Laterality: N/A;    FAMILY HISTORY: Family History  Problem Relation Age of Onset  . Breast cancer Sister   . CAD Sister   . COPD Sister   . Diabetes Father   . Heart attack Father   . Colon cancer Son 58  . Prostate cancer Paternal Grandfather   . Colon cancer Son 40    SOCIAL HISTORY: Social History   Socioeconomic History  . Marital status: Married    Spouse name: [name]  . Number of children: 3  . Years of education: 68  . Highest education level: Not on file  Occupational History  . Occupation: Retired    Fish farm manager: RETIRED  Tobacco Use  . Smoking status: Former Smoker    Quit date: 09/15/1976    Years since quitting: 44.2  . Smokeless tobacco: Never Used  . Tobacco comment: Quit 1980  Vaping Use  . Vaping Use: Never used  Substance and Sexual Activity  . Alcohol use: No    Comment: Moderate alcohol, wine  . Drug use: No  . Sexual activity: Not on file  Other Topics Concern  . Not on file  Social History Narrative   Lives at home, married   Patient is right-handed.   Patient drinks caffeine occasionally.      Social Determinants of Health   Financial Resource Strain: Not on file  Food Insecurity: Not on file  Transportation Needs: Not on file  Physical Activity: Not on file  Stress: Not on file  Social  Connections: Not on file  Intimate Partner Violence: Not on file   PHYSICAL EXAM  Vitals:   12/03/20 1104  BP: 122/69  Pulse: 73  Weight: 164 lb (74.4 kg)  Height: 5\' 9"  (1.753 m)   Body mass index is 24.22 kg/m.  Generalized: Well developed, in no acute distress  Neurological examination  Mentation: Alert oriented to time, place, history taking. Follows all commands speech and language fluent Cranial nerve II-XII: Pupils were equal round reactive to light. Extraocular movements were full, visual field were full on confrontational test. Facial sensation and strength were normal.  Head turning and shoulder shrug were normal and symmetric.  No cheek puff or eye closure weakness noted. Motor: The motor testing reveals 5 over 5 strength of all 4 extremities. Good symmetric motor tone is noted throughout.  Sensory: Sensory testing is intact to soft touch on all 4 extremities. No evidence of extinction is noted.  Coordination: Cerebellar testing reveals good finger-nose-finger and heel-to-shin bilaterally.  Gait and station: Gait is slightly wide-based but steady Reflexes: Deep tendon reflexes are symmetric and normal bilaterally.   DIAGNOSTIC DATA (LABS, IMAGING, TESTING) - I reviewed patient records, labs, notes, testing and imaging myself where available.  Lab Results  Component Value Date   WBC 6.4 06/04/2020   HGB 14.6 06/04/2020   HCT 43.6 06/04/2020   MCV 98 (H) 06/04/2020   PLT 235 06/04/2020      Component Value Date/Time   NA 137 06/04/2020 0908   K 3.6 06/04/2020 0908   CL 97 06/04/2020 0908   CO2 29 06/04/2020 0908   GLUCOSE 90 06/04/2020 0908   GLUCOSE 105 (H) 02/11/2014 1131   BUN 11 06/04/2020 0908   CREATININE 0.84 06/04/2020 0908   CALCIUM 9.4 06/04/2020 0908   PROT 6.5 06/04/2020 0908   ALBUMIN 3.9 06/04/2020 0908   AST 25 06/04/2020 0908   ALT 15 06/04/2020 0908   ALKPHOS 49 06/04/2020 0908   BILITOT 1.0 06/04/2020 0908  GFRNONAA 78 06/04/2020 0908    GFRAA 90 06/04/2020 0908   Lab Results  Component Value Date   CHOL 118 02/14/2013   TRIG 79 02/21/2013   No results found for: HGBA1C Lab Results  Component Value Date   VITAMINB12 >2000 (H) 11/29/2019   Lab Results  Component Value Date   TSH 2.880 11/29/2019   ASSESSMENT AND PLAN 87 y.o. year old male  has a past medical history of Adenomatous colon polyp, B12 deficiency, Basal cell carcinoma (08/18/2006), BPH (benign prostatic hyperplasia), Lysbeth Galas ulcer, Cancer [facility]), Cataract (2009), Delayed gastric emptying, Disturbance of salivary secretion (06/20/2013), Dysphagia, pharyngoesophageal phase (03/16/2013), Dysphonia (03/16/2013), Gait abnormality (11/29/2019), GERD (gastroesophageal reflux disease), Hiatal hernia, History of blood transfusion, Hyperlipidemia, Hypertension, Iron deficiency anemia, Leukoplakia (2001), Macular degeneration, Myasthenia gravis ([place]) (03/21/2013), Peripheral neuropathy, Polyneuritis (1965), SCC (squamous cell carcinoma) (03/12/2011), SCC (squamous cell carcinoma) (08/24/2012), SCC (squamous cell carcinoma) (07/26/2014), Squamous cell carcinoma of skin (05/03/2008), Ulcer, and Vitamin D deficiency. here with:  1.  Myasthenia gravis 2.  Mild gait instability  -Remains stable -Continue CellCept 500 mg daily -Continue Mestinon PRN -Check routine blood work today, will check B 12 at patient request -Encouraged to continue walking exercise -Follow-up in 6 months or sooner if needed, needs labs on Cellcept, ensure stability  I spent 20 minutes of face-to-face and non-face-to-face time with patient.  This included previsit chart review, lab review, study review, order entry, electronic health record documentation, patient education.  Butler Denmark, AGNP-C, DNP 12/03/2020, 11:33 AM [facility] [address], [place], [place] [address] [phone]

## 2020-12-04 LAB — COMPREHENSIVE METABOLIC PANEL
ALT: 14 IU/L (ref 0–44)
AST: 22 IU/L (ref 0–40)
Albumin/Globulin Ratio: 1.5 (ref 1.2–2.2)
Albumin: 3.9 g/dL (ref 3.5–4.6)
Alkaline Phosphatase: 46 IU/L (ref 44–121)
BUN/Creatinine Ratio: 12 (ref 10–24)
BUN: 9 mg/dL — ABNORMAL LOW (ref 10–36)
Bilirubin Total: 0.9 mg/dL (ref 0.0–1.2)
CO2: 25 mmol/L (ref 20–29)
Calcium: 9 mg/dL (ref 8.6–10.2)
Chloride: 97 mmol/L (ref 96–106)
Creatinine, Ser: 0.77 mg/dL (ref 0.76–1.27)
Globulin, Total: 2.6 g/dL (ref 1.5–4.5)
Glucose: 105 mg/dL — ABNORMAL HIGH (ref 65–99)
Potassium: 3.9 mmol/L (ref 3.5–5.2)
Sodium: 136 mmol/L (ref 134–144)
Total Protein: 6.5 g/dL (ref 6.0–8.5)
eGFR: 85 mL/min/{1.73_m2} (ref >59)

## 2020-12-04 LAB — CBC WITH DIFFERENTIAL/PLATELET
Basophils Absolute: 0 10*3/uL (ref 0.0–0.2)
Basos: 1 %
EOS (ABSOLUTE): 0.1 10*3/uL (ref 0.0–0.4)
Eos: 3 %
Hematocrit: 40.6 % (ref 37.5–51.0)
Hemoglobin: 14 g/dL (ref 13.0–17.7)
Immature Grans (Abs): 0 10*3/uL (ref 0.0–0.1)
Immature Granulocytes: 0 %
Lymphocytes Absolute: 1.1 10*3/uL (ref 0.7–3.1)
Lymphs: 26 %
MCH: 32.8 pg (ref 26.6–33.0)
MCHC: 34.5 g/dL (ref 31.5–35.7)
MCV: 95 fL (ref 79–97)
Monocytes Absolute: 0.5 10*3/uL (ref 0.1–0.9)
Monocytes: 12 %
Neutrophils Absolute: 2.4 10*3/uL (ref 1.4–7.0)
Neutrophils: 58 %
Platelets: 239 10*3/uL (ref 150–450)
RBC: 4.27 x10E6/uL (ref 4.14–5.80)
RDW: 11.9 % (ref 11.6–15.4)
WBC: 4.2 10*3/uL (ref 3.4–10.8)

## 2020-12-04 LAB — VITAMIN B12: Vitamin B-12: 1057 pg/mL (ref 232–1245)

## 2020-12-04 NOTE — Progress Notes (Signed)
I have read the note, and I agree with the clinical assessment and plan.  Mackenzy Grumbine K Tavi Gaughran   

## 2020-12-05 ENCOUNTER — Telehealth: Payer: Self-pay

## 2020-12-05 NOTE — Telephone Encounter (Signed)
-----   Message from Suzzanne Cloud, NP sent at 12/04/2020  6:00 AM EDT ----- Please call patient, blood work is unremarkable. B12 level is high normal, likely related to supplement, okay to continue.

## 2020-12-05 NOTE — Telephone Encounter (Signed)
Cm sent to [facility]

## 2020-12-12 ENCOUNTER — Other Ambulatory Visit: Payer: Self-pay

## 2020-12-12 ENCOUNTER — Ambulatory Visit (HOSPITAL_COMMUNITY): Payer: Medicare Other | Attending: Cardiovascular Disease

## 2020-12-12 DIAGNOSIS — R011 Cardiac murmur, unspecified: Secondary | ICD-10-CM | POA: Insufficient documentation

## 2020-12-12 LAB — ECHOCARDIOGRAM COMPLETE
AR max vel: 1.83 cm2
AV Area VTI: 1.93 cm2
AV Area mean vel: 1.98 cm2
AV Mean grad: 14 mmHg
AV Peak grad: 26.2 mmHg
Ao pk vel: 2.56 m/s
Area-P 1/2: 3.72 cm2
S' Lateral: 2.9 cm

## 2020-12-13 DIAGNOSIS — R002 Palpitations: Secondary | ICD-10-CM

## 2020-12-27 DIAGNOSIS — R0781 Pleurodynia: Secondary | ICD-10-CM | POA: Diagnosis not present

## 2020-12-27 DIAGNOSIS — Y92009 Unspecified place in unspecified non-institutional (private) residence as the place of occurrence of the external cause: Secondary | ICD-10-CM | POA: Diagnosis not present

## 2020-12-27 DIAGNOSIS — W07XXXA Fall from chair, initial encounter: Secondary | ICD-10-CM | POA: Diagnosis not present

## 2021-01-02 DIAGNOSIS — R002 Palpitations: Secondary | ICD-10-CM | POA: Diagnosis not present

## 2021-01-04 DIAGNOSIS — R058 Other specified cough: Secondary | ICD-10-CM | POA: Diagnosis not present

## 2021-01-04 DIAGNOSIS — Z1152 Encounter for screening for COVID-19: Secondary | ICD-10-CM | POA: Diagnosis not present

## 2021-01-04 DIAGNOSIS — U071 COVID-19: Secondary | ICD-10-CM | POA: Diagnosis not present

## 2021-01-05 ENCOUNTER — Encounter: Payer: Self-pay | Admitting: Neurology

## 2021-01-05 ENCOUNTER — Telehealth: Payer: Self-pay | Admitting: Neurology

## 2021-01-05 NOTE — Telephone Encounter (Signed)
Patient called after-hours call center regarding information about a new medicine for COVID.  I was able to call the patient back and talk to him personally.  He reports that he went to his primary care to due to cold symptoms and congestion.  He was not feeling well.  He was tested for COVID and was positive.  He was given a prescription for molnupiravir capsules for 5 days.  He wanted to know if it is safe for him to take this given his MG diagnosis.  I advised patient that it is safer to get treated for COVID than the risk of worsening COVID symptoms including breathing related symptoms which could potentially be significantly complicated by his underlying myasthenia gravis.  I looked up the medication, there is no known contraindication as such, it is a new medication and is approved for emergency use by the FDA.  I advised the patient that I would send a message to his primary neurologist, Dr. Jannifer Franklin so he could review as well and weigh in as necessary.  He was advised to start the medication as prescribed.  He demonstrated understanding and agreement.

## 2021-01-06 NOTE — Telephone Encounter (Signed)
I agree with advice given 

## 2021-01-07 ENCOUNTER — Ambulatory Visit: Payer: Medicare Other | Admitting: Dermatology

## 2021-01-14 ENCOUNTER — Telehealth: Payer: Self-pay | Admitting: Cardiology

## 2021-01-14 NOTE — Telephone Encounter (Signed)
Patient's wife is requesting to review the patient's echo and monitor results.

## 2021-01-14 NOTE — Telephone Encounter (Signed)
Echo ordered by PCP-in epic  Monitor to be reviewed by MD.

## 2021-01-16 ENCOUNTER — Ambulatory Visit: Payer: Medicare Other | Admitting: Dermatology

## 2021-01-17 NOTE — Telephone Encounter (Signed)
Called patient-discussed results of echo and monitor with patient and wife, verbalized understanding.

## 2021-01-17 NOTE — Telephone Encounter (Signed)
Echo shows mild aortic stenosis, which is the cause of his heart murmur.  Will monitor.  Heart monitor shows frequent extra beats from the top chamber of the heart.  These are benign.  No significant abnormalities

## 2021-01-17 NOTE — Telephone Encounter (Signed)
Spoke to patient and wife- aware of echo and monitor results.   Monitor results forwarded to PCP per patient request.

## 2021-02-13 ENCOUNTER — Telehealth: Payer: Self-pay | Admitting: Neurology

## 2021-02-13 DIAGNOSIS — G933 Postviral fatigue syndrome: Secondary | ICD-10-CM | POA: Diagnosis not present

## 2021-02-13 DIAGNOSIS — G7000 Myasthenia gravis without (acute) exacerbation: Secondary | ICD-10-CM | POA: Diagnosis not present

## 2021-02-13 DIAGNOSIS — E538 Deficiency of other specified B group vitamins: Secondary | ICD-10-CM | POA: Diagnosis not present

## 2021-02-13 DIAGNOSIS — E441 Mild protein-calorie malnutrition: Secondary | ICD-10-CM | POA: Diagnosis not present

## 2021-02-13 DIAGNOSIS — R2681 Unsteadiness on feet: Secondary | ICD-10-CM | POA: Diagnosis not present

## 2021-02-13 NOTE — Telephone Encounter (Signed)
Pt's wife is asking that pt be seen earlier than Sept due to his PCP advising he be seen by Neurologist because of his issue being Myasthenia gravis related. Please call wife

## 2021-02-14 ENCOUNTER — Encounter: Payer: Self-pay | Admitting: Neurology

## 2021-02-14 NOTE — Telephone Encounter (Signed)
Contact pt wife, per DPR, informing her that we got him scheduled for Monday 02/18/21 at 215. She was grateful. Advised to call if she needs anything else in the meantime.

## 2021-02-14 NOTE — Telephone Encounter (Signed)
I will look into it 

## 2021-02-18 ENCOUNTER — Encounter: Payer: Self-pay | Admitting: Neurology

## 2021-02-18 ENCOUNTER — Ambulatory Visit: Payer: Medicare Other | Admitting: Neurology

## 2021-02-18 VITALS — BP 145/75 | HR 76 | Ht 69.0 in | Wt 165.0 lb

## 2021-02-18 DIAGNOSIS — G7000 Myasthenia gravis without (acute) exacerbation: Secondary | ICD-10-CM

## 2021-02-18 DIAGNOSIS — U099 Post covid-19 condition, unspecified: Secondary | ICD-10-CM | POA: Diagnosis not present

## 2021-02-18 MED ORDER — PREDNISONE 10 MG PO TABS: ORAL_TABLET | ORAL | 0 refills | Status: DC

## 2021-02-18 NOTE — Progress Notes (Signed)
I have read the note, and I agree with the clinical assessment and plan.  Anely Spiewak K Jhordan Mckibben   

## 2021-02-18 NOTE — Progress Notes (Signed)
PATIENT: Victor Sutton DOB: [birth year]  REASON FOR VISIT: follow up HISTORY FROM: patient  HISTORY OF PRESENT ILLNESS: Today 02/18/21 Victor Sutton is a 87 year old history myasthenia gravis with pharyngeal onset. Here for acute visit, concern for worsening MG secondary to COVID diagnosis.  Tested positive for COVID on January 04, 2021, symptoms of sore throat with cough, congestion.  Treated with antiviral molnupiravir for 5 days his symptoms improved.  Prior to Victor Sutton, was noting some fatigue, but was mild.  Since COVID.  Reports no longer able to do his outdoor walking, had worked up to 2 miles a day.  When he gets dressed, has to stop in between to sit down for 15 minutes.  Gets feeling of overwhelming fatigue. Feels short of breath, but has been that way.  Denies any trouble swallowing, but does report decreased appetite.  There is no ptosis, or mouth weakness.  Remains on CellCept 500 mg daily.  Has been off prednisone and Mestinon for several years.  When he last saw his PCP, sodium level was 131, has been drinking Pedialyte, B12, TSH were normal.  Drinking boost twice daily.  Is not feeling much better, fatigue with exertion is major issue. Has been feeling weaving with walking, this was before. At his worst, at time of MG diagnosis, started with fatigue, weakness, then progressed to dysphagia with feeding tube. His wife now feels his voice is weak, mumbled.  Has had recent echocardiogram, heart monitor, this was planned pre-COVID, due to louder heart murmur.  Showed mild aortic stenosis, heart monitor showed no significant abnormalities. No fevers. Here today for evaluation accompanied by his wife.  Update 12/03/2020 SS: Victor Sutton is a 87 year old male with history of myasthenia gravis with pharyngeal onset.  He is on low-dose CellCept and takes Mestinon as needed. Rarely takes Mestinon, just keeps on hand.  Denies any trouble swallowing.  He has gastroparesis, is slow with his eating, doesn't  eat large amounts.  With walking can veer off, this is improved with walking for exercise. He is going on a [place] 1-day trip to [place] in a few weeks.  Seeing cardiology, reported sensation of heart fluttering, having echocardiogram, wearing heart monitor.  No falls.  Requesting B12 checked, is on supplement.  Here today for evaluation accompanied by his wife.  Update 06/04/2020 SS: Victor Sutton is a 87 year old male with history of myasthenia gravis with pharyngeal onset.  He is on low-dose CellCept and takes Mestinon as needed.  He rarely takes Mestinon, only if fullness sensation after meal, has not taken in several months.  Denies double vision or ptosis-he pays attention to this daily.  No trouble swallowing.  He is not very active, enjoys reading. He has noted weaving with walking for several years, has been stable.  He has declining vision, he can drive, but his wife does most driving. Looking forward to cooler weather to get outside walking.  Presents today for evaluation unaccompanied.  HISTORY 11/29/2019 Dr. Jannifer Franklin: Victor Sutton is an 87 year old right-handed white male with a history of myasthenia gravis with pharyngeal onset.  The patient has done well on low-dose CellCept, he is taking the Mestinon only if needed.  He was having stomach upset and diarrhea on the medication.  The patient has not had any significant issues with swallowing or speech changes.  He denies any double vision or ptosis.  He has had a chronic issue with some mild gait instability that he claims has been present since 2002.  His  wife is noted that he tends to weave a little bit when he walks down the hall.  He has not had any falls.  He reports no numbness in the feet.  The patient goes on to say that before he moved here from [place] he was on B12 shots but he has not continued B12 supplementation.  He returns for an evaluation.   REVIEW OF SYSTEMS: Out of a complete 14 system review of symptoms, the patient complains only of the  following symptoms, and all other reviewed systems are negative.  N/A  ALLERGIES: Allergies  Allergen Reactions  . Ciprofloxacin Anaphylaxis    Avoid due to myasthenia gravis  . Quinolones Anaphylaxis    Avoid due to myasthenia gravis    HOME MEDICATIONS: Outpatient Medications Prior to Visit  Medication Sig Dispense Refill  . COVID-19 mRNA Vac-TriS, Pfizer, SUSP injection USE AS DIRECTED .3 mL 0  . hydrochlorothiazide (HYDRODIURIL) 25 MG tablet Take 1 tablet (25 mg total) by mouth daily. 90 tablet 1  . Multiple Vitamins-Minerals (ICAPS) CAPS Take 1 capsule by mouth 2 (two) times daily.    . mupirocin ointment (BACTROBAN) 2 % Apply 1 application topically 2 (two) times daily. 22 g 0  . mycophenolate (CELLCEPT) 500 MG tablet Take 1 tablet (500 mg total) by mouth daily. 90 tablet 3  . pantoprazole (PROTONIX) 40 MG tablet Take 40 mg by mouth daily. May take an additional 40mg s as needed for heartburn    . potassium chloride (KLOR-CON) 10 MEQ tablet TAKE 1 TABLET BY MOUTH ONCE DAILY 90 tablet 3  . Probiotic Product (ALIGN) 4 MG CAPS take 1 po qd    . tamsulosin (FLOMAX) 0.4 MG CAPS capsule 0.4 mg as needed.   3  . UNABLE TO FIND Take 1 tablet by mouth daily. Med Name:[employer] - zeaxanthin 10mg  and lutein 10mg     . Vitamin D, Ergocalciferol, (DRISDOL) 50000 UNITS CAPS capsule Take 50,000 Units by mouth every 7 (seven) days. ON FRIDAYS    . zoledronic acid (RECLAST) 5 MG/100ML SOLN injection Inject 5 mg into the vein once. YEARLY     No facility-administered medications prior to visit.    PAST MEDICAL HISTORY: Past Medical History:  Diagnosis Date  . Adenomatous colon polyp   . B12 deficiency    [facility]  . Basal cell carcinoma 08/18/2006   BASOSQUAMOUS LEFT OUTER FOREHEAD TX CX3 , EXC  . BPH (benign prostatic hyperplasia)   . [place] ulcer   . Cancer ([place])    precancerous skin lesions on leg   . Cataract 2009   bilateral cataract surgery  . Delayed gastric emptying   .  Disturbance of salivary secretion 06/20/2013  . Dysphagia, pharyngoesophageal phase 03/16/2013  . Dysphonia 03/16/2013  . Gait abnormality 11/29/2019  . GERD (gastroesophageal reflux disease)   . Hiatal hernia    hiatial hernia repair  02/08/13  . History of blood transfusion   . Hyperlipidemia    not on medication  . Hypertension   . Iron deficiency anemia   . Leukoplakia 2001   Treated for  . Macular degeneration    (L) wet, (R) dry  . Myasthenia gravis ([place]) 03/21/2013  . Peripheral neuropathy    Mild History of  . Polyneuritis 1965   Resolved  . SCC (squamous cell carcinoma) 03/12/2011   SCC LEFT INNER CHEEK CX3 5FU  . SCC (squamous cell carcinoma) 08/24/2012   RIGHT LOWER SHIN TX WITH BX MOHS  . SCC (squamous cell carcinoma)  07/26/2014   LEFT UNDER CHEEK CX3 5FU  . Squamous cell carcinoma of skin 05/03/2008   SCC INSITU RIGHT V OF CHEST TX EXC  . Ulcer   . Vitamin D deficiency     PAST SURGICAL HISTORY: Past Surgical History:  Procedure Laterality Date  . Arthroscopic knee surgery     left  . cateract surgeries      bilateral  . CHOLECYSTECTOMY    . COLONOSCOPY WITH PROPOFOL N/A 02/16/2017   Procedure: COLONOSCOPY WITH PROPOFOL;  Surgeon: Garlan Fair, MD;  Location: [facility];  Service: Endoscopy;  Laterality: N/A;  . GASTROSTOMY W/ FEEDING TUBE  [id number]  . HERNIA REPAIR     Laparoscopic ventral  . HIATAL HERNIA REPAIR  2004  . HIATAL HERNIA REPAIR N/A 02/08/2013   Procedure:  REPAIR OFCURRENT HIATAL HERNIA, ;  Surgeon: Odis Hollingshead, MD;  Location: [facility];  Service: General;  Laterality: N/A;  . LAPAROSCOPIC LYSIS OF ADHESIONS N/A 02/08/2013   Procedure: LAPAROSCOPIC LYSIS OF ADHESIONS;  Surgeon: Odis Hollingshead, MD;  Location: [facility];  Service: General;  Laterality: N/A;  . LAPAROSCOPIC NISSEN FUNDOPLICATION  7/62/2633   Procedure: LAPAROSCOPIC NISSEN FUNDOPLICATION;  Surgeon: Odis Hollingshead, MD;  Location: [facility];  Service: General;;  . Multiple oral  surgeries  2002  . SKIN CANCER EXCISION  10/2012   right leg-shin area  . STOMACH SURGERY  2005   states his stomach had moved up toward his chest , some type of surgery performed  . TONSILLECTOMY    . UPPER GI ENDOSCOPY N/A 02/08/2013   Procedure: UPPER GI ENDOSCOPY;  Surgeon: Odis Hollingshead, MD;  Location: [facility];  Service: General;  Laterality: N/A;    FAMILY HISTORY: Family History  Problem Relation Age of Onset  . Breast cancer Sister   . CAD Sister   . COPD Sister   . Diabetes Father   . Heart attack Father   . Colon cancer Son 50  . Prostate cancer Paternal Grandfather   . Colon cancer Son 29    SOCIAL HISTORY: Social History   Socioeconomic History  . Marital status: Married    Spouse name: [name]  . Number of children: 3  . Years of education: 67  . Highest education level: Not on file  Occupational History  . Occupation: Retired    Fish farm manager: RETIRED  Tobacco Use  . Smoking status: Former Smoker    Quit date: 09/15/1976    Years since quitting: 44.4  . Smokeless tobacco: Never Used  . Tobacco comment: Quit 1980  Vaping Use  . Vaping Use: Never used  Substance and Sexual Activity  . Alcohol use: No    Comment: Moderate alcohol, wine  . Drug use: No  . Sexual activity: Not on file  Other Topics Concern  . Not on file  Social History Narrative   Lives at home, married   Patient is right-handed.   Patient drinks caffeine occasionally.      Social Determinants of Health   Financial Resource Strain: Not on file  Food Insecurity: Not on file  Transportation Needs: Not on file  Physical Activity: Not on file  Stress: Not on file  Social Connections: Not on file  Intimate Partner Violence: Not on file   PHYSICAL EXAM  Vitals:   02/18/21 1354  BP: (!) 145/75  Pulse: 76  Weight: 165 lb (74.8 kg)  Height: 5\' 9"  (1.753 m)   Body mass index is 24.37 kg/m.  Generalized: Well developed, in no acute distress  Neurological examination  Mentation:  Alert oriented to time, place, history taking. Follows all commands speech and language fluent, voice is soft  Cranial nerve II-XII: Pupils were equal round reactive to light. Extraocular movements were full, visual field were full on confrontational test. Facial sensation and strength were normal.  Head turning and shoulder shrug were normal and symmetric.  No cheek puff or eye closure weakness noted. Motor: The motor testing reveals 5 over 5 strength of all 4 extremities. Good symmetric motor tone is noted throughout. With arms abducted for 1 minute, no significant weakness noted. Sensory: Sensory testing is intact to soft touch on all 4 extremities. No evidence of extinction is noted.  Coordination: Cerebellar testing reveals good finger-nose-finger and heel-to-shin bilaterally.  Gait and station: Can stand from seated position with arms crossed at chest, gait is cautious, wide-based, does tend to drift side to side Reflexes: Deep tendon reflexes are symmetric and normal bilaterally.   DIAGNOSTIC DATA (LABS, IMAGING, TESTING) - I reviewed patient records, labs, notes, testing and imaging myself where available.  Lab Results  Component Value Date   WBC 4.2 12/03/2020   HGB 14.0 12/03/2020   HCT 40.6 12/03/2020   MCV 95 12/03/2020   PLT 239 12/03/2020      Component Value Date/Time   NA 136 12/03/2020 1132   K 3.9 12/03/2020 1132   CL 97 12/03/2020 1132   CO2 25 12/03/2020 1132   GLUCOSE 105 (H) 12/03/2020 1132   GLUCOSE 105 (H) 02/11/2014 1131   BUN 9 (L) 12/03/2020 1132   CREATININE 0.77 12/03/2020 1132   CALCIUM 9.0 12/03/2020 1132   PROT 6.5 12/03/2020 1132   ALBUMIN 3.9 12/03/2020 1132   AST 22 12/03/2020 1132   ALT 14 12/03/2020 1132   ALKPHOS 46 12/03/2020 1132   BILITOT 0.9 12/03/2020 1132   GFRNONAA 78 06/04/2020 0908   GFRAA 90 06/04/2020 0908   Lab Results  Component Value Date   CHOL 118 02/14/2013   TRIG 79 02/21/2013   No results found for: HGBA1C Lab  Results  Component Value Date   VITAMINB12 1,057 12/03/2020   Lab Results  Component Value Date   TSH 2.880 11/29/2019   ASSESSMENT AND PLAN 87 y.o. year old male  has a past medical history of Adenomatous colon polyp, B12 deficiency, Basal cell carcinoma (08/18/2006), BPH (benign prostatic hyperplasia), Lysbeth Galas ulcer, Cancer [facility]), Cataract (2009), Delayed gastric emptying, Disturbance of salivary secretion (06/20/2013), Dysphagia, pharyngoesophageal phase (03/16/2013), Dysphonia (03/16/2013), Gait abnormality (11/29/2019), GERD (gastroesophageal reflux disease), Hiatal hernia, History of blood transfusion, Hyperlipidemia, Hypertension, Iron deficiency anemia, Leukoplakia (2001), Macular degeneration, Myasthenia gravis ([place]) (03/21/2013), Peripheral neuropathy, Polyneuritis (1965), SCC (squamous cell carcinoma) (03/12/2011), SCC (squamous cell carcinoma) (08/24/2012), SCC (squamous cell carcinoma) (07/26/2014), Squamous cell carcinoma of skin (05/03/2008), Ulcer, and Vitamin D deficiency. here with:  1.  Myasthenia gravis 2.  Mild gait instability 3.  Recent COVID  -Symptoms are consistent with fatigueable weakness related to MG -Sodium level was mildly low 131, drinking Pedialyte, recheck coming up, TSH, B12 were normal  -Consider symptoms, age related, post-COVID syndrome, MG exacerbation?  -Given concern for MG exacerbation, will treat with prednisone taper 20 mg daily for 1 week, 15 mg daily for 1 week, 10 mg daily for 1 week, 5 mg daily for 1 week, then stop -Will continue CellCept 500 mg daily -Follow-up in office in 6 to 8 weeks, did consult with Dr. Jannifer Franklin about plan, call for  any worsening symptoms  I spent 45 minutes of face-to-face and non-face-to-face time with patient.  This included previsit chart review, lab review, study review, order entry, discussing MG symptoms, examination, medications, discussion with Dr. Jannifer Franklin about treatment plan.   Butler Denmark, AGNP-C, DNP 02/18/2021, 2:57  PM [facility] [address], [place], [place] [address] [phone]

## 2021-02-18 NOTE — Addendum Note (Signed)
Addended by: Suzzanne Cloud on: 02/18/2021 04:10 PM   Modules accepted: Level of Service

## 2021-02-18 NOTE — Patient Instructions (Signed)
Start taking prednisone 20 mg daily for 1 week, then take 15 mg daily for 1 week, then take 10 mg daily for 1 week, then take 5 mg daily for 1 week, then stop  Keep other medications  See you back in 6-8 weeks

## 2021-02-24 NOTE — Progress Notes (Signed)
Cardiology Office Note:    Date:  02/26/2021   ID:  Victor Sutton, DOB October 26, 1927, MRN [id number]  PCP:  Ginger Organ., MD  Cardiologist:  None  Electrophysiologist:  None   Referring MD: Ginger Organ., MD   No chief complaint on file.   History of Present Illness:    Victor Sutton is a 87 y.o. male with a hx of myasthenia gravis, atrial flutter following abdominal surgery in 2014, BPH, GERD, hyperlipidemia, hypertension who presents for follow-up.  He was referred by Dr. Brigitte Pulse for evaluation of murmur, initially seen on 11/26/2020.  He was seen by cardiology in 2014 after noted to have tachycardia post abdominal surgery.  Adenosine was administered and rhythm was determined to be atrial flutter.  He was not started on anticoagulation.  Echocardiogram 02/09/2013 showed normal biventricular function, moderate LVH, no significant valvular disease.  Labs on 10/23/2020 showed creatinine 0.8, sodium 133, potassium 3.7, albumin 3.7, WBC 4.7, hemoglobin 15.1, LDL 72, TSH 1.7.  He denies any chest pain but reports he has been having shortness of breath.  Occurs with minimal exertion such as cleaning his shower.  He denies any lightheadedness or syncope.  Reports he has had swelling of his legs for years.  Is on hydrochlorothiazide.  Reports he had an episode in February of palpitations where he felt his heart was racing.  Lasted for 3 to 4 minutes.  Smoked 0.5 pdd x 20 years, quit smoking in 1978.  Father had MI at 82.  Sister had CABG in 31s.    Echocardiogram on 12/12/2020 showed normal biventricular function, mild LVH, grade 1 diastolic dysfunction, mild aortic stenosis (mean gradient 14 mmHg, V-max 2.6 m/s, AVA 1.9 cm).  Zio patch x14 days on 01/02/2021 showed frequent PACs (6.4% of beats), occasional PVCs (1.7% of beats), 6 episodes of SVT, longest lasting 14 beats.  Since last clinic visit, he reports that he has been doing okay.  He had COVID-19 infection in April.  Reports had URI  symptoms but did well.  Did not require hospitalization.  Has been having some dyspnea but no chest pain.  Denies any recent palpitations.  Main issue recently has been feeling fatigued.  He was started on a course of prednisone recently for his myasthenia and reports feeling improved.  Past Medical History:  Diagnosis Date   Adenomatous colon polyp    B12 deficiency    HIstory   Basal cell carcinoma 08/18/2006   BASOSQUAMOUS LEFT OUTER FOREHEAD TX CX3 , EXC   BPH (benign prostatic hyperplasia)    Lysbeth Galas ulcer    Cancer [facility])    precancerous skin lesions on leg    Cataract 2009   bilateral cataract surgery   Delayed gastric emptying    Disturbance of salivary secretion 06/20/2013   Dysphagia, pharyngoesophageal phase 03/16/2013   Dysphonia 03/16/2013   Gait abnormality 11/29/2019   GERD (gastroesophageal reflux disease)    Hiatal hernia    hiatial hernia repair  02/08/13   History of blood transfusion    Hyperlipidemia    not on medication   Hypertension    Iron deficiency anemia    Leukoplakia 2001   Treated for   Macular degeneration    (L) wet, (R) dry   Myasthenia gravis ([place]) 03/21/2013   Peripheral neuropathy    Mild History of   Polyneuritis 1965   Resolved   SCC (squamous cell carcinoma) 03/12/2011   SCC LEFT INNER CHEEK CX3 5FU  SCC (squamous cell carcinoma) 08/24/2012   RIGHT LOWER SHIN TX WITH BX MOHS   SCC (squamous cell carcinoma) 07/26/2014   LEFT UNDER CHEEK CX3 5FU   Squamous cell carcinoma of skin 05/03/2008   SCC INSITU RIGHT V OF CHEST TX EXC   Ulcer    Vitamin D deficiency     Past Surgical History:  Procedure Laterality Date   Arthroscopic knee surgery     left   cateract surgeries      bilateral   CHOLECYSTECTOMY     COLONOSCOPY WITH PROPOFOL N/A 02/16/2017   Procedure: COLONOSCOPY WITH PROPOFOL;  Surgeon: Garlan Fair, MD;  Location: [facility] END[facility];  Service: Endoscopy;  Laterality: N/A;   GASTROSTOMY W/ FEEDING TUBE  [id number]   HERNIA  REPAIR     Laparoscopic ventral   HIATAL HERNIA REPAIR  2004   HIATAL HERNIA REPAIR N/A 02/08/2013   Procedure:  REPAIR OFCURRENT HIATAL HERNIA, ;  Surgeon: Odis Hollingshead, MD;  Location: [facility] ORS;  Service: General;  Laterality: N/A;   LAPAROSCOPIC LYSIS OF ADHESIONS N/A 02/08/2013   Procedure: LAPAROSCOPIC LYSIS OF ADHESIONS;  Surgeon: Odis Hollingshead, MD;  Location: [facility] ORS;  Service: General;  Laterality: N/A;   LAPAROSCOPIC NISSEN FUNDOPLICATION  5/46/5681   Procedure: LAPAROSCOPIC NISSEN FUNDOPLICATION;  Surgeon: Odis Hollingshead, MD;  Location: [facility];  Service: General;;   Multiple oral surgeries  2002   SKIN CANCER EXCISION  10/2012   right leg-shin area   STOMACH SURGERY  2005   states his stomach had moved up toward his chest , some type of surgery performed   TONSILLECTOMY     UPPER GI ENDOSCOPY N/A 02/08/2013   Procedure: UPPER GI ENDOSCOPY;  Surgeon: Odis Hollingshead, MD;  Location: [facility];  Service: General;  Laterality: N/A;    Current Medications: Current Meds  Medication Sig   COVID-19 mRNA Vac-TriS, Pfizer, SUSP injection USE AS DIRECTED   hydrochlorothiazide (HYDRODIURIL) 25 MG tablet Take 1 tablet (25 mg total) by mouth daily.   Multiple Vitamins-Minerals (ICAPS) CAPS Take 1 capsule by mouth 2 (two) times daily.   mupirocin ointment (BACTROBAN) 2 % Apply 1 application topically 2 (two) times daily.   mycophenolate (CELLCEPT) 500 MG tablet Take 1 tablet (500 mg total) by mouth daily.   pantoprazole (PROTONIX) 40 MG tablet Take 40 mg by mouth daily. May take an additional 40mg s as needed for heartburn   potassium chloride (KLOR-CON) 10 MEQ tablet TAKE 1 TABLET BY MOUTH ONCE DAILY   predniSONE (DELTASONE) 10 MG tablet Start taking 20 mg daily for 1 week, then take 15 mg daily for 1 week, then take 10 mg daily for 1 week, then take 5 mg daily for 1 week, then stop   Probiotic Product (ALIGN) 4 MG CAPS take 1 po qd   tamsulosin (FLOMAX) 0.4 MG CAPS capsule 0.4 mg as  needed.    UNABLE TO FIND Take 1 tablet by mouth daily. Med Name:EyePromise - zeaxanthin 10mg  and lutein 10mg    Vitamin D, Ergocalciferol, (DRISDOL) 50000 UNITS CAPS capsule Take 50,000 Units by mouth every 7 (seven) days. ON FRIDAYS   zoledronic acid (RECLAST) 5 MG/100ML SOLN injection Inject 5 mg into the vein once. YEARLY     Allergies:   Ciprofloxacin and Quinolones   Social History   Socioeconomic History   Marital status: Married    Spouse name: [name]   Number of children: 3   Years of education: 16   Highest education  level: Not on file  Occupational History   Occupation: Retired    Fish farm manager: RETIRED  Tobacco Use   Smoking status: Former    Pack years: 0.00    Types: Cigarettes    Quit date: 09/15/1976    Years since quitting: 44.4   Smokeless tobacco: Never   Tobacco comments:    Quit 1980  Vaping Use   Vaping Use: Never used  Substance and Sexual Activity   Alcohol use: No    Comment: Moderate alcohol, wine   Drug use: No   Sexual activity: Not on file  Other Topics Concern   Not on file  Social History Narrative   Lives at home, married   Patient is right-handed.   Patient drinks caffeine occasionally.      Social Determinants of Health   Financial Resource Strain: Not on file  Food Insecurity: Not on file  Transportation Needs: Not on file  Physical Activity: Not on file  Stress: Not on file  Social Connections: Not on file     Family History: The patient's family history includes Breast cancer in his sister; CAD in his sister; COPD in his sister; Colon cancer (age of onset: 54) in his son and son; Diabetes in his father; Heart attack in his father; Prostate cancer in his paternal grandfather.  ROS:   Please see the history of present illness.     All other systems reviewed and are negative.  EKGs/Labs/Other Studies Reviewed:    The following studies were reviewed today:   EKG:  EKG is ordered today.  The ekg ordered today demonstrates normal  sinus rhythm, rate 74, no ST/T abnormality  Recent Labs: 12/03/2020: ALT 14; BUN 9; Creatinine, Ser 0.77; Hemoglobin 14.0; Platelets 239; Potassium 3.9; Sodium 136  Recent Lipid Panel    Component Value Date/Time   CHOL 118 02/14/2013 0530   TRIG 79 02/21/2013 0520    Physical Exam:    VS:  BP 140/74   Pulse 78   Ht 5\' 9"  (1.753 m)   Wt 164 lb (74.4 kg)   SpO2 98%   BMI 24.22 kg/m     Wt Readings from Last 3 Encounters:  02/26/21 164 lb (74.4 kg)  02/18/21 165 lb (74.8 kg)  12/03/20 164 lb (74.4 kg)     GEN:  in no acute distress HEENT: Normal NECK: No JVD; No carotid bruits LYMPHATICS: No lymphadenopathy CARDIAC: Normal rate, regular, 2 out of 6 systolic murmur loudest at RUSB RESPIRATORY:  Clear to auscultation without rales, wheezing or rhonchi  ABDOMEN: Soft, non-tender, non-distended MUSCULOSKELETAL:  No edema; No deformity  SKIN: Warm and dry NEUROLOGIC:  Alert and oriented x 3 PSYCHIATRIC:  Normal affect   ASSESSMENT:    1. Atrial flutter, unspecified type ([place])   2. Aortic valve stenosis, etiology of cardiac valve disease unspecified   3. Essential hypertension     PLAN:    Aortic stenosis: Mild AS on echo 12/12/2020.  Will monitor  Atrial flutter: Occurred following hernia surgery in 2014, no evidence of recurrence.  CHA2DS2-VASc score 3 (hypertension, age x2) but was not been started on anticoagulation.  Has had no known recurrence of atrial flutter but reports recent episode of palpitations.  Zio patch x14 days on 01/02/2021 showed frequent PACs (6.4% of beats), occasional PVCs (1.7% of beats), 6 episodes of SVT, longest lasting 14 beats.  Did not report palpitations while wearing the monitor.  Recommend considering Kardia mobile device for more long-term monitoring  Hypertension: On  hydrochlorothiazide 25 mg daily.  Reports had recent labs with PCP that showed hyponatremia.  Reports has repeat labs with PCP later this week.  If still hyponatremic, suspect  due to HCTZ, would recommend alternative antihypertensive   Myasthenia gravis: On CellCept  RTC in 1 year  Medication Adjustments/Labs and Tests Ordered: Current medicines are reviewed at length with the patient today.  Concerns regarding medicines are outlined above.  No orders of the defined types were placed in this encounter.  No orders of the defined types were placed in this encounter.   Patient Instructions  Medication Instructions:  Your physician recommends that you continue on your current medications as directed. Please refer to the Current Medication list given to you today.  *If you need a refill on your cardiac medications before your next appointment, please call your pharmacy*  Follow-Up: At [facility], you and your health needs are our priority.  As part of our continuing mission to provide you with exceptional heart care, we have created designated Provider Care Teams.  These Care Teams include your primary Cardiologist (physician) and Advanced Practice Providers (APPs -  Physician Assistants and Nurse Practitioners) who all work together to provide you with the care you need, when you need it.  We recommend signing up for the patient portal called "MyChart".  Sign up information is provided on this After Visit Summary.  MyChart is used to connect with patients for Virtual Visits (Telemedicine).  Patients are able to view lab/test results, encounter notes, upcoming appointments, etc.  Non-urgent messages can be sent to your provider as well.   To learn more about what you can do with MyChart, go to [email or web address].    Your next appointment:   12 month(s)  The format for your next appointment:   In Person  Provider:   Oswaldo Milian, MD    Recommend: Brantley Persons by [employer] for home EKG monitoring   Signed, Donato Heinz, MD  02/26/2021 9:45 AM    [place]

## 2021-02-26 ENCOUNTER — Ambulatory Visit: Payer: Medicare Other | Admitting: Cardiology

## 2021-02-26 ENCOUNTER — Encounter: Payer: Self-pay | Admitting: Cardiology

## 2021-02-26 ENCOUNTER — Other Ambulatory Visit: Payer: Self-pay

## 2021-02-26 VITALS — BP 140/74 | HR 78 | Ht 69.0 in | Wt 164.0 lb

## 2021-02-26 DIAGNOSIS — I10 Essential (primary) hypertension: Secondary | ICD-10-CM | POA: Diagnosis not present

## 2021-02-26 DIAGNOSIS — I4892 Unspecified atrial flutter: Secondary | ICD-10-CM

## 2021-02-26 DIAGNOSIS — I350 Nonrheumatic aortic (valve) stenosis: Secondary | ICD-10-CM

## 2021-02-26 NOTE — Patient Instructions (Signed)
Medication Instructions:  Your physician recommends that you continue on your current medications as directed. Please refer to the Current Medication list given to you today.  *If you need a refill on your cardiac medications before your next appointment, please call your pharmacy*  Follow-Up: At [facility], you and your health needs are our priority.  As part of our continuing mission to provide you with exceptional heart care, we have created designated Provider Care Teams.  These Care Teams include your primary Cardiologist (physician) and Advanced Practice Providers (APPs -  Physician Assistants and Nurse Practitioners) who all work together to provide you with the care you need, when you need it.  We recommend signing up for the patient portal called "MyChart".  Sign up information is provided on this After Visit Summary.  MyChart is used to connect with patients for Virtual Visits (Telemedicine).  Patients are able to view lab/test results, encounter notes, upcoming appointments, etc.  Non-urgent messages can be sent to your provider as well.   To learn more about what you can do with MyChart, go to [email or web address].    Your next appointment:   12 month(s)  The format for your next appointment:   In Person  Provider:   Oswaldo Milian, MD    Recommend: [facility] Persons by [employer] for home EKG monitoring

## 2021-02-28 DIAGNOSIS — G933 Postviral fatigue syndrome: Secondary | ICD-10-CM | POA: Diagnosis not present

## 2021-03-12 ENCOUNTER — Other Ambulatory Visit: Payer: Self-pay

## 2021-03-12 ENCOUNTER — Ambulatory Visit: Payer: Medicare Other | Admitting: Dermatology

## 2021-03-12 ENCOUNTER — Encounter: Payer: Self-pay | Admitting: Dermatology

## 2021-03-12 DIAGNOSIS — L821 Other seborrheic keratosis: Secondary | ICD-10-CM

## 2021-03-12 DIAGNOSIS — C44329 Squamous cell carcinoma of skin of other parts of face: Secondary | ICD-10-CM | POA: Diagnosis not present

## 2021-03-12 DIAGNOSIS — D485 Neoplasm of uncertain behavior of skin: Secondary | ICD-10-CM

## 2021-03-12 DIAGNOSIS — L570 Actinic keratosis: Secondary | ICD-10-CM

## 2021-03-12 DIAGNOSIS — L820 Inflamed seborrheic keratosis: Secondary | ICD-10-CM

## 2021-03-12 DIAGNOSIS — D1801 Hemangioma of skin and subcutaneous tissue: Secondary | ICD-10-CM

## 2021-03-12 NOTE — Patient Instructions (Addendum)

## 2021-03-14 ENCOUNTER — Other Ambulatory Visit: Payer: Self-pay | Admitting: Neurology

## 2021-03-20 ENCOUNTER — Telehealth: Payer: Self-pay

## 2021-03-20 NOTE — Telephone Encounter (Signed)
PATH TO PATIENT SURGERY MADE

## 2021-03-20 NOTE — Telephone Encounter (Signed)
-----   Message from Lavonna Monarch, MD sent at 03/20/2021  6:39 AM EDT ----- Schedule surgery with Dr. Darene Lamer

## 2021-03-30 ENCOUNTER — Encounter: Payer: Self-pay | Admitting: Dermatology

## 2021-03-30 NOTE — Progress Notes (Signed)
   Follow-Up Visit   Subjective  Victor Sutton is a 87 y.o. male who presents for the following: Skin Problem (Patient here today for lesion check on right temple x several weeks growing per patient's wife. Per patient's wife he has a lesion on his back that is bleeding so they would like the patient's back checked. And recheck lesion below right ear, per patient it's been looked at before. ).  Multiple growths to check, most recently growing 148. Location:  Duration:  Quality:  Associated Signs/Symptoms: Modifying Factors:  Severity:  Timing: Context:   Objective  Well appearing patient in no apparent distress; mood and affect are within normal limits. Right Temple 2cm to scar papule hornlike verrucous 5 mm     Right Upper Back Pink focally eroded 6 mm crust contiguous with 1 cm pink-brown seborrheic keratosis.     Mid Back Multiple 5 to 15 mm textured brown flattopped papules  Left Flank Multiple 1 to 2 mm smooth red dermal papules, Several darker purple.  All with normal dermoscopy.  Head - Anterior (Face), Left Lower Back, Neck - Anterior Multiple pink 3 to 5 mm hornlike crusts    All skin waist up examined.   Assessment & Plan    Neoplasm of uncertain behavior of skin (2) Right Temple  Skin / nail biopsy Type of biopsy: tangential   Informed consent: discussed and consent obtained   Timeout: patient name, date of birth, surgical site, and procedure verified   Anesthesia: the lesion was anesthetized in a standard fashion   Anesthetic:  1% lidocaine w/ epinephrine 1-100,000 local infiltration Instrument used: flexible razor blade   Hemostasis achieved with: aluminum chloride, ferric subsulfate and electrodesiccation   Outcome: patient tolerated procedure well   Post-procedure details: sterile dressing applied and wound care instructions given   Dressing type: bandage and petrolatum    Specimen 1 - Surgical pathology Differential Diagnosis: ka bcc  scc +mohs Check Margins: No  Right Upper Back  Skin / nail biopsy Type of biopsy: tangential   Informed consent: discussed and consent obtained   Timeout: patient name, date of birth, surgical site, and procedure verified   Anesthesia: the lesion was anesthetized in a standard fashion   Anesthetic:  1% lidocaine w/ epinephrine 1-100,000 local infiltration Instrument used: flexible razor blade   Hemostasis achieved with: aluminum chloride and electrodesiccation   Outcome: patient tolerated procedure well   Post-procedure details: wound care instructions given    Specimen 2 - Surgical pathology Differential Diagnosis: isk  Check Margins: No  Seborrheic keratosis Mid Back  Leave if stable  Cherry angioma Left Flank  AK (actinic keratosis) (3) Head - Anterior (Face); Neck - Anterior; Left Lower Back  Destruction of lesion - Head - Anterior (Face), Left Lower Back, Neck - Anterior Complexity: simple   Destruction method: cryotherapy   Informed consent: discussed and consent obtained   Lesion destroyed using liquid nitrogen: Yes   Cryotherapy cycles:  3 Outcome: patient tolerated procedure well with no complications        I, Lavonna Monarch, MD, have reviewed all documentation for this visit.  The documentation on 03/30/21 for the exam, diagnosis, procedures, and orders are all accurate and complete.

## 2021-04-03 NOTE — Progress Notes (Signed)
PATIENT: Victor Sutton DOB: [birth year]  REASON FOR VISIT: follow up HISTORY FROM: patient Primary Neurologist: Dr. Jannifer Franklin  HISTORY OF PRESENT ILLNESS: Today 04/04/21 Victor Sutton here today for follow-up post possible MG exacerbation post-COVID seen in early June.  Started on prednisone taper. Finished prednisone July 4th. Saw Dr. Brigitte Pulse sodium level is now normal. Felt great on prednisone 20 mg daily. Is taking boost with protein, drinking Pedialyte. Is on potassium, has been low with similar symptoms, on B 12 supplement. He isn't doing outdoor walking right now, mostly because of the heat, busy life. Hasn't gone back to indoor track since [place]. No trouble swallowing or chewing. Feels he would be better with more energy if back to his walking. Remains on Cellcept 500 mg daily. Isn't interested in starting back on prednisone daily.   Update 02/18/21 SS: Victor Sutton is a 87 year old history myasthenia gravis with pharyngeal onset. Here for acute visit, concern for worsening MG secondary to COVID diagnosis.  Tested positive for COVID on January 04, 2021, symptoms of sore throat with cough, congestion.  Treated with antiviral molnupiravir for 5 days his symptoms improved.  Prior to [place], was noting some fatigue, but was mild.  Since COVID.  Reports no longer able to do his outdoor walking, had worked up to 2 miles a day.  When he gets dressed, has to stop in between to sit down for 15 minutes.  Gets feeling of overwhelming fatigue. Feels short of breath, but has been that way.  Denies any trouble swallowing, but does report decreased appetite.  There is no ptosis, or mouth weakness.  Remains on CellCept 500 mg daily.  Has been off prednisone and Mestinon for several years.  When he last saw his PCP, sodium level was 131, has been drinking Pedialyte, B12, TSH were normal.  Drinking boost twice daily.  Is not feeling much better, fatigue with exertion is major issue. Has been feeling weaving with  walking, this was before. At his worst, at time of MG diagnosis, started with fatigue, weakness, then progressed to dysphagia with feeding tube. His wife now feels his voice is weak, mumbled.  Has had recent echocardiogram, heart monitor, this was planned pre-COVID, due to louder heart murmur.  Showed mild aortic stenosis, heart monitor showed no significant abnormalities. No fevers. Here today for evaluation accompanied by his wife.  Update 12/03/2020 SS: Victor Sutton is a 87 year old male with history of myasthenia gravis with pharyngeal onset.  He is on low-dose CellCept and takes Mestinon as needed. Rarely takes Mestinon, just keeps on hand.  Denies any trouble swallowing.  He has gastroparesis, is slow with his eating, doesn't eat large amounts.  With walking can veer off, this is improved with walking for exercise. He is going on a [place] 1-day trip to [place] in a few weeks.  Seeing cardiology, reported sensation of heart fluttering, having echocardiogram, wearing heart monitor.  No falls.  Requesting B12 checked, is on supplement.  Here today for evaluation accompanied by his wife.  Update 06/04/2020 SS: Victor Sutton is a 87 year old male with history of myasthenia gravis with pharyngeal onset.  He is on low-dose CellCept and takes Mestinon as needed.  He rarely takes Mestinon, only if fullness sensation after meal, has not taken in several months.  Denies double vision or ptosis-he pays attention to this daily.  No trouble swallowing.  He is not very active, enjoys reading. He has noted weaving with walking for several years, has been stable.  He has declining vision, he can drive, but his wife does most driving. Looking forward to cooler weather to get outside walking.  Presents today for evaluation unaccompanied.  HISTORY 11/29/2019 Dr. Jannifer Franklin: Victor Sutton is an 87 year old right-handed white male with a history of myasthenia gravis with pharyngeal onset.  The patient has done well on low-dose CellCept, he is  taking the Mestinon only if needed.  He was having stomach upset and diarrhea on the medication.  The patient has not had any significant issues with swallowing or speech changes.  He denies any double vision or ptosis.  He has had a chronic issue with some mild gait instability that he claims has been present since 2002.  His wife is noted that he tends to weave a little bit when he walks down the hall.  He has not had any falls.  He reports no numbness in the feet.  The patient goes on to say that before he moved here from [place] he was on B12 shots but he has not continued B12 supplementation.  He returns for an evaluation.   REVIEW OF SYSTEMS: Out of a complete 14 system review of symptoms, the patient complains only of the following symptoms, and all other reviewed systems are negative.  See HPI  ALLERGIES: Allergies  Allergen Reactions   Ciprofloxacin Anaphylaxis    Avoid due to myasthenia gravis   Quinolones Anaphylaxis    Avoid due to myasthenia gravis    HOME MEDICATIONS: Outpatient Medications Prior to Visit  Medication Sig Dispense Refill   COVID-19 mRNA Vac-TriS, Pfizer, SUSP injection USE AS DIRECTED .3 mL 0   hydrochlorothiazide (HYDRODIURIL) 25 MG tablet Take 1 tablet (25 mg total) by mouth daily. 90 tablet 1   Multiple Vitamins-Minerals (ICAPS) CAPS Take 1 capsule by mouth 2 (two) times daily.     mupirocin ointment (BACTROBAN) 2 % Apply 1 application topically 2 (two) times daily. 22 g 0   mycophenolate (CELLCEPT) 500 MG tablet Take 1 tablet (500 mg total) by mouth daily. 90 tablet 3   pantoprazole (PROTONIX) 40 MG tablet Take 40 mg by mouth daily. May take an additional 40mg s as needed for heartburn     potassium chloride (KLOR-CON) 10 MEQ tablet TAKE 1 TABLET BY MOUTH ONCE DAILY 90 tablet 3   predniSONE (DELTASONE) 10 MG tablet Start taking 20 mg daily for 1 week, then take 15 mg daily for 1 week, then take 10 mg daily for 1 week, then take 5 mg daily for 1 week, then  stop 40 tablet 0   Probiotic Product (ALIGN) 4 MG CAPS take 1 po qd     tamsulosin (FLOMAX) 0.4 MG CAPS capsule 0.4 mg as needed.   3   UNABLE TO FIND Take 1 tablet by mouth daily. Med Name:EyePromise - zeaxanthin 10mg  and lutein 10mg      Vitamin D, Ergocalciferol, (DRISDOL) 50000 UNITS CAPS capsule Take 50,000 Units by mouth every 7 (seven) days. ON FRIDAYS     zoledronic acid (RECLAST) 5 MG/100ML SOLN injection Inject 5 mg into the vein once. YEARLY     No facility-administered medications prior to visit.    PAST MEDICAL HISTORY: Past Medical History:  Diagnosis Date   Adenomatous colon polyp    B12 deficiency    HIstory   Basal cell carcinoma 08/18/2006   BASOSQUAMOUS LEFT OUTER FOREHEAD TX CX3 , EXC   BPH (benign prostatic hyperplasia)    [place] ulcer    Cancer ([facility])  precancerous skin lesions on leg    Cataract 2009   bilateral cataract surgery   Delayed gastric emptying    Disturbance of salivary secretion 06/20/2013   Dysphagia, pharyngoesophageal phase 03/16/2013   Dysphonia 03/16/2013   Gait abnormality 11/29/2019   GERD (gastroesophageal reflux disease)    Hiatal hernia    hiatial hernia repair  02/08/13   History of blood transfusion    Hyperlipidemia    not on medication   Hypertension    Iron deficiency anemia    Leukoplakia 2001   Treated for   Macular degeneration    (L) wet, (R) dry   Myasthenia gravis ([place]) 03/21/2013   Peripheral neuropathy    Mild History of   Polyneuritis 1965   Resolved   SCC (squamous cell carcinoma) 03/12/2011   SCC LEFT INNER CHEEK CX3 5FU   SCC (squamous cell carcinoma) 08/24/2012   RIGHT LOWER SHIN TX WITH BX MOHS   SCC (squamous cell carcinoma) 07/26/2014   LEFT UNDER CHEEK CX3 5FU   Squamous cell carcinoma of skin 05/03/2008   SCC INSITU RIGHT V OF CHEST TX EXC   Ulcer    Vitamin D deficiency     PAST SURGICAL HISTORY: Past Surgical History:  Procedure Laterality Date   Arthroscopic knee surgery     left   cateract  surgeries      bilateral   CHOLECYSTECTOMY     COLONOSCOPY WITH PROPOFOL N/A 02/16/2017   Procedure: COLONOSCOPY WITH PROPOFOL;  Surgeon: Garlan Fair, MD;  Location: [facility];  Service: Endoscopy;  Laterality: N/A;   GASTROSTOMY W/ FEEDING TUBE  [id number]   HERNIA REPAIR     Laparoscopic ventral   HIATAL HERNIA REPAIR  2004   HIATAL HERNIA REPAIR N/A 02/08/2013   Procedure:  REPAIR OFCURRENT HIATAL HERNIA, ;  Surgeon: Odis Hollingshead, MD;  Location: [facility];  Service: General;  Laterality: N/A;   LAPAROSCOPIC LYSIS OF ADHESIONS N/A 02/08/2013   Procedure: LAPAROSCOPIC LYSIS OF ADHESIONS;  Surgeon: Odis Hollingshead, MD;  Location: [facility];  Service: General;  Laterality: N/A;   LAPAROSCOPIC NISSEN FUNDOPLICATION  3/53/2992   Procedure: LAPAROSCOPIC NISSEN FUNDOPLICATION;  Surgeon: Odis Hollingshead, MD;  Location: [facility];  Service: General;;   Multiple oral surgeries  2002   SKIN CANCER EXCISION  10/2012   right leg-shin area   STOMACH SURGERY  2005   states his stomach had moved up toward his chest , some type of surgery performed   TONSILLECTOMY     UPPER GI ENDOSCOPY N/A 02/08/2013   Procedure: UPPER GI ENDOSCOPY;  Surgeon: Odis Hollingshead, MD;  Location: [facility];  Service: General;  Laterality: N/A;    FAMILY HISTORY: Family History  Problem Relation Age of Onset   Breast cancer Sister    CAD Sister    COPD Sister    Diabetes Father    Heart attack Father    Colon cancer Son 14   Prostate cancer Paternal Grandfather    Colon cancer Son 89    SOCIAL HISTORY: Social History   Socioeconomic History   Marital status: Married    Spouse name: [name]   Number of children: 3   Years of education: 16   Highest education level: Not on file  Occupational History   Occupation: Retired    Fish farm manager: RETIRED  Tobacco Use   Smoking status: Former    Types: Cigarettes    Quit date: 09/15/1976    Years since quitting: 48.5  Smokeless tobacco: Never   Tobacco comments:     Quit 1980  Vaping Use   Vaping Use: Never used  Substance and Sexual Activity   Alcohol use: No    Comment: Moderate alcohol, wine   Drug use: No   Sexual activity: Not on file  Other Topics Concern   Not on file  Social History Narrative   Lives at home, married   Patient is right-handed.   Patient drinks caffeine occasionally.      Social Determinants of Health   Financial Resource Strain: Not on file  Food Insecurity: Not on file  Transportation Needs: Not on file  Physical Activity: Not on file  Stress: Not on file  Social Connections: Not on file  Intimate Partner Violence: Not on file   PHYSICAL EXAM  Vitals:   04/04/21 1408  BP: (!) 145/77  Pulse: (!) 101  Weight: 167 lb (75.8 kg)  Height: 5\' 9"  (1.753 m)    Body mass index is 24.66 kg/m.  Generalized: Well developed, in no acute distress  Neurological examination  Mentation: Alert oriented to time, place, history taking. Follows all commands speech and language fluent Cranial nerve II-XII: Pupils were equal round reactive to light. Extraocular movements were full, visual field were full on confrontational test. Facial sensation and strength were normal.  Head turning and shoulder shrug were normal and symmetric.  No cheek puff or eye closure weakness noted. Motor: The motor testing reveals 5 over 5 strength of all 4 extremities. Good symmetric motor tone is noted throughout. With arms abducted for 1 minute, no significant weakness noted. Sensory: Sensory testing is intact to soft touch on all 4 extremities. No evidence of extinction is noted.  Coordination: Cerebellar testing reveals good finger-nose-finger and heel-to-shin bilaterally.  Gait and station: Has to rock once to stand with arms crossed at chest gait is steady, good strides, independent  Reflexes: Deep tendon reflexes are symmetric and normal bilaterally.   DIAGNOSTIC DATA (LABS, IMAGING, TESTING) - I reviewed patient records, labs, notes,  testing and imaging myself where available.  Lab Results  Component Value Date   WBC 4.2 12/03/2020   HGB 14.0 12/03/2020   HCT 40.6 12/03/2020   MCV 95 12/03/2020   PLT 239 12/03/2020      Component Value Date/Time   NA 136 12/03/2020 1132   K 3.9 12/03/2020 1132   CL 97 12/03/2020 1132   CO2 25 12/03/2020 1132   GLUCOSE 105 (H) 12/03/2020 1132   GLUCOSE 105 (H) 02/11/2014 1131   BUN 9 (L) 12/03/2020 1132   CREATININE 0.77 12/03/2020 1132   CALCIUM 9.0 12/03/2020 1132   PROT 6.5 12/03/2020 1132   ALBUMIN 3.9 12/03/2020 1132   AST 22 12/03/2020 1132   ALT 14 12/03/2020 1132   ALKPHOS 46 12/03/2020 1132   BILITOT 0.9 12/03/2020 1132   GFRNONAA 78 06/04/2020 0908   GFRAA 90 06/04/2020 0908   Lab Results  Component Value Date   CHOL 118 02/14/2013   TRIG 79 02/21/2013   No results found for: HGBA1C Lab Results  Component Value Date   VITAMINB12 1,057 12/03/2020   Lab Results  Component Value Date   TSH 2.880 11/29/2019   ASSESSMENT AND PLAN 87 y.o. year old male  has a past medical history of Adenomatous colon polyp, B12 deficiency, Basal cell carcinoma (08/18/2006), BPH (benign prostatic hyperplasia), Lysbeth Galas ulcer, Cancer [facility]), Cataract (2009), Delayed gastric emptying, Disturbance of salivary secretion (06/20/2013), Dysphagia, pharyngoesophageal phase (03/16/2013), Dysphonia (03/16/2013),  Gait abnormality (11/29/2019), GERD (gastroesophageal reflux disease), Hiatal hernia, History of blood transfusion, Hyperlipidemia, Hypertension, Iron deficiency anemia, Leukoplakia (2001), Macular degeneration, Myasthenia gravis ([place]) (03/21/2013), Peripheral neuropathy, Polyneuritis (1965), SCC (squamous cell carcinoma) (03/12/2011), SCC (squamous cell carcinoma) (08/24/2012), SCC (squamous cell carcinoma) (07/26/2014), Squamous cell carcinoma of skin (05/03/2008), Ulcer, and Vitamin D deficiency. here with:  1.  Myasthenia gravis 2.  Mild gait instability 3.  Recent COVID  -Finished  prednisone taper, felt great on prednisone, fatigue has come back, but is some better, not as much with daily activity, but any exertion of physical labor makes him fatigued, wants to lay down  -Not clear this is MG related, could be combination, along with post-COVID, deconditioning, age-related -Will continue CellCept at current dosing, he has Mestinon to take if needed, we will not add back in a daily prednisone dose -He will slowly get back started on his walking, exercise, given the high heat, recommend indoor walking -Closely monitor for exacerbation of symptoms, has a follow-up appointment in September, will keep this, after this appointment, will discuss his next appointment seeing a new neurologist since Dr. Jannifer Franklin is retiring -PCP has checked labs, is on boost, B12 supplement, K +supplement   I spent 32 minutes of face-to-face and non-face-to-face time with patient.  This included previsit chart review, lab review, study review, order entry, electronic health record documentation, patient education.  Butler Denmark, AGNP-C, DNP 04/04/2021, 2:29 PM [facility] [address] [place], [place] [address] [phone]

## 2021-04-04 ENCOUNTER — Encounter: Payer: Self-pay | Admitting: Neurology

## 2021-04-04 ENCOUNTER — Other Ambulatory Visit: Payer: Self-pay

## 2021-04-04 ENCOUNTER — Ambulatory Visit: Payer: Medicare Other | Admitting: Neurology

## 2021-04-04 VITALS — BP 145/77 | HR 101 | Ht 69.0 in | Wt 167.0 lb

## 2021-04-04 DIAGNOSIS — U099 Post covid-19 condition, unspecified: Secondary | ICD-10-CM | POA: Diagnosis not present

## 2021-04-04 DIAGNOSIS — G7000 Myasthenia gravis without (acute) exacerbation: Secondary | ICD-10-CM

## 2021-04-04 NOTE — Patient Instructions (Signed)
Get back into exercise slowly to improve the fatigue Will not adjust any medications today  Keep appointment in September

## 2021-04-04 NOTE — Progress Notes (Signed)
I have read the note, and I agree with the clinical assessment and plan.  Caeden Foots K Isa Kohlenberg   

## 2021-04-16 DIAGNOSIS — H524 Presbyopia: Secondary | ICD-10-CM | POA: Diagnosis not present

## 2021-05-23 ENCOUNTER — Ambulatory Visit (INDEPENDENT_AMBULATORY_CARE_PROVIDER_SITE_OTHER): Payer: Medicare Other | Admitting: Dermatology

## 2021-05-23 ENCOUNTER — Encounter: Payer: Self-pay | Admitting: Dermatology

## 2021-05-23 ENCOUNTER — Other Ambulatory Visit: Payer: Self-pay

## 2021-05-23 DIAGNOSIS — C4442 Squamous cell carcinoma of skin of scalp and neck: Secondary | ICD-10-CM

## 2021-05-23 DIAGNOSIS — C44329 Squamous cell carcinoma of skin of other parts of face: Secondary | ICD-10-CM | POA: Diagnosis not present

## 2021-05-23 DIAGNOSIS — C4492 Squamous cell carcinoma of skin, unspecified: Secondary | ICD-10-CM

## 2021-05-23 DIAGNOSIS — D485 Neoplasm of uncertain behavior of skin: Secondary | ICD-10-CM

## 2021-05-23 DIAGNOSIS — L570 Actinic keratosis: Secondary | ICD-10-CM

## 2021-05-23 NOTE — Patient Instructions (Signed)

## 2021-05-31 ENCOUNTER — Encounter: Payer: Self-pay | Admitting: Dermatology

## 2021-05-31 NOTE — Progress Notes (Signed)
Follow-Up Visit   Subjective  Victor Sutton is a 87 y.o. male who presents for the following: Procedure (Here for treatment right temple well diff SCC.).  Biopsy proven skin cancer right temple plus growing nodule below right ear Location:  Duration:  Quality:  Associated Signs/Symptoms: Modifying Factors:  Severity:  Timing: Context:   Objective  Well appearing patient in no apparent distress; mood and affect are within normal limits. Right Temple Biopsy site identified by nurse, patient, and me.  Spouse in room with patient throughout visit.  Left Nasal Sidewall, Mid Frontal Scalp (3) Half dozen gritty 3 to 5 mm pink crusts     Right Posterior Neck Centrally eroded 9 mm waxy pink crust         A focused examination was performed including head and neck. Relevant physical exam findings are noted in the Assessment and Plan.   Assessment & Plan    SCC (squamous cell carcinoma) Right Temple  Destruction of lesion Complexity: simple   Destruction method: cryotherapy   Informed consent: discussed and consent obtained   Timeout:  patient name, date of birth, surgical site, and procedure verified Lesion destroyed using liquid nitrogen: Yes   Cryotherapy cycles:  3 Lesion length (cm):  1.4 Lesion width (cm):  1.4 Margin per side (cm):  0 Final wound size (cm):  1.4 Outcome: patient tolerated procedure well with no complications    AK (actinic keratosis) (4) Mid Frontal Scalp (3); Left Nasal Sidewall  Destruction of lesion - Left Nasal Sidewall, Mid Frontal Scalp Complexity: simple   Destruction method: cryotherapy   Informed consent: discussed and consent obtained   Timeout:  patient name, date of birth, surgical site, and procedure verified Lesion destroyed using liquid nitrogen: Yes   Cryotherapy cycles:  3 Outcome: patient tolerated procedure well with no complications    SCC (squamous cell carcinoma), scalp/neck Right Posterior Neck  Skin /  nail biopsy Type of biopsy: tangential   Informed consent: discussed and consent obtained   Timeout: patient name, date of birth, surgical site, and procedure verified   Procedure prep:  Patient was prepped and draped in usual sterile fashion (Non sterile) Prep type:  Chlorhexidine Anesthesia: the lesion was anesthetized in a standard fashion   Anesthetic:  1% lidocaine w/ epinephrine 1-100,000 local infiltration Instrument used: flexible razor blade   Outcome: patient tolerated procedure well   Post-procedure details: wound care instructions given    Destruction of lesion Complexity: simple   Destruction method: electrodesiccation and curettage   Informed consent: discussed and consent obtained   Timeout:  patient name, date of birth, surgical site, and procedure verified Anesthesia: the lesion was anesthetized in a standard fashion   Anesthetic:  1% lidocaine w/ epinephrine 1-100,000 local infiltration Curettage performed in three different directions: Yes   Electrodesiccation performed over the curetted area: Yes   Curettage cycles:  3 Lesion length (cm):  1 Lesion width (cm):  1 Margin per side (cm):  0 Final wound size (cm):  1 Hemostasis achieved with:  aluminum chloride Outcome: patient tolerated procedure well with no complications   Post-procedure details: wound care instructions given    Specimen 1 - Surgical pathology Differential Diagnosis: bcc vs [id number]  Check Margins: No  After shave biopsy the base of the lesion was curetted, cauterized, and inoculated with parenteral 5-FU.      I, Lavonna Monarch, MD, have reviewed all documentation for this visit.  The documentation on 05/31/21 for the exam, diagnosis, procedures,  and orders are all accurate and complete.

## 2021-06-04 ENCOUNTER — Telehealth: Payer: Self-pay | Admitting: Dermatology

## 2021-06-04 NOTE — Telephone Encounter (Signed)
Pathology to patient wife [name]. Patient already has follow up appointment scheduled.

## 2021-06-04 NOTE — Telephone Encounter (Signed)
Patient is calling for pathology results from last visit with Victor Sutton, M.D.  Also, patient says to give his regards to Dr. Denna Haggard.

## 2021-06-05 ENCOUNTER — Other Ambulatory Visit: Payer: Self-pay

## 2021-06-05 ENCOUNTER — Encounter: Payer: Self-pay | Admitting: Adult Health

## 2021-06-05 ENCOUNTER — Ambulatory Visit: Payer: Medicare Other | Admitting: Adult Health

## 2021-06-05 VITALS — BP 116/74 | HR 80 | Ht 69.0 in | Wt 164.8 lb

## 2021-06-05 DIAGNOSIS — G7000 Myasthenia gravis without (acute) exacerbation: Secondary | ICD-10-CM

## 2021-06-05 MED ORDER — PYRIDOSTIGMINE BROMIDE 60 MG PO TABS: 30.0000 mg | ORAL_TABLET | Freq: Two times a day (BID) | ORAL | 5 refills | Status: DC | PRN

## 2021-06-05 NOTE — Progress Notes (Signed)
I have read the note, and I agree with the clinical assessment and plan.  Macallan Ord K Yvonda Fouty   

## 2021-06-05 NOTE — Progress Notes (Signed)
PATIENT: Victor Sutton DOB: 1928/05/02  REASON FOR VISIT: follow up HISTORY FROM: patient PRIMARY NEUROLOGIST: Dr. Henderson Baltimore, NP  HISTORY OF PRESENT ILLNESS: Today 06/05/21:  Victor Sutton is a 87 year old male with a history of myasthenia gravis.  He returns today for follow-up.  The patient's main complaint is fatigue.  Reports that he is fatigued all the time.  He states that there are times that he feels that he cannot lift his arms or legs.  This is not consistent.  Denies any trouble chewing or swallowing food.  He does walk approximately 3 times a week on a track 1 mile.  He does not use an assistive device when ambulating but he does have a railing if he needs to hold onto something.  The patient was on Mestinon in the past but it caused upset stomach.  The patient states that he felt the best when he was on prednisone-particularly when he did the prednisone taper pack.  He was on daily prednisone in the past but due to osteoporosis he was taken off of this medication.  HISTORY 04/04/21 Victor Sutton here today for follow-up post possible MG exacerbation post-COVID seen in early June.  Started on prednisone taper. Finished prednisone July 4th. Saw Dr. Brigitte Pulse sodium level is now normal. Felt great on prednisone 20 mg daily. Is taking boost with protein, drinking Pedialyte. Is on potassium, has been low with similar symptoms, on B 12 supplement. He isn't doing outdoor walking right now, mostly because of the heat, busy life. Hasn't gone back to indoor track since [place]. No trouble swallowing or chewing. Feels he would be better with more energy if back to his walking. Remains on Cellcept 500 mg daily. Isn't interested in starting back on prednisone daily.   REVIEW OF SYSTEMS: Out of a complete 14 system review of symptoms, the patient complains only of the following symptoms, and all other reviewed systems are negative.  ALLERGIES: Allergies  Allergen Reactions   Ciprofloxacin  Anaphylaxis    Avoid due to myasthenia gravis   Quinolones Anaphylaxis    Avoid due to myasthenia gravis    HOME MEDICATIONS: Outpatient Medications Prior to Visit  Medication Sig Dispense Refill   COVID-19 mRNA Vac-TriS, Pfizer, SUSP injection USE AS DIRECTED .3 mL 0   hydrochlorothiazide (HYDRODIURIL) 25 MG tablet Take 1 tablet (25 mg total) by mouth daily. 90 tablet 1   Multiple Vitamins-Minerals (ICAPS) CAPS Take 1 capsule by mouth 2 (two) times daily.     mupirocin ointment (BACTROBAN) 2 % Apply 1 application topically 2 (two) times daily. 22 g 0   mycophenolate (CELLCEPT) 500 MG tablet Take 1 tablet (500 mg total) by mouth daily. 90 tablet 3   pantoprazole (PROTONIX) 40 MG tablet Take 40 mg by mouth daily. May take an additional 40mg s as needed for heartburn     potassium chloride (KLOR-CON) 10 MEQ tablet TAKE 1 TABLET BY MOUTH ONCE DAILY 90 tablet 3   Probiotic Product (ALIGN) 4 MG CAPS take 1 po qd     tamsulosin (FLOMAX) 0.4 MG CAPS capsule 0.4 mg as needed.   3   UNABLE TO FIND Take 1 tablet by mouth daily. Med Name:EyePromise - zeaxanthin 10mg  and lutein 10mg      Vitamin D, Ergocalciferol, (DRISDOL) 50000 UNITS CAPS capsule Take 50,000 Units by mouth every 7 (seven) days. ON FRIDAYS     zoledronic acid (RECLAST) 5 MG/100ML SOLN injection Inject 5 mg into the vein once. YEARLY  No facility-administered medications prior to visit.    PAST MEDICAL HISTORY: Past Medical History:  Diagnosis Date   Adenomatous colon polyp    B12 deficiency    HIstory   Basal cell carcinoma 08/18/2006   BASOSQUAMOUS LEFT OUTER FOREHEAD TX CX3 , EXC   BPH (benign prostatic hyperplasia)    Lysbeth Galas ulcer    Cancer [facility])    precancerous skin lesions on leg    Cataract 2009   bilateral cataract surgery   Delayed gastric emptying    Disturbance of salivary secretion 06/20/2013   Dysphagia, pharyngoesophageal phase 03/16/2013   Dysphonia 03/16/2013   Gait abnormality 11/29/2019   GERD  (gastroesophageal reflux disease)    Hiatal hernia    hiatial hernia repair  02/08/13   History of blood transfusion    Hyperlipidemia    not on medication   Hypertension    Iron deficiency anemia    Leukoplakia 2001   Treated for   Macular degeneration    (L) wet, (R) dry   Myasthenia gravis ([place]) 03/21/2013   Peripheral neuropathy    Mild History of   Polyneuritis 1965   Resolved   SCC (squamous cell carcinoma) 03/12/2011   SCC LEFT INNER CHEEK CX3 5FU   SCC (squamous cell carcinoma) 08/24/2012   RIGHT LOWER SHIN TX WITH BX MOHS   SCC (squamous cell carcinoma) 07/26/2014   LEFT UNDER CHEEK CX3 5FU   SCC (squamous cell carcinoma) 03/12/2021   well diff- right temple (CX35FU)   Squamous cell carcinoma of skin 05/03/2008   SCC INSITU RIGHT V OF CHEST TX EXC   Ulcer    Vitamin D deficiency     PAST SURGICAL HISTORY: Past Surgical History:  Procedure Laterality Date   Arthroscopic knee surgery     left   cateract surgeries      bilateral   CHOLECYSTECTOMY     COLONOSCOPY WITH PROPOFOL N/A 02/16/2017   Procedure: COLONOSCOPY WITH PROPOFOL;  Surgeon: Garlan Fair, MD;  [facility];  Service: Endoscopy;  Laterality: N/A;   GASTROSTOMY W/ FEEDING TUBE  [id number]   HERNIA REPAIR     Laparoscopic ventral   HIATAL HERNIA REPAIR  2004   HIATAL HERNIA REPAIR N/A 02/08/2013   Procedure:  REPAIR OFCURRENT HIATAL HERNIA, ;  Surgeon: Odis Hollingshead, MD;  [facility];  Service: General;  Laterality: N/A;   LAPAROSCOPIC LYSIS OF ADHESIONS N/A 02/08/2013   Procedure: LAPAROSCOPIC LYSIS OF ADHESIONS;  Surgeon: Odis Hollingshead, MD;  [facility];  Service: General;  Laterality: N/A;   LAPAROSCOPIC NISSEN FUNDOPLICATION  3/71/6967   Procedure: LAPAROSCOPIC NISSEN FUNDOPLICATION;  Surgeon: Odis Hollingshead, MD;  [facility];  Service: General;;   Multiple oral surgeries  2002   SKIN CANCER EXCISION  10/2012   right leg-shin area   STOMACH SURGERY  2005    states his stomach had moved up toward his chest , some type of surgery performed   TONSILLECTOMY     UPPER GI ENDOSCOPY N/A 02/08/2013   Procedure: UPPER GI ENDOSCOPY;  Surgeon: Odis Hollingshead, MD;  [facility];  Service: General;  Laterality: N/A;    FAMILY HISTORY: Family History  Problem Relation Age of Onset   Diabetes Father    Heart attack Father    Breast cancer Sister    CAD Sister    COPD Sister    Prostate cancer Paternal Grandfather    Colon cancer Son 32   Myasthenia gravis  Son    Colon cancer Son 72    SOCIAL HISTORY: Social History   Socioeconomic History   Marital status: Married    Spouse name: [name]   Number of children: 3   Years of education: 16   Highest education level: Not on file  Occupational History   Occupation: Retired    Fish farm manager: RETIRED  Tobacco Use   Smoking status: Former    Types: Cigarettes    Quit date: 09/15/1976    Years since quitting: 44.7   Smokeless tobacco: Never   Tobacco comments:    Quit 1980  Vaping Use   Vaping Use: Never used  Substance and Sexual Activity   Alcohol use: No    Comment: Moderate alcohol, wine   Drug use: No   Sexual activity: Not on file  Other Topics Concern   Not on file  Social History Narrative   Lives at home, married   Patient is right-handed.   Patient drinks caffeine occasionally.      Social Determinants of Health   Financial Resource Strain: Not on file  Food Insecurity: Not on file  Transportation Needs: Not on file  Physical Activity: Not on file  Stress: Not on file  Social Connections: Not on file  Intimate Partner Violence: Not on file      PHYSICAL EXAM  Vitals:   06/05/21 1025  BP: 116/74  Pulse: 80  Weight: 164 lb 12.8 oz (74.8 kg)  Height: 5\' 9"  (1.753 m)   Body mass index is 24.34 kg/m.  Generalized: Well developed, in no acute distress   Neurological examination  Mentation: Alert oriented to time, place, history taking. Follows all commands  speech and language fluent Cranial nerve II-XII: Pupils were equal round reactive to light. Extraocular movements were full, visual field were full on confrontational test. Facial sensation and strength were normal. Uvula tongue midline. Head turning and shoulder shrug  were normal and symmetric.  Superior gaze held for 1 minute no ptosis or diplopia noted Motor: The motor testing reveals 5 over 5 strength of all 4 extremities. Good symmetric motor tone is noted throughout.  With arms abducted for 1 minute no weakness noted Sensory: Sensory testing is intact to soft touch on all 4 extremities. No evidence of extinction is noted.  Coordination: Cerebellar testing reveals good finger-nose-finger and heel-to-shin bilaterally.  Gait and station: Gait is slightly wide-based.  Tandem gait not attempted.  Gait is slightly unsteady. Reflexes: Deep tendon reflexes are symmetric and normal bilaterally.   DIAGNOSTIC DATA (LABS, IMAGING, TESTING) - I reviewed patient records, labs, notes, testing and imaging myself where available.  Lab Results  Component Value Date   WBC 4.2 12/03/2020   HGB 14.0 12/03/2020   HCT 40.6 12/03/2020   MCV 95 12/03/2020   PLT 239 12/03/2020      Component Value Date/Time   NA 136 12/03/2020 1132   K 3.9 12/03/2020 1132   CL 97 12/03/2020 1132   CO2 25 12/03/2020 1132   GLUCOSE 105 (H) 12/03/2020 1132   GLUCOSE 105 (H) 02/11/2014 1131   BUN 9 (L) 12/03/2020 1132   CREATININE 0.77 12/03/2020 1132   CALCIUM 9.0 12/03/2020 1132   PROT 6.5 12/03/2020 1132   ALBUMIN 3.9 12/03/2020 1132   AST 22 12/03/2020 1132   ALT 14 12/03/2020 1132   ALKPHOS 46 12/03/2020 1132   BILITOT 0.9 12/03/2020 1132   GFRNONAA 78 06/04/2020 0908   GFRAA 90 06/04/2020 0908   Lab Results  Component Value Date   CHOL 118 02/14/2013   TRIG 79 02/21/2013   No results found for: HGBA1C Lab Results  Component Value Date   VITAMINB12 1,057 12/03/2020   Lab Results  Component Value Date    TSH 2.880 11/29/2019      ASSESSMENT AND PLAN 87 y.o. year old male  has a past medical history of Adenomatous colon polyp, B12 deficiency, Basal cell carcinoma (08/18/2006), BPH (benign prostatic hyperplasia), Lysbeth Galas ulcer, Cancer [facility]), Cataract (2009), Delayed gastric emptying, Disturbance of salivary secretion (06/20/2013), Dysphagia, pharyngoesophageal phase (03/16/2013), Dysphonia (03/16/2013), Gait abnormality (11/29/2019), GERD (gastroesophageal reflux disease), Hiatal hernia, History of blood transfusion, Hyperlipidemia, Hypertension, Iron deficiency anemia, Leukoplakia (2001), Macular degeneration, Myasthenia gravis ([place]) (03/21/2013), Peripheral neuropathy, Polyneuritis (1965), SCC (squamous cell carcinoma) (03/12/2011), SCC (squamous cell carcinoma) (08/24/2012), SCC (squamous cell carcinoma) (07/26/2014), SCC (squamous cell carcinoma) (03/12/2021), Squamous cell carcinoma of skin (05/03/2008), Ulcer, and Vitamin D deficiency. here with :  1.  Myasthenia gravis  Continue CellCept 500 mg daily Blood work today Patient would like to retry Mestinon..  will try Back at 30 mg twice a day only if needed. Discussed physical therapy for gait and balance however the patient deferred Patient is established with Dr. Jannifer Franklin but due to his retirement he will be transition to Dr. Earlie Raveling, MSN, NP-C 06/05/2021, 11:35 AM [facility] [address] [place],  [address] [phone]

## 2021-06-05 NOTE — Patient Instructions (Addendum)
Blood work today Continue Cellcept  Use Mestinon 30 mg twice a day if needed If your symptoms worsen or you develop new symptoms please let us know.

## 2021-06-06 LAB — CBC WITH DIFFERENTIAL/PLATELET
Basophils Absolute: 0 10*3/uL (ref 0.0–0.2)
Basos: 0 %
EOS (ABSOLUTE): 0.2 10*3/uL (ref 0.0–0.4)
Eos: 4 %
Hematocrit: 43.1 % (ref 37.5–51.0)
Hemoglobin: 14.7 g/dL (ref 13.0–17.7)
Immature Grans (Abs): 0 10*3/uL (ref 0.0–0.1)
Immature Granulocytes: 0 %
Lymphocytes Absolute: 1.1 10*3/uL (ref 0.7–3.1)
Lymphs: 21 %
MCH: 32 pg (ref 26.6–33.0)
MCHC: 34.1 g/dL (ref 31.5–35.7)
MCV: 94 fL (ref 79–97)
Monocytes Absolute: 0.6 10*3/uL (ref 0.1–0.9)
Monocytes: 11 %
Neutrophils Absolute: 3.4 10*3/uL (ref 1.4–7.0)
Neutrophils: 64 %
Platelets: 237 10*3/uL (ref 150–450)
RBC: 4.59 x10E6/uL (ref 4.14–5.80)
RDW: 11.8 % (ref 11.6–15.4)
WBC: 5.3 10*3/uL (ref 3.4–10.8)

## 2021-06-06 LAB — COMPREHENSIVE METABOLIC PANEL
ALT: 12 IU/L (ref 0–44)
AST: 28 IU/L (ref 0–40)
Albumin/Globulin Ratio: 1.5 (ref 1.2–2.2)
Albumin: 3.9 g/dL (ref 3.5–4.6)
Alkaline Phosphatase: 51 IU/L (ref 44–121)
BUN/Creatinine Ratio: 18 (ref 10–24)
BUN: 14 mg/dL (ref 10–36)
Bilirubin Total: 1.2 mg/dL (ref 0.0–1.2)
CO2: 25 mmol/L (ref 20–29)
Calcium: 9.3 mg/dL (ref 8.6–10.2)
Chloride: 95 mmol/L — ABNORMAL LOW (ref 96–106)
Creatinine, Ser: 0.77 mg/dL (ref 0.76–1.27)
Globulin, Total: 2.6 g/dL (ref 1.5–4.5)
Glucose: 92 mg/dL (ref 65–99)
Potassium: 4.6 mmol/L (ref 3.5–5.2)
Sodium: 134 mmol/L (ref 134–144)
Total Protein: 6.5 g/dL (ref 6.0–8.5)
eGFR: 85 mL/min/{1.73_m2} (ref >59)

## 2021-06-10 DIAGNOSIS — R351 Nocturia: Secondary | ICD-10-CM | POA: Diagnosis not present

## 2021-06-10 DIAGNOSIS — N401 Enlarged prostate with lower urinary tract symptoms: Secondary | ICD-10-CM | POA: Diagnosis not present

## 2021-06-11 ENCOUNTER — Ambulatory Visit: Payer: Medicare Other | Attending: Internal Medicine

## 2021-06-11 ENCOUNTER — Other Ambulatory Visit: Payer: Self-pay

## 2021-06-11 DIAGNOSIS — Z23 Encounter for immunization: Secondary | ICD-10-CM

## 2021-06-11 MED ORDER — PFIZER COVID-19 VAC BIVALENT 30 MCG/0.3ML IM SUSP
INTRAMUSCULAR | 0 refills | Status: DC
  Filled 2021-06-11: qty 0.3, 1d supply, fill #0

## 2021-06-11 NOTE — Progress Notes (Signed)
   Covid-19 Vaccination Clinic  Name:  Victor Sutton    MRN: [id number] DOB: 05-03-1928  06/11/2021  [name] was observed post Covid-19 immunization for 15 minutes without incident. He was provided with Vaccine Information Sheet and instruction to access the V-Safe system.   [name] was instructed to call 911 with any severe reactions post vaccine: Difficulty breathing  Swelling of face and throat  A fast heartbeat  A bad rash all over body  Dizziness and weakness   Lu Duffel, PharmD, MBA Clinical Acute Care Pharmacist

## 2021-07-24 DIAGNOSIS — H903 Sensorineural hearing loss, bilateral: Secondary | ICD-10-CM | POA: Diagnosis not present

## 2021-08-13 ENCOUNTER — Ambulatory Visit: Payer: Medicare Other

## 2021-09-03 ENCOUNTER — Other Ambulatory Visit: Payer: Self-pay

## 2021-09-03 ENCOUNTER — Ambulatory Visit: Payer: Medicare Other | Admitting: Dermatology

## 2021-09-03 ENCOUNTER — Encounter: Payer: Self-pay | Admitting: Dermatology

## 2021-09-03 DIAGNOSIS — L570 Actinic keratosis: Secondary | ICD-10-CM

## 2021-09-03 DIAGNOSIS — Z85828 Personal history of other malignant neoplasm of skin: Secondary | ICD-10-CM | POA: Diagnosis not present

## 2021-09-03 DIAGNOSIS — Z8589 Personal history of malignant neoplasm of other organs and systems: Secondary | ICD-10-CM

## 2021-09-05 ENCOUNTER — Ambulatory Visit: Payer: Medicare Other | Admitting: Neurology

## 2021-09-28 ENCOUNTER — Encounter: Payer: Self-pay | Admitting: Dermatology

## 2021-09-28 NOTE — Progress Notes (Signed)
° °  Follow-Up Visit   Subjective  Victor Sutton is a 88 y.o. male who presents for the following: Follow-up (3 month recheck- right posterior neck- bx'd & tx'd at 05/23/21 office visit but never healed).  Recheck site of recent skin cancer and new crusts on forehead/scalp Location:  Duration:  Quality:  Associated Signs/Symptoms: Modifying Factors:  Severity:  Timing: Context:   Objective  Well appearing patient in no apparent distress; mood and affect are within normal limits. Right Posterior Neck History of SCC. Bx and Tx  05/23/2021. Area is clear with moderate dyschromia.  Left Forehead, Left Frontal Scalp Gritty 5 mm pink crusts     All skin waist up examined.   Assessment & Plan    History of squamous cell carcinoma Right Posterior Neck  Recheck as needed change  Actinic keratosis (2) Left Frontal Scalp; Left Forehead  Pt will return in 6 months for recheck and possible bx      I, Lavonna Monarch, MD, have reviewed all documentation for this visit.  The documentation on 09/28/21 for the exam, diagnosis, procedures, and orders are all accurate and complete.

## 2021-10-24 ENCOUNTER — Other Ambulatory Visit: Payer: Self-pay

## 2021-10-24 ENCOUNTER — Ambulatory Visit: Payer: Medicare Other | Admitting: Neurology

## 2021-10-24 ENCOUNTER — Encounter: Payer: Self-pay | Admitting: Neurology

## 2021-10-24 ENCOUNTER — Telehealth: Payer: Self-pay | Admitting: Neurology

## 2021-10-24 VITALS — BP 139/73 | HR 80 | Ht 69.0 in | Wt 165.5 lb

## 2021-10-24 DIAGNOSIS — R269 Unspecified abnormalities of gait and mobility: Secondary | ICD-10-CM

## 2021-10-24 DIAGNOSIS — G7000 Myasthenia gravis without (acute) exacerbation: Secondary | ICD-10-CM

## 2021-10-24 MED ORDER — PYRIDOSTIGMINE BROMIDE 60 MG PO TABS: 30.0000 mg | ORAL_TABLET | Freq: Two times a day (BID) | ORAL | 3 refills | Status: DC | PRN

## 2021-10-24 MED ORDER — MYCOPHENOLATE MOFETIL 250 MG PO CAPS: 250.0000 mg | ORAL_CAPSULE | Freq: Two times a day (BID) | ORAL | 4 refills | Status: DC

## 2021-10-24 NOTE — Telephone Encounter (Signed)
[facility]  is able to take the patient.

## 2021-10-24 NOTE — Progress Notes (Signed)
Chief Complaint  Patient presents with   Follow-up    Rm 14. PCP is Dr. Sable Feil. Accompanied by wife, Victor Sutton. Pt c/o fatigue that is random and sudden. States legs are shaky sometimes. Has difficulty shaving at times due to shaking. Pt continues on cellcept and mestinon 30 mg BID PRN.      ASSESSMENT AND PLAN  Victor Sutton is a 88 y.o. male   Seropositive myasthenia gravis,  Diagnosis was made in 2014, at his worst, he has significant dysarthria, dysphagia, required feeding tube for few months, responded very well to prednisone, later was able to taper off, maintained on low-dose of CellCept 250 mg twice a day, Mestinon as needed, 60 mg half tablets 3 times a day  Worsening fatigue, unsteady gait Low back pain radiating pain to left hip,  Since he was diagnosed with COVID in May 2022,  Stiff wide-based unsteady gait in the setting of fairly normal lower extremity proximal and distal motor strength, brisk reflex, bilateral Babinski signs,  MRI of cervical, lumbar spine to rule out degenerative disease that is contributing to his complaints of gait abnormality.  Home health physical therapy     DIAGNOSTIC DATA (LABS, IMAGING, TESTING) - I reviewed patient records, labs, notes, testing and imaging myself where available.   MEDICAL HISTORY:  Victor Sutton is a 88 year old male, follow-up for seropositive generalized myasthenia gravis,  I reviewed and summarized the referring note.  Past medical history Hypertension Prostate hypertrophy  Patient's and his family noted as early as 2010, he was noted to have mild gait abnormality, in 2014, he developed facial weakness, significant bulbar weakness, dysarthria, dysphagia, to the point of requiring feeding tube, eventually he was seen by Dr. Jannifer Franklin in July 2014, was diagnosed with seropositive generalized myasthenia gravis  He was treated with prednisone 30 mg daily, Mestinon, began to show significant improvement,  later CellCept 500 mg twice daily was added on, since 2014, about 6 months into treatment, he showed significant improvement, then developed bilateral lower extremity weakness, was considered due to high dose of steroid treatment, began steroid tapering, eventually was able to taper off steroid at the very slow pace in May 2017 without recurrent weakness, CellCept later on was decreased to 500 mg daily, he was able to taper off Mestinon as needed,  Over the years, his symptoms is well controlled, highly functioning, but he suffered COVID in May 2022, reported chest cold-like symptoms, only last for few days  But ever since then, he noticed fatigue, on further questioning, he described overwhelming waves of unsteadiness, tightness sensation over his body, he would feel his legs give out underneath him if he is in a standing position, often helped by sitting down,  At last visit September 2022, he was seen by Jinny Blossom, restarted Mestinon 60 mg half tablets twice daily, patient reported that it did help his symptoms  He also complains of urinary frequency, frequent nocturia, occasionally incontinence, low back pain radiating pain to left hip,  PHYSICAL EXAM:   Vitals:   10/24/21 0851  BP: 139/73  Pulse: 80  Weight: 165 lb 8 oz (75.1 kg)  Height: 5\' 9"  (1.753 m)   Not recorded     Body mass index is 24.44 kg/m.  PHYSICAL EXAMNIATION:  Gen: NAD, conversant, well nourised, well groomed                     Cardiovascular: Regular rate rhythm, no peripheral edema, warm, nontender. Eyes:  Conjunctivae clear without exudates or hemorrhage Neck: Supple, no carotid bruits. Pulmonary: Clear to auscultation bilaterally   NEUROLOGICAL EXAM:  MENTAL STATUS: Speech/cognition: Awake, alert, oriented to history taking and casual conversation   CRANIAL NERVES: CN II: Visual fields are full to confrontation. Pupils are round equal and briskly reactive to light. CN III, IV, VI: extraocular movement  are normal. No ptosis. CN V: Facial sensation is intact to light touch CN VII: Face is symmetric with normal eye closure, no significant bulbar muscle weakness noted, CN VIII: Hearing is normal to causal conversation. CN IX, X: Phonation is normal. CN XI: Head turning and shoulder shrug are intact  MOTOR: Normal neck flexion, upper and lower extremity proximal and distal muscles  REFLEXES: Reflexes are 2 and symmetric at the biceps, triceps, knees, and ankles. Plantar responses are extensor bilaterally  SENSORY: Decreased vibratory sensation at toes  COORDINATION: There is no trunk or limb dysmetria noted.  GAIT/STANCE: He needs to push on chair arm to get up from seated position, wide-based, stiff cautious gait  REVIEW OF SYSTEMS:  Full 14 system review of systems performed and notable only for as above All other review of systems were negative.   ALLERGIES: Allergies  Allergen Reactions   Ciprofloxacin Anaphylaxis    Avoid due to myasthenia gravis   Quinolones Anaphylaxis    Avoid due to myasthenia gravis    HOME MEDICATIONS: Current Outpatient Medications  Medication Sig Dispense Refill   ASCORBIC ACID PO Take 250 mg by mouth 2 (two) times daily.     COVID-19 mRNA bivalent vaccine, Pfizer, (PFIZER COVID-19 VAC BIVALENT) injection Inject into the muscle. 0.3 mL 0   COVID-19 mRNA Vac-TriS, Pfizer, SUSP injection USE AS DIRECTED .3 mL 0   hydrochlorothiazide (HYDRODIURIL) 25 MG tablet Take 1 tablet (25 mg total) by mouth daily. 90 tablet 1   Multiple Vitamins-Minerals (ICAPS) CAPS Take 1 capsule by mouth 2 (two) times daily.     mycophenolate (CELLCEPT) 500 MG tablet Take 1 tablet (500 mg total) by mouth daily. 90 tablet 3   pantoprazole (PROTONIX) 40 MG tablet Take 40 mg by mouth daily. May take an additional 40mg s as needed for heartburn     potassium chloride (KLOR-CON) 10 MEQ tablet TAKE 1 TABLET BY MOUTH ONCE DAILY 90 tablet 3   Probiotic Product (ALIGN) 4 MG CAPS  take 1 po qd     pyridostigmine (MESTINON) 60 MG tablet Take 0.5 tablets (30 mg total) by mouth 2 (two) times daily as needed. 60 tablet 5   tamsulosin (FLOMAX) 0.4 MG CAPS capsule 0.4 mg as needed.   3   UNABLE TO FIND Take 1 tablet by mouth daily. Med Name:EyePromise - zeaxanthin 10mg  and lutein 10mg      Vitamin D, Ergocalciferol, (DRISDOL) 50000 UNITS CAPS capsule Take 50,000 Units by mouth every 7 (seven) days. ON FRIDAYS     zoledronic acid (RECLAST) 5 MG/100ML SOLN injection Inject 5 mg into the vein once. YEARLY     No current facility-administered medications for this visit.    PAST MEDICAL HISTORY: Past Medical History:  Diagnosis Date   Adenomatous colon polyp    B12 deficiency    HIstory   Basal cell carcinoma 08/18/2006   BASOSQUAMOUS LEFT OUTER FOREHEAD TX CX3 , EXC   BPH (benign prostatic hyperplasia)    Lysbeth Galas ulcer    Cancer [facility])    precancerous skin lesions on leg    Cataract 2009   bilateral cataract surgery   Delayed  gastric emptying    Disturbance of salivary secretion 06/20/2013   Dysphagia, pharyngoesophageal phase 03/16/2013   Dysphonia 03/16/2013   Gait abnormality 11/29/2019   GERD (gastroesophageal reflux disease)    Hiatal hernia    hiatial hernia repair  02/08/13   History of blood transfusion    Hyperlipidemia    not on medication   Hypertension    Iron deficiency anemia    Leukoplakia 2001   Treated for   Macular degeneration    (L) wet, (R) dry   Myasthenia gravis ([place]) 03/21/2013   Peripheral neuropathy    Mild History of   Polyneuritis 1965   Resolved   SCC (squamous cell carcinoma) 03/12/2011   SCC LEFT INNER CHEEK CX3 5FU   SCC (squamous cell carcinoma) 08/24/2012   RIGHT LOWER SHIN TX WITH BX MOHS   SCC (squamous cell carcinoma) 07/26/2014   LEFT UNDER CHEEK CX3 5FU   SCC (squamous cell carcinoma) 03/12/2021   well diff- right temple (CX35FU)   Squamous cell carcinoma of skin 05/03/2008   SCC INSITU RIGHT V OF CHEST TX EXC    Ulcer    Vitamin D deficiency     PAST SURGICAL HISTORY: Past Surgical History:  Procedure Laterality Date   Arthroscopic knee surgery     left   cateract surgeries      bilateral   CHOLECYSTECTOMY     COLONOSCOPY WITH PROPOFOL N/A 02/16/2017   Procedure: COLONOSCOPY WITH PROPOFOL;  Surgeon: Garlan Fair, MD;  [facility];  Service: Endoscopy;  Laterality: N/A;   GASTROSTOMY W/ FEEDING TUBE  [id number]   HERNIA REPAIR     Laparoscopic ventral   HIATAL HERNIA REPAIR  2004   HIATAL HERNIA REPAIR N/A 02/08/2013   Procedure:  REPAIR OFCURRENT HIATAL HERNIA, ;  Surgeon: Odis Hollingshead, MD;  [facility];  Service: General;  Laterality: N/A;   LAPAROSCOPIC LYSIS OF ADHESIONS N/A 02/08/2013   Procedure: LAPAROSCOPIC LYSIS OF ADHESIONS;  Surgeon: Odis Hollingshead, MD;  [facility];  Service: General;  Laterality: N/A;   LAPAROSCOPIC NISSEN FUNDOPLICATION  5/95/6387   Procedure: LAPAROSCOPIC NISSEN FUNDOPLICATION;  Surgeon: Odis Hollingshead, MD;  [facility];  Service: General;;   Multiple oral surgeries  2002   SKIN CANCER EXCISION  10/2012   right leg-shin area   STOMACH SURGERY  2005   states his stomach had moved up toward his chest , some type of surgery performed   TONSILLECTOMY     UPPER GI ENDOSCOPY N/A 02/08/2013   Procedure: UPPER GI ENDOSCOPY;  Surgeon: Odis Hollingshead, MD;  [facility];  Service: General;  Laterality: N/A;    FAMILY HISTORY: Family History  Problem Relation Age of Onset   Diabetes Father    Heart attack Father    Breast cancer Sister    CAD Sister    COPD Sister    Prostate cancer Paternal Grandfather    Colon cancer Son 86   Myasthenia gravis Son    Colon cancer Son 64    SOCIAL HISTORY: Social History   Socioeconomic History   Marital status: Married    Spouse name: Victor Sutton   Number of children: 3   Years of education: 16   Highest education level: Not on file  Occupational History   Occupation: Retired     Fish farm manager: RETIRED  Tobacco Use   Smoking status: Former    Types: Cigarettes    Quit date: 09/15/1976    Years  since quitting: 45.1   Smokeless tobacco: Never   Tobacco comments:    Quit 1980  Vaping Use   Vaping Use: Never used  Substance and Sexual Activity   Alcohol use: No    Comment: Moderate alcohol, wine   Drug use: No   Sexual activity: Not on file  Other Topics Concern   Not on file  Social History Narrative   Lives at home, married   Patient is right-handed.   Patient drinks caffeine occasionally.      Social Determinants of Health   Financial Resource Strain: Not on file  Food Insecurity: Not on file  Transportation Needs: Not on file  Physical Activity: Not on file  Stress: Not on file  Social Connections: Not on file  Intimate Partner Violence: Not on file    Total time spent reviewing the chart, obtaining history, examined patient, ordering tests, documentation, consultations and family, care coordination was 41    Marcial Pacas, M.D. Ph.D.  [facility] [address], [place], [place] [address] Ph: [phone] Fax: [phone]  CC:  Ginger Organ., MD [address] [place],  [place] [address]  Ginger Organ., MD

## 2021-10-24 NOTE — Telephone Encounter (Signed)
Sent a message to [place] with [facility] to see if they will be able to take the patient.

## 2021-10-25 ENCOUNTER — Telehealth: Payer: Self-pay | Admitting: Neurology

## 2021-10-25 NOTE — Telephone Encounter (Signed)
[employer] medicare order sent to [facility], [facility] they will reach out to the patient to schedule.

## 2021-10-26 ENCOUNTER — Encounter: Payer: Self-pay | Admitting: Neurology

## 2021-10-28 LAB — CK: Total CK: 67 U/L (ref 30–208)

## 2021-10-28 LAB — TSH: TSH: 1.9 u[IU]/mL (ref 0.450–4.500)

## 2021-10-28 LAB — ACETYLCHOLINE RECEPTOR, BINDING: AChR Binding Ab, Serum: 4.72 nmol/L — ABNORMAL HIGH (ref 0.00–0.24)

## 2021-10-29 ENCOUNTER — Encounter: Payer: Self-pay | Admitting: Neurology

## 2021-10-29 ENCOUNTER — Telehealth: Payer: Self-pay | Admitting: Neurology

## 2021-10-29 DIAGNOSIS — I4891 Unspecified atrial fibrillation: Secondary | ICD-10-CM | POA: Diagnosis not present

## 2021-10-29 DIAGNOSIS — H3530 Unspecified macular degeneration: Secondary | ICD-10-CM | POA: Diagnosis not present

## 2021-10-29 DIAGNOSIS — G510 Bell's palsy: Secondary | ICD-10-CM | POA: Diagnosis not present

## 2021-10-29 DIAGNOSIS — G3184 Mild cognitive impairment, so stated: Secondary | ICD-10-CM | POA: Diagnosis not present

## 2021-10-29 DIAGNOSIS — K219 Gastro-esophageal reflux disease without esophagitis: Secondary | ICD-10-CM | POA: Diagnosis not present

## 2021-10-29 DIAGNOSIS — N39498 Other specified urinary incontinence: Secondary | ICD-10-CM | POA: Diagnosis not present

## 2021-10-29 DIAGNOSIS — N401 Enlarged prostate with lower urinary tract symptoms: Secondary | ICD-10-CM | POA: Diagnosis not present

## 2021-10-29 DIAGNOSIS — G629 Polyneuropathy, unspecified: Secondary | ICD-10-CM | POA: Diagnosis not present

## 2021-10-29 DIAGNOSIS — M25552 Pain in left hip: Secondary | ICD-10-CM | POA: Diagnosis not present

## 2021-10-29 DIAGNOSIS — M545 Low back pain, unspecified: Secondary | ICD-10-CM | POA: Diagnosis not present

## 2021-10-29 DIAGNOSIS — I10 Essential (primary) hypertension: Secondary | ICD-10-CM | POA: Diagnosis not present

## 2021-10-29 DIAGNOSIS — G7000 Myasthenia gravis without (acute) exacerbation: Secondary | ICD-10-CM | POA: Diagnosis not present

## 2021-10-29 DIAGNOSIS — R1314 Dysphagia, pharyngoesophageal phase: Secondary | ICD-10-CM | POA: Diagnosis not present

## 2021-10-29 DIAGNOSIS — D509 Iron deficiency anemia, unspecified: Secondary | ICD-10-CM | POA: Diagnosis not present

## 2021-10-29 DIAGNOSIS — E43 Unspecified severe protein-calorie malnutrition: Secondary | ICD-10-CM | POA: Diagnosis not present

## 2021-10-29 DIAGNOSIS — K117 Disturbances of salivary secretion: Secondary | ICD-10-CM | POA: Diagnosis not present

## 2021-10-29 NOTE — Telephone Encounter (Signed)
[facility](Kate) request verbal orders for physical therapy Frequency 2x for 6 wks, 1x wk for 3 wks

## 2021-10-29 NOTE — Telephone Encounter (Signed)
I returned the call to [facility]. She will proceed with the orders as outlined below.

## 2021-11-05 DIAGNOSIS — I10 Essential (primary) hypertension: Secondary | ICD-10-CM | POA: Diagnosis not present

## 2021-11-05 DIAGNOSIS — M810 Age-related osteoporosis without current pathological fracture: Secondary | ICD-10-CM | POA: Diagnosis not present

## 2021-11-05 DIAGNOSIS — Z125 Encounter for screening for malignant neoplasm of prostate: Secondary | ICD-10-CM | POA: Diagnosis not present

## 2021-11-05 DIAGNOSIS — E538 Deficiency of other specified B group vitamins: Secondary | ICD-10-CM | POA: Diagnosis not present

## 2021-11-08 ENCOUNTER — Ambulatory Visit
Admission: RE | Admit: 2021-11-08 | Discharge: 2021-11-08 | Disposition: A | Discharge status: Home / Self Care | Payer: Medicare Other | Source: Ambulatory Visit | Attending: Neurology | Admitting: Neurology

## 2021-11-08 ENCOUNTER — Other Ambulatory Visit: Payer: Self-pay

## 2021-11-08 DIAGNOSIS — R269 Unspecified abnormalities of gait and mobility: Secondary | ICD-10-CM | POA: Diagnosis not present

## 2021-11-08 DIAGNOSIS — G7000 Myasthenia gravis without (acute) exacerbation: Secondary | ICD-10-CM

## 2021-11-11 ENCOUNTER — Telehealth: Payer: Self-pay | Admitting: Neurology

## 2021-11-11 NOTE — Telephone Encounter (Addendum)
Please call patient, MRI of cervical spine showed mild degenerative changes, no evidence of spinal cord compression  MRI of lumbar spine showed moderate spinal stenosis at L2-3 level, right greater than left foraminal stenosis,  If he continues to have significant low back pain, may consider pain management referral,  IMPRESSION: This MRI of the cervical spine without contrast shows the following: 1.   Multilevel degenerative changes as detailed above causing mild spinal stenosis at C3-C4 and C4-C5.  Additionally at C3-C4 there is moderate right foraminal narrowing with some potential to affect the right C4 nerve root. 2.   The spinal cord appears normal.   IMPRESSION: This MRI of the lumbar spine without contrast shows the following: 1.   At L2-L3, there is moderate spinal stenosis due to degenerative changes affecting the transverse diameter more than the AP diameter.  There is moderate right greater than left lateral recess stenosis with some potential to affect the L3 nerve roots 2.   At L3-L4, there is mild spinal stenosis due to degenerative changes that also causes moderately severe bilateral lateral recess stenosis with potential to affect the L4 nerve roots. 3.   At L4-L5, there is borderline spinal stenosis due to degenerative changes and trace anterolisthesis but no nerve root compression 4.   At L5-S1, there is retrolisthesis and other degenerative change though there does not appear to be spinal stenosis or nerve root compression.

## 2021-11-11 NOTE — Telephone Encounter (Signed)
Spoke with patient and informed of results. Informed pt he should seek pain management referral if back pain continues. Pt verbalized understanding and expressed appreciation for the call. All questions answered.

## 2021-11-12 DIAGNOSIS — I2510 Atherosclerotic heart disease of native coronary artery without angina pectoris: Secondary | ICD-10-CM | POA: Diagnosis not present

## 2021-11-12 DIAGNOSIS — I10 Essential (primary) hypertension: Secondary | ICD-10-CM | POA: Diagnosis not present

## 2021-11-12 DIAGNOSIS — I350 Nonrheumatic aortic (valve) stenosis: Secondary | ICD-10-CM | POA: Diagnosis not present

## 2021-11-12 DIAGNOSIS — Z0000 Encounter for general adult medical examination without abnormal findings: Secondary | ICD-10-CM | POA: Diagnosis not present

## 2021-11-28 DIAGNOSIS — M545 Low back pain, unspecified: Secondary | ICD-10-CM | POA: Diagnosis not present

## 2021-11-28 DIAGNOSIS — G629 Polyneuropathy, unspecified: Secondary | ICD-10-CM | POA: Diagnosis not present

## 2021-11-28 DIAGNOSIS — D509 Iron deficiency anemia, unspecified: Secondary | ICD-10-CM | POA: Diagnosis not present

## 2021-11-28 DIAGNOSIS — M25552 Pain in left hip: Secondary | ICD-10-CM | POA: Diagnosis not present

## 2021-11-28 DIAGNOSIS — G3184 Mild cognitive impairment, so stated: Secondary | ICD-10-CM | POA: Diagnosis not present

## 2021-11-28 DIAGNOSIS — I4891 Unspecified atrial fibrillation: Secondary | ICD-10-CM | POA: Diagnosis not present

## 2021-11-28 DIAGNOSIS — E43 Unspecified severe protein-calorie malnutrition: Secondary | ICD-10-CM | POA: Diagnosis not present

## 2021-11-28 DIAGNOSIS — R1314 Dysphagia, pharyngoesophageal phase: Secondary | ICD-10-CM | POA: Diagnosis not present

## 2021-11-28 DIAGNOSIS — K117 Disturbances of salivary secretion: Secondary | ICD-10-CM | POA: Diagnosis not present

## 2021-11-28 DIAGNOSIS — K219 Gastro-esophageal reflux disease without esophagitis: Secondary | ICD-10-CM | POA: Diagnosis not present

## 2021-11-28 DIAGNOSIS — G7000 Myasthenia gravis without (acute) exacerbation: Secondary | ICD-10-CM | POA: Diagnosis not present

## 2021-11-28 DIAGNOSIS — G510 Bell's palsy: Secondary | ICD-10-CM | POA: Diagnosis not present

## 2021-11-28 DIAGNOSIS — N401 Enlarged prostate with lower urinary tract symptoms: Secondary | ICD-10-CM | POA: Diagnosis not present

## 2021-11-28 DIAGNOSIS — H3530 Unspecified macular degeneration: Secondary | ICD-10-CM | POA: Diagnosis not present

## 2021-11-28 DIAGNOSIS — I10 Essential (primary) hypertension: Secondary | ICD-10-CM | POA: Diagnosis not present

## 2021-11-28 DIAGNOSIS — N39498 Other specified urinary incontinence: Secondary | ICD-10-CM | POA: Diagnosis not present

## 2021-12-23 DIAGNOSIS — K219 Gastro-esophageal reflux disease without esophagitis: Secondary | ICD-10-CM | POA: Diagnosis not present

## 2021-12-23 DIAGNOSIS — G7000 Myasthenia gravis without (acute) exacerbation: Secondary | ICD-10-CM | POA: Diagnosis not present

## 2021-12-23 DIAGNOSIS — K3184 Gastroparesis: Secondary | ICD-10-CM | POA: Diagnosis not present

## 2021-12-23 DIAGNOSIS — E441 Mild protein-calorie malnutrition: Secondary | ICD-10-CM | POA: Diagnosis not present

## 2021-12-23 DIAGNOSIS — R197 Diarrhea, unspecified: Secondary | ICD-10-CM | POA: Diagnosis not present

## 2021-12-24 ENCOUNTER — Telehealth: Payer: Self-pay | Admitting: Neurology

## 2021-12-24 NOTE — Telephone Encounter (Signed)
[facility] [place]) request verbal orders for in home PT. ?Frequency: ?1x wk for 2 wks  ?2x wk for 5 wks  ?1x wk for 2 wks. ? ?Would like a call from the nurse. ?

## 2021-12-24 NOTE — Telephone Encounter (Signed)
Returned the call to Anda Kraft (PT at [facility]). She will proceed with the needed orders below. ?

## 2021-12-28 DIAGNOSIS — K117 Disturbances of salivary secretion: Secondary | ICD-10-CM | POA: Diagnosis not present

## 2021-12-28 DIAGNOSIS — G510 Bell's palsy: Secondary | ICD-10-CM | POA: Diagnosis not present

## 2021-12-28 DIAGNOSIS — K219 Gastro-esophageal reflux disease without esophagitis: Secondary | ICD-10-CM | POA: Diagnosis not present

## 2021-12-28 DIAGNOSIS — M545 Low back pain, unspecified: Secondary | ICD-10-CM | POA: Diagnosis not present

## 2021-12-28 DIAGNOSIS — N401 Enlarged prostate with lower urinary tract symptoms: Secondary | ICD-10-CM | POA: Diagnosis not present

## 2021-12-28 DIAGNOSIS — M25552 Pain in left hip: Secondary | ICD-10-CM | POA: Diagnosis not present

## 2021-12-28 DIAGNOSIS — G629 Polyneuropathy, unspecified: Secondary | ICD-10-CM | POA: Diagnosis not present

## 2021-12-28 DIAGNOSIS — E43 Unspecified severe protein-calorie malnutrition: Secondary | ICD-10-CM | POA: Diagnosis not present

## 2021-12-28 DIAGNOSIS — I4891 Unspecified atrial fibrillation: Secondary | ICD-10-CM | POA: Diagnosis not present

## 2021-12-28 DIAGNOSIS — G7000 Myasthenia gravis without (acute) exacerbation: Secondary | ICD-10-CM | POA: Diagnosis not present

## 2021-12-28 DIAGNOSIS — D509 Iron deficiency anemia, unspecified: Secondary | ICD-10-CM | POA: Diagnosis not present

## 2021-12-28 DIAGNOSIS — N39498 Other specified urinary incontinence: Secondary | ICD-10-CM | POA: Diagnosis not present

## 2021-12-28 DIAGNOSIS — H3530 Unspecified macular degeneration: Secondary | ICD-10-CM | POA: Diagnosis not present

## 2021-12-28 DIAGNOSIS — G3184 Mild cognitive impairment, so stated: Secondary | ICD-10-CM | POA: Diagnosis not present

## 2021-12-28 DIAGNOSIS — I10 Essential (primary) hypertension: Secondary | ICD-10-CM | POA: Diagnosis not present

## 2021-12-28 DIAGNOSIS — R1314 Dysphagia, pharyngoesophageal phase: Secondary | ICD-10-CM | POA: Diagnosis not present

## 2022-01-10 ENCOUNTER — Other Ambulatory Visit: Payer: Self-pay

## 2022-01-15 DIAGNOSIS — R82998 Other abnormal findings in urine: Secondary | ICD-10-CM | POA: Diagnosis not present

## 2022-01-16 ENCOUNTER — Other Ambulatory Visit: Payer: Self-pay

## 2022-01-16 ENCOUNTER — Ambulatory Visit: Payer: Medicare Other | Attending: Internal Medicine

## 2022-01-16 DIAGNOSIS — Z23 Encounter for immunization: Secondary | ICD-10-CM

## 2022-01-16 MED ORDER — PFIZER COVID-19 VAC BIVALENT 30 MCG/0.3ML IM SUSP
INTRAMUSCULAR | 0 refills | Status: DC
  Filled 2022-01-16: qty 0.3, 1d supply, fill #0

## 2022-01-16 NOTE — Progress Notes (Signed)
? ?  Covid-19 Vaccination Clinic ? ?Name:  Victor Sutton    ?MRN: [id number] ?DOB: Jul 24, 1928 ? ?01/16/2022 ? ?Mr. Boulet was observed post Covid-19 immunization for 15 minutes without incident. He was provided with Vaccine Information Sheet and instruction to access the V-Safe system.  ? ?Mr. Greek was instructed to call 911 with any severe reactions post vaccine: ?Difficulty breathing  ?Swelling of face and throat  ?A fast heartbeat  ?A bad rash all over body  ?Dizziness and weakness  ? ?Immunizations Administered   ? ? Name Date Dose VIS Date Route  ? Ambulance person Booster 01/16/2022 11:08 AM 0.3 mL 05/15/2021 Intramuscular  ? Manufacturer: [employer]: [id number]  ? [employer]: [id number]  ? ?  ? ?

## 2022-01-22 ENCOUNTER — Ambulatory Visit: Payer: Medicare Other | Admitting: Dermatology

## 2022-01-22 ENCOUNTER — Encounter: Payer: Self-pay | Admitting: Dermatology

## 2022-01-22 DIAGNOSIS — D485 Neoplasm of uncertain behavior of skin: Secondary | ICD-10-CM

## 2022-01-22 DIAGNOSIS — L821 Other seborrheic keratosis: Secondary | ICD-10-CM

## 2022-01-22 DIAGNOSIS — D044 Carcinoma in situ of skin of scalp and neck: Secondary | ICD-10-CM

## 2022-01-22 NOTE — Patient Instructions (Signed)

## 2022-01-27 DIAGNOSIS — D509 Iron deficiency anemia, unspecified: Secondary | ICD-10-CM | POA: Diagnosis not present

## 2022-01-27 DIAGNOSIS — G7000 Myasthenia gravis without (acute) exacerbation: Secondary | ICD-10-CM | POA: Diagnosis not present

## 2022-01-27 DIAGNOSIS — N401 Enlarged prostate with lower urinary tract symptoms: Secondary | ICD-10-CM | POA: Diagnosis not present

## 2022-01-27 DIAGNOSIS — M25552 Pain in left hip: Secondary | ICD-10-CM | POA: Diagnosis not present

## 2022-01-27 DIAGNOSIS — G629 Polyneuropathy, unspecified: Secondary | ICD-10-CM | POA: Diagnosis not present

## 2022-01-27 DIAGNOSIS — I4891 Unspecified atrial fibrillation: Secondary | ICD-10-CM | POA: Diagnosis not present

## 2022-01-27 DIAGNOSIS — K117 Disturbances of salivary secretion: Secondary | ICD-10-CM | POA: Diagnosis not present

## 2022-01-27 DIAGNOSIS — I10 Essential (primary) hypertension: Secondary | ICD-10-CM | POA: Diagnosis not present

## 2022-01-27 DIAGNOSIS — K219 Gastro-esophageal reflux disease without esophagitis: Secondary | ICD-10-CM | POA: Diagnosis not present

## 2022-01-27 DIAGNOSIS — H3530 Unspecified macular degeneration: Secondary | ICD-10-CM | POA: Diagnosis not present

## 2022-01-27 DIAGNOSIS — M545 Low back pain, unspecified: Secondary | ICD-10-CM | POA: Diagnosis not present

## 2022-01-27 DIAGNOSIS — R1314 Dysphagia, pharyngoesophageal phase: Secondary | ICD-10-CM | POA: Diagnosis not present

## 2022-01-27 DIAGNOSIS — N39498 Other specified urinary incontinence: Secondary | ICD-10-CM | POA: Diagnosis not present

## 2022-01-27 DIAGNOSIS — G510 Bell's palsy: Secondary | ICD-10-CM | POA: Diagnosis not present

## 2022-01-27 DIAGNOSIS — G3184 Mild cognitive impairment, so stated: Secondary | ICD-10-CM | POA: Diagnosis not present

## 2022-01-27 DIAGNOSIS — E43 Unspecified severe protein-calorie malnutrition: Secondary | ICD-10-CM | POA: Diagnosis not present

## 2022-02-09 ENCOUNTER — Encounter: Payer: Self-pay | Admitting: Dermatology

## 2022-02-09 NOTE — Progress Notes (Signed)
   Follow-Up Visit   Subjective  Victor Sutton is a 88 y.o. male who presents for the following: Annual Exam (New lesions on scalp & left and right shoulders- dark spots that itch ).  Large in spot scalp, rough spots on shoulders. Location:  Duration:  Quality:  Associated Signs/Symptoms: Modifying Factors:  Severity:  Timing: Context:   Objective  Well appearing patient in no apparent distress; mood and affect are within normal limits. Left Mid Frontal Scalp Waxy 9 mm pink crust       Right Supraclavicular Area Two brown 5 mm flattopped textured papules, compatible dermoscopy    A focused examination was performed including upper torso, head and neck.. Relevant physical exam findings are noted in the Assessment and Plan.   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 Left Mid Frontal Scalp  Skin / nail biopsy Type of biopsy: tangential   Informed consent: discussed and consent obtained   Timeout: patient name, date of birth, surgical site, and procedure verified   Procedure prep:  Patient was prepped and draped in usual sterile fashion (Non sterile) Prep type:  Chlorhexidine Anesthesia: the lesion was anesthetized in a standard fashion   Anesthetic:  1% lidocaine w/ epinephrine 1-100,000 local infiltration Instrument used: flexible razor blade   Hemostasis achieved with: ferric subsulfate   Outcome: patient tolerated procedure well   Post-procedure details: sterile dressing applied and wound care instructions given   Dressing type: bandage and petrolatum    Destruction of lesion Complexity: simple   Destruction method: electrodesiccation and curettage   Informed consent: discussed and consent obtained   Timeout:  patient name, date of birth, surgical site, and procedure verified Anesthesia: the lesion was anesthetized in a standard fashion   Anesthetic:  1% lidocaine w/ epinephrine 1-100,000 local infiltration Curettage performed in  three different directions: Yes     Electrodesiccation performed over the curetted area: No   Curettage cycles:  3 Lesion length (cm):  1.1 Lesion width (cm):  1.1 Margin per side (cm):  0 Final wound size (cm):  1.1 Hemostasis achieved with:  ferric subsulfate Outcome: patient tolerated procedure well with no complications   Post-procedure details: sterile dressing applied and wound care instructions given   Dressing type: bandage and petrolatum    Specimen 1 - Surgical pathology Differential Diagnosis: R/O BCC vs SCC - treated after biopsy  Check Margins: No  Shave biopsy the base of the lesion was treated with curettage plus cautery.  Seborrheic keratosis Right Supraclavicular Area  Not bothersome to patient, leave if stable      I, Lavonna Monarch, MD, have reviewed all documentation for this visit.  The documentation on 02/09/22 for the exam, diagnosis, procedures, and orders are all accurate and complete.

## 2022-03-04 ENCOUNTER — Ambulatory Visit: Payer: Medicare Other | Admitting: Dermatology

## 2022-04-29 ENCOUNTER — Ambulatory Visit: Payer: Medicare Other | Admitting: Neurology

## 2022-04-29 VITALS — BP 129/66 | HR 73 | Ht 69.0 in | Wt 159.0 lb

## 2022-04-29 DIAGNOSIS — R269 Unspecified abnormalities of gait and mobility: Secondary | ICD-10-CM | POA: Diagnosis not present

## 2022-04-29 DIAGNOSIS — G7000 Myasthenia gravis without (acute) exacerbation: Secondary | ICD-10-CM

## 2022-04-29 MED ORDER — PYRIDOSTIGMINE BROMIDE 60 MG PO TABS: 30.0000 mg | ORAL_TABLET | Freq: Two times a day (BID) | ORAL | 3 refills | Status: DC | PRN

## 2022-04-29 MED ORDER — MYCOPHENOLATE MOFETIL 500 MG PO TABS
500.0000 mg | ORAL_TABLET | Freq: Every day | ORAL | 3 refills | Status: DC
Start: 2022-04-29 — End: 2022-07-08

## 2022-04-29 NOTE — Progress Notes (Signed)
Patient: Victor Sutton Date of Birth: 08/12/1928  Reason for Visit: Follow up History from: Patient, wife  Primary Neurologist: Dr. Krista Blue  ASSESSMENT AND PLAN 88 y.o. year old male   1.  Seropositive myasthenia gravis -Diagnosed in 2014, at his worst significant dysarthria, dysphagia, required feeding tube for a few months, responded well to prednisone, tapered off -Continue CellCept 500 mg daily, feels this dosing works best  -Mestinon as needed 60 mg, 1/2 tablet 3 times a day PRN -Follow-up in 6 months with me, call for any worsening symptoms  2.  Worsening fatigue, unsteady gait -Since COVID diagnosis in May 2022 -Excellent benefit with PT -MRI cervical spine showed C3-C4 moderate right foraminal narrowing, some potential to affect right C4 nerve root  3.  Lumbar stenosis -Greatest at L2-L3, L3-L4, potential to affect L3 and L4 nerve roots -Currently no significant back pain, will continue to monitor, encouraged to continue exercise, PT exercise, if pain increases consider referral to pain management  HISTORY  Victor Sutton is a 88 year old male, follow-up for seropositive generalized myasthenia gravis,   I reviewed and summarized the referring note.  Past medical history Hypertension Prostate hypertrophy   Patient's and his family noted as early as 2010, he was noted to have mild gait abnormality, in 2014, he developed facial weakness, significant bulbar weakness, dysarthria, dysphagia, to the point of requiring feeding tube, eventually he was seen by Dr. Jannifer Franklin in July 2014, was diagnosed with seropositive generalized myasthenia gravis  He was treated with prednisone 30 mg daily, Mestinon, began to show significant improvement, later CellCept 500 mg twice daily was added on, since 2014, about 6 months into treatment, he showed significant improvement, then developed bilateral lower extremity weakness, was considered due to high dose of steroid treatment, began steroid  tapering, eventually was able to taper off steroid at the very slow pace in May 2017 without recurrent weakness, CellCept later on was decreased to 500 mg daily, he was able to taper off Mestinon as needed,  Over the years, his symptoms is well controlled, highly functioning, but he suffered COVID in May 2022, reported chest cold-like symptoms, only last for few days  But ever since then, he noticed fatigue, on further questioning, he described overwhelming waves of unsteadiness, tightness sensation over his body, he would feel his legs give out underneath him if he is in a standing position, often helped by sitting down,  At last visit September 2022, he was seen by Jinny Blossom, restarted Mestinon 60 mg half tablets twice daily, patient reported that it did help his symptoms  He also complains of urinary frequency, frequent nocturia, occasionally incontinence, low back pain radiating pain to left hip,  Update April 29, 2022 SS: Acetylcholine receptor binding was positive 4.72 , CK TSH were normal (10/24/21). He did 4 months PT, was helpful, more confidence, balance is better. Yesterday walked 3 miles with daily activities. Is currently taking 500 mg at 4 AM when he gets up to urinate. Tried to take 250 mg twice daily with food, upset his stomach. Want to go back to 500 mg daily. Denies any MG symptoms, occasional staggers. Taking Mestinon 30 mg 2 times daily. Denies any significant back pain. Planning to get back into walking at the [employer].  February 2023 MRI cervical spine showed mild degenerative changes, no evidence of spinal cord compression  MRI lumbar spine showed moderate spinal stenosis at L2-3 level, right greater than left foraminal stenosis  REVIEW OF SYSTEMS: Out of  a complete 14 system review of symptoms, the patient complains only of the following symptoms, and all other reviewed systems are negative.  See HPI  ALLERGIES: Allergies  Allergen Reactions   Ciprofloxacin Anaphylaxis     Avoid due to myasthenia gravis   Quinolones Anaphylaxis    Avoid due to myasthenia gravis    HOME MEDICATIONS: Outpatient Medications Prior to Visit  Medication Sig Dispense Refill   ASCORBIC ACID PO Take 250 mg by mouth 2 (two) times daily.     COVID-19 mRNA bivalent vaccine, Pfizer, (PFIZER COVID-19 VAC BIVALENT) injection Inject into the muscle. 0.3 mL 0   COVID-19 mRNA bivalent vaccine, Pfizer, (PFIZER COVID-19 VAC BIVALENT) injection Inject into the muscle. 0.3 mL 0   hydrochlorothiazide (HYDRODIURIL) 25 MG tablet Take 1 tablet (25 mg total) by mouth daily. 90 tablet 1   Multiple Vitamins-Minerals (ICAPS) CAPS Take 1 capsule by mouth 2 (two) times daily.     pantoprazole (PROTONIX) 40 MG tablet Take 40 mg by mouth daily. May take an additional '40mg'$ s as needed for heartburn     potassium chloride (KLOR-CON) 10 MEQ tablet TAKE 1 TABLET BY MOUTH ONCE DAILY 90 tablet 3   Probiotic Product (ALIGN) 4 MG CAPS take 1 po qd     tamsulosin (FLOMAX) 0.4 MG CAPS capsule 0.4 mg as needed.   3   UNABLE TO FIND Take 1 tablet by mouth daily. Med Name:[employer] - zeaxanthin '10mg'$  and lutein '10mg'$      Vitamin D, Ergocalciferol, (DRISDOL) 50000 UNITS CAPS capsule Take 50,000 Units by mouth every 7 (seven) days. ON FRIDAYS     zoledronic acid (RECLAST) 5 MG/100ML SOLN injection Inject 5 mg into the vein once. YEARLY     mycophenolate (CELLCEPT) 500 MG tablet Take 1 tablet (500 mg total) by mouth daily. 90 tablet 3   pyridostigmine (MESTINON) 60 MG tablet Take 0.5 tablets (30 mg total) by mouth 2 (two) times daily as needed. 150 tablet 3   mycophenolate (CELLCEPT) 250 MG capsule Take 1 capsule (250 mg total) by mouth 2 (two) times daily. (Patient not taking: Reported on 04/29/2022) 180 capsule 4   No facility-administered medications prior to visit.    PAST MEDICAL HISTORY: Past Medical History:  Diagnosis Date   Adenomatous colon polyp    B12 deficiency    [facility]story   Basal cell carcinoma  08/18/2006   BASOSQUAMOUS LEFT OUTER FOREHEAD TX CX3 , EXC   BPH (benign prostatic hyperplasia)    Lysbeth Galas ulcer    Cancer [facility])    precancerous skin lesions on leg    Cataract 2009   bilateral cataract surgery   Delayed gastric emptying    Disturbance of salivary secretion 06/20/2013   Dysphagia, pharyngoesophageal phase 03/16/2013   Dysphonia 03/16/2013   Gait abnormality 11/29/2019   GERD (gastroesophageal reflux disease)    Hiatal hernia    hiatial hernia repair  02/08/13   History of blood transfusion    Hyperlipidemia    not on medication   Hypertension    Iron deficiency anemia    Leukoplakia 2001   Treated for   Macular degeneration    (L) wet, (R) dry   Myasthenia gravis ([place]) 03/21/2013   Peripheral neuropathy    Mild History of   Polyneuritis 1965   Resolved   SCC (squamous cell carcinoma) 03/12/2011   SCC LEFT INNER CHEEK CX3 5FU   SCC (squamous cell carcinoma) 08/24/2012   RIGHT LOWER SHIN TX WITH BX MOHS   SCC (  squamous cell carcinoma) 07/26/2014   LEFT UNDER CHEEK CX3 5FU   SCC (squamous cell carcinoma) 03/12/2021   well diff- right temple (CX35FU)   Squamous cell carcinoma of skin 05/03/2008   SCC INSITU RIGHT V OF CHEST TX EXC   Ulcer    Vitamin D deficiency     PAST SURGICAL HISTORY: Past Surgical History:  Procedure Laterality Date   Arthroscopic knee surgery     left   cateract surgeries      bilateral   CHOLECYSTECTOMY     COLONOSCOPY WITH PROPOFOL N/A 02/16/2017   Procedure: COLONOSCOPY WITH PROPOFOL;  Surgeon: Garlan Fair, MD;  Location: [facility];  Service: Endoscopy;  Laterality: N/A;   GASTROSTOMY W/ FEEDING TUBE  [id number]   HERNIA REPAIR     Laparoscopic ventral   HIATAL HERNIA REPAIR  2004   HIATAL HERNIA REPAIR N/A 02/08/2013   Procedure:  REPAIR OFCURRENT HIATAL HERNIA, ;  Surgeon: Odis Hollingshead, MD;  Location: [facility];  Service: General;  Laterality: N/A;   LAPAROSCOPIC LYSIS OF ADHESIONS N/A 02/08/2013   Procedure:  LAPAROSCOPIC LYSIS OF ADHESIONS;  Surgeon: Odis Hollingshead, MD;  Location: [facility];  Service: General;  Laterality: N/A;   LAPAROSCOPIC NISSEN FUNDOPLICATION  9/62/9528   Procedure: LAPAROSCOPIC NISSEN FUNDOPLICATION;  Surgeon: Odis Hollingshead, MD;  Location: [facility];  Service: General;;   Multiple oral surgeries  2002   SKIN CANCER EXCISION  10/2012   right leg-shin area   STOMACH SURGERY  2005   states his stomach had moved up toward his chest , some type of surgery performed   TONSILLECTOMY     UPPER GI ENDOSCOPY N/A 02/08/2013   Procedure: UPPER GI ENDOSCOPY;  Surgeon: Odis Hollingshead, MD;  Location: [facility];  Service: General;  Laterality: N/A;    FAMILY HISTORY: Family History  Problem Relation Age of Onset   Diabetes Father    Heart attack Father    Breast cancer Sister    CAD Sister    COPD Sister    Prostate cancer Paternal Grandfather    Colon cancer Son 33   Myasthenia gravis Son    Colon cancer Son 30    SOCIAL HISTORY: Social History   Socioeconomic History   Marital status: Married    Spouse name: [name]   Number of children: 3   Years of education: 16   Highest education level: Not on file  Occupational History   Occupation: Retired    Fish farm manager: RETIRED  Tobacco Use   Smoking status: Former    Types: Cigarettes    Quit date: 09/15/1976    Years since quitting: 45.6   Smokeless tobacco: Never   Tobacco comments:    Quit 1980  Vaping Use   Vaping Use: Never used  Substance and Sexual Activity   Alcohol use: No    Comment: Moderate alcohol, wine   Drug use: No   Sexual activity: Not on file  Other Topics Concern   Not on file  Social History Narrative   Lives at home, married   Patient is right-handed.   Patient drinks caffeine occasionally.      Social Determinants of Health   Financial Resource Strain: Not on file  Food Insecurity: Not on file  Transportation Needs: Not on file  Physical Activity: Not on file  Stress: Not on file   Social Connections: Not on file  Intimate Partner Violence: Not on file   PHYSICAL EXAM  Vitals:  04/29/22 1044  BP: 129/66  Pulse: 73  Weight: 159 lb (72.1 kg)  Height: '5\' 9"'$  (1.753 m)   Body mass index is 23.48 kg/m.  Generalized: Well developed, in no acute distress, elderly male Neurological examination  Mentation: Alert oriented to time, place, history taking. Follows all commands speech and language fluent Cranial nerve II-XII: Pupils were equal round reactive to light. Extraocular movements were full, visual field were full on confrontational test. Facial sensation and strength were normal.  Head turning and shoulder shrug  were normal and symmetric.  No eye open or cheek puff weakness noted. Motor: The motor testing reveals 5 over 5 strength of all 4 extremities. Good symmetric motor tone is noted throughout.  No neck weakness noted. Sensory: Sensory testing is intact to soft touch on all 4 extremities. No evidence of extinction is noted.  Coordination: Cerebellar testing reveals good finger-nose-finger and heel-to-shin bilaterally.  Gait and station: Has to push off from seated position to stand, gait is slightly wide-based, cautious, but independent Reflexes: Deep tendon reflexes are symmetric and normal bilaterally   DIAGNOSTIC DATA (LABS, IMAGING, TESTING) - I reviewed patient records, labs, notes, testing and imaging myself where available.  Lab Results  Component Value Date   WBC 5.3 06/05/2021   HGB 14.7 06/05/2021   HCT 43.1 06/05/2021   MCV 94 06/05/2021   PLT 237 06/05/2021      Component Value Date/Time   NA 134 06/05/2021 1115   K 4.6 06/05/2021 1115   CL 95 (L) 06/05/2021 1115   CO2 25 06/05/2021 1115   GLUCOSE 92 06/05/2021 1115   GLUCOSE 105 (H) 02/11/2014 1131   BUN 14 06/05/2021 1115   CREATININE 0.77 06/05/2021 1115   CALCIUM 9.3 06/05/2021 1115   PROT 6.5 06/05/2021 1115   ALBUMIN 3.9 06/05/2021 1115   AST 28 06/05/2021 1115   ALT 12  06/05/2021 1115   ALKPHOS 51 06/05/2021 1115   BILITOT 1.2 06/05/2021 1115   GFRNONAA 78 06/04/2020 0908   GFRAA 90 06/04/2020 0908   Lab Results  Component Value Date   CHOL 118 02/14/2013   TRIG 79 02/21/2013   No results found for: "HGBA1C" Lab Results  Component Value Date   VITAMINB12 1,057 12/03/2020   Lab Results  Component Value Date   TSH 1.900 10/24/2021   Butler Denmark, AGNP-C, DNP 04/29/2022, 11:11 AM [facility] [address], [place], [place] [address] [phone]

## 2022-04-29 NOTE — Patient Instructions (Signed)
Meds ordered this encounter  Medications   mycophenolate (CELLCEPT) 500 MG tablet    Sig: Take 1 tablet (500 mg total) by mouth daily.    Dispense:  90 tablet    Refill:  3   pyridostigmine (MESTINON) 60 MG tablet    Sig: Take 0.5 tablets (30 mg total) by mouth 2 (two) times daily as needed.    Dispense:  150 tablet    Refill:  3   Call for any worsening symptoms  See you back in 6 months

## 2022-05-13 DIAGNOSIS — H524 Presbyopia: Secondary | ICD-10-CM | POA: Diagnosis not present

## 2022-06-12 DIAGNOSIS — N401 Enlarged prostate with lower urinary tract symptoms: Secondary | ICD-10-CM | POA: Diagnosis not present

## 2022-06-12 DIAGNOSIS — R351 Nocturia: Secondary | ICD-10-CM | POA: Diagnosis not present

## 2022-07-07 NOTE — Progress Notes (Signed)
Cardiology Clinic Note   Patient Name: Victor Sutton Date of Encounter: 07/08/2022  Primary Care Provider:  Ginger Organ., MD Primary Cardiologist:  Donato Heinz, MD  Patient Profile    Victor Sutton 88 year old male presents to the clinic today for follow-up evaluation of his atrial fibrillation.  Past Medical History    Past Medical History:  Diagnosis Date   Adenomatous colon polyp    B12 deficiency    HIstory   Basal cell carcinoma 08/18/2006   BASOSQUAMOUS LEFT OUTER FOREHEAD TX CX3 , EXC   BPH (benign prostatic hyperplasia)    Lysbeth Galas ulcer    Cancer [facility])    precancerous skin lesions on leg    Cataract 2009   bilateral cataract surgery   Delayed gastric emptying    Disturbance of salivary secretion 06/20/2013   Dysphagia, pharyngoesophageal phase 03/16/2013   Dysphonia 03/16/2013   Gait abnormality 11/29/2019   GERD (gastroesophageal reflux disease)    Hiatal hernia    hiatial hernia repair  02/08/13   History of blood transfusion    Hyperlipidemia    not on medication   Hypertension    Iron deficiency anemia    Leukoplakia 2001   Treated for   Macular degeneration    (L) wet, (R) dry   Myasthenia gravis ([place]) 03/21/2013   Peripheral neuropathy    Mild History of   Polyneuritis 1965   Resolved   SCC (squamous cell carcinoma) 03/12/2011   SCC LEFT INNER CHEEK CX3 5FU   SCC (squamous cell carcinoma) 08/24/2012   RIGHT LOWER SHIN TX WITH BX MOHS   SCC (squamous cell carcinoma) 07/26/2014   LEFT UNDER CHEEK CX3 5FU   SCC (squamous cell carcinoma) 03/12/2021   well diff- right temple (CX35FU)   Squamous cell carcinoma of skin 05/03/2008   SCC INSITU RIGHT V OF CHEST TX EXC   Ulcer    Vitamin D deficiency    Past Surgical History:  Procedure Laterality Date   Arthroscopic knee surgery     left   cateract surgeries      bilateral   CHOLECYSTECTOMY     COLONOSCOPY WITH PROPOFOL N/A 02/16/2017   Procedure: COLONOSCOPY WITH  PROPOFOL;  Surgeon: Garlan Fair, MD;  Location: [facility];  Service: Endoscopy;  Laterality: N/A;   GASTROSTOMY W/ FEEDING TUBE  [id number]   HERNIA REPAIR     Laparoscopic ventral   HIATAL HERNIA REPAIR  2004   HIATAL HERNIA REPAIR N/A 02/08/2013   Procedure:  REPAIR OFCURRENT HIATAL HERNIA, ;  Surgeon: Odis Hollingshead, MD;  Location: [facility];  Service: General;  Laterality: N/A;   LAPAROSCOPIC LYSIS OF ADHESIONS N/A 02/08/2013   Procedure: LAPAROSCOPIC LYSIS OF ADHESIONS;  Surgeon: Odis Hollingshead, MD;  Location: [facility];  Service: General;  Laterality: N/A;   LAPAROSCOPIC NISSEN FUNDOPLICATION  0/62/3762   Procedure: LAPAROSCOPIC NISSEN FUNDOPLICATION;  Surgeon: Odis Hollingshead, MD;  Location: [facility];  Service: General;;   Multiple oral surgeries  2002   SKIN CANCER EXCISION  10/2012   right leg-shin area   STOMACH SURGERY  2005   states his stomach had moved up toward his chest , some type of surgery performed   TONSILLECTOMY     UPPER GI ENDOSCOPY N/A 02/08/2013   Procedure: UPPER GI ENDOSCOPY;  Surgeon: Odis Hollingshead, MD;  Location: [facility];  Service: General;  Laterality: N/A;    Allergies  Allergies  Allergen Reactions  Ciprofloxacin Anaphylaxis    Avoid due to myasthenia gravis   Quinolones Anaphylaxis    Avoid due to myasthenia gravis    History of Present Illness    Victor Sutton is a PMH of atrial fibrillation, dysphagia, GERD, Bell's palsy, BPH, macular degeneration, is status post cholecystectomy, gait abnormality, aspiration, HTN, and aortic valve stenosis and COVID-19 infection.  Echocardiogram 12/12/2020 showed normal biventricular function, mild LVH, G1 DD, mild aortic stenosis.  His cardiac event monitor for 2022 showed frequent PACs, occasional PVCs, and 6 episodes of SVT with the longest lasting 14 beats.  He was seen in follow-up by Dr. Gardiner Rhyme on 02/26/2021.  During that time he reported that he had been doing okay.  He reported having a COVID  infection in April.  He noted upper respiratory infection symptoms but did well.  He did not require hospitalization.  He did report some dyspnea but denied chest pain.  He denied palpitations.  His main issue was with feeling fatigued.  He had been started on a course of prednisone for his myasthenia and reported feeling somewhat improved.  He presents to the clinic today for follow-up evaluation and states he has noticed increased work of breathing and fatigue.  His increased work of breathing started about 4 to 5 months ago.  He continues to be somewhat physically active walking around 2 miles per day which she tracks on his smart device.  He also notices intermittent periods of heavy heartbeat that are brief and go away with rest.  He continues to have episodes of extreme fatigue which are relieved with about 10 minutes of rest.  We reviewed his history of myasthenia gravis.  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  I reviewed echocardiogram and how the exam may help to provide further insight into his symptoms.  He and his wife wish to proceed with the study.  He recently had lab work drawn with his PCP.  It was unremarkable.  I will have him follow-up around 1-1/2 weeks.  Today he denies chest pain,  fatigue, melena, hematuria, hemoptysis, diaphoresis, weakness, presyncope, syncope, orthopnea, and PND.   Home Medications    Prior to Admission medications   Medication Sig Start Date End Date Taking? Authorizing Provider  ASCORBIC ACID PO Take 250 mg by mouth 2 (two) times daily.    [provider]  COVID-19 mRNA bivalent vaccine, Pfizer, (PFIZER COVID-19 VAC BIVALENT) injection Inject into the muscle. 06/11/21   Carlyle Basques, MD  COVID-19 mRNA bivalent vaccine, Pfizer, (PFIZER COVID-19 VAC BIVALENT) injection Inject into the muscle. 01/16/22   Carlyle Basques, MD  hydrochlorothiazide (HYDRODIURIL) 25 MG tablet Take 1 tablet (25 mg  total) by mouth daily. 04/07/16   Kathrynn Ducking, MD  Multiple Vitamins-Minerals (ICAPS) CAPS Take 1 capsule by mouth 2 (two) times daily.    [provider]  mycophenolate (CELLCEPT) 500 MG tablet Take 1 tablet (500 mg total) by mouth daily. 04/29/22   Suzzanne Cloud, NP  pantoprazole (PROTONIX) 40 MG tablet Take 40 mg by mouth daily. May take an additional '40mg'$ s as needed for heartburn 09/27/13   [provider]  potassium chloride (KLOR-CON) 10 MEQ tablet TAKE 1 TABLET BY MOUTH ONCE DAILY 03/14/21   Kathrynn Ducking, MD  Probiotic Product (ALIGN) 4 MG CAPS take 1 po qd 10/16/17   [provider]  pyridostigmine (MESTINON) 60 MG tablet Take 0.5 tablets (30 mg total) by mouth 2 (two)  times daily as needed. 04/29/22   Suzzanne Cloud, NP  tamsulosin (FLOMAX) 0.4 MG CAPS capsule 0.4 mg as needed.  10/24/17   [provider]  UNABLE TO FIND Take 1 tablet by mouth daily. Med Name:EyePromise - zeaxanthin '10mg'$  and lutein '10mg'$     [provider]  Vitamin D, Ergocalciferol, (DRISDOL) 50000 UNITS CAPS capsule Take 50,000 Units by mouth every 7 (seven) days. ON FRIDAYS    [provider]  zoledronic acid (RECLAST) 5 MG/100ML SOLN injection Inject 5 mg into the vein once. YEARLY    [provider]    Family History    Family History  Problem Relation Age of Onset   Diabetes Father    Heart attack Father    Breast cancer Sister    CAD Sister    COPD Sister    Prostate cancer Paternal Grandfather    Colon cancer Son 23   Myasthenia gravis Son    Colon cancer Son 41   He indicated that his mother is deceased. He indicated that his father is deceased. He indicated that only one of his two sisters is alive. He indicated that the status of his paternal grandfather is unknown. He indicated that only one of his two sons is alive.  Social History    Social History   Socioeconomic History   Marital status: Married    Spouse name: [name]    Number of children: 3   Years of education: 16   Highest education level: Not on file  Occupational History   Occupation: Retired    Fish farm manager: RETIRED  Tobacco Use   Smoking status: Former    Types: Cigarettes    Quit date: 09/15/1976    Years since quitting: 45.8   Smokeless tobacco: Never   Tobacco comments:    Quit 1980  Vaping Use   Vaping Use: Never used  Substance and Sexual Activity   Alcohol use: No    Comment: Moderate alcohol, wine   Drug use: No   Sexual activity: Not on file  Other Topics Concern   Not on file  Social History Narrative   Lives at home, married   Patient is right-handed.   Patient drinks caffeine occasionally.      Social Determinants of Health   Financial Resource Strain: Not on file  Food Insecurity: Not on file  Transportation Needs: Not on file  Physical Activity: Not on file  Stress: Not on file  Social Connections: Not on file  Intimate Partner Violence: Not on file     Review of Systems    General:  No chills, fever, night sweats or weight changes.  Cardiovascular:  No chest pain, dyspnea on exertion, edema, orthopnea, palpitations, paroxysmal nocturnal dyspnea. Dermatological: No rash, lesions/masses Respiratory: No cough, dyspnea Urologic: No hematuria, dysuria Abdominal:   No nausea, vomiting, diarrhea, bright red blood per rectum, melena, or hematemesis Neurologic:  No visual changes, wkns, changes in mental status. All other systems reviewed and are otherwise negative except as noted above.  Physical Exam    VS:  BP 128/70   Pulse 78   Ht '5\' 9"'$  (1.753 m)   Wt 159 lb 9.6 oz (72.4 kg)   SpO2 99%   BMI 23.57 kg/m  , BMI Body mass index is 23.57 kg/m. GEN: Well nourished, well developed, in no acute distress. HEENT: normal. Neck: Supple, no JVD, carotid bruits, or masses. Cardiac: RRR, no murmurs, rubs, or gallops. No clubbing, cyanosis, slight ankle edema.  Radials/DP/PT 2+ and equal bilaterally.  Respiratory:   Respirations regular and unlabored, clear to auscultation bilaterally. GI: Soft, nontender, nondistended, BS + x 4. MS: no deformity or atrophy. Skin: warm and dry, no rash. Neuro:  Strength and sensation are intact. Psych: Normal affect.  Accessory Clinical Findings    Recent Labs: 10/24/2021: TSH 1.900   Recent Lipid Panel    Component Value Date/Time   CHOL 118 02/14/2013 0530   TRIG 79 02/21/2013 0520         ECG personally reviewed by me today-normal sinus rhythm no ectopy 78 bpm- No acute changes  Echocardiogram 12/12/2020 IMPRESSIONS     1. Left ventricular ejection fraction, by estimation, is 60 to 65%. The  left ventricle has normal function. The left ventricle has no regional  wall motion abnormalities. There is mild left ventricular hypertrophy.  Left ventricular diastolic parameters  are consistent with Grade I diastolic dysfunction (impaired relaxation).  The average left ventricular global longitudinal strain is -22.2 %. The  global longitudinal strain is normal.   2. Right ventricular systolic function is normal. The right ventricular  size is normal.   3. The mitral valve is normal in structure. No evidence of mitral valve  regurgitation. No evidence of mitral stenosis.   4. The aortic valve is calcified. There is mild calcification of the  aortic valve. There is mild thickening of the aortic valve. Aortic valve  regurgitation is not visualized. Mild aortic valve stenosis. Aortic valve  area, by VTI measures 1.93 cm.  Aortic valve mean gradient measures 14.0 mmHg. Aortic valve Vmax measures  2.56 m/s.   5. Aortic dilatation noted. There is mild dilatation of the aortic root,  measuring 37 mm.   6. The inferior vena cava is normal in size with greater than 50%  respiratory variability, suggesting right atrial pressure of 3 mmHg.   Comparison(s): 02/09/13 EF 55-60%.   Cardiac event monitor 01/02/2021  Frequent PACs (6.4%) Occasional PVCs (1.7%). 6  episodes of SVT, longest lasting 14 beats     Patch Wear Time:  13 days and 23 hours (2022-03-31T11:36:41-0400 to 2022-04-14T11:19:58-0400)   Patient had a min HR of 44 bpm, max HR of 160 bpm, and avg HR of 75 bpm. Predominant underlying rhythm was Sinus Rhythm. 6 Supraventricular Tachycardia runs occurred, the run with the fastest interval lasting 10 beats with a max rate of 160 bpm, the  longest lasting 14 beats with an avg rate of 122 bpm. Isolated SVEs were frequent (6.4%, [id number]), SVE Couplets were rare (<1.0%, 432), and SVE Triplets were rare (<1.0%, 15). Isolated VEs were occasional (1.7%, [id number]), VE Couplets were rare (<1.0%, 396),  and VE Triplets were rare (<1.0%, 17). Ventricular Bigeminy and Trigeminy were present. Difficulty discerning atrial activity making definitive diagnosis difficult to ascertain. Assessment & Plan   1.  Atrial flutter-EKG today shows normal sinus rhythm 78 bpm.  Denies episodes of irregular heartbeat or accelerated heart rate.  Previous cardiac event monitor showed 6 episodes of SVT with the longest being 14 beats.  Details above.  CHA2DS2-VASc score 3.  Patient previously not started on anticoagulation.  No identified recurrence of atrial flutter. Continue to monitor Avoid triggers caffeine, chocolate, EtOH, dehydration etc. Increase physical activity as tolerated  DOE, fatigue-has been present for the last 4-5 months.  Does note some poor appetite.  Previously diagnosed with myasthenia gravis.  Has been walking around 2 miles per day which she tracks with a smart device.  Recent lab work  unremarkable Order echocardiogram  Essential hypertension-BP today 128/70.  Well-controlled at home. Continue HCTZ Heart healthy low-sodium diet-salty 6 given Increase physical activity as tolerated   Aortic valve stenosis-no increased DOE or activity intolerance.  Somewhat physically active.  Echocardiogram 3/22 showed mild aortic stenosis. Will defer reevaluation at  this time.   Disposition: Follow-up with Dr. Gardiner Rhyme or me in 1-2 months.  Jossie Ng. Tanairi Cypert NP-C     07/08/2022, 10:59 AM [place] [address] Office [phone] Fax [phone]  Notice: This dictation was prepared with Dragon dictation along with smaller phrase technology. Any transcriptional errors that result from this process are unintentional and may not be corrected upon review.  I spent 14 minutes examining this patient, reviewing medications, and using patient centered shared decision making involving her cardiac care.  Prior to her visit I spent greater than 20 minutes reviewing her past medical history,  medications, and prior cardiac tests.

## 2022-07-08 ENCOUNTER — Encounter: Payer: Self-pay | Admitting: General Practice

## 2022-07-08 ENCOUNTER — Ambulatory Visit: Payer: Medicare Other | Attending: General Practice | Admitting: General Practice

## 2022-07-08 VITALS — BP 128/70 | HR 78 | Ht 69.0 in | Wt 159.6 lb

## 2022-07-08 DIAGNOSIS — I10 Essential (primary) hypertension: Secondary | ICD-10-CM | POA: Diagnosis not present

## 2022-07-08 DIAGNOSIS — I350 Nonrheumatic aortic (valve) stenosis: Secondary | ICD-10-CM

## 2022-07-08 DIAGNOSIS — I4892 Unspecified atrial flutter: Secondary | ICD-10-CM

## 2022-07-08 DIAGNOSIS — R0602 Shortness of breath: Secondary | ICD-10-CM | POA: Diagnosis not present

## 2022-07-08 DIAGNOSIS — R0609 Other forms of dyspnea: Secondary | ICD-10-CM

## 2022-07-08 NOTE — Patient Instructions (Signed)
Medication Instructions:  The current medical regimen is effective;  continue present plan and medications as directed. Please refer to the Current Medication list given to you today.  *If you need a refill on your cardiac medications before your next appointment, please call your pharmacy*   Lab Work: NONE  Testing/Procedures: Echocardiogram - Your physician has requested that you have an echocardiogram. Echocardiography is a painless test that uses sound waves to create images of your heart. It provides your doctor with information about the size and shape of your heart and how well your heart's chambers and valves are working. This procedure takes approximately one hour. There are no restrictions for this procedure.   Follow-Up: At [facility], you and your health needs are our priority.  As part of our continuing mission to provide you with exceptional heart care, we have created designated Provider Care Teams.  These Care Teams include your primary Cardiologist (physician) and Advanced Practice Providers (APPs -  Physician Assistants and Nurse Practitioners) who all work together to provide you with the care you need, when you need it.  Your next appointment:   6-8 week(s)  The format for your next appointment:   In Person  Provider:   Donato Heinz, MD  or Coletta Memos, FNP        Other Instructions MAINTAIN PHYSICAL ACTIVITY   Important Information About Sugar

## 2022-07-17 ENCOUNTER — Other Ambulatory Visit: Payer: Self-pay | Admitting: Internal Medicine

## 2022-07-17 DIAGNOSIS — K862 Cyst of pancreas: Secondary | ICD-10-CM

## 2022-07-24 ENCOUNTER — Ambulatory Visit (HOSPITAL_COMMUNITY): Payer: Medicare Other | Attending: General Practice

## 2022-07-24 DIAGNOSIS — R0609 Other forms of dyspnea: Secondary | ICD-10-CM | POA: Insufficient documentation

## 2022-07-24 DIAGNOSIS — R0602 Shortness of breath: Secondary | ICD-10-CM | POA: Diagnosis not present

## 2022-07-24 LAB — ECHOCARDIOGRAM COMPLETE
AR max vel: 1.29 cm2
AV Area VTI: 1.23 cm2
AV Area mean vel: 1.29 cm2
AV Mean grad: 17.6 mmHg
AV Peak grad: 31.3 mmHg
Ao pk vel: 2.8 m/s
S' Lateral: 3.2 cm

## 2022-08-13 ENCOUNTER — Ambulatory Visit
Admission: RE | Admit: 2022-08-13 | Discharge: 2022-08-13 | Disposition: A | Discharge status: Home / Self Care | Payer: Medicare Other | Source: Ambulatory Visit | Attending: Internal Medicine | Admitting: Internal Medicine

## 2022-08-13 DIAGNOSIS — K862 Cyst of pancreas: Secondary | ICD-10-CM

## 2022-08-13 MED ORDER — GADOPICLENOL 0.5 MMOL/ML IV SOLN
7.5000 mL | Freq: Once | INTRAVENOUS | Status: AC | PRN
  Administered 2022-08-13: 7.5 mL via INTRAVENOUS

## 2022-08-15 ENCOUNTER — Other Ambulatory Visit: Payer: Self-pay

## 2022-08-15 MED ORDER — COVID-19 MRNA VAC-TRIS(PFIZER) 30 MCG/0.3ML IM SUSY
0.3000 mL | PREFILLED_SYRINGE | Freq: Once | INTRAMUSCULAR | 0 refills | Status: AC
  Filled 2022-08-20 – 2022-10-08 (×4): qty 0.3, 1d supply, fill #0

## 2022-08-20 ENCOUNTER — Other Ambulatory Visit: Payer: Self-pay

## 2022-08-20 ENCOUNTER — Ambulatory Visit: Payer: Medicare Other | Admitting: General Practice

## 2022-08-21 ENCOUNTER — Other Ambulatory Visit: Payer: Self-pay

## 2022-08-25 ENCOUNTER — Other Ambulatory Visit: Payer: Self-pay

## 2022-08-25 NOTE — Progress Notes (Unsigned)
Cardiology Clinic Note   Patient Name: NYQUAN SELBE Date of Encounter: 08/26/2022  Primary Care Provider:  Ginger Organ., MD Primary Cardiologist:  Donato Heinz, MD  Patient Profile    [name] 88 year old male presents to the clinic today for follow-up evaluation of his atrial fibrillation.  Past Medical History    Past Medical History:  Diagnosis Date   Adenomatous colon polyp    B12 deficiency    HIstory   Basal cell carcinoma 08/18/2006   BASOSQUAMOUS LEFT OUTER FOREHEAD TX CX3 , EXC   BPH (benign prostatic hyperplasia)    Lysbeth Galas ulcer    Cancer [facility])    precancerous skin lesions on leg    Cataract 2009   bilateral cataract surgery   Delayed gastric emptying    Disturbance of salivary secretion 06/20/2013   Dysphagia, pharyngoesophageal phase 03/16/2013   Dysphonia 03/16/2013   Gait abnormality 11/29/2019   GERD (gastroesophageal reflux disease)    Hiatal hernia    hiatial hernia repair  02/08/13   History of blood transfusion    Hyperlipidemia    not on medication   Hypertension    Iron deficiency anemia    Leukoplakia 2001   Treated for   Macular degeneration    (L) wet, (R) dry   Myasthenia gravis ([place]) 03/21/2013   Peripheral neuropathy    Mild History of   Polyneuritis 1965   Resolved   SCC (squamous cell carcinoma) 03/12/2011   SCC LEFT INNER CHEEK CX3 5FU   SCC (squamous cell carcinoma) 08/24/2012   RIGHT LOWER SHIN TX WITH BX MOHS   SCC (squamous cell carcinoma) 07/26/2014   LEFT UNDER CHEEK CX3 5FU   SCC (squamous cell carcinoma) 03/12/2021   well diff- right temple (CX35FU)   Squamous cell carcinoma of skin 05/03/2008   SCC INSITU RIGHT V OF CHEST TX EXC   Ulcer    Vitamin D deficiency    Past Surgical History:  Procedure Laterality Date   Arthroscopic knee surgery     left   cateract surgeries      bilateral   CHOLECYSTECTOMY     COLONOSCOPY WITH PROPOFOL N/A 02/16/2017   Procedure: COLONOSCOPY WITH  PROPOFOL;  Surgeon: Garlan Fair, MD;  Location: [facility];  Service: Endoscopy;  Laterality: N/A;   GASTROSTOMY W/ FEEDING TUBE  [id number]   HERNIA REPAIR     Laparoscopic ventral   HIATAL HERNIA REPAIR  2004   HIATAL HERNIA REPAIR N/A 02/08/2013   Procedure:  REPAIR OFCURRENT HIATAL HERNIA, ;  Surgeon: Odis Hollingshead, MD;  Location: [facility];  Service: General;  Laterality: N/A;   LAPAROSCOPIC LYSIS OF ADHESIONS N/A 02/08/2013   Procedure: LAPAROSCOPIC LYSIS OF ADHESIONS;  Surgeon: Odis Hollingshead, MD;  Location: [facility];  Service: General;  Laterality: N/A;   LAPAROSCOPIC NISSEN FUNDOPLICATION  03/17/5008   Procedure: LAPAROSCOPIC NISSEN FUNDOPLICATION;  Surgeon: Odis Hollingshead, MD;  Location: [facility];  Service: General;;   Multiple oral surgeries  2002   SKIN CANCER EXCISION  10/2012   right leg-shin area   STOMACH SURGERY  2005   states his stomach had moved up toward his chest , some type of surgery performed   TONSILLECTOMY     UPPER GI ENDOSCOPY N/A 02/08/2013   Procedure: UPPER GI ENDOSCOPY;  Surgeon: Odis Hollingshead, MD;  Location: [facility];  Service: General;  Laterality: N/A;    Allergies  Allergies  Allergen Reactions  Ciprofloxacin Anaphylaxis    Avoid due to myasthenia gravis   Quinolones Anaphylaxis    Avoid due to myasthenia gravis    History of Present Illness    NICKALOS PETERSEN is a PMH of atrial fibrillation, dysphagia, GERD, Bell's palsy, BPH, macular degeneration, is status post cholecystectomy, gait abnormality, aspiration, HTN, and aortic valve stenosis and COVID-19 infection.  Echocardiogram 12/12/2020 showed normal biventricular function, mild LVH, G1 DD, mild aortic stenosis.  His cardiac event monitor for 2022 showed frequent PACs, occasional PVCs, and 6 episodes of SVT with the longest lasting 14 beats.  He was seen in follow-up by Dr. Gardiner Rhyme on 02/26/2021.  During that time he reported that he had been doing okay.  He reported having a COVID  infection in April.  He noted upper respiratory infection symptoms but did well.  He did not require hospitalization.  He did report some dyspnea but denied chest pain.  He denied palpitations.  His main issue was with feeling fatigued.  He had been started on a course of prednisone for his myasthenia and reported feeling somewhat improved.  He presents to the clinic today for follow-up evaluation and states he has noticed increased work of breathing and fatigue.  His increased work of breathing started about 4 to 5 months ago.  He continues to be somewhat physically active walking around 2 miles per day which she tracks on his smart device.  He also notices intermittent periods of heavy heartbeat that are brief and go away with rest.  He continues to have episodes of extreme fatigue which are relieved with about 10 minutes of rest.  We reviewed his history of myasthenia gravis.  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  I reviewed echocardiogram and how the exam may help to provide further insight into his symptoms.  He and his wife wish to proceed with the study.  He recently had lab work drawn with his PCP.  It was unremarkable.  I will have him follow-up around 1-1/2 weeks.  His follow-up echocardiogram was reassuring 07/24/2022.  He presents to the clinic today for follow-up evaluation and states he feels well.  He and his wife been less active during the holidays.  They not excited to spend the holidays with her son.  We reviewed that it is echocardiogram and he expressed understanding.  His blood pressure is well-controlled today at 130-132/70.  He does have right ankle edema which dissipates throughout the day with elevation.  I will continue his current medication regimen.  We will give him a salty 6 diet sheet and plan follow-up in 6 months.  Today he denies chest pain,  fatigue, melena, hematuria, hemoptysis, diaphoresis, weakness, presyncope,  syncope, orthopnea, and PND.   Home Medications    Prior to Admission medications   Medication Sig Start Date End Date Taking? Authorizing Provider  ASCORBIC ACID PO Take 250 mg by mouth 2 (two) times daily.    [provider]  COVID-19 mRNA bivalent vaccine, Pfizer, (PFIZER COVID-19 VAC BIVALENT) injection Inject into the muscle. 06/11/21   Carlyle Basques, MD  COVID-19 mRNA bivalent vaccine, Pfizer, (PFIZER COVID-19 VAC BIVALENT) injection Inject into the muscle. 01/16/22   Carlyle Basques, MD  hydrochlorothiazide (HYDRODIURIL) 25 MG tablet Take 1 tablet (25 mg total) by mouth daily. 04/07/16   Kathrynn Ducking, MD  Multiple Vitamins-Minerals (ICAPS) CAPS Take 1 capsule by mouth 2 (two) times daily.    [provider]  mycophenolate (CELLCEPT) 500 MG tablet Take 1 tablet (500 mg total) by mouth daily. 04/29/22   Suzzanne Cloud, NP  pantoprazole (PROTONIX) 40 MG tablet Take 40 mg by mouth daily. May take an additional '40mg'$ s as needed for heartburn 09/27/13   [provider]  potassium chloride (KLOR-CON) 10 MEQ tablet TAKE 1 TABLET BY MOUTH ONCE DAILY 03/14/21   Kathrynn Ducking, MD  Probiotic Product (ALIGN) 4 MG CAPS take 1 po qd 10/16/17   [provider]  pyridostigmine (MESTINON) 60 MG tablet Take 0.5 tablets (30 mg total) by mouth 2 (two) times daily as needed. 04/29/22   Suzzanne Cloud, NP  tamsulosin (FLOMAX) 0.4 MG CAPS capsule 0.4 mg as needed.  10/24/17   [provider]  UNABLE TO FIND Take 1 tablet by mouth daily. Med Name:EyePromise - zeaxanthin '10mg'$  and lutein '10mg'$     [provider]  Vitamin D, Ergocalciferol, (DRISDOL) 50000 UNITS CAPS capsule Take 50,000 Units by mouth every 7 (seven) days. ON FRIDAYS    [provider]  zoledronic acid (RECLAST) 5 MG/100ML SOLN injection Inject 5 mg into the vein once. YEARLY    [provider]    Family History    Family History  Problem Relation Age of Onset   Diabetes  Father    Heart attack Father    Breast cancer Sister    CAD Sister    COPD Sister    Prostate cancer Paternal Grandfather    Colon cancer Son 59   Myasthenia gravis Son    Colon cancer Son 47   He indicated that his mother is deceased. He indicated that his father is deceased. He indicated that only one of his two sisters is alive. He indicated that the status of his paternal grandfather is unknown. He indicated that only one of his two sons is alive.  Social History    Social History   Socioeconomic History   Marital status: Married    Spouse name: [name]   Number of children: 3   Years of education: 16   Highest education level: Not on file  Occupational History   Occupation: Retired    Fish farm manager: RETIRED  Tobacco Use   Smoking status: Former    Types: Cigarettes    Quit date: 09/15/1976    Years since quitting: 45.9   Smokeless tobacco: Never   Tobacco comments:    Quit 1980  Vaping Use   Vaping Use: Never used  Substance and Sexual Activity   Alcohol use: No    Comment: Moderate alcohol, wine   Drug use: No   Sexual activity: Not on file  Other Topics Concern   Not on file  Social History Narrative   Lives at home, married   Patient is right-handed.   Patient drinks caffeine occasionally.      Social Determinants of Health   Financial Resource Strain: Not on file  Food Insecurity: Not on file  Transportation Needs: Not on file  Physical Activity: Not on file  Stress: Not on file  Social Connections: Not on file  Intimate Partner Violence: Not on file     Review of Systems    General:  No chills, fever, night sweats or weight changes.  Cardiovascular:  No chest pain, dyspnea on exertion, edema, orthopnea, palpitations, paroxysmal nocturnal dyspnea. Dermatological: No rash, lesions/masses Respiratory: No cough, dyspnea Urologic: No hematuria, dysuria Abdominal:   No nausea, vomiting, diarrhea, bright red blood per rectum, melena, or  hematemesis  Neurologic:  No visual changes, wkns, changes in mental status. All other systems reviewed and are otherwise negative except as noted above.  Physical Exam    VS:  BP 132/70 (BP Location: Left Arm, Patient Position: Sitting, Cuff Size: Normal)   Pulse 78   Ht '5\' 9"'$  (1.753 m)   Wt 160 lb 12.8 oz (72.9 kg)   SpO2 99%   BMI 23.75 kg/m  , BMI Body mass index is 23.75 kg/m. GEN: Well nourished, well developed, in no acute distress. HEENT: normal. Neck: Supple, no JVD, carotid bruits, or masses. Cardiac: RRR, 2/6 systolic murmur heard best along right sternal border, rubs, or gallops. No clubbing, cyanosis, slight ankle edema right greater than left.  Radials/DP/PT 2+ and equal bilaterally.  Respiratory:  Respirations regular and unlabored, clear to auscultation bilaterally. GI: Soft, nontender, nondistended, BS + x 4. MS: no deformity or atrophy. Skin: warm and dry, no rash. Neuro:  Strength and sensation are intact. Psych: Normal affect.  Accessory Clinical Findings    Recent Labs: 10/24/2021: TSH 1.900   Recent Lipid Panel    Component Value Date/Time   CHOL 118 02/14/2013 0530   TRIG 79 02/21/2013 0520         ECG personally reviewed by me today-none today.  EKG 07/08/2022 normal sinus rhythm no ectopy 78 bpm- No acute changes  Echocardiogram 12/12/2020 IMPRESSIONS     1. Left ventricular ejection fraction, by estimation, is 60 to 65%. The  left ventricle has normal function. The left ventricle has no regional  wall motion abnormalities. There is mild left ventricular hypertrophy.  Left ventricular diastolic parameters  are consistent with Grade I diastolic dysfunction (impaired relaxation).  The average left ventricular global longitudinal strain is -22.2 %. The  global longitudinal strain is normal.   2. Right ventricular systolic function is normal. The right ventricular  size is normal.   3. The mitral valve is normal in structure. No evidence of  mitral valve  regurgitation. No evidence of mitral stenosis.   4. The aortic valve is calcified. There is mild calcification of the  aortic valve. There is mild thickening of the aortic valve. Aortic valve  regurgitation is not visualized. Mild aortic valve stenosis. Aortic valve  area, by VTI measures 1.93 cm.  Aortic valve mean gradient measures 14.0 mmHg. Aortic valve Vmax measures  2.56 m/s.   5. Aortic dilatation noted. There is mild dilatation of the aortic root,  measuring 37 mm.   6. The inferior vena cava is normal in size with greater than 50%  respiratory variability, suggesting right atrial pressure of 3 mmHg.   Comparison(s): 02/09/13 EF 55-60%.   Echocardiogram 07/24/2022 IMPRESSIONS     1. Left ventricular ejection fraction, by estimation, is 60 to 65%. The  left ventricle has normal function. The left ventricle has no regional  wall motion abnormalities. Left ventricular diastolic parameters are  indeterminate.   2. Right ventricular systolic function is normal. The right ventricular  size is normal. There is normal pulmonary artery systolic pressure.   3. The mitral valve is normal in structure. Mild mitral valve  regurgitation. No evidence of mitral stenosis.   4. The aortic valve is calcified. There is moderate calcification of the  aortic valve. Aortic valve regurgitation is mild. Mild aortic valve  stenosis. Aortic valve area, by VTI measures 1.23 cm. Aortic valve mean  gradient measures 17.6 mmHg. Aortic  valve Vmax measures 2.80 m/s.   5. The inferior vena cava is  normal in size with greater than 50%  respiratory variability, suggesting right atrial pressure of 3 mmHg.   Comparison(s): Prior images reviewed side by side. 12/12/20 EF 60-65%. GLS  -22.2%. Mild AS 36mHg mean PG, 269mg peak PG.   FINDINGS   Left Ventricle: Left ventricular ejection fraction, by estimation, is 60  to 65%. The left ventricle has normal function. The left ventricle has no   regional wall motion abnormalities. The left ventricular internal cavity  size was normal in size. There is   no left ventricular hypertrophy. Left ventricular diastolic parameters  are indeterminate.   Right Ventricle: The right ventricular size is normal. No increase in  right ventricular wall thickness. Right ventricular systolic function is  normal. There is normal pulmonary artery systolic pressure. The tricuspid  regurgitant velocity is 2.28 m/s, and   with an assumed right atrial pressure of 3 mmHg, the estimated right  ventricular systolic pressure is 2376.1mHg.   Left Atrium: Left atrial size was normal in size.   Right Atrium: Right atrial size was normal in size.   Pericardium: There is no evidence of pericardial effusion.   Mitral Valve: The mitral valve is normal in structure. Mild mitral valve  regurgitation. No evidence of mitral valve stenosis.   Tricuspid Valve: The tricuspid valve is normal in structure. Tricuspid  valve regurgitation is not demonstrated. No evidence of tricuspid  stenosis.   Aortic Valve: The aortic valve is calcified. There is moderate  calcification of the aortic valve. Aortic valve regurgitation is mild.  Mild aortic stenosis is present. Aortic valve mean gradient measures 17.6  mmHg. Aortic valve peak gradient measures 31.3   mmHg. Aortic valve area, by VTI measures 1.23 cm.   Pulmonic Valve: The pulmonic valve was normal in structure. Pulmonic valve  regurgitation is trivial. No evidence of pulmonic stenosis.   Aorta: The aortic root is normal in size and structure.   Venous: The inferior vena cava is normal in size with greater than 50%  respiratory variability, suggesting right atrial pressure of 3 mmHg.   IAS/Shunts: No atrial level shunt detected by color flow Doppler.    Cardiac event monitor 01/02/2021  Frequent PACs (6.4%) Occasional PVCs (1.7%). 6 episodes of SVT, longest lasting 14 beats     Patch Wear Time:  13 days  and 23 hours (2022-03-31T11:36:41-0400 to 2022-04-14T11:19:58-0400)   Patient had a min HR of 44 bpm, max HR of 160 bpm, and avg HR of 75 bpm. Predominant underlying rhythm was Sinus Rhythm. 6 Supraventricular Tachycardia runs occurred, the run with the fastest interval lasting 10 beats with a max rate of 160 bpm, the  longest lasting 14 beats with an avg rate of 122 bpm. Isolated SVEs were frequent (6.4%, [id number]), SVE Couplets were rare (<1.0%, 432), and SVE Triplets were rare (<1.0%, 15). Isolated VEs were occasional (1.7%, [id number] VE Couplets were rare (<1.0%, 396),  and VE Triplets were rare (<1.0%, 17). Ventricular Bigeminy and Trigeminy were present. Difficulty discerning atrial activity making definitive diagnosis difficult to ascertain. Assessment & Plan   1. DOE, fatigue-stable.   Echocardiogram 07/24/2022 showed an LVEF of 60-65% intermediate diastolic parameters and mild mitral valvular regurgitation. Continue to increase physical activity as tolerated Patient reassured that his dyspnea and fatigue are not related to cardiac issues.   Atrial flutter-HR today 78.   Denies recent palpitations.  Previous cardiac event monitor showed 6 episodes of SVT with the longest being 14 beats.  Details above.  CHA2DS2-VASc score 3.  Patient previously not started on anticoagulation.  No identified recurrence of atrial flutter. Continue to monitor Avoid triggers caffeine, chocolate, EtOH, dehydration etc.-reviewed Increase physical activity as tolerated  Essential hypertension-BP today 132/70.  Well-controlled at home. Continue HCTZ Heart healthy low-sodium diet Increase physical activity as tolerated  Aortic valve stenosis-no increased DOE or activity intolerance.  Somewhat physically active.  Echocardiogram 3/22 showed mild aortic stenosis.  Mild aortic stenosis noted on 07/24/2022 echocardiogram. Continue to monitor.   Disposition: Follow-up with Dr. Gardiner Rhyme or me in 4-6 months.  Jossie Ng.  Marquel Pottenger NP-C     08/26/2022, 10:44 AM [address] Office [phone] Fax [phone]  Notice: This dictation was prepared with Dragon dictation along with smaller phrase technology. Any transcriptional errors that result from this process are unintentional and may not be corrected upon review.  I spent 14 minutes examining this patient, reviewing medications, and using patient centered shared decision making involving her cardiac care.  Prior to her visit I spent greater than 20 minutes reviewing her past medical history,  medications, and prior cardiac tests.

## 2022-08-26 ENCOUNTER — Encounter: Payer: Self-pay | Admitting: General Practice

## 2022-08-26 ENCOUNTER — Ambulatory Visit: Payer: Medicare Other | Attending: General Practice | Admitting: General Practice

## 2022-08-26 VITALS — BP 132/70 | HR 78 | Ht 69.0 in | Wt 160.8 lb

## 2022-08-26 DIAGNOSIS — R0609 Other forms of dyspnea: Secondary | ICD-10-CM | POA: Diagnosis not present

## 2022-08-26 DIAGNOSIS — I4892 Unspecified atrial flutter: Secondary | ICD-10-CM | POA: Diagnosis not present

## 2022-08-26 DIAGNOSIS — I350 Nonrheumatic aortic (valve) stenosis: Secondary | ICD-10-CM | POA: Diagnosis not present

## 2022-08-26 DIAGNOSIS — I10 Essential (primary) hypertension: Secondary | ICD-10-CM | POA: Diagnosis not present

## 2022-08-26 NOTE — Patient Instructions (Signed)
Medication Instructions:  The current medical regimen is effective;  continue present plan and medications as directed. Please refer to the Current Medication list given to you today.  *If you need a refill on your cardiac medications before your next appointment, please call your pharmacy*  Lab Work: NONE If you have labs (blood work) drawn today and your tests are completely normal, you will receive your results only by: [place] (if you have MyChart) OR A paper copy in the mail If you have any lab test that is abnormal or we need to change your treatment, we will call you to review the results.  Testing/Procedures: NONE  Follow-Up: At [facility], you and your health needs are our priority.  As part of our continuing mission to provide you with exceptional heart care, we have created designated Provider Care Teams.  These Care Teams include your primary Cardiologist (physician) and Advanced Practice Providers (APPs -  Physician Assistants and Nurse Practitioners) who all work together to provide you with the care you need, when you need it.  Your next appointment:   6 month(s)  The format for your next appointment:   In Person  Provider:   Donato Heinz, MD  or Coletta Memos, FNP       Other Instructions PLEASE READ AND FOLLOW ATTACHED  SALTY 6   MAINTAIN HYDRATION  PLEASE PURCHASE AND WEAR COMPRESSION STOCKINGS DAILY AND TAKE OFF AT BEDTIME. Compression stockings are elastic socks that squeeze the legs. They help to increase blood flow to the legs and to decrease swelling in the legs from fluid retention, and reduce the chance of developing blood clots in the lower legs. Please put on in the AM when dressing and off at night when dressing for bed.  LET THEM KNOW THAT YOU NEED KNEE HIGH'S WITH COMPRESSION OF 15-20 mmhg.  [employer]  [place] ([place] [id number]); [place], [place] [address]; [phone]  EMAIL    [email or web address].  PLEASE MAKE SURE TO ELEVATE YOUR FEET & LEGS WHILE SITTING, THIS WILL HELP WITH THE SWELLING ALSO.    Important Information About Sugar

## 2022-08-27 DIAGNOSIS — D485 Neoplasm of uncertain behavior of skin: Secondary | ICD-10-CM | POA: Diagnosis not present

## 2022-08-27 DIAGNOSIS — D1801 Hemangioma of skin and subcutaneous tissue: Secondary | ICD-10-CM | POA: Diagnosis not present

## 2022-08-27 DIAGNOSIS — L814 Other melanin hyperpigmentation: Secondary | ICD-10-CM | POA: Diagnosis not present

## 2022-08-27 DIAGNOSIS — L570 Actinic keratosis: Secondary | ICD-10-CM | POA: Diagnosis not present

## 2022-08-27 DIAGNOSIS — C44329 Squamous cell carcinoma of skin of other parts of face: Secondary | ICD-10-CM | POA: Diagnosis not present

## 2022-08-27 DIAGNOSIS — L821 Other seborrheic keratosis: Secondary | ICD-10-CM | POA: Diagnosis not present

## 2022-09-09 ENCOUNTER — Other Ambulatory Visit (HOSPITAL_BASED_OUTPATIENT_CLINIC_OR_DEPARTMENT_OTHER): Payer: Self-pay

## 2022-09-18 ENCOUNTER — Other Ambulatory Visit (HOSPITAL_BASED_OUTPATIENT_CLINIC_OR_DEPARTMENT_OTHER): Payer: Self-pay

## 2022-09-24 ENCOUNTER — Other Ambulatory Visit: Payer: Self-pay

## 2022-09-30 ENCOUNTER — Other Ambulatory Visit (HOSPITAL_BASED_OUTPATIENT_CLINIC_OR_DEPARTMENT_OTHER): Payer: Self-pay

## 2022-10-01 DIAGNOSIS — K080 Exfoliation of teeth due to systemic causes: Secondary | ICD-10-CM | POA: Diagnosis not present

## 2022-10-08 ENCOUNTER — Other Ambulatory Visit: Payer: Self-pay

## 2022-10-08 ENCOUNTER — Other Ambulatory Visit (HOSPITAL_BASED_OUTPATIENT_CLINIC_OR_DEPARTMENT_OTHER): Payer: Self-pay

## 2022-10-08 DIAGNOSIS — D0439 Carcinoma in situ of skin of other parts of face: Secondary | ICD-10-CM | POA: Diagnosis not present

## 2022-10-13 ENCOUNTER — Other Ambulatory Visit (HOSPITAL_BASED_OUTPATIENT_CLINIC_OR_DEPARTMENT_OTHER): Payer: Self-pay

## 2022-10-17 DIAGNOSIS — D485 Neoplasm of uncertain behavior of skin: Secondary | ICD-10-CM | POA: Diagnosis not present

## 2022-10-17 DIAGNOSIS — L905 Scar conditions and fibrosis of skin: Secondary | ICD-10-CM | POA: Diagnosis not present

## 2022-10-23 DIAGNOSIS — H353112 Nonexudative age-related macular degeneration, right eye, intermediate dry stage: Secondary | ICD-10-CM | POA: Diagnosis not present

## 2022-10-23 DIAGNOSIS — H43813 Vitreous degeneration, bilateral: Secondary | ICD-10-CM | POA: Diagnosis not present

## 2022-10-23 DIAGNOSIS — H353223 Exudative age-related macular degeneration, left eye, with inactive scar: Secondary | ICD-10-CM | POA: Diagnosis not present

## 2022-10-27 DIAGNOSIS — K080 Exfoliation of teeth due to systemic causes: Secondary | ICD-10-CM | POA: Diagnosis not present

## 2022-10-30 NOTE — Progress Notes (Signed)
Patient: Victor Sutton Date of Birth: Feb 06, 1928  Reason for Visit: Follow up History from: Patient, wife  Primary Neurologist: Dr. Krista Blue  ASSESSMENT AND PLAN 89 y.o. year old male   1.  Seropositive myasthenia gravis -Diagnosed in 2014, at his worst significant dysarthria, dysphagia, required feeding tube for a few months, responded well to prednisone, tapered off -Continue CellCept 500 mg daily, this is the best regimen, wishes to remain, we have tried to lower to 250 mg daily with worsening symptoms -Mestinon as needed 60 mg, 1/2 tablet 3 times a day PRN -Check labs today while on Cellcept  2.  Lumbar stenosis -Greatest at L2-L3, L3-L4, potential to affect L3 and L4 nerve roots -Currently no significant back pain, will continue to monitor,  if pain increases consider referral to pain management  HISTORY  Victor Sutton is a 89 year old male, follow-up for seropositive generalized myasthenia gravis,   I reviewed and summarized the referring note.  Past medical history Hypertension Prostate hypertrophy   Patient's and his family noted as early as 2010, he was noted to have mild gait abnormality, in 2014, he developed facial weakness, significant bulbar weakness, dysarthria, dysphagia, to the point of requiring feeding tube, eventually he was seen by Dr. Jannifer Franklin in July 2014, was diagnosed with seropositive generalized myasthenia gravis  He was treated with prednisone 30 mg daily, Mestinon, began to show significant improvement, later CellCept 500 mg twice daily was added on, since 2014, about 6 months into treatment, he showed significant improvement, then developed bilateral lower extremity weakness, was considered due to high dose of steroid treatment, began steroid tapering, eventually was able to taper off steroid at the very slow pace in May 2017 without recurrent weakness, CellCept later on was decreased to 500 mg daily, he was able to taper off Mestinon as needed,  Over  the years, his symptoms is well controlled, highly functioning, but he suffered COVID in May 2022, reported chest cold-like symptoms, only last for few days  But ever since then, he noticed fatigue, on further questioning, he described overwhelming waves of unsteadiness, tightness sensation over his body, he would feel his legs give out underneath him if he is in a standing position, often helped by sitting down,  At last visit September 2022, he was seen by Jinny Blossom, restarted Mestinon 60 mg half tablets twice daily, patient reported that it did help his symptoms  He also complains of urinary frequency, frequent nocturia, occasionally incontinence, low back pain radiating pain to left hip,  Update April 29, 2022 SS: Acetylcholine receptor binding was positive 4.72 , CK TSH were normal (10/24/21). He did 4 months PT, was helpful, more confidence, balance is better. Yesterday walked 3 miles with daily activities. Is currently taking 500 mg at 4 AM when he gets up to urinate. Tried to take 250 mg twice daily with food, upset his stomach. Want to go back to 500 mg daily. Denies any MG symptoms, occasional staggers. Taking Mestinon 30 mg 2 times daily. Denies any significant back pain. Planning to get back into walking at the [employer].  February 2023 MRI cervical spine showed mild degenerative changes, no evidence of spinal cord compression  MRI lumbar spine showed moderate spinal stenosis at L2-3 level, right greater than left foraminal stenosis  Update November 03, 2022 SS: Doing well, here with his wife. No issues to discuss, feels overall strong. Remains on Cellcept 500 mg daily. Has stopped Mestinon, he has if needed. Denies back pain. His  wife diagnosed with GCA, stroke. He is walking about 2 miles a day. He doesn't drive.  REVIEW OF SYSTEMS: Out of a complete 14 system review of symptoms, the patient complains only of the following symptoms, and all other reviewed systems are negative.  See  HPI  ALLERGIES: Allergies  Allergen Reactions   Ciprofloxacin Anaphylaxis    Avoid due to myasthenia gravis   Quinolones Anaphylaxis    Avoid due to myasthenia gravis    HOME MEDICATIONS: Outpatient Medications Prior to Visit  Medication Sig Dispense Refill   ascorbic acid (VITAMIN C) 250 MG tablet Take 250 mg by mouth 2 (two) times daily.     hydrochlorothiazide (HYDRODIURIL) 25 MG tablet Take 1 tablet (25 mg total) by mouth daily. 90 tablet 1   Multiple Vitamins-Minerals (ICAPS) CAPS Take 1 capsule by mouth 2 (two) times daily.     pantoprazole (PROTONIX) 40 MG tablet Take 40 mg by mouth daily. May take an additional 81ms as needed for heartburn     potassium chloride (KLOR-CON) 10 MEQ tablet TAKE 1 TABLET BY MOUTH ONCE DAILY 90 tablet 3   Probiotic Product (ALIGN) 4 MG CAPS take 1 po qd     pyridostigmine (MESTINON) 60 MG tablet Take 0.5 tablets (30 mg total) by mouth 2 (two) times daily as needed. 150 tablet 3   tamsulosin (FLOMAX) 0.4 MG CAPS capsule 0.4 mg as needed.   3   UNABLE TO FIND Take 1 tablet by mouth daily. Med Name:[employer] - zeaxanthin 12mand lutein 1042m   Vitamin D, Ergocalciferol, (DRISDOL) 50000 UNITS CAPS capsule Take 50,000 Units by mouth every 7 (seven) days. ON FRIDAYS     mycophenolate (CELLCEPT) 500 MG tablet Take 500 mg by mouth daily.     No facility-administered medications prior to visit.    PAST MEDICAL HISTORY: Past Medical History:  Diagnosis Date   Adenomatous colon polyp    B12 deficiency    HIstory   Basal cell carcinoma 08/18/2006   BASOSQUAMOUS LEFT OUTER FOREHEAD TX CX3 , EXC   BPH (benign prostatic hyperplasia)    [facility] Gal[facility]    [facility]  prec[facility] on [facility]    Cataract 2009   bilateral cataract surgery   Delayed gastric emptying    Disturbance of salivary secretion 06/20/2013   Dysphagia, pharyngoesophageal phase 03/16/2013   Dysphonia 03/16/2013   Gait abnormality 11/29/2019   GERD (gastroesophageal  reflux disease)    Hiatal hernia    hiatial hernia repair  02/08/13   History of blood transfusion    Hyperlipidemia    not on medication   Hypertension    Iron deficiency anemia    Leukoplakia 2001   Treated for   Macular degeneration    (L) wet, (R) dry   [facility]ia [facility]   Peripheral neuropathy    Mild History of   Polyneuritis 1965   Resolved   SCC (squamous cell carcinoma) 03/12/2011   SCC LEFT INNER CHEEK CX3 5FU   SCC (squamous cell carcinoma) 08/24/2012   RIGHT LOWER SHIN TX WITH BX MOHS   SCC (squamous cell carcinoma) 07/26/2014   LEFT UNDER CHEEK CX3 5FU   SCC (squamous cell carcinoma) 03/12/2021   well diff- right temple ([id number])   Squamous cell carcinoma of skin 05/03/2008   SCC INSITU RIGHT V OF CHEST TX EXC   Ulcer    Vitamin D deficiency     PAST SURGICAL HISTORY: Past Surgical History:  Procedure Laterality Date   Arthroscopic knee surgery     left   cateract surgeries      bilateral   CHOLECYSTECTOMY     COLONOSCOPY WITH PROPOFOL N/A 02/16/2017   Procedure: COLONOSCOPY WITH PROPOFOL;  Surgeon: Garlan Fair, MD;  Location: [facility];  Service: Endoscopy;  Laterality: N/A;   GASTROSTOMY W/ FEEDING TUBE  [id number]   HERNIA REPAIR     Laparoscopic ventral   HIATAL HERNIA REPAIR  2004   HIATAL HERNIA REPAIR N/A 02/08/2013   Procedure:  REPAIR OFCURRENT HIATAL HERNIA, ;  Surgeon: Odis Hollingshead, MD;  Location: [facility];  Service: General;  Laterality: N/A;   LAPAROSCOPIC LYSIS OF ADHESIONS N/A 02/08/2013   Procedure: LAPAROSCOPIC LYSIS OF ADHESIONS;  Surgeon: Odis Hollingshead, MD;  Location: [facility];  Service: General;  Laterality: N/A;   LAPAROSCOPIC NISSEN FUNDOPLICATION  [id number]   Procedure: LAPAROSCOPIC NISSEN FUNDOPLICATION;  Surgeon: Odis Hollingshead, MD;  Location: [facility];  Service: General;;   Multiple oral surgeries  2002   SKIN CANCER EXCISION  10/2012   right leg-shin area   STOMACH SURGERY  2005   states his stomach  had moved up toward his chest , some type of surgery performed   TONSILLECTOMY     UPPER GI ENDOSCOPY N/A 02/08/2013   Procedure: UPPER GI ENDOSCOPY;  Surgeon: Odis Hollingshead, MD;  Location: [facility];  Service: General;  Laterality: N/A;    FAMILY HISTORY: Family History  Problem Relation Age of Onset   Diabetes Father    Heart attack Father    Breast cancer Sister    CAD Sister    COPD Sister    Prostate cancer Paternal Grandfather    Colon cancer Son 63   Myasthenia gravis Son    Colon cancer Son 50    SOCIAL HISTORY: Social History   Socioeconomic History   Marital status: Married    Spouse name: [name]   Number of children: 3   Years of education: 16   Highest education level: Not on file  Occupational History   Occupation: Retired    Fish farm manager: RETIRED  Tobacco Use   Smoking status: Former    Types: Cigarettes    Quit date: 09/15/1976    Years since quitting: 46.1   Smokeless tobacco: Never   Tobacco comments:    Quit 1980  Vaping Use   Vaping Use: Never used  Substance and Sexual Activity   Alcohol use: No    Comment: Moderate alcohol, wine   Drug use: No   Sexual activity: Not on file  Other Topics Concern   Not on file  Social History Narrative   Lives at home, married   Patient is right-handed.   Patient drinks caffeine occasionally.      Social Determinants of Health   Financial Resource Strain: Not on file  Food Insecurity: Not on file  Transportation Needs: Not on file  Physical Activity: Not on file  Stress: Not on file  Social Connections: Not on file  Intimate Partner Violence: Not on file   PHYSICAL EXAM  Vitals:   11/03/22 1017  BP: 139/76  Pulse: 73  Weight: 160 lb (72.6 kg)  Height: 5' 9"$  (1.753 m)    Body mass index is 23.63 kg/m.  Generalized: Well developed, in no acute distress, elderly male Neurological examination  Mentation: Alert oriented to time, place, history taking. Follows all commands speech and language  fluent Cranial nerve II-XII: Pupils were equal  round reactive to light. Extraocular movements were full, visual field were full on confrontational test. Facial sensation and strength were normal.  Head turning and shoulder shrug  were normal and symmetric.  No eye open or cheek puff weakness noted. Motor: The motor testing reveals 5 over 5 strength of all 4 extremities. Good symmetric motor tone is noted throughout.  No neck weakness noted. Sensory: Sensory testing is intact to soft touch on all 4 extremities. No evidence of extinction is noted.  Coordination: Cerebellar testing reveals good finger-nose-finger and heel-to-shin bilaterally.  Gait and station: Has to push off from seated position to stand, gait is slightly wide-based, cautious, but independent Reflexes: Deep tendon reflexes are symmetric and normal bilaterally   DIAGNOSTIC DATA (LABS, IMAGING, TESTING) - I reviewed patient records, labs, notes, testing and imaging myself where available.  Lab Results  Component Value Date   WBC 5.3 06/05/2021   HGB 14.7 06/05/2021   HCT 43.1 06/05/2021   MCV 94 06/05/2021   PLT 237 06/05/2021      Component Value Date/Time   NA 134 06/05/2021 1115   K 4.6 06/05/2021 1115   CL 95 (L) 06/05/2021 1115   CO2 25 06/05/2021 1115   GLUCOSE 92 06/05/2021 1115   GLUCOSE 105 (H) 02/11/2014 1131   BUN 14 06/05/2021 1115   CREATININE 0.77 06/05/2021 1115   CALCIUM 9.3 06/05/2021 1115   PROT 6.5 06/05/2021 1115   ALBUMIN 3.9 06/05/2021 1115   AST 28 06/05/2021 1115   ALT 12 06/05/2021 1115   ALKPHOS 51 06/05/2021 1115   BILITOT 1.2 06/05/2021 1115   GFRNONAA 78 06/04/2020 0908   GFRAA 90 06/04/2020 0908   Lab Results  Component Value Date   CHOL 118 02/14/2013   TRIG 79 02/21/2013   No results found for: "HGBA1C" Lab Results  Component Value Date   VITAMINB12 1,057 12/03/2020   Lab Results  Component Value Date   TSH 1.900 10/24/2021   Butler Denmark, AGNP-C, DNP 11/03/2022, 10:48  AM [facility] [address], [place], [place] [address] [phone]

## 2022-11-03 ENCOUNTER — Encounter: Payer: Self-pay | Admitting: Neurology

## 2022-11-03 ENCOUNTER — Ambulatory Visit: Payer: Medicare Other | Admitting: Neurology

## 2022-11-03 VITALS — BP 139/76 | HR 73 | Ht 69.0 in | Wt 160.0 lb

## 2022-11-03 DIAGNOSIS — G7000 Myasthenia gravis without (acute) exacerbation: Secondary | ICD-10-CM

## 2022-11-03 DIAGNOSIS — R269 Unspecified abnormalities of gait and mobility: Secondary | ICD-10-CM | POA: Diagnosis not present

## 2022-11-03 MED ORDER — MYCOPHENOLATE MOFETIL 500 MG PO TABS: 500.0000 mg | ORAL_TABLET | Freq: Every day | ORAL | 3 refills | Status: DC

## 2022-11-03 NOTE — Patient Instructions (Signed)
Continue the Cellcept Check labs today

## 2022-11-04 LAB — CBC WITH DIFFERENTIAL/PLATELET
Basophils Absolute: 0 10*3/uL (ref 0.0–0.2)
Basos: 1 %
EOS (ABSOLUTE): 0.2 10*3/uL (ref 0.0–0.4)
Eos: 4 %
Hematocrit: 41.5 % (ref 37.5–51.0)
Hemoglobin: 14 g/dL (ref 13.0–17.7)
Immature Grans (Abs): 0 10*3/uL (ref 0.0–0.1)
Immature Granulocytes: 1 %
Lymphocytes Absolute: 1 10*3/uL (ref 0.7–3.1)
Lymphs: 23 %
MCH: 32.1 pg (ref 26.6–33.0)
MCHC: 33.7 g/dL (ref 31.5–35.7)
MCV: 95 fL (ref 79–97)
Monocytes Absolute: 0.5 10*3/uL (ref 0.1–0.9)
Monocytes: 11 %
Neutrophils Absolute: 2.7 10*3/uL (ref 1.4–7.0)
Neutrophils: 60 %
Platelets: 221 10*3/uL (ref 150–450)
RBC: 4.36 x10E6/uL (ref 4.14–5.80)
RDW: 11.8 % (ref 11.6–15.4)
WBC: 4.5 10*3/uL (ref 3.4–10.8)

## 2022-11-04 LAB — COMPREHENSIVE METABOLIC PANEL
ALT: 11 IU/L (ref 0–44)
AST: 25 IU/L (ref 0–40)
Albumin/Globulin Ratio: 1.6 (ref 1.2–2.2)
Albumin: 4 g/dL (ref 3.6–4.6)
Alkaline Phosphatase: 54 IU/L (ref 44–121)
BUN/Creatinine Ratio: 15 (ref 10–24)
BUN: 11 mg/dL (ref 10–36)
Bilirubin Total: 1 mg/dL (ref 0.0–1.2)
CO2: 26 mmol/L (ref 20–29)
Calcium: 9.2 mg/dL (ref 8.6–10.2)
Chloride: 99 mmol/L (ref 96–106)
Creatinine, Ser: 0.74 mg/dL — ABNORMAL LOW (ref 0.76–1.27)
Globulin, Total: 2.5 g/dL (ref 1.5–4.5)
Glucose: 96 mg/dL (ref 70–99)
Potassium: 4 mmol/L (ref 3.5–5.2)
Sodium: 137 mmol/L (ref 134–144)
Total Protein: 6.5 g/dL (ref 6.0–8.5)
eGFR: 85 mL/min/{1.73_m2} (ref >59)

## 2022-11-06 ENCOUNTER — Ambulatory Visit: Payer: Medicare Other | Admitting: Neurology

## 2022-11-13 DIAGNOSIS — E538 Deficiency of other specified B group vitamins: Secondary | ICD-10-CM | POA: Diagnosis not present

## 2022-11-13 DIAGNOSIS — M810 Age-related osteoporosis without current pathological fracture: Secondary | ICD-10-CM | POA: Diagnosis not present

## 2022-11-13 DIAGNOSIS — Z125 Encounter for screening for malignant neoplasm of prostate: Secondary | ICD-10-CM | POA: Diagnosis not present

## 2022-11-13 DIAGNOSIS — K219 Gastro-esophageal reflux disease without esophagitis: Secondary | ICD-10-CM | POA: Diagnosis not present

## 2022-11-13 DIAGNOSIS — I10 Essential (primary) hypertension: Secondary | ICD-10-CM | POA: Diagnosis not present

## 2022-11-20 DIAGNOSIS — Z1331 Encounter for screening for depression: Secondary | ICD-10-CM | POA: Diagnosis not present

## 2022-11-20 DIAGNOSIS — E441 Mild protein-calorie malnutrition: Secondary | ICD-10-CM | POA: Diagnosis not present

## 2022-11-20 DIAGNOSIS — R82998 Other abnormal findings in urine: Secondary | ICD-10-CM | POA: Diagnosis not present

## 2022-11-20 DIAGNOSIS — Z0000 Encounter for general adult medical examination without abnormal findings: Secondary | ICD-10-CM | POA: Diagnosis not present

## 2022-11-20 DIAGNOSIS — Z1339 Encounter for screening examination for other mental health and behavioral disorders: Secondary | ICD-10-CM | POA: Diagnosis not present

## 2022-11-20 DIAGNOSIS — D692 Other nonthrombocytopenic purpura: Secondary | ICD-10-CM | POA: Diagnosis not present

## 2022-11-20 DIAGNOSIS — I10 Essential (primary) hypertension: Secondary | ICD-10-CM | POA: Diagnosis not present

## 2022-11-20 DIAGNOSIS — G7000 Myasthenia gravis without (acute) exacerbation: Secondary | ICD-10-CM | POA: Diagnosis not present

## 2022-11-20 DIAGNOSIS — I4892 Unspecified atrial flutter: Secondary | ICD-10-CM | POA: Diagnosis not present

## 2023-01-26 ENCOUNTER — Telehealth: Payer: Self-pay | Admitting: Neurology

## 2023-01-26 NOTE — Telephone Encounter (Signed)
He accompanied his wife at her office visit  Reported he is taking CellCept 500 mg all at once around 3 AM, empty stomach, wife complains that he has a lot of stomach complaints, poor appetite, no recurrent myasthenia gravis symptoms, no dysarthria, no dysphagia,  Some nasal speech, soft voice, but no significant eye closure cheek puff, neck flexion or limb muscle weakness noted,  Advised him to change CellCept to 500 mg half tablet after meal twice a day,

## 2023-02-10 DIAGNOSIS — M810 Age-related osteoporosis without current pathological fracture: Secondary | ICD-10-CM | POA: Diagnosis not present

## 2023-02-10 DIAGNOSIS — D84821 Immunodeficiency due to drugs: Secondary | ICD-10-CM | POA: Diagnosis not present

## 2023-02-24 DIAGNOSIS — M810 Age-related osteoporosis without current pathological fracture: Secondary | ICD-10-CM | POA: Diagnosis not present

## 2023-02-27 DIAGNOSIS — H353112 Nonexudative age-related macular degeneration, right eye, intermediate dry stage: Secondary | ICD-10-CM | POA: Diagnosis not present

## 2023-02-27 DIAGNOSIS — H353223 Exudative age-related macular degeneration, left eye, with inactive scar: Secondary | ICD-10-CM | POA: Diagnosis not present

## 2023-02-27 DIAGNOSIS — H43813 Vitreous degeneration, bilateral: Secondary | ICD-10-CM | POA: Diagnosis not present

## 2023-03-13 ENCOUNTER — Ambulatory Visit: Payer: Medicare Other | Attending: Cardiology | Admitting: Cardiology

## 2023-03-13 ENCOUNTER — Encounter: Payer: Self-pay | Admitting: Cardiology

## 2023-03-13 VITALS — BP 130/64 | HR 82 | Ht 69.0 in | Wt 155.4 lb

## 2023-03-13 DIAGNOSIS — I10 Essential (primary) hypertension: Secondary | ICD-10-CM

## 2023-03-13 DIAGNOSIS — I4892 Unspecified atrial flutter: Secondary | ICD-10-CM

## 2023-03-13 DIAGNOSIS — I350 Nonrheumatic aortic (valve) stenosis: Secondary | ICD-10-CM | POA: Diagnosis not present

## 2023-03-13 NOTE — Progress Notes (Signed)
Cardiology Office Note:    Date:  03/13/2023   ID:  Victor Sutton, DOB Oct 04, 1927, MRN [id number]  PCP:  Cleatis Polka., MD  Cardiologist:  Little Ishikawa, MD  Electrophysiologist:  None   Referring MD: Cleatis Polka., MD   Chief Complaint  Patient presents with   Cardiac Valve Problem    History of Present Illness:    Victor Sutton is a 89 y.o. male with a hx of myasthenia gravis, atrial flutter following abdominal surgery in 2014, BPH, GERD, hyperlipidemia, hypertension who presents for follow-up.  He was referred by Dr. Clelia Croft for evaluation of murmur, initially seen on 11/26/2020.  He was seen by cardiology in 2014 after noted to have tachycardia post abdominal surgery.  Adenosine was administered and rhythm was determined to be atrial flutter.  He was not started on anticoagulation.  Echocardiogram 02/09/2013 showed normal biventricular function, moderate LVH, no significant valvular disease.  Labs on 10/23/2020 showed creatinine 0.8, sodium 133, potassium 3.7, albumin 3.7, WBC 4.7, hemoglobin 15.1, LDL 72, TSH 1.7.  He denies any chest pain but reports he has been having shortness of breath.  Occurs with minimal exertion such as cleaning his shower.  He denies any lightheadedness or syncope.  Reports he has had swelling of his legs for years.  Is on hydrochlorothiazide.  Reports he had an episode in February of palpitations where he felt his heart was racing.  Lasted for 3 to 4 minutes.  Smoked 0.5 pdd x 20 years, quit smoking in 1978.  Father had MI at 51.  Sister had CABG in 35s.    Echocardiogram on 12/12/2020 showed normal biventricular function, mild LVH, grade 1 diastolic dysfunction, mild aortic stenosis (mean gradient 14 mmHg, V-max 2.6 m/s, AVA 1.9 cm).  Zio patch x14 days on 01/02/2021 showed frequent PACs (6.4% of beats), occasional PVCs (1.7% of beats), 6 episodes of SVT, longest lasting 14 beats.  Echocardiogram 07/2022 showed EF 60 to 65%, normal RV  function, mild aortic stenosis, mild aortic regurgitation, mild mitral regurgitation.  Since last clinic visit, he reports he is doing well.  Denies any chest pain, dyspnea, lightheadedness, syncope, or palpitations.  Reports some lower extremity edema, has been wearing compression stockings.  Reports rare palpitations.  Past Medical History:  Diagnosis Date   Adenomatous colon polyp    B12 deficiency    HIstory   Basal cell carcinoma 08/18/2006   BASOSQUAMOUS LEFT OUTER FOREHEAD TX CX3 , EXC   BPH (benign prostatic hyperplasia)    Sheria Lang ulcer    Cancer [facility])    precancerous skin lesions on leg    Cataract 2009   bilateral cataract surgery   Delayed gastric emptying    Disturbance of salivary secretion 06/20/2013   Dysphagia, pharyngoesophageal phase 03/16/2013   Dysphonia 03/16/2013   Gait abnormality 11/29/2019   GERD (gastroesophageal reflux disease)    Hiatal hernia    hiatial hernia repair  02/08/13   History of blood transfusion    Hyperlipidemia    not on medication   Hypertension    Iron deficiency anemia    Leukoplakia 2001   Treated for   Macular degeneration    (L) wet, (R) dry   Myasthenia gravis (HCC) 03/21/2013   Peripheral neuropathy    Mild History of   Polyneuritis 1965   Resolved   SCC (squamous cell carcinoma) 03/12/2011   SCC LEFT INNER CHEEK CX3 5FU   SCC (squamous cell carcinoma) 08/24/2012  RIGHT LOWER SHIN TX WITH BX MOHS   SCC (squamous cell carcinoma) 07/26/2014   LEFT UNDER CHEEK CX3 5FU   SCC (squamous cell carcinoma) 03/12/2021   well diff- right temple (CX35FU)   Squamous cell carcinoma of skin 05/03/2008   SCC INSITU RIGHT V OF CHEST TX EXC   Ulcer    Vitamin D deficiency     Past Surgical History:  Procedure Laterality Date   Arthroscopic knee surgery     left   cateract surgeries      bilateral   CHOLECYSTECTOMY     COLONOSCOPY WITH PROPOFOL N/A 02/16/2017   Procedure: COLONOSCOPY WITH PROPOFOL;  Surgeon: Charolett Bumpers,  MD;  Location: [facility];  Service: Endoscopy;  Laterality: N/A;   GASTROSTOMY W/ FEEDING TUBE  [id number]   HERNIA REPAIR     Laparoscopic ventral   HIATAL HERNIA REPAIR  2004   HIATAL HERNIA REPAIR N/A 02/08/2013   Procedure:  REPAIR OFCURRENT HIATAL HERNIA, ;  Surgeon: Adolph Pollack, MD;  Location: [facility];  Service: General;  Laterality: N/A;   LAPAROSCOPIC LYSIS OF ADHESIONS N/A 02/08/2013   Procedure: LAPAROSCOPIC LYSIS OF ADHESIONS;  Surgeon: Adolph Pollack, MD;  Location: [facility];  Service: General;  Laterality: N/A;   LAPAROSCOPIC NISSEN FUNDOPLICATION  02/08/2013   Procedure: LAPAROSCOPIC NISSEN FUNDOPLICATION;  Surgeon: Adolph Pollack, MD;  Location: [facility];  Service: General;;   Multiple oral surgeries  2002   SKIN CANCER EXCISION  10/2012   right leg-shin area   STOMACH SURGERY  2005   states his stomach had moved up toward his chest , some type of surgery performed   TONSILLECTOMY     UPPER GI ENDOSCOPY N/A 02/08/2013   Procedure: UPPER GI ENDOSCOPY;  Surgeon: Adolph Pollack, MD;  Location: [facility];  Service: General;  Laterality: N/A;    Current Medications: Current Meds  Medication Sig   ascorbic acid (VITAMIN C) 250 MG tablet Take 250 mg by mouth 2 (two) times daily.   hydrochlorothiazide (HYDRODIURIL) 25 MG tablet Take 1 tablet (25 mg total) by mouth daily.   Multiple Vitamins-Minerals (ICAPS) CAPS Take 1 capsule by mouth 2 (two) times daily.   mycophenolate (CELLCEPT) 500 MG tablet Take 1 tablet (500 mg total) by mouth daily.   pantoprazole (PROTONIX) 40 MG tablet Take 40 mg by mouth daily. May take an additional 40mg s as needed for heartburn   potassium chloride (KLOR-CON) 10 MEQ tablet TAKE 1 TABLET BY MOUTH ONCE DAILY   Probiotic Product (ALIGN) 4 MG CAPS take 1 po qd   pyridostigmine (MESTINON) 60 MG tablet Take 0.5 tablets (30 mg total) by mouth 2 (two) times daily as needed.   tamsulosin (FLOMAX) 0.4 MG CAPS capsule 0.4 mg as needed.    UNABLE TO FIND  Take 1 tablet by mouth daily. Med Name:[employer] - zeaxanthin 10mg  and lutein 10mg    Vitamin D, Ergocalciferol, (DRISDOL) 50000 UNITS CAPS capsule Take 50,000 Units by mouth every 7 (seven) days. ON FRIDAYS   Zeaxanthin POWD Take by mouth.     Allergies:   Ciprofloxacin and Quinolones   Social History   Socioeconomic History   Marital status: Married    Spouse name: Shirlee Limerick   Number of children: 3   Years of education: 16   Highest education level: Not on file  Occupational History   Occupation: Retired    Associate Professor: RETIRED  Tobacco Use   Smoking status: Former    Types: Cigarettes  Quit date: 09/15/1976    Years since quitting: 46.5   Smokeless tobacco: Never   Tobacco comments:    Quit 1980  Vaping Use   Vaping Use: Never used  Substance and Sexual Activity   Alcohol use: No    Comment: Moderate alcohol, wine   Drug use: No   Sexual activity: Not on file  Other Topics Concern   Not on file  Social History Narrative   Lives at home, married   Patient is right-handed.   Patient drinks caffeine occasionally.      Social Determinants of Health   Financial Resource Strain: Not on file  Food Insecurity: Not on file  Transportation Needs: Not on file  Physical Activity: Not on file  Stress: Not on file  Social Connections: Not on file     Family History: The patient's family history includes Breast cancer in his sister; CAD in his sister; COPD in his sister; Colon cancer (age of onset: 12) in his son and son; Diabetes in his father; Heart attack in his father; Myasthenia gravis in his son; Prostate cancer in his paternal grandfather.  ROS:   Please see the history of present illness.     All other systems reviewed and are negative.  EKGs/Labs/Other Studies Reviewed:    The following studies were reviewed today:   EKG:  EKG is ordered today.  The ekg ordered today demonstrates normal sinus rhythm, rate 74, no ST/T abnormality  Recent Labs: 11/03/2022: ALT  11; BUN 11; Creatinine, Ser 0.74; Hemoglobin 14.0; Platelets 221; Potassium 4.0; Sodium 137  Recent Lipid Panel    Component Value Date/Time   CHOL 118 02/14/2013 0530   TRIG 79 02/21/2013 0520    Physical Exam:    VS:  BP 130/64   Pulse 82   Ht 5\' 9"  (1.753 m)   Wt 155 lb 6.4 oz (70.5 kg)   SpO2 98%   BMI 22.95 kg/m     Wt Readings from Last 3 Encounters:  03/13/23 155 lb 6.4 oz (70.5 kg)  11/03/22 160 lb (72.6 kg)  08/26/22 160 lb 12.8 oz (72.9 kg)     GEN:  in no acute distress HEENT: Normal NECK: No JVD; No carotid bruits LYMPHATICS: No lymphadenopathy CARDIAC: Normal rate, regular, 2 out of 6 systolic murmur loudest at RUSB RESPIRATORY:  Clear to auscultation without rales, wheezing or rhonchi  ABDOMEN: Soft, non-tender, non-distended MUSCULOSKELETAL:  No edema; No deformity  SKIN: Warm and dry NEUROLOGIC:  Alert and oriented x 3 PSYCHIATRIC:  Normal affect   ASSESSMENT:    1. Aortic valve stenosis, etiology of cardiac valve disease unspecified   2. Atrial flutter, unspecified type (HCC)   3. Essential hypertension      PLAN:    Aortic stenosis: Mild AS on echo 07/2022 (Appears mild to moderate with V-max 2.8 m/s, mean gradient 18 mmHg, AVA 1.2 cm^2).  Will monitor, plan repeat echocardiogram in 1 year  Atrial flutter: Occurred following hernia surgery in 2014, no evidence of recurrence.  CHA2DS2-VASc score 3 (hypertension, age x2) but was not been started on anticoagulation.  Has had no known recurrence of atrial flutter but reported palpitations.  Zio patch x14 days on 01/02/2021 showed frequent PACs (6.4% of beats), occasional PVCs (1.7% of beats), 6 episodes of SVT, longest lasting 14 beats.  Did not report palpitations while wearing the monitor.  Recommend considering Kardia mobile device for more long-term monitoring  Hypertension: On hydrochlorothiazide 25 mg daily.  Myasthenia gravis: On  CellCept  RTC in 6 months  Medication Adjustments/Labs and  Tests Ordered: Current medicines are reviewed at length with the patient today.  Concerns regarding medicines are outlined above.  Orders Placed This Encounter  Procedures   ECHOCARDIOGRAM COMPLETE    No orders of the defined types were placed in this encounter.    Patient Instructions  Medication Instructions:  Your physician recommends that you continue on your current medications as directed. Please refer to the Current Medication list given to you today. *If you need a refill on your cardiac medications before your next appointment, please call your pharmacy*   Lab Work: NONE ORDERED   Testing/Procedures: Your physician has requested that you have an echocardiogram. Echocardiography is a painless test that uses sound waves to create images of your heart. It provides your doctor with information about the size and shape of your heart and how well your heart's chambers and valves are working. This procedure takes approximately one hour. There are no restrictions for this procedure. Please do NOT wear cologne, perfume, aftershave, or lotions (deodorant is allowed). Please arrive 15 minutes prior to your appointment time. TEST WILL BE COMPLETED AT [address] SCHEDULE IN 6 MONTHS   Follow-Up: At [facility], you and your health needs are our priority.  As part of our continuing mission to provide you with exceptional heart care, we have created designated Provider Care Teams.  These Care Teams include your primary Cardiologist (physician) and Advanced Practice Providers (APPs -  Physician Assistants and Nurse Practitioners) who all work together to provide you with the care you need, when you need it.  We recommend signing up for the patient portal called "MyChart".  Sign up information is provided on this After Visit Summary.  MyChart is used to connect with patients for Virtual Visits (Telemedicine).  Patients are able to view lab/test results, encounter  notes, upcoming appointments, etc.  Non-urgent messages can be sent to your provider as well.   To learn more about what you can do with MyChart, go to [email or web address].    Your next appointment:   6 month(s) AFTER ECHO  Provider:   Little Ishikawa, MD     Other Instructions  CARDIA MOBILE DEVICE   Signed, Little Ishikawa, MD  03/13/2023 12:54 PM    [facility]

## 2023-03-13 NOTE — Patient Instructions (Signed)
Medication Instructions:  Your physician recommends that you continue on your current medications as directed. Please refer to the Current Medication list given to you today. *If you need a refill on your cardiac medications before your next appointment, please call your pharmacy*   Lab Work: NONE ORDERED   Testing/Procedures: Your physician has requested that you have an echocardiogram. Echocardiography is a painless test that uses sound waves to create images of your heart. It provides your doctor with information about the size and shape of your heart and how well your heart's chambers and valves are working. This procedure takes approximately one hour. There are no restrictions for this procedure. Please do NOT wear cologne, perfume, aftershave, or lotions (deodorant is allowed). Please arrive 15 minutes prior to your appointment time. TEST WILL BE COMPLETED AT [address] SCHEDULE IN 6 MONTHS   Follow-Up: At [facility], you and your health needs are our priority.  As part of our continuing mission to provide you with exceptional heart care, we have created designated Provider Care Teams.  These Care Teams include your primary Cardiologist (physician) and Advanced Practice Providers (APPs -  Physician Assistants and Nurse Practitioners) who all work together to provide you with the care you need, when you need it.  We recommend signing up for the patient portal called "MyChart".  Sign up information is provided on this After Visit Summary.  MyChart is used to connect with patients for Virtual Visits (Telemedicine).  Patients are able to view lab/test results, encounter notes, upcoming appointments, etc.  Non-urgent messages can be sent to your provider as well.   To learn more about what you can do with MyChart, go to [email or web address].    Your next appointment:   6 month(s) AFTER ECHO  Provider:   Little Ishikawa, MD     Other Instructions   CARDIA MOBILE DEVICE

## 2023-04-02 DIAGNOSIS — M810 Age-related osteoporosis without current pathological fracture: Secondary | ICD-10-CM | POA: Diagnosis not present

## 2023-04-07 ENCOUNTER — Other Ambulatory Visit (HOSPITAL_COMMUNITY): Payer: Self-pay | Admitting: *Deleted

## 2023-04-09 ENCOUNTER — Ambulatory Visit (HOSPITAL_COMMUNITY)
Admission: RE | Admit: 2023-04-09 | Discharge: 2023-04-09 | Disposition: A | Discharge status: Home / Self Care | Payer: Medicare Other | Source: Ambulatory Visit | Attending: Internal Medicine | Admitting: Internal Medicine

## 2023-04-09 DIAGNOSIS — M810 Age-related osteoporosis without current pathological fracture: Secondary | ICD-10-CM | POA: Insufficient documentation

## 2023-04-09 DIAGNOSIS — Z79620 Long term (current) use of immunosuppressive biologic: Secondary | ICD-10-CM | POA: Insufficient documentation

## 2023-04-09 MED ORDER — DENOSUMAB 60 MG/ML ~~LOC~~ SOSY
60.0000 mg | PREFILLED_SYRINGE | Freq: Once | SUBCUTANEOUS | Status: AC
  Administered 2023-04-09: 60 mg via SUBCUTANEOUS

## 2023-04-09 MED ORDER — DENOSUMAB 60 MG/ML ~~LOC~~ SOSY
PREFILLED_SYRINGE | SUBCUTANEOUS | Status: AC
  Filled 2023-04-09: qty 1

## 2023-04-14 DIAGNOSIS — K080 Exfoliation of teeth due to systemic causes: Secondary | ICD-10-CM | POA: Diagnosis not present

## 2023-04-16 ENCOUNTER — Encounter (HOSPITAL_COMMUNITY): Payer: Medicare Other

## 2023-05-04 DIAGNOSIS — K080 Exfoliation of teeth due to systemic causes: Secondary | ICD-10-CM | POA: Diagnosis not present

## 2023-05-25 NOTE — Progress Notes (Unsigned)
Patient: Victor Sutton Date of Birth: 12/20/1927  Reason for Visit: Follow up History from: Patient, wife  Primary Neurologist: Dr. Terrace Arabia  ASSESSMENT AND PLAN 89 y.o. year old male   1.  Seropositive myasthenia gravis -Diagnosed in 2014, at his worst significant dysarthria, dysphagia, required feeding tube for a few months, responded well to prednisone, tapered off -He wishes to try lower dose CellCept, currently taking 500 mg daily, we will try 250 mg daily, if any worsening MG symptoms will return to 500 mg daily -Mestinon as needed 60 mg, 1/2 tablet 3 times a day PRN -Check labs today while on Cellcept -Weight loss, not much appetite, continue to monitor, follow up with PCP  2.  Lumbar stenosis -Greatest at L2-L3, L3-L4, potential to affect L3 and L4 nerve roots -Currently no significant back pain, will continue to monitor,  if pain increases consider referral to pain management  Meds ordered this encounter  Medications   mycophenolate (CELLCEPT) 250 MG capsule    Sig: Take 1 capsule (250 mg total) by mouth daily.    Dispense:  30 capsule    Refill:  6   pyridostigmine (MESTINON) 60 MG tablet    Sig: Take 0.5 tablets (30 mg total) by mouth 2 (two) times daily as needed.    Dispense:  150 tablet    Refill:  3   HISTORY  Victor Sutton is a 89 year old male, follow-up for seropositive generalized myasthenia gravis,   I reviewed and summarized the referring note.  Past medical history Hypertension Prostate hypertrophy   Patient's and his family noted as early as 2010, he was noted to have mild gait abnormality, in 2014, he developed facial weakness, significant bulbar weakness, dysarthria, dysphagia, to the point of requiring feeding tube, eventually he was seen by Dr. Anne Hahn in July 2014, was diagnosed with seropositive generalized myasthenia gravis  He was treated with prednisone 30 mg daily, Mestinon, began to show significant improvement, later CellCept 500 mg twice  daily was added on, since 2014, about 6 months into treatment, he showed significant improvement, then developed bilateral lower extremity weakness, was considered due to high dose of steroid treatment, began steroid tapering, eventually was able to taper off steroid at the very slow pace in May 2017 without recurrent weakness, CellCept later on was decreased to 500 mg daily, he was able to taper off Mestinon as needed,  Over the years, his symptoms is well controlled, highly functioning, but he suffered COVID in May 2022, reported chest cold-like symptoms, only last for few days  But ever since then, he noticed fatigue, on further questioning, he described overwhelming waves of unsteadiness, tightness sensation over his body, he would feel his legs give out underneath him if he is in a standing position, often helped by sitting down,  At last visit September 2022, he was seen by Aundra Millet, restarted Mestinon 60 mg half tablets twice daily, patient reported that it did help his symptoms  He also complains of urinary frequency, frequent nocturia, occasionally incontinence, low back pain radiating pain to left hip,  Update April 29, 2022 SS: Acetylcholine receptor binding was positive 4.72 , CK TSH were normal (10/24/21). He did 4 months PT, was helpful, more confidence, balance is better. Yesterday walked 3 miles with daily activities. Is currently taking 500 mg at 4 AM when he gets up to urinate. Tried to take 250 mg twice daily with food, upset his stomach. Want to go back to 500 mg daily. Denies any  MG symptoms, occasional staggers. Taking Mestinon 30 mg 2 times daily. Denies any significant back pain. Planning to get back into walking at the [employer].  February 2023 MRI cervical spine showed mild degenerative changes, no evidence of spinal cord compression  MRI lumbar spine showed moderate spinal stenosis at L2-3 level, right greater than left foraminal stenosis  Update November 03, 2022 SS: Doing  well, here with his wife. No issues to discuss, feels overall strong. Remains on Cellcept 500 mg daily. Has stopped Mestinon, he has if needed. Denies back pain. His wife diagnosed with GCA, stroke. He is walking about 2 miles a day. He doesn't drive.  Update May 26, 2023 SS: In may 2024 Dr. Terrace Arabia advised reduce Cellcept from 500 mg daily to 250 mg BID to minimize reported stomach upset. Has been helping his wife recover from hip replacement. He tried the 250 mg BID, still had stomach upset, went back to Cellcept 500 mg daily. He mentions wanting to try lowering dose of Cellcept. He is not driving due to vision. Most MG symptoms are general fatigue, not necessarily muscle weakness. Not much appetite, no choking. Speech is normal for him. He uses Mestinon PRN, mostly for stomach constipation.   REVIEW OF SYSTEMS: Out of a complete 14 system review of symptoms, the patient complains only of the following symptoms, and all other reviewed systems are negative.  See HPI  ALLERGIES: Allergies  Allergen Reactions   Ciprofloxacin Anaphylaxis    Avoid due to myasthenia gravis   Quinolones Anaphylaxis    Avoid due to myasthenia gravis    HOME MEDICATIONS: Outpatient Medications Prior to Visit  Medication Sig Dispense Refill   ascorbic acid (VITAMIN C) 250 MG tablet Take 250 mg by mouth 2 (two) times daily.     hydrochlorothiazide (HYDRODIURIL) 25 MG tablet Take 1 tablet (25 mg total) by mouth daily. 90 tablet 1   Multiple Vitamins-Minerals (ICAPS) CAPS Take 1 capsule by mouth 2 (two) times daily.     pantoprazole (PROTONIX) 40 MG tablet Take 40 mg by mouth daily. May take an additional 40mg s as needed for heartburn     potassium chloride (KLOR-CON) 10 MEQ tablet TAKE 1 TABLET BY MOUTH ONCE DAILY 90 tablet 3   Probiotic Product (ALIGN) 4 MG CAPS take 1 po qd     tamsulosin (FLOMAX) 0.4 MG CAPS capsule 0.4 mg as needed.   3   UNABLE TO FIND Take 1 tablet by mouth daily. Med Name:[facility] -  zeaxanthin 10mg  and lutein 10mg      Vitamin D, Ergocalciferol, (DRISDOL) 50000 UNITS CAPS capsule Take 50,000 Units by mouth every 7 (seven) days. ON FRIDAYS     Zeaxanthin POWD Take by mouth.     mycophenolate (CELLCEPT) 500 MG tablet Take 1 tablet (500 mg total) by mouth daily. 90 tablet 3   pyridostigmine (MESTINON) 60 MG tablet Take 0.5 tablets (30 mg total) by mouth 2 (two) times daily as needed. 150 tablet 3   No facility-administered medications prior to visit.    PAST MEDICAL HISTORY: Past Medical History:  Diagnosis Date   Adenomatous colon polyp    B12 deficiency    HIstory   Basal cell carcinoma 08/18/2006   BASOSQUAMOUS LEFT OUTER FOREHEAD TX CX3 , EXC   BPH (benign prostatic hyperplasia)    Sheria Lang ulcer    Cancer [facility])    precancerous skin lesions on leg    Cataract 2009   bilateral cataract surgery   Delayed gastric emptying  Disturbance of salivary secretion 06/20/2013   Dysphagia, pharyngoesophageal phase 03/16/2013   Dysphonia 03/16/2013   Gait abnormality 11/29/2019   GERD (gastroesophageal reflux disease)    Hiatal hernia    hiatial hernia repair  02/08/13   History of blood transfusion    Hyperlipidemia    not on medication   Hypertension    Iron deficiency anemia    Leukoplakia 2001   Treated for   Macular degeneration    (L) wet, (R) dry   Myasthenia gravis (HCC) 03/21/2013   Peripheral neuropathy    Mild History of   Polyneuritis 1965   Resolved   SCC (squamous cell carcinoma) 03/12/2011   SCC LEFT INNER CHEEK CX3 5FU   SCC (squamous cell carcinoma) 08/24/2012   RIGHT LOWER SHIN TX WITH BX MOHS   SCC (squamous cell carcinoma) 07/26/2014   LEFT UNDER CHEEK CX3 5FU   SCC (squamous cell carcinoma) 03/12/2021   well diff- right temple (CX35FU)   Squamous cell carcinoma of skin 05/03/2008   SCC INSITU RIGHT V OF CHEST TX EXC   Ulcer    Vitamin D deficiency     PAST SURGICAL HISTORY: Past Surgical History:  Procedure Laterality Date    Arthroscopic knee surgery     left   cateract surgeries      bilateral   CHOLECYSTECTOMY     COLONOSCOPY WITH PROPOFOL N/A 02/16/2017   Procedure: COLONOSCOPY WITH PROPOFOL;  Surgeon: Charolett Bumpers, MD;  Location: [facility];  Service: Endoscopy;  Laterality: N/A;   GASTROSTOMY W/ FEEDING TUBE  [id number]   HERNIA REPAIR     Laparoscopic ventral   HIATAL HERNIA REPAIR  2004   HIATAL HERNIA REPAIR N/A 02/08/2013   Procedure:  REPAIR OFCURRENT HIATAL HERNIA, ;  Surgeon: Adolph Pollack, MD;  Location: [facility];  Service: General;  Laterality: N/A;   LAPAROSCOPIC LYSIS OF ADHESIONS N/A 02/08/2013   Procedure: LAPAROSCOPIC LYSIS OF ADHESIONS;  Surgeon: Adolph Pollack, MD;  Location: [facility];  Service: General;  Laterality: N/A;   LAPAROSCOPIC NISSEN FUNDOPLICATION  02/08/2013   Procedure: LAPAROSCOPIC NISSEN FUNDOPLICATION;  Surgeon: Adolph Pollack, MD;  Location: [facility];  Service: General;;   Multiple oral surgeries  2002   SKIN CANCER EXCISION  10/2012   right leg-shin area   STOMACH SURGERY  2005   states his stomach had moved up toward his chest , some type of surgery performed   TONSILLECTOMY     UPPER GI ENDOSCOPY N/A 02/08/2013   Procedure: UPPER GI ENDOSCOPY;  Surgeon: Adolph Pollack, MD;  Location: [facility];  Service: General;  Laterality: N/A;    FAMILY HISTORY: Family History  Problem Relation Age of Onset   Diabetes Father    Heart attack Father    Breast cancer Sister    CAD Sister    COPD Sister    Prostate cancer Paternal Grandfather    Colon cancer Son 73   Myasthenia gravis Son    Colon cancer Son 72    SOCIAL HISTORY: Social History   Socioeconomic History   Marital status: Married    Spouse name: Shirlee Limerick   Number of children: 3   Years of education: 16   Highest education level: Not on file  Occupational History   Occupation: Retired    Associate Professor: RETIRED  Tobacco Use   Smoking status: Former    Current packs/day: 0.00    Types: Cigarettes     Quit date: 09/15/1976  Years since quitting: 46.7   Smokeless tobacco: Never   Tobacco comments:    Quit 1980  Vaping Use   Vaping status: Never Used  Substance and Sexual Activity   Alcohol use: No    Comment: Moderate alcohol, wine   Drug use: No   Sexual activity: Not on file  Other Topics Concern   Not on file  Social History Narrative   Lives at home, married   Patient is right-handed.   Patient drinks caffeine occasionally.      Social Determinants of Health   Financial Resource Strain: Not on file  Food Insecurity: Not on file  Transportation Needs: Not on file  Physical Activity: Not on file  Stress: Not on file  Social Connections: Not on file  Intimate Partner Violence: Not on file   PHYSICAL EXAM  Vitals:   05/26/23 1053  BP: 123/79  Pulse: 72  Weight: 147 lb (66.7 kg)  Height: 5' 9.5" (1.765 m)   Body mass index is 21.4 kg/m.  Generalized: Well developed, in no acute distress, elderly male, very nice, well dressed  Neurological examination  Mentation: Alert oriented to time, place, history taking. Follows all commands speech and language fluent. Speech is slightly wet. Cranial nerve II-XII: Pupils were equal round reactive to light. Extraocular movements were full, visual field were full on confrontational test. Facial sensation and strength were normal.  Head turning and shoulder shrug  were normal and symmetric.  No eye open or cheek puff weakness noted. No ptosis.  Motor: The motor testing reveals 5 over 5 strength of all 4 extremities. Good symmetric motor tone is noted throughout.  No neck weakness noted. Sensory: Sensory testing is intact to soft touch on all 4 extremities. No evidence of extinction is noted.  Coordination: Cerebellar testing reveals good finger-nose-finger and heel-to-shin bilaterally.  Gait and station: Can push off with arms crossed with 1 rock,  gait is slightly wide-based, cautious, but independent Reflexes: Deep tendon  reflexes are symmetric and normal bilaterally   DIAGNOSTIC DATA (LABS, IMAGING, TESTING) - I reviewed patient records, labs, notes, testing and imaging myself where available.  Lab Results  Component Value Date   WBC 4.5 11/03/2022   HGB 14.0 11/03/2022   HCT 41.5 11/03/2022   MCV 95 11/03/2022   PLT 221 11/03/2022      Component Value Date/Time   NA 137 11/03/2022 1052   K 4.0 11/03/2022 1052   CL 99 11/03/2022 1052   CO2 26 11/03/2022 1052   GLUCOSE 96 11/03/2022 1052   GLUCOSE 105 (H) 02/11/2014 1131   BUN 11 11/03/2022 1052   CREATININE 0.74 (L) 11/03/2022 1052   CALCIUM 9.2 11/03/2022 1052   PROT 6.5 11/03/2022 1052   ALBUMIN 4.0 11/03/2022 1052   AST 25 11/03/2022 1052   ALT 11 11/03/2022 1052   ALKPHOS 54 11/03/2022 1052   BILITOT 1.0 11/03/2022 1052   GFRNONAA 78 06/04/2020 0908   GFRAA 90 06/04/2020 0908   Lab Results  Component Value Date   CHOL 118 02/14/2013   TRIG 79 02/21/2013   No results found for: "HGBA1C" Lab Results  Component Value Date   VITAMINB12 1,057 12/03/2020   Lab Results  Component Value Date   TSH 1.900 10/24/2021   Margie Ege, AGNP-C, DNP 05/26/2023, 11:37 AM [facility] [address] [place], [place] [address] [phone]

## 2023-05-26 ENCOUNTER — Ambulatory Visit: Payer: Medicare Other | Admitting: Neurology

## 2023-05-26 ENCOUNTER — Encounter: Payer: Self-pay | Admitting: Neurology

## 2023-05-26 VITALS — BP 123/79 | HR 72 | Ht 69.5 in | Wt 147.0 lb

## 2023-05-26 DIAGNOSIS — G7000 Myasthenia gravis without (acute) exacerbation: Secondary | ICD-10-CM

## 2023-05-26 MED ORDER — MYCOPHENOLATE MOFETIL 250 MG PO CAPS: 250.0000 mg | ORAL_CAPSULE | Freq: Every day | ORAL | 6 refills | Status: DC

## 2023-05-26 MED ORDER — PYRIDOSTIGMINE BROMIDE 60 MG PO TABS: 30.0000 mg | ORAL_TABLET | Freq: Two times a day (BID) | ORAL | 3 refills | Status: DC | PRN

## 2023-05-26 NOTE — Patient Instructions (Signed)
You may try lower dose Cellcept 250 mg daily, if any worsening symptoms, go back to 500 mg, continue Mestinon as needed. Check labs today.

## 2023-05-27 LAB — COMPREHENSIVE METABOLIC PANEL
ALT: 15 IU/L (ref 0–44)
AST: 29 IU/L (ref 0–40)
Albumin: 3.9 g/dL (ref 3.6–4.6)
Alkaline Phosphatase: 46 IU/L (ref 44–121)
BUN/Creatinine Ratio: 14 (ref 10–24)
BUN: 12 mg/dL (ref 10–36)
Bilirubin Total: 1.1 mg/dL (ref 0.0–1.2)
CO2: 26 mmol/L (ref 20–29)
Calcium: 9.5 mg/dL (ref 8.6–10.2)
Chloride: 100 mmol/L (ref 96–106)
Creatinine, Ser: 0.83 mg/dL (ref 0.76–1.27)
Globulin, Total: 2.7 g/dL (ref 1.5–4.5)
Glucose: 102 mg/dL — ABNORMAL HIGH (ref 70–99)
Potassium: 4 mmol/L (ref 3.5–5.2)
Sodium: 140 mmol/L (ref 134–144)
Total Protein: 6.6 g/dL (ref 6.0–8.5)
eGFR: 82 mL/min/{1.73_m2} (ref >59)

## 2023-05-27 LAB — CBC WITH DIFFERENTIAL/PLATELET
Basophils Absolute: 0 10*3/uL (ref 0.0–0.2)
Basos: 0 %
EOS (ABSOLUTE): 0.1 10*3/uL (ref 0.0–0.4)
Eos: 2 %
Hematocrit: 42.4 % (ref 37.5–51.0)
Hemoglobin: 14 g/dL (ref 13.0–17.7)
Immature Grans (Abs): 0 10*3/uL (ref 0.0–0.1)
Immature Granulocytes: 0 %
Lymphocytes Absolute: 1.3 10*3/uL (ref 0.7–3.1)
Lymphs: 23 %
MCH: 32.2 pg (ref 26.6–33.0)
MCHC: 33 g/dL (ref 31.5–35.7)
MCV: 98 fL — ABNORMAL HIGH (ref 79–97)
Monocytes Absolute: 0.6 10*3/uL (ref 0.1–0.9)
Monocytes: 10 %
Neutrophils Absolute: 3.5 10*3/uL (ref 1.4–7.0)
Neutrophils: 65 %
Platelets: 237 10*3/uL (ref 150–450)
RBC: 4.35 x10E6/uL (ref 4.14–5.80)
RDW: 11.8 % (ref 11.6–15.4)
WBC: 5.5 10*3/uL (ref 3.4–10.8)

## 2023-06-25 ENCOUNTER — Encounter: Payer: Self-pay | Admitting: Neurology

## 2023-06-25 ENCOUNTER — Telehealth: Payer: Self-pay | Admitting: Neurology

## 2023-06-25 MED ORDER — MYCOPHENOLATE MOFETIL 250 MG PO CAPS: 250.0000 mg | ORAL_CAPSULE | Freq: Every day | ORAL | 1 refills | Status: DC

## 2023-06-25 NOTE — Telephone Encounter (Signed)
Received scheduling message patient is doing well on lower dose CellCept 250 mg daily, would like 28-month supply.  Meds ordered this encounter  Medications   mycophenolate (CELLCEPT) 250 MG capsule    Sig: Take 1 capsule (250 mg total) by mouth daily.    Dispense:  90 capsule    Refill:  1

## 2023-07-07 ENCOUNTER — Other Ambulatory Visit: Payer: Self-pay

## 2023-07-07 MED ORDER — COMIRNATY 30 MCG/0.3ML IM SUSY
0.3000 mL | PREFILLED_SYRINGE | Freq: Once | INTRAMUSCULAR | 0 refills | Status: AC
  Filled 2023-07-07: qty 0.3, 1d supply, fill #0

## 2023-08-19 ENCOUNTER — Ambulatory Visit (HOSPITAL_COMMUNITY): Payer: Medicare Other | Attending: Cardiology

## 2023-08-19 DIAGNOSIS — I350 Nonrheumatic aortic (valve) stenosis: Secondary | ICD-10-CM | POA: Diagnosis not present

## 2023-08-19 LAB — ECHOCARDIOGRAM COMPLETE
AR max vel: 0.83 cm2
AV Area VTI: 0.76 cm2
AV Area mean vel: 0.79 cm2
AV Mean grad: 21 mm[Hg]
AV Peak grad: 33.4 mm[Hg]
Ao pk vel: 2.89 m/s
Area-P 1/2: 3.36 cm2
P 1/2 time: 420 ms
S' Lateral: 2.6 cm

## 2023-08-21 ENCOUNTER — Other Ambulatory Visit (HOSPITAL_COMMUNITY): Payer: Self-pay | Admitting: *Deleted

## 2023-08-21 DIAGNOSIS — I350 Nonrheumatic aortic (valve) stenosis: Secondary | ICD-10-CM

## 2023-08-24 DIAGNOSIS — H02102 Unspecified ectropion of right lower eyelid: Secondary | ICD-10-CM | POA: Diagnosis not present

## 2023-08-24 DIAGNOSIS — H26491 Other secondary cataract, right eye: Secondary | ICD-10-CM | POA: Diagnosis not present

## 2023-08-24 DIAGNOSIS — H02054 Trichiasis without entropian left upper eyelid: Secondary | ICD-10-CM | POA: Diagnosis not present

## 2023-08-24 DIAGNOSIS — H353232 Exudative age-related macular degeneration, bilateral, with inactive choroidal neovascularization: Secondary | ICD-10-CM | POA: Diagnosis not present

## 2023-08-25 DIAGNOSIS — N401 Enlarged prostate with lower urinary tract symptoms: Secondary | ICD-10-CM | POA: Diagnosis not present

## 2023-08-25 DIAGNOSIS — R351 Nocturia: Secondary | ICD-10-CM | POA: Diagnosis not present

## 2023-09-02 NOTE — Progress Notes (Unsigned)
Cardiology Office Note:    Date:  09/03/2023   ID:  Victor Sutton, DOB 1928-05-31, MRN [id number]  PCP:  Cleatis Polka., MD  Cardiologist:  Little Ishikawa, MD  Electrophysiologist:  None   Referring MD: Cleatis Polka., MD   Chief Complaint  Patient presents with   Cardiac Valve Problem    History of Present Illness:    Victor Sutton is a 89 y.o. male with a hx of myasthenia gravis, atrial flutter following abdominal surgery in 2014, BPH, GERD, hyperlipidemia, hypertension who presents for follow-up.  He was referred by Dr. Clelia Croft for evaluation of murmur, initially seen on 11/26/2020.  He was seen by cardiology in 2014 after noted to have tachycardia post abdominal surgery.  Adenosine was administered and rhythm was determined to be atrial flutter.  He was not started on anticoagulation.  Echocardiogram 02/09/2013 showed normal biventricular function, moderate LVH, no significant valvular disease.  Labs on 10/23/2020 showed creatinine 0.8, sodium 133, potassium 3.7, albumin 3.7, WBC 4.7, hemoglobin 15.1, LDL 72, TSH 1.7.  He denies any chest pain but reports he has been having shortness of breath.  Occurs with minimal exertion such as cleaning his shower.  He denies any lightheadedness or syncope.  Reports he has had swelling of his legs for years.  Is on hydrochlorothiazide.  Reports he had an episode in February of palpitations where he felt his heart was racing.  Lasted for 3 to 4 minutes.  Smoked 0.5 pdd x 20 years, quit smoking in 1978.  Father had MI at 7.  Sister had CABG in 64s.    Echocardiogram on 12/12/2020 showed normal biventricular function, mild LVH, grade 1 diastolic dysfunction, mild aortic stenosis (mean gradient 14 mmHg, V-max 2.6 m/s, AVA 1.9 cm).  Zio patch x14 days on 01/02/2021 showed frequent PACs (6.4% of beats), occasional PVCs (1.7% of beats), 6 episodes of SVT, longest lasting 14 beats.  Echocardiogram 07/2022 showed EF 60 to 65%, normal RV  function, mild aortic stenosis, mild aortic regurgitation, mild mitral regurgitation.  Echocardiogram 08/19/2023 showed EF 60 to 65%, normal RV function, moderate aortic stenosis.  Since last clinic visit, he reports he is doing okay.  Reports occasional shortness of breath.  He has not been exercising.  Had oral surgery this week.  Denies any chest pain, lightheadedness, syncope,  or palpitations.  Does report some LE edema  Past Medical History:  Diagnosis Date   Adenomatous colon polyp    B12 deficiency    HIstory   Basal cell carcinoma 08/18/2006   BASOSQUAMOUS LEFT OUTER FOREHEAD TX CX3 , EXC   BPH (benign prostatic hyperplasia)    Sheria Lang ulcer    Cancer [facility])    precancerous skin lesions on leg    Cataract 2009   bilateral cataract surgery   Delayed gastric emptying    Disturbance of salivary secretion 06/20/2013   Dysphagia, pharyngoesophageal phase 03/16/2013   Dysphonia 03/16/2013   Gait abnormality 11/29/2019   GERD (gastroesophageal reflux disease)    Hiatal hernia    hiatial hernia repair  02/08/13   History of blood transfusion    Hyperlipidemia    not on medication   Hypertension    Iron deficiency anemia    Leukoplakia 2001   Treated for   Macular degeneration    (L) wet, (R) dry   Myasthenia gravis (HCC) 03/21/2013   Peripheral neuropathy    Mild History of   Polyneuritis 1965   Resolved  SCC (squamous cell carcinoma) 03/12/2011   SCC LEFT INNER CHEEK CX3 5FU   SCC (squamous cell carcinoma) 08/24/2012   RIGHT LOWER SHIN TX WITH BX MOHS   SCC (squamous cell carcinoma) 07/26/2014   LEFT UNDER CHEEK CX3 5FU   SCC (squamous cell carcinoma) 03/12/2021   well diff- right temple (CX35FU)   Squamous cell carcinoma of skin 05/03/2008   SCC INSITU RIGHT V OF CHEST TX EXC   Ulcer    Vitamin D deficiency     Past Surgical History:  Procedure Laterality Date   Arthroscopic knee surgery     left   cateract surgeries      bilateral   CHOLECYSTECTOMY      COLONOSCOPY WITH PROPOFOL N/A 02/16/2017   Procedure: COLONOSCOPY WITH PROPOFOL;  Surgeon: Charolett Bumpers, MD;  Location: [facility];  Service: Endoscopy;  Laterality: N/A;   GASTROSTOMY W/ FEEDING TUBE  [id number]   HERNIA REPAIR     Laparoscopic ventral   HIATAL HERNIA REPAIR  2004   HIATAL HERNIA REPAIR N/A 02/08/2013   Procedure:  REPAIR OFCURRENT HIATAL HERNIA, ;  Surgeon: Adolph Pollack, MD;  Location: [facility];  Service: General;  Laterality: N/A;   LAPAROSCOPIC LYSIS OF ADHESIONS N/A 02/08/2013   Procedure: LAPAROSCOPIC LYSIS OF ADHESIONS;  Surgeon: Adolph Pollack, MD;  Location: [facility];  Service: General;  Laterality: N/A;   LAPAROSCOPIC NISSEN FUNDOPLICATION  02/08/2013   Procedure: LAPAROSCOPIC NISSEN FUNDOPLICATION;  Surgeon: Adolph Pollack, MD;  Location: [facility];  Service: General;;   Multiple oral surgeries  2002   SKIN CANCER EXCISION  10/2012   right leg-shin area   STOMACH SURGERY  2005   states his stomach had moved up toward his chest , some type of surgery performed   TONSILLECTOMY     UPPER GI ENDOSCOPY N/A 02/08/2013   Procedure: UPPER GI ENDOSCOPY;  Surgeon: Adolph Pollack, MD;  Location: [facility];  Service: General;  Laterality: N/A;    Current Medications: Current Meds  Medication Sig   ascorbic acid (VITAMIN C) 250 MG tablet Take 250 mg by mouth 2 (two) times daily.   chlorhexidine (PERIDEX) 0.12 % solution SWISH 1 CAPFUL IN MOUTH AND HOLD FOR 2 MINUTES, THEN SPIT OUT. USE TWICE A DAY.   hydrochlorothiazide (HYDRODIURIL) 25 MG tablet Take 1 tablet (25 mg total) by mouth daily.   Multiple Vitamins-Minerals (ICAPS) CAPS Take 1 capsule by mouth 2 (two) times daily.   mycophenolate (CELLCEPT) 250 MG capsule Take 1 capsule (250 mg total) by mouth daily.   pantoprazole (PROTONIX) 40 MG tablet Take 40 mg by mouth daily. May take an additional 40mg s as needed for heartburn   potassium chloride (KLOR-CON) 10 MEQ tablet TAKE 1 TABLET BY MOUTH ONCE DAILY    Probiotic Product (ALIGN) 4 MG CAPS take 1 po qd   pyridostigmine (MESTINON) 60 MG tablet Take 0.5 tablets (30 mg total) by mouth 2 (two) times daily as needed.   tamsulosin (FLOMAX) 0.4 MG CAPS capsule 0.4 mg as needed.    UNABLE TO FIND Take 1 tablet by mouth daily. Med Name:EyePromise - zeaxanthin 10mg  and lutein 10mg    Vitamin D, Ergocalciferol, (DRISDOL) 50000 UNITS CAPS capsule Take 50,000 Units by mouth every 7 (seven) days. ON FRIDAYS   Zeaxanthin POWD Take by mouth.     Allergies:   Ciprofloxacin and Quinolones   Social History   Socioeconomic History   Marital status: Married    Spouse name: Shirlee Limerick  Number of children: 3   Years of education: 16   Highest education level: Not on file  Occupational History   Occupation: Retired    Associate Professor: RETIRED  Tobacco Use   Smoking status: Former    Current packs/day: 0.00    Types: Cigarettes    Quit date: 09/15/1976    Years since quitting: 46.9   Smokeless tobacco: Never   Tobacco comments:    Quit 1980  Vaping Use   Vaping status: Never Used  Substance and Sexual Activity   Alcohol use: No    Comment: Moderate alcohol, wine   Drug use: No   Sexual activity: Not on file  Other Topics Concern   Not on file  Social History Narrative   Lives at home, married   Patient is right-handed.   Patient drinks caffeine occasionally.      Social Drivers of Corporate investment banker Strain: Not on file  Food Insecurity: Not on file  Transportation Needs: Not on file  Physical Activity: Not on file  Stress: Not on file  Social Connections: Not on file     Family History: The patient's family history includes Breast cancer in his sister; CAD in his sister; COPD in his sister; Colon cancer (age of onset: 31) in his son and son; Diabetes in his father; Heart attack in his father; Myasthenia gravis in his son; Prostate cancer in his paternal grandfather.  ROS:   Please see the history of present illness.     All other  systems reviewed and are negative.  EKGs/Labs/Other Studies Reviewed:    The following studies were reviewed today:   EKG:   09/03/2023: Normal sinus rhythm, PACs, rate 74 hide of Aleve with Tylenol clinic visit really when it is like that he was so late this year yeah  Recent Labs: 05/26/2023: ALT 15; BUN 12; Creatinine, Ser 0.83; Hemoglobin 14.0; Platelets 237; Potassium 4.0; Sodium 140  Recent Lipid Panel    Component Value Date/Time   CHOL 118 02/14/2013 0530   TRIG 79 02/21/2013 0520    Physical Exam:    VS:  BP 102/60   Pulse 74   Ht 5\' 9"  (1.753 m)   Wt 151 lb (68.5 kg)   SpO2 98%   BMI 22.30 kg/m     Wt Readings from Last 3 Encounters:  09/03/23 151 lb (68.5 kg)  05/26/23 147 lb (66.7 kg)  03/13/23 155 lb 6.4 oz (70.5 kg)     GEN:  in no acute distress HEENT: Normal NECK: No JVD CARDIAC: Normal rate, regular, 2 out of 6 systolic murmur loudest at RUSB RESPIRATORY:  Clear to auscultation without rales, wheezing or rhonchi  ABDOMEN: Soft, non-tender, non-distended MUSCULOSKELETAL:  No edema; No deformity  SKIN: Warm and dry NEUROLOGIC:  Alert and oriented x 3 PSYCHIATRIC:  Normal affect   ASSESSMENT:    1. Aortic valve stenosis, etiology of cardiac valve disease unspecified   2. Essential hypertension   3. Atrial flutter, unspecified type (HCC)       PLAN:    Aortic stenosis: Mild AS on echo 07/2022 (Appears mild to moderate with V-max 2.8 m/s, mean gradient 18 mmHg, AVA 1.2 cm^2).  Echocardiogram 08/2023 showed moderate aortic stenosis -Will monitor, plan repeat echocardiogram in 1 year  Atrial flutter: Occurred following hernia surgery in 2014, no evidence of recurrence.  CHA2DS2-VASc score 3 (hypertension, age x2) but was not been started on anticoagulation.  Has had no known recurrence of atrial flutter  but reported palpitations.  Zio patch x14 days on 01/02/2021 showed frequent PACs (6.4% of beats), occasional PVCs (1.7% of beats), 6 episodes of  SVT, longest lasting 14 beats.  Did not report palpitations while wearing the monitor.  Recommend considering Kardia mobile device for more long-term monitoring  Hypertension: On hydrochlorothiazide 25 mg daily.  Check BMET, magnesium  Myasthenia gravis: On CellCept  RTC in 6 months  Medication Adjustments/Labs and Tests Ordered: Current medicines are reviewed at length with the patient today.  Concerns regarding medicines are outlined above.  Orders Placed This Encounter  Procedures   Basic metabolic panel   Magnesium   EKG 12-Lead    No orders of the defined types were placed in this encounter.    Patient Instructions  Medication Instructions:  Continue all medications *If you need a refill on your cardiac medications before your next appointment, please call your pharmacy*   Lab Work: [email or web address]    Testing/Procedures: None ordered   Follow-Up: At [facility], you and your health needs are our priority.  As part of our continuing mission to provide you with exceptional heart care, we have created designated Provider Care Teams.  These Care Teams include your primary Cardiologist (physician) and Advanced Practice Providers (APPs -  Physician Assistants and Nurse Practitioners) who all work together to provide you with the care you need, when you need it.  We recommend signing up for the patient portal called "MyChart".  Sign up information is provided on this After Visit Summary.  MyChart is used to connect with patients for Virtual Visits (Telemedicine).  Patients are able to view lab/test results, encounter notes, upcoming appointments, etc.  Non-urgent messages can be sent to your provider as well.   To learn more about what you can do with MyChart, go to [email or web address].    Your next appointment:  6 months    Call in March to schedule June appointment     Provider:  Dr.Tiombe Tomeo        Signed, Little Ishikawa, MD  09/03/2023 10:11 AM     [facility]

## 2023-09-03 ENCOUNTER — Encounter: Payer: Self-pay | Admitting: Cardiology

## 2023-09-03 ENCOUNTER — Ambulatory Visit: Payer: Medicare Other | Attending: Cardiology | Admitting: Cardiology

## 2023-09-03 VITALS — BP 102/60 | HR 74 | Ht 69.0 in | Wt 151.0 lb

## 2023-09-03 DIAGNOSIS — I10 Essential (primary) hypertension: Secondary | ICD-10-CM

## 2023-09-03 DIAGNOSIS — I4892 Unspecified atrial flutter: Secondary | ICD-10-CM | POA: Diagnosis not present

## 2023-09-03 DIAGNOSIS — I350 Nonrheumatic aortic (valve) stenosis: Secondary | ICD-10-CM | POA: Diagnosis not present

## 2023-09-03 NOTE — Patient Instructions (Signed)
Medication Instructions:  Continue all medications *If you need a refill on your cardiac medications before your next appointment, please call your pharmacy*   Lab Work: [email or web address]    Testing/Procedures: None ordered   Follow-Up: At [facility], you and your health needs are our priority.  As part of our continuing mission to provide you with exceptional heart care, we have created designated Provider Care Teams.  These Care Teams include your primary Cardiologist (physician) and Advanced Practice Providers (APPs -  Physician Assistants and Nurse Practitioners) who all work together to provide you with the care you need, when you need it.  We recommend signing up for the patient portal called "MyChart".  Sign up information is provided on this After Visit Summary.  MyChart is used to connect with patients for Virtual Visits (Telemedicine).  Patients are able to view lab/test results, encounter notes, upcoming appointments, etc.  Non-urgent messages can be sent to your provider as well.   To learn more about what you can do with MyChart, go to [email or web address].    Your next appointment:  6 months    Call in March to schedule June appointment     Provider:  Dr.Schumann

## 2023-09-04 LAB — BASIC METABOLIC PANEL
BUN/Creatinine Ratio: 21 (ref 10–24)
BUN: 20 mg/dL (ref 10–36)
CO2: 24 mmol/L (ref 20–29)
Calcium: 9.4 mg/dL (ref 8.6–10.2)
Chloride: 101 mmol/L (ref 96–106)
Creatinine, Ser: 0.95 mg/dL (ref 0.76–1.27)
Glucose: 76 mg/dL (ref 70–99)
Potassium: 4.2 mmol/L (ref 3.5–5.2)
Sodium: 142 mmol/L (ref 134–144)
eGFR: 75 mL/min/{1.73_m2} (ref >59)

## 2023-09-04 LAB — MAGNESIUM: Magnesium: 2.2 mg/dL (ref 1.6–2.3)

## 2023-10-27 DIAGNOSIS — K080 Exfoliation of teeth due to systemic causes: Secondary | ICD-10-CM | POA: Diagnosis not present

## 2023-11-12 DIAGNOSIS — L820 Inflamed seborrheic keratosis: Secondary | ICD-10-CM | POA: Diagnosis not present

## 2023-11-12 DIAGNOSIS — L814 Other melanin hyperpigmentation: Secondary | ICD-10-CM | POA: Diagnosis not present

## 2023-11-12 DIAGNOSIS — Z789 Other specified health status: Secondary | ICD-10-CM | POA: Diagnosis not present

## 2023-11-12 DIAGNOSIS — D225 Melanocytic nevi of trunk: Secondary | ICD-10-CM | POA: Diagnosis not present

## 2023-11-12 DIAGNOSIS — L538 Other specified erythematous conditions: Secondary | ICD-10-CM | POA: Diagnosis not present

## 2023-11-12 DIAGNOSIS — L570 Actinic keratosis: Secondary | ICD-10-CM | POA: Diagnosis not present

## 2023-11-12 DIAGNOSIS — Z08 Encounter for follow-up examination after completed treatment for malignant neoplasm: Secondary | ICD-10-CM | POA: Diagnosis not present

## 2023-11-12 DIAGNOSIS — R208 Other disturbances of skin sensation: Secondary | ICD-10-CM | POA: Diagnosis not present

## 2023-11-12 DIAGNOSIS — L821 Other seborrheic keratosis: Secondary | ICD-10-CM | POA: Diagnosis not present

## 2023-11-19 DIAGNOSIS — K080 Exfoliation of teeth due to systemic causes: Secondary | ICD-10-CM | POA: Diagnosis not present

## 2023-12-02 DIAGNOSIS — I10 Essential (primary) hypertension: Secondary | ICD-10-CM | POA: Diagnosis not present

## 2023-12-02 DIAGNOSIS — N400 Enlarged prostate without lower urinary tract symptoms: Secondary | ICD-10-CM | POA: Diagnosis not present

## 2023-12-02 DIAGNOSIS — E538 Deficiency of other specified B group vitamins: Secondary | ICD-10-CM | POA: Diagnosis not present

## 2023-12-02 DIAGNOSIS — M810 Age-related osteoporosis without current pathological fracture: Secondary | ICD-10-CM | POA: Diagnosis not present

## 2023-12-07 DIAGNOSIS — Z1331 Encounter for screening for depression: Secondary | ICD-10-CM | POA: Diagnosis not present

## 2023-12-07 DIAGNOSIS — R82998 Other abnormal findings in urine: Secondary | ICD-10-CM | POA: Diagnosis not present

## 2023-12-07 DIAGNOSIS — Z0000 Encounter for general adult medical examination without abnormal findings: Secondary | ICD-10-CM | POA: Diagnosis not present

## 2023-12-07 DIAGNOSIS — G7000 Myasthenia gravis without (acute) exacerbation: Secondary | ICD-10-CM | POA: Diagnosis not present

## 2023-12-07 DIAGNOSIS — Z1339 Encounter for screening examination for other mental health and behavioral disorders: Secondary | ICD-10-CM | POA: Diagnosis not present

## 2023-12-07 DIAGNOSIS — I10 Essential (primary) hypertension: Secondary | ICD-10-CM | POA: Diagnosis not present

## 2023-12-10 ENCOUNTER — Ambulatory Visit: Payer: Medicare Other | Admitting: Neurology

## 2023-12-16 NOTE — Progress Notes (Unsigned)
 Patient: Victor Sutton Date of Birth: 1928/05/03  Reason for Visit: Follow up History from: Patient, wife  Primary Neurologist: Dr. Terrace Arabia  ASSESSMENT AND PLAN 89 y.o. year old male   1.  Seropositive myasthenia gravis -Diagnosed in 2014, at his worst significant dysarthria, dysphagia, required feeding tube for a few months, responded well to prednisone, tapered off -He wishes to try lower dose CellCept, currently taking 500 mg daily, we will try 250 mg daily, if any worsening MG symptoms will return to 500 mg daily -Mestinon as needed 60 mg, 1/2 tablet 3 times a day PRN -Check labs today while on Cellcept -Weight loss, not much appetite, continue to monitor, follow up with PCP  2.  Lumbar stenosis -Greatest at L2-L3, L3-L4, potential to affect L3 and L4 nerve roots -Currently no significant back pain, will continue to monitor,  if pain increases consider referral to pain management  No orders of the defined types were placed in this encounter.  HISTORY  Victor Sutton is a 89 year old male, follow-up for seropositive generalized myasthenia gravis,   I reviewed and summarized the referring note.  Past medical history Hypertension Prostate hypertrophy   Patient's and his family noted as early as 2010, he was noted to have mild gait abnormality, in 2014, he developed facial weakness, significant bulbar weakness, dysarthria, dysphagia, to the point of requiring feeding tube, eventually he was seen by Dr. Anne Hahn in July 2014, was diagnosed with seropositive generalized myasthenia gravis  He was treated with prednisone 30 mg daily, Mestinon, began to show significant improvement, later CellCept 500 mg twice daily was added on, since 2014, about 6 months into treatment, he showed significant improvement, then developed bilateral lower extremity weakness, was considered due to high dose of steroid treatment, began steroid tapering, eventually was able to taper off steroid at the very  slow pace in May 2017 without recurrent weakness, CellCept later on was decreased to 500 mg daily, he was able to taper off Mestinon as needed,  Over the years, his symptoms is well controlled, highly functioning, but he suffered COVID in May 2022, reported chest cold-like symptoms, only last for few days  But ever since then, he noticed fatigue, on further questioning, he described overwhelming waves of unsteadiness, tightness sensation over his body, he would feel his legs give out underneath him if he is in a standing position, often helped by sitting down,  At last visit September 2022, he was seen by Aundra Millet, restarted Mestinon 60 mg half tablets twice daily, patient reported that it did help his symptoms  He also complains of urinary frequency, frequent nocturia, occasionally incontinence, low back pain radiating pain to left hip,  Update April 29, 2022 SS: Acetylcholine receptor binding was positive 4.72 , CK TSH were normal (10/24/21). He did 4 months PT, was helpful, more confidence, balance is better. Yesterday walked 3 miles with daily activities. Is currently taking 500 mg at 4 AM when he gets up to urinate. Tried to take 250 mg twice daily with food, upset his stomach. Want to go back to 500 mg daily. Denies any MG symptoms, occasional staggers. Taking Mestinon 30 mg 2 times daily. Denies any significant back pain. Planning to get back into walking at the [employer].  February 2023 MRI cervical spine showed mild degenerative changes, no evidence of spinal cord compression  MRI lumbar spine showed moderate spinal stenosis at L2-3 level, right greater than left foraminal stenosis  Update November 03, 2022 SS: Doing well,  here with his wife. No issues to discuss, feels overall strong. Remains on Cellcept 500 mg daily. Has stopped Mestinon, he has if needed. Denies back pain. His wife diagnosed with GCA, stroke. He is walking about 2 miles a day. He doesn't drive.  Update May 26, 2023  SS: In may 2024 Dr. Terrace Arabia advised reduce Cellcept from 500 mg daily to 250 mg BID to minimize reported stomach upset. Has been helping his wife recover from hip replacement. He tried the 250 mg BID, still had stomach upset, went back to Cellcept 500 mg daily. He mentions wanting to try lowering dose of Cellcept. He is not driving due to vision. Most MG symptoms are general fatigue, not necessarily muscle weakness. Not much appetite, no choking. Speech is normal for him. He uses Mestinon PRN, mostly for stomach constipation.   Update December 17, 2023 SS:   REVIEW OF SYSTEMS: Out of a complete 14 system review of symptoms, the patient complains only of the following symptoms, and all other reviewed systems are negative.  See HPI  ALLERGIES: Allergies  Allergen Reactions   Ciprofloxacin Anaphylaxis    Avoid due to myasthenia gravis   Quinolones Anaphylaxis    Avoid due to myasthenia gravis    HOME MEDICATIONS: Outpatient Medications Prior to Visit  Medication Sig Dispense Refill   ascorbic acid (VITAMIN C) 250 MG tablet Take 250 mg by mouth 2 (two) times daily.     chlorhexidine (PERIDEX) 0.12 % solution SWISH 1 CAPFUL IN MOUTH AND HOLD FOR 2 MINUTES, THEN SPIT OUT. USE TWICE A DAY.     hydrochlorothiazide (HYDRODIURIL) 25 MG tablet Take 1 tablet (25 mg total) by mouth daily. 90 tablet 1   Multiple Vitamins-Minerals (ICAPS) CAPS Take 1 capsule by mouth 2 (two) times daily.     mycophenolate (CELLCEPT) 250 MG capsule Take 1 capsule (250 mg total) by mouth daily. 90 capsule 1   pantoprazole (PROTONIX) 40 MG tablet Take 40 mg by mouth daily. May take an additional 40mg s as needed for heartburn     potassium chloride (KLOR-CON) 10 MEQ tablet TAKE 1 TABLET BY MOUTH ONCE DAILY 90 tablet 3   Probiotic Product (ALIGN) 4 MG CAPS take 1 po qd     pyridostigmine (MESTINON) 60 MG tablet Take 0.5 tablets (30 mg total) by mouth 2 (two) times daily as needed. 150 tablet 3   tamsulosin (FLOMAX) 0.4 MG CAPS  capsule 0.4 mg as needed.   3   UNABLE TO FIND Take 1 tablet by mouth daily. Med Name:[facility] - zeaxanthin 10mg  and lutein 10mg      Vitamin D, Ergocalciferol, (DRISDOL) 50000 UNITS CAPS capsule Take 50,000 Units by mouth every 7 (seven) days. ON FRIDAYS     Zeaxanthin POWD Take by mouth.     No facility-administered medications prior to visit.    PAST MEDICAL HISTORY: Past Medical History:  Diagnosis Date   Adenomatous colon polyp    B12 deficiency    HIstory   Basal cell carcinoma 08/18/2006   BASOSQUAMOUS LEFT OUTER FOREHEAD TX CX3 , EXC   BPH (benign prostatic hyperplasia)    Sheria Lang ulcer    Cancer [facility])    precancerous skin lesions on leg    Cataract 2009   bilateral cataract surgery   Delayed gastric emptying    Disturbance of salivary secretion 06/20/2013   Dysphagia, pharyngoesophageal phase 03/16/2013   Dysphonia 03/16/2013   Gait abnormality 11/29/2019   GERD (gastroesophageal reflux disease)    Hiatal hernia  hiatial hernia repair  02/08/13   History of blood transfusion    Hyperlipidemia    not on medication   Hypertension    Iron deficiency anemia    Leukoplakia 2001   Treated for   Macular degeneration    (L) wet, (R) dry   Myasthenia gravis (HCC) 03/21/2013   Peripheral neuropathy    Mild History of   Polyneuritis 1965   Resolved   SCC (squamous cell carcinoma) 03/12/2011   SCC LEFT INNER CHEEK CX3 5FU   SCC (squamous cell carcinoma) 08/24/2012   RIGHT LOWER SHIN TX WITH BX MOHS   SCC (squamous cell carcinoma) 07/26/2014   LEFT UNDER CHEEK CX3 5FU   SCC (squamous cell carcinoma) 03/12/2021   well diff- right temple (CX35FU)   Squamous cell carcinoma of skin 05/03/2008   SCC INSITU RIGHT V OF CHEST TX EXC   Ulcer    Vitamin D deficiency     PAST SURGICAL HISTORY: Past Surgical History:  Procedure Laterality Date   Arthroscopic knee surgery     left   cateract surgeries      bilateral   CHOLECYSTECTOMY     COLONOSCOPY WITH PROPOFOL  N/A 02/16/2017   Procedure: COLONOSCOPY WITH PROPOFOL;  Surgeon: Charolett Bumpers, MD;  Location: [facility];  Service: Endoscopy;  Laterality: N/A;   GASTROSTOMY W/ FEEDING TUBE  [id number]   HERNIA REPAIR     Laparoscopic ventral   HIATAL HERNIA REPAIR  2004   HIATAL HERNIA REPAIR N/A 02/08/2013   Procedure:  REPAIR OFCURRENT HIATAL HERNIA, ;  Surgeon: Adolph Pollack, MD;  Location: [facility];  Service: General;  Laterality: N/A;   LAPAROSCOPIC LYSIS OF ADHESIONS N/A 02/08/2013   Procedure: LAPAROSCOPIC LYSIS OF ADHESIONS;  Surgeon: Adolph Pollack, MD;  Location: [facility];  Service: General;  Laterality: N/A;   LAPAROSCOPIC NISSEN FUNDOPLICATION  02/08/2013   Procedure: LAPAROSCOPIC NISSEN FUNDOPLICATION;  Surgeon: Adolph Pollack, MD;  Location: [facility];  Service: General;;   Multiple oral surgeries  2002   SKIN CANCER EXCISION  10/2012   right leg-shin area   STOMACH SURGERY  2005   states his stomach had moved up toward his chest , some type of surgery performed   TONSILLECTOMY     UPPER GI ENDOSCOPY N/A 02/08/2013   Procedure: UPPER GI ENDOSCOPY;  Surgeon: Adolph Pollack, MD;  Location: [facility];  Service: General;  Laterality: N/A;    FAMILY HISTORY: Family History  Problem Relation Age of Onset   Diabetes Father    Heart attack Father    Breast cancer Sister    CAD Sister    COPD Sister    Prostate cancer Paternal Grandfather    Colon cancer Son 60   Myasthenia gravis Son    Colon cancer Son 50    SOCIAL HISTORY: Social History   Socioeconomic History   Marital status: Married    Spouse name: Shirlee Limerick   Number of children: 3   Years of education: 16   Highest education level: Not on file  Occupational History   Occupation: Retired    Associate Professor: RETIRED  Tobacco Use   Smoking status: Former    Current packs/day: 0.00    Types: Cigarettes    Quit date: 09/15/1976    Years since quitting: 47.2   Smokeless tobacco: Never   Tobacco comments:    Quit 1980  Vaping  Use   Vaping status: Never Used  Substance and Sexual Activity  Alcohol use: No    Comment: Moderate alcohol, wine   Drug use: No   Sexual activity: Not on file  Other Topics Concern   Not on file  Social History Narrative   Lives at home, married   Patient is right-handed.   Patient drinks caffeine occasionally.      Social Drivers of Corporate investment banker Strain: Not on file  Food Insecurity: Not on file  Transportation Needs: Not on file  Physical Activity: Not on file  Stress: Not on file  Social Connections: Not on file  Intimate Partner Violence: Not on file   PHYSICAL EXAM  There were no vitals filed for this visit.  There is no height or weight on file to calculate BMI.  Generalized: Well developed, in no acute distress, elderly male, very nice, well dressed  Neurological examination  Mentation: Alert oriented to time, place, history taking. Follows all commands speech and language fluent. Speech is slightly wet. Cranial nerve II-XII: Pupils were equal round reactive to light. Extraocular movements were full, visual field were full on confrontational test. Facial sensation and strength were normal.  Head turning and shoulder shrug  were normal and symmetric.  No eye open or cheek puff weakness noted. No ptosis.  Motor: The motor testing reveals 5 over 5 strength of all 4 extremities. Good symmetric motor tone is noted throughout.  No neck weakness noted. Sensory: Sensory testing is intact to soft touch on all 4 extremities. No evidence of extinction is noted.  Coordination: Cerebellar testing reveals good finger-nose-finger and heel-to-shin bilaterally.  Gait and station: Can push off with arms crossed with 1 rock,  gait is slightly wide-based, cautious, but independent Reflexes: Deep tendon reflexes are symmetric and normal bilaterally   DIAGNOSTIC DATA (LABS, IMAGING, TESTING) - I reviewed patient records, labs, notes, testing and imaging myself where  available.  Lab Results  Component Value Date   WBC 5.5 05/26/2023   HGB 14.0 05/26/2023   HCT 42.4 05/26/2023   MCV 98 (H) 05/26/2023   PLT 237 05/26/2023      Component Value Date/Time   NA 142 09/03/2023 1031   K 4.2 09/03/2023 1031   CL 101 09/03/2023 1031   CO2 24 09/03/2023 1031   GLUCOSE 76 09/03/2023 1031   GLUCOSE 105 (H) 02/11/2014 1131   BUN 20 09/03/2023 1031   CREATININE 0.95 09/03/2023 1031   CALCIUM 9.4 09/03/2023 1031   PROT 6.6 05/26/2023 1139   ALBUMIN 3.9 05/26/2023 1139   AST 29 05/26/2023 1139   ALT 15 05/26/2023 1139   ALKPHOS 46 05/26/2023 1139   BILITOT 1.1 05/26/2023 1139   GFRNONAA 78 06/04/2020 0908   GFRAA 90 06/04/2020 0908   Lab Results  Component Value Date   CHOL 118 02/14/2013   TRIG 79 02/21/2013   No results found for: "HGBA1C" Lab Results  Component Value Date   VITAMINB12 1,057 12/03/2020   Lab Results  Component Value Date   TSH 1.900 10/24/2021   Margie Ege, AGNP-C, DNP 12/16/2023, 4:26 PM [facility] [address] [place], [place] [address] [phone]

## 2023-12-17 ENCOUNTER — Encounter: Payer: Self-pay | Admitting: Neurology

## 2023-12-17 ENCOUNTER — Ambulatory Visit: Payer: Medicare Other | Admitting: Neurology

## 2023-12-17 VITALS — BP 133/74 | HR 70 | Ht 69.0 in | Wt 153.0 lb

## 2023-12-17 DIAGNOSIS — G7000 Myasthenia gravis without (acute) exacerbation: Secondary | ICD-10-CM | POA: Diagnosis not present

## 2023-12-17 MED ORDER — MYCOPHENOLATE MOFETIL 250 MG PO CAPS: 250.0000 mg | ORAL_CAPSULE | Freq: Every day | ORAL | 1 refills | Status: DC

## 2023-12-17 NOTE — Patient Instructions (Signed)
 Continue Cellcept 250 mg daily Check labs today  Get back to exercise Monitor for any weakness  Follow up in 6 months with Dr.Yan

## 2023-12-18 LAB — COMPREHENSIVE METABOLIC PANEL WITH GFR
ALT: 12 IU/L (ref 0–44)
AST: 23 IU/L (ref 0–40)
Albumin: 3.8 g/dL (ref 3.6–4.6)
Alkaline Phosphatase: 56 IU/L (ref 44–121)
BUN/Creatinine Ratio: 15 (ref 10–24)
BUN: 14 mg/dL (ref 10–36)
Bilirubin Total: 1 mg/dL (ref 0.0–1.2)
CO2: 24 mmol/L (ref 20–29)
Calcium: 9.1 mg/dL (ref 8.6–10.2)
Chloride: 99 mmol/L (ref 96–106)
Creatinine, Ser: 0.91 mg/dL (ref 0.76–1.27)
Globulin, Total: 2.6 g/dL (ref 1.5–4.5)
Glucose: 98 mg/dL (ref 70–99)
Potassium: 4 mmol/L (ref 3.5–5.2)
Sodium: 139 mmol/L (ref 134–144)
Total Protein: 6.4 g/dL (ref 6.0–8.5)
eGFR: 79 mL/min/{1.73_m2} (ref >59)

## 2023-12-18 LAB — CBC WITH DIFFERENTIAL/PLATELET
Basophils Absolute: 0 10*3/uL (ref 0.0–0.2)
Basos: 1 %
EOS (ABSOLUTE): 0.4 10*3/uL (ref 0.0–0.4)
Eos: 6 %
Hematocrit: 43.5 % (ref 37.5–51.0)
Hemoglobin: 14.2 g/dL (ref 13.0–17.7)
Immature Grans (Abs): 0 10*3/uL (ref 0.0–0.1)
Immature Granulocytes: 0 %
Lymphocytes Absolute: 1.7 10*3/uL (ref 0.7–3.1)
Lymphs: 27 %
MCH: 31.4 pg (ref 26.6–33.0)
MCHC: 32.6 g/dL (ref 31.5–35.7)
MCV: 96 fL (ref 79–97)
Monocytes Absolute: 0.5 10*3/uL (ref 0.1–0.9)
Monocytes: 8 %
Neutrophils Absolute: 3.6 10*3/uL (ref 1.4–7.0)
Neutrophils: 58 %
Platelets: 221 10*3/uL (ref 150–450)
RBC: 4.52 x10E6/uL (ref 4.14–5.80)
RDW: 12.1 % (ref 11.6–15.4)
WBC: 6.2 10*3/uL (ref 3.4–10.8)

## 2023-12-21 ENCOUNTER — Encounter: Payer: Self-pay | Admitting: Neurology

## 2023-12-24 ENCOUNTER — Other Ambulatory Visit (HOSPITAL_COMMUNITY): Payer: Self-pay | Admitting: *Deleted

## 2023-12-25 ENCOUNTER — Ambulatory Visit (HOSPITAL_COMMUNITY)
Admission: RE | Admit: 2023-12-25 | Discharge: 2023-12-25 | Disposition: A | Discharge status: Home / Self Care | Source: Ambulatory Visit | Attending: Internal Medicine | Admitting: Internal Medicine

## 2023-12-25 DIAGNOSIS — M810 Age-related osteoporosis without current pathological fracture: Secondary | ICD-10-CM | POA: Insufficient documentation

## 2023-12-25 DIAGNOSIS — Z79620 Long term (current) use of immunosuppressive biologic: Secondary | ICD-10-CM | POA: Diagnosis not present

## 2023-12-25 MED ORDER — DENOSUMAB 60 MG/ML ~~LOC~~ SOSY: 60.0000 mg | PREFILLED_SYRINGE | Freq: Once | SUBCUTANEOUS | Status: AC

## 2023-12-25 MED ORDER — DENOSUMAB 60 MG/ML ~~LOC~~ SOSY
PREFILLED_SYRINGE | SUBCUTANEOUS | Status: AC
  Administered 2023-12-25: 60 mg via SUBCUTANEOUS
  Filled 2023-12-25: qty 1

## 2024-02-21 NOTE — Progress Notes (Unsigned)
 Cardiology Clinic Note   Patient Name: Victor Sutton Date of Encounter: 02/21/2024  Primary Care Provider:  Jeannine Milroy., MD Primary Cardiologist:  Wendie Hamburg, MD  Patient Profile    Victor Sutton 89 year old male presents to the clinic today for follow-up evaluation of his atrial fibrillation.  Past Medical History    Past Medical History:  Diagnosis Date   Adenomatous colon polyp    B12 deficiency    HIstory   Basal cell carcinoma 08/18/2006   BASOSQUAMOUS LEFT OUTER FOREHEAD TX CX3 , EXC   BPH (benign prostatic hyperplasia)    Donelda Fujita ulcer    Cancer [facility])    precancerous skin lesions on leg    Cataract 2009   bilateral cataract surgery   Delayed gastric emptying    Disturbance of salivary secretion 06/20/2013   Dysphagia, pharyngoesophageal phase 03/16/2013   Dysphonia 03/16/2013   Gait abnormality 11/29/2019   GERD (gastroesophageal reflux disease)    Hiatal hernia    hiatial hernia repair  02/08/13   History of blood transfusion    Hyperlipidemia    not on medication   Hypertension    Iron  deficiency anemia    Leukoplakia 2001   Treated for   Macular degeneration    (L) wet, (R) dry   Myasthenia gravis (HCC) 03/21/2013   Peripheral neuropathy    Mild History of   Polyneuritis 1965   Resolved   SCC (squamous cell carcinoma) 03/12/2011   SCC LEFT INNER CHEEK CX3 5FU   SCC (squamous cell carcinoma) 08/24/2012   RIGHT LOWER SHIN TX WITH BX MOHS   SCC (squamous cell carcinoma) 07/26/2014   LEFT UNDER CHEEK CX3 5FU   SCC (squamous cell carcinoma) 03/12/2021   well diff- right temple (CX35FU)   Squamous cell carcinoma of skin 05/03/2008   SCC INSITU RIGHT V OF CHEST TX EXC   Ulcer    Vitamin D deficiency    Past Surgical History:  Procedure Laterality Date   Arthroscopic knee surgery     left   cateract surgeries      bilateral   CHOLECYSTECTOMY     COLONOSCOPY WITH PROPOFOL  N/A 02/16/2017   Procedure: COLONOSCOPY WITH  PROPOFOL ;  Surgeon: Garrett Kallman, MD;  Location: [facility];  Service: Endoscopy;  Laterality: N/A;   GASTROSTOMY W/ FEEDING TUBE  [id number]   HERNIA REPAIR     Laparoscopic ventral   HIATAL HERNIA REPAIR  2004   HIATAL HERNIA REPAIR N/A 02/08/2013   Procedure:  REPAIR OFCURRENT HIATAL HERNIA, ;  Surgeon: Harlee Lichtenstein, MD;  Location: [facility];  Service: General;  Laterality: N/A;   LAPAROSCOPIC LYSIS OF ADHESIONS N/A 02/08/2013   Procedure: LAPAROSCOPIC LYSIS OF ADHESIONS;  Surgeon: Harlee Lichtenstein, MD;  Location: [facility];  Service: General;  Laterality: N/A;   LAPAROSCOPIC NISSEN FUNDOPLICATION  02/08/2013   Procedure: LAPAROSCOPIC NISSEN FUNDOPLICATION;  Surgeon: Harlee Lichtenstein, MD;  Location: [facility];  Service: General;;   Multiple oral surgeries  2002   SKIN CANCER EXCISION  10/2012   right leg-shin area   STOMACH SURGERY  2005   states his stomach had moved up toward his chest , some type of surgery performed   TONSILLECTOMY     UPPER GI ENDOSCOPY N/A 02/08/2013   Procedure: UPPER GI ENDOSCOPY;  Surgeon: Harlee Lichtenstein, MD;  Location: [facility];  Service: General;  Laterality: N/A;    Allergies  Allergies  Allergen Reactions  Ciprofloxacin  Anaphylaxis    Avoid due to myasthenia gravis   Quinolones Anaphylaxis    Avoid due to myasthenia gravis    History of Present Illness    [name] is a PMH of atrial fibrillation, dysphagia, GERD, Bell's palsy, BPH, macular degeneration, is status post cholecystectomy, gait abnormality, aspiration, HTN, and aortic valve stenosis and COVID-19 infection.  Echocardiogram 12/12/2020 showed normal biventricular function, mild LVH, G1 DD, mild aortic stenosis.  His cardiac event monitor for 2022 showed frequent PACs, occasional PVCs, and 6 episodes of SVT with the longest lasting 14 beats.  He was seen in follow-up by Dr. Alda Amas on 02/26/2021.  During that time he reported that he had been doing okay.  He reported having a COVID  infection in April.  He noted upper respiratory infection symptoms but did well.  He did not require hospitalization.  He did report some dyspnea but denied chest pain.  He denied palpitations.  His main issue was with feeling fatigued.  He had been started on a course of prednisone  for his myasthenia and reported feeling somewhat improved.  He presented to the clinic 10/23 for follow-up evaluation and stated he had noticed increased work of breathing and fatigue.  His increased work of breathing started  4 to 5 months prior.  He continued to be somewhat physically active walking around 2 miles per day which he tracked on his smart device.  He also noticed intermittent periods of heavy heartbeat that were brief and went away with rest.  He continued to have episodes of extreme fatigue which were relieved with about 10 minutes of rest.  We reviewed his history of myasthenia gravis.  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  I reviewed echocardiogram and how the exam may help to provide further insight into his symptoms.   I planned follow-up around 1-1/2 weeks.  His follow-up echocardiogram was reassuring 07/24/2022.  He presented to the clinic 08/26/22 for follow-up evaluation and stated he felt well.  He and his wife had been less active during the holidays.  We reviewed that his echocardiogram and he expressed understanding.  His blood pressure was well-controlled today at 130-132/70.  He did have right ankle edema which dissipated throughout the day with elevation.  follow-up in 6 months was planned.   He was seen in follow-up by Dr. Alda Amas on 09/03/2023.  During that time he reported that he was doing okay.  He occasionally noted shortness of breath.  He had not been exercising.  He had an oral surgery that week.  He denied chest pain, lightheadedness, syncope, palpitations and did note some lower extremity edema.  His weight was noted to be 151 pounds.  Plan  for repeat echocardiogram 12/25 to reevaluate aortic stenosis was made.  Follow-up in 6 months was planned.  He presents to the clinic today for follow-up evaluation and states***.  Today he denies chest pain,  fatigue, melena, hematuria, hemoptysis, diaphoresis, weakness, presyncope, syncope, orthopnea, and PND.   Home Medications    Prior to Admission medications   Medication Sig Start Date End Date Taking? Authorizing Provider  ASCORBIC ACID PO Take 250 mg by mouth 2 (two) times daily.    [provider]  COVID-19 mRNA bivalent vaccine, Pfizer, (PFIZER COVID-19 VAC BIVALENT) injection Inject into the muscle. 06/11/21   Liane Redman, MD  COVID-19 mRNA bivalent vaccine, Pfizer, (PFIZER COVID-19 VAC BIVALENT) injection Inject into the muscle. 01/16/22  Liane Redman, MD  hydrochlorothiazide  (HYDRODIURIL ) 25 MG tablet Take 1 tablet (25 mg total) by mouth daily. 04/07/16   Brian Campanile, MD  Multiple Vitamins-Minerals (ICAPS) CAPS Take 1 capsule by mouth 2 (two) times daily.    [provider]  mycophenolate  (CELLCEPT ) 500 MG tablet Take 1 tablet (500 mg total) by mouth daily. 04/29/22   Wess Hammed, NP  pantoprazole  (PROTONIX ) 40 MG tablet Take 40 mg by mouth daily. May take an additional 40mg s as needed for heartburn 09/27/13   [provider]  potassium chloride  (KLOR-CON ) 10 MEQ tablet TAKE 1 TABLET BY MOUTH ONCE DAILY 03/14/21   Brian Campanile, MD  Probiotic Product (ALIGN) 4 MG CAPS take 1 po qd 10/16/17   [provider]  pyridostigmine  (MESTINON ) 60 MG tablet Take 0.5 tablets (30 mg total) by mouth 2 (two) times daily as needed. 04/29/22   Wess Hammed, NP  tamsulosin (FLOMAX) 0.4 MG CAPS capsule 0.4 mg as needed.  10/24/17   [provider]  UNABLE TO FIND Take 1 tablet by mouth daily. Med Name:EyePromise - zeaxanthin 10mg  and lutein 10mg     [provider]  Vitamin D, Ergocalciferol, (DRISDOL) 50000 UNITS CAPS capsule Take  50,000 Units by mouth every 7 (seven) days. ON FRIDAYS    [provider]  zoledronic  acid (RECLAST ) 5 MG/100ML SOLN injection Inject 5 mg into the vein once. YEARLY    [provider]    Family History    Family History  Problem Relation Age of Onset   Diabetes Father    Heart attack Father    Breast cancer Sister    CAD Sister    COPD Sister    Prostate cancer Paternal Grandfather    Colon cancer Son 56   Myasthenia gravis Son    Colon cancer Son 80   He indicated that his mother is deceased. He indicated that his father is deceased. He indicated that only one of his two sisters is alive. He indicated that the status of his paternal grandfather is unknown. He indicated that only one of his two sons is alive.  Social History    Social History   Socioeconomic History   Marital status: Married    Spouse name: Benancio Bracket   Number of children: 3   Years of education: 16   Highest education level: Not on file  Occupational History   Occupation: Retired    Associate Professor: RETIRED  Tobacco Use   Smoking status: Former    Current packs/day: 0.00    Types: Cigarettes    Quit date: 09/15/1976    Years since quitting: 47.4   Smokeless tobacco: Never   Tobacco comments:    Quit 1980  Vaping Use   Vaping status: Never Used  Substance and Sexual Activity   Alcohol  use: No    Comment: Moderate alcohol , wine   Drug use: No   Sexual activity: Not on file  Other Topics Concern   Not on file  Social History Narrative   Lives at home, married   Patient is right-handed.   Patient drinks caffeine occasionally.      Social Drivers of Corporate investment banker Strain: Not on file  Food Insecurity: Not on file  Transportation Needs: Not on file  Physical Activity: Not on file  Stress: Not on file  Social Connections: Not on file  Intimate Partner Violence: Not on file     Review of Systems    General:  No chills, fever, night sweats or weight changes.   Cardiovascular:  No chest pain, dyspnea on exertion, edema, orthopnea, palpitations, paroxysmal nocturnal dyspnea. Dermatological: No rash, lesions/masses Respiratory: No cough, dyspnea Urologic: No hematuria, dysuria Abdominal:   No nausea, vomiting, diarrhea, bright red blood per rectum, melena, or hematemesis Neurologic:  No visual changes, wkns, changes in mental status. All other systems reviewed and are otherwise negative except as noted above.  Physical Exam    VS:  There were no vitals taken for this visit. , BMI There is no height or weight on file to calculate BMI. GEN: Well nourished, well developed, in no acute distress. HEENT: normal. Neck: Supple, no JVD, carotid bruits, or masses. Cardiac: RRR, 2/6 systolic murmur heard best along right sternal border, rubs, or gallops. No clubbing, cyanosis, slight ankle edema right greater than left.  Radials/DP/PT 2+ and equal bilaterally.  Respiratory:  Respirations regular and unlabored, clear to auscultation bilaterally. GI: Soft, nontender, nondistended, BS + x 4. MS: no deformity or atrophy. Skin: warm and dry, no rash. Neuro:  Strength and sensation are intact. Psych: Normal affect.  Accessory Clinical Findings    Recent Labs: 09/03/2023: Magnesium  2.2 12/17/2023: ALT 12; BUN 14; Creatinine, Ser 0.91; Hemoglobin 14.2; Platelets 221; Potassium 4.0; Sodium 139   Recent Lipid Panel    Component Value Date/Time   CHOL 118 02/14/2013 0530   TRIG 79 02/21/2013 0520    No BP recorded.  {Refresh Note OR Click here to enter BP  :1}***    ECG personally reviewed by me today-none today.  EKG 07/08/2022 normal sinus rhythm no ectopy 78 bpm- No acute changes  Echocardiogram 12/12/2020 IMPRESSIONS     1. Left ventricular ejection fraction, by estimation, is 60 to 65%. The  left ventricle has normal function. The left ventricle has no regional  wall motion abnormalities. There is mild left ventricular hypertrophy.  Left  ventricular diastolic parameters  are consistent with Grade I diastolic dysfunction (impaired relaxation).  The average left ventricular global longitudinal strain is -22.2 %. The  global longitudinal strain is normal.   2. Right ventricular systolic function is normal. The right ventricular  size is normal.   3. The mitral valve is normal in structure. No evidence of mitral valve  regurgitation. No evidence of mitral stenosis.   4. The aortic valve is calcified. There is mild calcification of the  aortic valve. There is mild thickening of the aortic valve. Aortic valve  regurgitation is not visualized. Mild aortic valve stenosis. Aortic valve  area, by VTI measures 1.93 cm.  Aortic valve mean gradient measures 14.0 mmHg. Aortic valve Vmax measures  2.56 m/s.   5. Aortic dilatation noted. There is mild dilatation of the aortic root,  measuring 37 mm.   6. The inferior vena cava is normal in size with greater than 50%  respiratory variability, suggesting right atrial pressure of 3 mmHg.   Comparison(s): 02/09/13 EF 55-60%.   Echocardiogram 07/24/2022 IMPRESSIONS     1. Left ventricular ejection fraction, by estimation, is 60 to 65%. The  left ventricle has normal function. The left ventricle has no regional  wall motion abnormalities. Left ventricular diastolic parameters are  indeterminate.   2. Right ventricular systolic function is normal. The right ventricular  size is normal. There is normal pulmonary artery systolic pressure.   3. The mitral valve is normal in structure. Mild mitral valve  regurgitation. No evidence of mitral stenosis.   4. The aortic valve is calcified.  There is moderate calcification of the  aortic valve. Aortic valve regurgitation is mild. Mild aortic valve  stenosis. Aortic valve area, by VTI measures 1.23 cm. Aortic valve mean  gradient measures 17.6 mmHg. Aortic  valve Vmax measures 2.80 m/s.   5. The inferior vena cava is normal in size with greater  than 50%  respiratory variability, suggesting right atrial pressure of 3 mmHg.   Comparison(s): Prior images reviewed side by side. 12/12/20 EF 60-65%. GLS  -22.2%. Mild AS mean PG, peak PG.   FINDINGS   Left Ventricle: Left ventricular ejection fraction, by estimation, is 60  to 65%. The left ventricle has normal function. The left ventricle has no  regional wall motion abnormalities. The left ventricular internal cavity  size was normal in size. There is   no left ventricular hypertrophy. Left ventricular diastolic parameters  are indeterminate.   Right Ventricle: The right ventricular size is normal. No increase in  right ventricular wall thickness. Right ventricular systolic function is  normal. There is normal pulmonary artery systolic pressure. The tricuspid  regurgitant velocity is 2.28 m/s, and   with an assumed right atrial pressure of 3 mmHg, the estimated right  ventricular systolic pressure is 23.8 mmHg.   Left Atrium: Left atrial size was normal in size.   Right Atrium: Right atrial size was normal in size.   Pericardium: There is no evidence of pericardial effusion.   Mitral Valve: The mitral valve is normal in structure. Mild mitral valve  regurgitation. No evidence of mitral valve stenosis.   Tricuspid Valve: The tricuspid valve is normal in structure. Tricuspid  valve regurgitation is not demonstrated. No evidence of tricuspid  stenosis.   Aortic Valve: The aortic valve is calcified. There is moderate  calcification of the aortic valve. Aortic valve regurgitation is mild.  Mild aortic stenosis is present. Aortic valve mean gradient measures 17.6  mmHg. Aortic valve peak gradient measures 31.3   mmHg. Aortic valve area, by VTI measures 1.23 cm.   Pulmonic Valve: The pulmonic valve was normal in structure. Pulmonic valve  regurgitation is trivial. No evidence of pulmonic stenosis.   Aorta: The aortic root is normal in size and structure.    Venous: The inferior vena cava is normal in size with greater than 50%  respiratory variability, suggesting right atrial pressure of 3 mmHg.   IAS/Shunts: No atrial level shunt detected by color flow Doppler.    Cardiac event monitor 01/02/2021  Frequent PACs (6.4%) Occasional PVCs (1.7%). 6 episodes of SVT, longest lasting 14 beats     Patch Wear Time:  13 days and 23 hours (2022-03-31T11:36:41-0400 to 2022-04-14T11:19:58-0400)   Patient had a min HR of 44 bpm, max HR of 160 bpm, and avg HR of 75 bpm. Predominant underlying rhythm was Sinus Rhythm. 6 Supraventricular Tachycardia runs occurred, the run with the fastest interval lasting 10 beats with a max rate of 160 bpm, the  longest lasting 14 beats with an avg rate of 122 bpm. Isolated SVEs were frequent (6.4%, [id number]), SVE Couplets were rare (<1.0%, 432), and SVE Triplets were rare (<1.0%, 15). Isolated VEs were occasional (1.7%, [id number]), VE Couplets were rare (<1.0%, 396),  and VE Triplets were rare (<1.0%, 17). Ventricular Bigeminy and Trigeminy were present. Difficulty discerning atrial activity making definitive diagnosis difficult to ascertain. Assessment & Plan   1. Aortic valve stenosis-reports being somewhat physically active.  Does note some increased work of breathing with increased physical activity.  Breathing at baseline  at rest.  Echocardiogram 3/22 showed mild aortic stenosis.  Mild aortic stenosis noted on 07/24/2022 echocardiogram. Repeat echocardiogram 12/25  DOE, fatigue-denies increased fatigue or activity intolerance.   Echocardiogram 07/24/2022 showed an LVEF of 60-65% intermediate diastolic parameters and mild mitral valvular regurgitation. Reviewed reports of regular physical activity Heart healthy low-sodium diet   Atrial flutter-HR today 7***8.   Denies recent episodes of accelerated or irregular heartbeat.    CHA2DS2-VASc score 3.  Patient previously not started on anticoagulation.  No identified recurrence of  atrial flutter.  Atrial flutter occurred in the setting of hernia surgery 2014.  He has not had reoccurrence. Continue to monitor Avoid triggers caffeine, chocolate, EtOH, dehydration etc.-reviewed Increase physical activity as tolerated  Essential hypertension-BP today 132***/70.  Maintain blood pressure log Continue HCTZ Heart healthy low-sodium diet Increase physical activity as tolerated Repeat BMP    Disposition: Follow-up with Dr. Alda Amas or me in 6 months.  Chet Cota. Terrence Pizana NP-C     02/21/2024, 7:10 PM [facility] [address] Office [phone] Fax [phone]  Notice: This dictation was prepared with Dragon dictation along with smaller phrase technology. Any transcriptional errors that result from this process are unintentional and may not be corrected upon review.  I spent 14 ***minutes examining this patient, reviewing medications, and using patient centered shared decision making involving her cardiac care.  Prior to her visit I spent greater than 20 minutes reviewing her past medical history,  medications, and prior cardiac tests.

## 2024-02-22 ENCOUNTER — Ambulatory Visit: Admitting: Adult Health

## 2024-02-24 ENCOUNTER — Encounter: Payer: Self-pay | Admitting: General Practice

## 2024-02-24 ENCOUNTER — Ambulatory Visit: Attending: General Practice | Admitting: General Practice

## 2024-02-24 VITALS — BP 128/78 | HR 76 | Ht 70.0 in | Wt 152.6 lb

## 2024-02-24 DIAGNOSIS — I350 Nonrheumatic aortic (valve) stenosis: Secondary | ICD-10-CM | POA: Diagnosis not present

## 2024-02-24 DIAGNOSIS — I4891 Unspecified atrial fibrillation: Secondary | ICD-10-CM | POA: Diagnosis not present

## 2024-02-24 DIAGNOSIS — I4892 Unspecified atrial flutter: Secondary | ICD-10-CM

## 2024-02-24 DIAGNOSIS — I10 Essential (primary) hypertension: Secondary | ICD-10-CM

## 2024-02-24 DIAGNOSIS — R0609 Other forms of dyspnea: Secondary | ICD-10-CM

## 2024-02-24 NOTE — Patient Instructions (Addendum)
 Medication Instructions:  Your physician recommends that you continue on your current medications as directed. Please refer to the Current Medication list given to you today. *If you need a refill on your cardiac medications before your next appointment, please call your pharmacy*  Lab Work: None ordered If you have labs (blood work) drawn today and your tests are completely normal, you will receive your results only by: MyChart Message (if you have MyChart) OR A paper copy in the mail If you have any lab test that is abnormal or we need to change your treatment, we will call you to review the results.  Testing/Procedures: None Ordered   Follow-Up: At [facility], you and your health needs are our priority.  As part of our continuing mission to provide you with exceptional heart care, our providers are all part of one team.  This team includes your primary Cardiologist (physician) and Advanced Practice Providers or APPs (Physician Assistants and Nurse Practitioners) who all work together to provide you with the care you need, when you need it.  Your next appointment:   6 month(s)  Provider:   Wendie Hamburg, MD    We recommend signing up for the patient portal called MyChart.  Sign up information is provided on this After Visit Summary.  MyChart is used to connect with patients for Virtual Visits (Telemedicine).  Patients are able to view lab/test results, encounter notes, upcoming appointments, etc.  Non-urgent messages can be sent to your provider as well.   To learn more about what you can do with MyChart, go to [email or web address].   Other Instructions Continue physical activity

## 2024-02-26 DIAGNOSIS — H353112 Nonexudative age-related macular degeneration, right eye, intermediate dry stage: Secondary | ICD-10-CM | POA: Diagnosis not present

## 2024-02-26 DIAGNOSIS — D3132 Benign neoplasm of left choroid: Secondary | ICD-10-CM | POA: Diagnosis not present

## 2024-02-26 DIAGNOSIS — H43813 Vitreous degeneration, bilateral: Secondary | ICD-10-CM | POA: Diagnosis not present

## 2024-02-26 DIAGNOSIS — H353223 Exudative age-related macular degeneration, left eye, with inactive scar: Secondary | ICD-10-CM | POA: Diagnosis not present

## 2024-04-12 ENCOUNTER — Ambulatory Visit (HOSPITAL_BASED_OUTPATIENT_CLINIC_OR_DEPARTMENT_OTHER)
Admission: RE | Admit: 2024-04-12 | Discharge: 2024-04-12 | Disposition: A | Discharge status: Home / Self Care | Source: Ambulatory Visit

## 2024-04-12 ENCOUNTER — Emergency Department (HOSPITAL_COMMUNITY)
Admission: EM | Admit: 2024-04-12 | Discharge: 2024-04-12 | Disposition: A | Discharge status: Home / Self Care | Attending: Emergency Medicine | Admitting: Emergency Medicine

## 2024-04-12 ENCOUNTER — Emergency Department (HOSPITAL_COMMUNITY)

## 2024-04-12 ENCOUNTER — Encounter (HOSPITAL_COMMUNITY): Payer: Self-pay | Admitting: Emergency Medicine

## 2024-04-12 DIAGNOSIS — S8012XA Contusion of left lower leg, initial encounter: Secondary | ICD-10-CM | POA: Diagnosis not present

## 2024-04-12 DIAGNOSIS — S8992XA Unspecified injury of left lower leg, initial encounter: Secondary | ICD-10-CM

## 2024-04-12 DIAGNOSIS — S8002XA Contusion of left knee, initial encounter: Secondary | ICD-10-CM | POA: Diagnosis not present

## 2024-04-12 DIAGNOSIS — Z87891 Personal history of nicotine dependence: Secondary | ICD-10-CM | POA: Diagnosis not present

## 2024-04-12 DIAGNOSIS — T148XXA Other injury of unspecified body region, initial encounter: Secondary | ICD-10-CM

## 2024-04-12 DIAGNOSIS — Z85828 Personal history of other malignant neoplasm of skin: Secondary | ICD-10-CM | POA: Diagnosis not present

## 2024-04-12 DIAGNOSIS — R011 Cardiac murmur, unspecified: Secondary | ICD-10-CM | POA: Diagnosis not present

## 2024-04-12 DIAGNOSIS — Y9301 Activity, walking, marching and hiking: Secondary | ICD-10-CM | POA: Insufficient documentation

## 2024-04-12 DIAGNOSIS — I10 Essential (primary) hypertension: Secondary | ICD-10-CM | POA: Diagnosis not present

## 2024-04-12 DIAGNOSIS — R609 Edema, unspecified: Secondary | ICD-10-CM | POA: Diagnosis not present

## 2024-04-12 DIAGNOSIS — W01198A Fall on same level from slipping, tripping and stumbling with subsequent striking against other object, initial encounter: Secondary | ICD-10-CM | POA: Insufficient documentation

## 2024-04-12 DIAGNOSIS — Z79899 Other long term (current) drug therapy: Secondary | ICD-10-CM | POA: Diagnosis not present

## 2024-04-12 LAB — CBC WITH DIFFERENTIAL/PLATELET
Abs Immature Granulocytes: 0.02 K/uL (ref 0.00–0.07)
Basophils Absolute: 0 K/uL (ref 0.0–0.1)
Basophils Relative: 0 %
Eosinophils Absolute: 0.1 K/uL (ref 0.0–0.5)
Eosinophils Relative: 3 %
HCT: 39.5 % (ref 39.0–52.0)
Hemoglobin: 13.3 g/dL (ref 13.0–17.0)
Immature Granulocytes: 0 %
Lymphocytes Relative: 20 %
Lymphs Abs: 1 K/uL (ref 0.7–4.0)
MCH: 32.8 pg (ref 26.0–34.0)
MCHC: 33.7 g/dL (ref 30.0–36.0)
MCV: 97.3 fL (ref 80.0–100.0)
Monocytes Absolute: 0.5 K/uL (ref 0.1–1.0)
Monocytes Relative: 10 %
Neutro Abs: 3.4 K/uL (ref 1.7–7.7)
Neutrophils Relative %: 67 %
Platelets: 232 K/uL (ref 150–400)
RBC: 4.06 MIL/uL — ABNORMAL LOW (ref 4.22–5.81)
RDW: 13.2 % (ref 11.5–15.5)
WBC: 5.1 K/uL (ref 4.0–10.5)
nRBC: 0 % (ref 0.0–0.2)

## 2024-04-12 LAB — BASIC METABOLIC PANEL WITH GFR
Anion gap: 12 (ref 5–15)
BUN: 16 mg/dL (ref 8–23)
CO2: 24 mmol/L (ref 22–32)
Calcium: 8.9 mg/dL (ref 8.9–10.3)
Chloride: 99 mmol/L (ref 98–111)
Creatinine, Ser: 1 mg/dL (ref 0.61–1.24)
GFR, Estimated: >60 mL/min (ref >60)
Glucose, Bld: 97 mg/dL (ref 70–99)
Potassium: 3.8 mmol/L (ref 3.5–5.1)
Sodium: 135 mmol/L (ref 135–145)

## 2024-04-12 NOTE — ED Triage Notes (Signed)
 Pt comes in with wife after a fall last month 8 days ago. Sunday noticed swelling to left knee and bruising. Swelling to knee and lower leg, bruising noted to left leg.  No pain noted.

## 2024-04-12 NOTE — Discharge Instructions (Addendum)
 Your x-ray did not show any new findings.  Unfortunately, the study to look for blood clots in your leg did not officially result yet.  I suspect that the result will be available tomorrow.  You can follow-up on this result through MyChart or PCP.  The bruising on your leg is the natural course of healing following an injury.  This should improve with time.  Elevate your leg when possible to promote healing. Return to the emergency department for any worsening or worrisome symptoms.

## 2024-04-12 NOTE — ED Provider Notes (Signed)
 [facility] AT [facility] Provider Note  CSN: [id number] Arrival date & time: 04/12/24 9084  Chief Complaint(s) Fall  HPI Victor Sutton is a 89 y.o. male with past medical history as below, significant for myasthenia gravis, hypertension, Bell's palsy, IDA, B12 deficiency who presents to the ED with complaint of fall, leg injury  Patient reports around a week ago he was walking around the track where he typically exercises, he had a ground-level fall, hit his left knee.  Was able to continue walking afterwards without much difficulty.  He has some nagging pain to his left knee after the fall, no some swelling to his knee.  He exercised/walked around 2 more days last week with mildly reduced exercise tolerance due to discomfort in his leg and some increased fatigue.  He had a pedicure/leg massage yesterday, following the pedicure he noticed increased bruising to his left leg.  Feels like his left leg is swollen but is still able to ambulate, no sig back pain reported. He has some tingling to his toes but o/w has equal and symmetric sensation to his legs  Past Medical History Past Medical History:  Diagnosis Date   Adenomatous colon polyp    B12 deficiency    HIstory   Basal cell carcinoma 08/18/2006   BASOSQUAMOUS LEFT OUTER FOREHEAD TX CX3 , EXC   BPH (benign prostatic hyperplasia)    Ole ulcer    Cancer [facility])    precancerous skin lesions on leg    Cataract 2009   bilateral cataract surgery   Delayed gastric emptying    Disturbance of salivary secretion 06/20/2013   Dysphagia, pharyngoesophageal phase 03/16/2013   Dysphonia 03/16/2013   Gait abnormality 11/29/2019   GERD (gastroesophageal reflux disease)    Hiatal hernia    hiatial hernia repair  02/08/13   History of blood transfusion    Hyperlipidemia    not on medication   Hypertension    Iron  deficiency anemia    Leukoplakia 2001   Treated for   Macular degeneration    (L) wet, (R)  dry   Myasthenia gravis (HCC) 03/21/2013   Peripheral neuropathy    Mild History of   Polyneuritis 1965   Resolved   SCC (squamous cell carcinoma) 03/12/2011   SCC LEFT INNER CHEEK CX3 5FU   SCC (squamous cell carcinoma) 08/24/2012   RIGHT LOWER SHIN TX WITH BX MOHS   SCC (squamous cell carcinoma) 07/26/2014   LEFT UNDER CHEEK CX3 5FU   SCC (squamous cell carcinoma) 03/12/2021   well diff- right temple (CX35FU)   Squamous cell carcinoma of skin 05/03/2008   SCC INSITU RIGHT V OF CHEST TX EXC   Ulcer    Vitamin D deficiency    Patient Active Problem List   Diagnosis Date Noted   Post covid-19 condition, unspecified 02/18/2021   Gait abnormality 11/29/2019   Disturbance of salivary secretion 06/20/2013   Myasthenia gravis (HCC) 03/21/2013   Dysphonia 03/16/2013   Dysphagia, pharyngoesophageal phase 03/16/2013   Dysphagia, oropharyngeal phase 03/07/2013   Aspiration into airway 02/12/2013   Protein-calorie malnutrition, severe (HCC) 02/11/2013   Atrial fibrillation (HCC) 02/08/2013   Acute respiratory failure (HCC) 02/08/2013   GERD (gastroesophageal reflux disease)    Hiatal hernia-recurrent s/p laparoscopic repair and Nissen fundoplication on 02/08/13 01/11/2013   Bell's palsy 01/01/2013   Iron  deficiency anemia 09/01/2012   S/P repair of paraesophageal hernia 09/01/2012   Hx of adenomatous colonic polyps 09/01/2012   BPH (benign prostatic  hyperplasia) 09/01/2012   B12 deficiency 09/01/2012   Macular degeneration 09/01/2012   S/P cholecystectomy 09/01/2012   Home Medication(s) Prior to Admission medications   Medication Sig Start Date End Date Taking? Authorizing Provider  ascorbic acid (VITAMIN C) 250 MG tablet Take 250 mg by mouth 2 (two) times daily.    [provider]  chlorhexidine  (PERIDEX ) 0.12 % solution SWISH 1 CAPFUL IN MOUTH AND HOLD FOR 2 MINUTES, THEN SPIT OUT. USE TWICE A DAY. 08/24/23   [provider]  denosumab  (PROLIA ) 60 MG/ML SOSY  injection  04/02/23   [provider]  hydrochlorothiazide  (HYDRODIURIL ) 25 MG tablet Take 1 tablet (25 mg total) by mouth daily. 04/07/16   Jenel Carlin POUR, MD  Multiple Vitamins-Minerals (ICAPS) CAPS Take 1 capsule by mouth 2 (two) times daily.    [provider]  mycophenolate  (CELLCEPT ) 250 MG capsule Take 1 capsule (250 mg total) by mouth daily. 12/17/23   Gayland Lauraine PARAS, NP  pantoprazole  (PROTONIX ) 40 MG tablet Take 40 mg by mouth daily. May take an additional 40mg s as needed for heartburn 09/27/13   [provider]  potassium chloride  (KLOR-CON ) 10 MEQ tablet TAKE 1 TABLET BY MOUTH ONCE DAILY 03/14/21   Jenel Carlin POUR, MD  Probiotic Product (ALIGN) 4 MG CAPS take 1 po qd 10/16/17   [provider]  pyridostigmine  (MESTINON ) 60 MG tablet Take 0.5 tablets (30 mg total) by mouth 2 (two) times daily as needed. 05/26/23   Gayland Lauraine PARAS, NP  tamsulosin (FLOMAX) 0.4 MG CAPS capsule 0.4 mg as needed.  10/24/17   [provider]  UNABLE TO FIND Take 1 tablet by mouth daily. Med Name:EyePromise - zeaxanthin 10mg  and lutein 10mg     [provider]  Vitamin D, Ergocalciferol, (DRISDOL) 50000 UNITS CAPS capsule Take 50,000 Units by mouth every 7 (seven) days. ON FRIDAYS    [provider]  Zeaxanthin POWD Take by mouth. 04/18/14   [provider]                                                                                                                                    Past Surgical History Past Surgical History:  Procedure Laterality Date   Arthroscopic knee surgery     left   cateract surgeries      bilateral   CHOLECYSTECTOMY     COLONOSCOPY WITH PROPOFOL  N/A 02/16/2017   Procedure: COLONOSCOPY WITH PROPOFOL ;  Surgeon: Vicci Gladis POUR, MD;  Location: [facility];  Service: Endoscopy;  Laterality: N/A;   GASTROSTOMY W/ FEEDING TUBE  [id number]   HERNIA REPAIR     Laparoscopic ventral   HIATAL HERNIA REPAIR  2004   HIATAL  HERNIA REPAIR N/A 02/08/2013   Procedure:  REPAIR OFCURRENT HIATAL HERNIA, ;  Surgeon: Krystal PARAS Russell, MD;  Location: [facility] ORS;  Service: General;  Laterality: N/A;   LAPAROSCOPIC LYSIS OF ADHESIONS N/A 02/08/2013  Procedure: LAPAROSCOPIC LYSIS OF ADHESIONS;  Surgeon: Krystal JINNY Russell, MD;  Location: [facility] OR[facility];  Service: General;  Laterality: N/A;   LAPAROSCOPIC NISSEN FUNDOPLICATION  02/08/2013   Procedure: LAPAROSCOPIC NISSEN FUNDOPLICATION;  Surgeon: Krystal JINNY Russell, MD;  Location: [facility] OR[facility];  Service: General;;   Multiple oral surgeries  2002   SKIN CANCER EXCISION  10/2012   right leg-shin area   STOMACH SURGERY  2005   states his stomach had moved up toward his chest , some type of surgery performed   TONSILLECTOMY     UPPER GI ENDOSCOPY N/A 02/08/2013   Procedure: UPPER GI ENDOSCOPY;  Surgeon: Krystal JINNY Russell, MD;  Location: [facility] OR[facility];  Service: General;  Laterality: N/A;   Family History Family History  Problem Relation Age of Onset   Diabetes Father    Heart attack Father    Breast cancer Sister    CAD Sister    COPD Sister    Prostate cancer Paternal Grandfather    Colon cancer Son 61   Myasthenia gravis Son    Colon cancer Son 39    Social History Social History   Tobacco Use   Smoking status: Former    Current packs/day: 0.00    Types: Cigarettes    Quit date: 09/15/1976    Years since quitting: 47.6   Smokeless tobacco: Never   Tobacco comments:    Quit 1980  Vaping Use   Vaping status: Never Used  Substance Use Topics   Alcohol  use: No    Comment: Moderate alcohol , wine   Drug use: No   Allergies Ciprofloxacin  and Quinolones  Review of Systems A thorough review of systems was obtained and all systems are negative except as noted in the HPI and PMH.   Physical Exam Vital Signs  I have reviewed the triage vital signs BP 122/83 (BP Location: Right Arm)   Pulse 64   Temp 97.6 F (36.4 C) (Oral)   Resp 17   Ht 5' 10 (1.778 m)   Wt 68.9 kg   SpO2 98%    BMI 21.81 kg/m  Physical Exam Vitals and nursing note reviewed.  Constitutional:      General: He is not in acute distress.    Appearance: Normal appearance. He is well-developed. He is not ill-appearing.  HENT:     Head: Normocephalic and atraumatic.     Right Ear: External ear normal.     Left Ear: External ear normal.     Nose: Nose normal.     Mouth/Throat:     Mouth: Mucous membranes are moist.  Eyes:     General: No scleral icterus.       Right eye: No discharge.        Left eye: No discharge.  Cardiovascular:     Rate and Rhythm: Normal rate.     Heart sounds: Murmur heard.     Systolic murmur is present.  Pulmonary:     Effort: Pulmonary effort is normal. No respiratory distress.     Breath sounds: No stridor.  Abdominal:     General: Abdomen is flat. There is no distension.     Tenderness: There is no guarding.  Musculoskeletal:        General: No deformity.     Cervical back: No rigidity.       Legs:     Comments: Bruising noted to left lower extremity, see photo  DP pulses palpable and symmetric bilateral  Capillary refill is brisk to toes  bilateral  Achilles, quadricep and patella tendons intact bilateral  Full active / passive range of motion to left knee without discomfort.  No significant pain provocative testing to the left knee, no significant ligament laxity to left knee  Trace edema to left lower extremity  Skin:    General: Skin is warm and dry.     Coloration: Skin is not cyanotic, jaundiced or pale.  Neurological:     Mental Status: He is alert and oriented to person, place, and time.     GCS: GCS eye subscore is 4. GCS verbal subscore is 5. GCS motor subscore is 6.     Cranial Nerves: Cranial nerves 2-12 are intact. No dysarthria or facial asymmetry.     Sensory: Sensation is intact. No sensory deficit.     Motor: Motor function is intact. No weakness or tremor.     Coordination: Coordination is intact.     Comments: Strength 5/5 to  BLUE/BLLE, equal and symmetric   Gait testing deferred secondary to patient safety.   Psychiatric:        Speech: Speech normal.        Behavior: Behavior normal. Behavior is cooperative.        ED Results and Treatments Labs (all labs ordered are listed, but only abnormal results are displayed) Labs Reviewed  CBC WITH DIFFERENTIAL/PLATELET - Abnormal; Notable for the following components:      Result Value   RBC 4.06 (*)    All other components within normal limits  BASIC METABOLIC PANEL WITH GFR                                                                                                                          Radiology DG Knee Complete 4 Views Left Result Date: 04/12/2024 CLINICAL DATA:  fall, left knee swelling and bruising EXAM: LEFT KNEE - COMPLETE 4+ VIEW COMPARISON:  None Available. FINDINGS: Osteopenia.No acute fracture or dislocation. No joint effusion. There is no evidence of arthropathy or other focal bone abnormality. Diffuse peripheral vascular atherosclerosis. IMPRESSION: Osteopenia.  No acute fracture or dislocation. Electronically Signed   By: Rogelia Myers M.D.   On: 04/12/2024 12:20    Pertinent labs & imaging results that were available during my care of the patient were reviewed by me and considered in my medical decision making (see MDM for details).  Medications Ordered in ED Medications - No data to display  Procedures Procedures  (including critical care time)  Medical Decision Making / ED Course    Medical Decision Making:    Victor Sutton is a 89 y.o. male with past medical history as below, significant for myasthenia gravis, hypertension, Bell's palsy, IDA, B12 deficiency who presents to the ED with complaint of fall, leg injury. The complaint involves an extensive differential diagnosis and also carries with  it a high risk of complications and morbidity.  Serious etiology was considered. Ddx includes but is not limited to: Fracture, dislocation, DVT, vascular injury, etc  Complete initial physical exam performed, notably the patient was in no acute distress, resting comfortably in stretcher.    Reviewed and confirmed nursing documentation for past medical history, family history, social history.  Vital signs reviewed.    Fall w/ left knee injury Left leg bruising > - Apparent mechanical fall with left knee injury, bruising noted to leg and small effusion/hematoma noted to left knee. Compartments are soft, no pain to leg - LE NVI, strength symmetric bilateral lower extremities, DP pulse easily palpable and symmetric bilateral - XR neg - duplex pending - Hgb/PLT stable; no thinners (hx aflutter but not on [facility], f/w cardiology)  Clinical Course as of 04/12/24 1551  Tue Apr 12, 2024  1528 Platelets: 232 [SG]  1529 Hemoglobin: 13.3 stable [SG]    Clinical Course User Index [SG] Elnor Jayson LABOR, DO     Handoff dr Melvenia pending duplex               Additional history obtained: -Additional history obtained from spouse -External records from outside source obtained and reviewed including: Chart review including previous notes, labs, imaging, consultation notes including  Home meds Prior labs Cardiology documentation    Lab Tests: -I ordered, reviewed, and interpreted labs.   The pertinent results include:   Labs Reviewed  CBC WITH DIFFERENTIAL/PLATELET - Abnormal; Notable for the following components:      Result Value   RBC 4.06 (*)    All other components within normal limits  BASIC METABOLIC PANEL WITH GFR    Notable for labs stable  EKG   EKG Interpretation Date/Time:    Ventricular Rate:    PR Interval:    QRS Duration:    QT Interval:    QTC Calculation:   R Axis:      Text Interpretation:           Imaging Studies ordered: I ordered imaging studies  including xr knee, duplex LE >duplex pending I independently visualized the following imaging with scope of interpretation limited to determining acute life threatening conditions related to emergency care; findings noted above I agree with the radiologist interpretation If any imaging was obtained with contrast I closely monitored patient for any possible adverse reaction a/w contrast administration in the emergency department   Medicines ordered and prescription drug management: No orders of the defined types were placed in this encounter.   -I have reviewed the patients home medicines and have made adjustments as needed   Consultations Obtained: na   Cardiac Monitoring: Continuous pulse oximetry interpreted by myself, 99% on ra.    Social Determinants of Health:  Diagnosis or treatment significantly limited by social determinants of health: former smoker   Reevaluation: After the interventions noted above, I reevaluated the patient and found that they have stayed the same  Co morbidities that complicate the patient evaluation  Past Medical History:  Diagnosis Date   Adenomatous colon polyp  B12 deficiency    HIstory   Basal cell carcinoma 08/18/2006   BASOSQUAMOUS LEFT OUTER FOREHEAD TX CX3 , EXC   BPH (benign prostatic hyperplasia)    Ole ulcer    Cancer [facility])    precancerous skin lesions on leg    Cataract 2009   bilateral cataract surgery   Delayed gastric emptying    Disturbance of salivary secretion 06/20/2013   Dysphagia, pharyngoesophageal phase 03/16/2013   Dysphonia 03/16/2013   Gait abnormality 11/29/2019   GERD (gastroesophageal reflux disease)    Hiatal hernia    hiatial hernia repair  02/08/13   History of blood transfusion    Hyperlipidemia    not on medication   Hypertension    Iron  deficiency anemia    Leukoplakia 2001   Treated for   Macular degeneration    (L) wet, (R) dry   Myasthenia gravis (HCC) 03/21/2013   Peripheral  neuropathy    Mild History of   Polyneuritis 1965   Resolved   SCC (squamous cell carcinoma) 03/12/2011   SCC LEFT INNER CHEEK CX3 5FU   SCC (squamous cell carcinoma) 08/24/2012   RIGHT LOWER SHIN TX WITH BX MOHS   SCC (squamous cell carcinoma) 07/26/2014   LEFT UNDER CHEEK CX3 5FU   SCC (squamous cell carcinoma) 03/12/2021   well diff- right temple (CX35FU)   Squamous cell carcinoma of skin 05/03/2008   SCC INSITU RIGHT V OF CHEST TX EXC   Ulcer    Vitamin D deficiency       Dispostion: Disposition decision including need for hospitalization was considered, and patient disposition pending at time of sign out.    Final Clinical Impression(s) / ED Diagnoses Final diagnoses:  Hematoma  Injury of left knee, initial encounter        Elnor Jayson LABOR, DO 04/12/24 1551

## 2024-04-12 NOTE — ED Provider Notes (Signed)
 Care of patient assumed from Dr. Elnor.  This patient presents for swelling and bruising to his left leg following a fall a week ago.  He has been ambulatory.  X-ray imaging is negative.  Currently awaiting DVT study result. Physical Exam  BP 122/83 (BP Location: Right Arm)   Pulse 64   Temp 97.6 F (36.4 C) (Oral)   Resp 17   Ht 5' 10 (1.778 m)   Wt 68.9 kg   SpO2 98%   BMI 21.81 kg/m   Physical Exam Vitals and nursing note reviewed.  Constitutional:      General: He is not in acute distress.    Appearance: Normal appearance. He is well-developed. He is not ill-appearing, toxic-appearing or diaphoretic.  HENT:     Head: Normocephalic and atraumatic.     Right Ear: External ear normal.     Left Ear: External ear normal.     Nose: Nose normal.     Mouth/Throat:     Mouth: Mucous membranes are moist.  Eyes:     Extraocular Movements: Extraocular movements intact.     Conjunctiva/sclera: Conjunctivae normal.  Cardiovascular:     Rate and Rhythm: Normal rate and regular rhythm.  Pulmonary:     Effort: Pulmonary effort is normal. No respiratory distress.  Abdominal:     General: There is no distension.     Palpations: Abdomen is soft.     Tenderness: There is no abdominal tenderness.  Musculoskeletal:        General: No swelling, tenderness or deformity. Normal range of motion.     Cervical back: Normal range of motion and neck supple.  Skin:    General: Skin is warm and dry.     Findings: Bruising present.  Neurological:     General: No focal deficit present.     Mental Status: He is alert and oriented to person, place, and time.  Psychiatric:        Mood and Affect: Mood normal.        Behavior: Behavior normal.     Procedures  Procedures  ED Course / MDM   Clinical Course as of 04/12/24 1531  Tue Apr 12, 2024  1528 Platelets: 232 [SG]  1529 Hemoglobin: 13.3 stable [SG]    Clinical Course User Index [SG] Elnor Jayson LABOR, DO   Medical Decision  Making Amount and/or Complexity of Data Reviewed Labs: ordered. Decision-making details documented in ED Course. Radiology: ordered.   Unfortunately, DVT study did not result.  On assessment, patient is resting comfortably.  Signs remain normal.  On inspection of his leg, there is bruising but no significant swelling or tenderness.  He has been ambulatory on it without any discomfort.  I suspect bruising is natural course following his recent injury.  On personal review of ultrasound, there does not appear to be any noncompressible areas.  Patient to follow-up on results through PCP and/or MyChart.  He was discharged in stable condition.       Melvenia Motto, MD 04/12/24 [phone]

## 2024-04-13 ENCOUNTER — Other Ambulatory Visit (HOSPITAL_COMMUNITY): Payer: Self-pay | Admitting: Emergency Medicine

## 2024-04-13 DIAGNOSIS — R609 Edema, unspecified: Secondary | ICD-10-CM

## 2024-06-14 ENCOUNTER — Other Ambulatory Visit: Payer: Self-pay

## 2024-06-14 ENCOUNTER — Ambulatory Visit: Admitting: Neurology

## 2024-06-14 ENCOUNTER — Encounter: Payer: Self-pay | Admitting: Neurology

## 2024-06-14 VITALS — BP 110/68 | Ht 69.0 in | Wt 149.0 lb

## 2024-06-14 DIAGNOSIS — G7000 Myasthenia gravis without (acute) exacerbation: Secondary | ICD-10-CM

## 2024-06-14 DIAGNOSIS — R269 Unspecified abnormalities of gait and mobility: Secondary | ICD-10-CM

## 2024-06-14 MED ORDER — COMIRNATY 30 MCG/0.3ML IM SUSY
0.3000 mL | PREFILLED_SYRINGE | Freq: Once | INTRAMUSCULAR | 0 refills | Status: AC
  Filled 2024-06-14: qty 0.3, 1d supply, fill #0

## 2024-06-14 NOTE — Progress Notes (Signed)
 ASSESSMENT AND PLAN 89 y.o. year old male   1.  Seropositive myasthenia gravis -Diagnosed in 2014, at his worst significant dysarthria, dysphagia, required feeding tube for a few months, responded well to prednisone , tapered off -Reduced cellcept  to 250mg  from 500 mg daily since Sept 2024 without worsening of symptoms, will stop it. -No longer takes Mestinon    --Call for worsening muscle weakness  2.  Lumbar stenosis Mild to Moderate spinal stenosis at L2-L3, L3-L4, potential to affect L3 and L4 nerve roots Continue moderate exercise.    DIAGNOSTIC DATA (LABS, IMAGING, TESTING) - I reviewed patient records, labs, notes, testing and imaging myself where available.    HISTORY  Victor Sutton is a 89 year old male, follow-up for seropositive generalized myasthenia gravis,   I reviewed and summarized the referring note.  Past medical history Hypertension Prostate hypertrophy   Patient's and his family noted as early as 2010, he was noted to have mild gait abnormality, in 2014, he developed facial weakness, significant bulbar weakness, dysarthria, dysphagia, to the point of requiring feeding tube, eventually he was seen by Dr. Jenel in July 2014, was diagnosed with seropositive generalized myasthenia gravis  He was treated with prednisone  30 mg daily, Mestinon , began to show significant improvement, later CellCept  500 mg twice daily was added on, since 2014, about 6 months into treatment, he showed significant improvement, then developed bilateral lower extremity weakness, was considered due to high dose of steroid treatment, began steroid tapering, eventually was able to taper off steroid at the very slow pace in May 2017 without recurrent weakness, CellCept  later on was decreased to 500 mg daily, he was able to taper off Mestinon  as needed,  Over the years, his symptoms is well controlled, highly functioning, but he suffered COVID in May 2022, reported chest cold-like symptoms,  only last for few days  But ever since then, he noticed fatigue, on further questioning, he described overwhelming waves of unsteadiness, tightness sensation over his body, he would feel his legs give out underneath him if he is in a standing position, often helped by sitting down,  At last visit September 2022, he was seen by [facility], restarted Mestinon  60 mg half tablets twice daily, patient reported that it did help his symptoms  He also complains of urinary frequency, frequent nocturia, occasionally incontinence, low back pain radiating pain to left hip,  Update April 29, 2022 SS: Acetylcholine receptor binding was positive 4.72 , CK TSH were normal (10/24/21). He did 4 months PT, was helpful, more confidence, balance is better. Yesterday walked 3 miles with daily activities. Is currently taking 500 mg at 4 AM when he gets up to urinate. Tried to take 250 mg twice daily with food, upset his stomach. Want to go back to 500 mg daily. Denies any MG symptoms, occasional staggers. Taking Mestinon  30 mg 2 times daily. Denies any significant back pain. Planning to get back into walking [place] [employer].  February 2023 MRI cervical spine showed mild degenerative changes, no evidence of spinal cord compression  MRI lumbar spine showed moderate spinal stenosis at L2-3 level, right greater than left foraminal stenosis  UPDATE Sept 30 2025: His myasthenia gravis symptoms is under excellent control, the CellCept  dosage was decreased from 500 to 250 mg daily since September 2024, there is no worsening of his symptoms  He is no longer on Mestinon , just recover from upper respiratory, mild deconditioning, cannot ambulate without assistant  PHYSICAL EXAMNIATION:    06/14/2024  11:38 AM 04/12/2024    6:15 PM 04/12/2024    2:55 PM  Vitals with BMI  Height 5' 9    Weight 149 lbs    BMI 21.99    Systolic 110 124 877  Diastolic 68 86 83  Pulse  69 64     Gen: NAD, conversant, well nourised, well  groomed                     Cardiovascular: Regular rate rhythm, no peripheral edema, warm, nontender. Eyes: Conjunctivae clear without exudates or hemorrhage Neck: Supple, no carotid bruits. Pulmonary: Clear to auscultation bilaterally   NEUROLOGICAL EXAM:  MENTAL STATUS: Speech/cognition: Awake, alert oriented to history taking and casual conversation  CRANIAL NERVES: CN II: Visual fields are full to confrontation.  Pupils are round equal and briskly reactive to light. CN III, IV, VI: extraocular movement are normal. No ptosis. CN V: Facial sensation is intact to pinprick in all 3 divisions bilaterally. Corneal responses are intact.  CN VII: Face is symmetric with normal eye closure and smile. CN VIII: Hearing is normal to casual conversation CN IX, X: Palate elevates symmetrically. Phonation is normal. CN XI: Head turning and shoulder shrug are intact CN XII: Tongue is midline with normal movements and no atrophy.  MOTOR: There is no pronator drift of out-stretched arms. Muscle bulk and tone are normal. Muscle strength is normal.  REFLEXES: Reflexes are 1 and symmetric at the biceps, triceps, knees, and ankles. Plantar responses are flexor.  SENSORY: Intact to light touch, pinprick, positional and vibratory sensation are intact in fingers and toes.  COORDINATION: Rapid alternating movements and fine finger movements are intact. There is no dysmetria on finger-to-nose and heel-knee-shin.    GAIT/STANCE: Push-up, wide-based cautious mildly unsteady  REVIEW OF SYSTEMS: Out of a complete 14 system review of symptoms, the patient complains only of the following symptoms, and all other reviewed systems are negative.  See HPI  ALLERGIES: Allergies  Allergen Reactions   Ciprofloxacin  Anaphylaxis    Avoid due to myasthenia gravis  Other Reaction(s): Not to take with MS   Quinolones Anaphylaxis    Avoid due to myasthenia gravis    HOME MEDICATIONS: Outpatient Medications  Prior to Visit  Medication Sig Dispense Refill   ascorbic acid (VITAMIN C) 250 MG tablet Take 250 mg by mouth 2 (two) times daily.     denosumab  (PROLIA ) 60 MG/ML SOSY injection      hydrochlorothiazide  (HYDRODIURIL ) 25 MG tablet Take 1 tablet (25 mg total) by mouth daily. 90 tablet 1   Multiple Vitamins-Minerals (ICAPS) CAPS Take 1 capsule by mouth 2 (two) times daily.     mycophenolate  (CELLCEPT ) 250 MG capsule Take 1 capsule (250 mg total) by mouth daily. 90 capsule 1   pantoprazole  (PROTONIX ) 40 MG tablet Take 40 mg by mouth daily. May take an additional 40mg s as needed for heartburn     potassium chloride  (KLOR-CON ) 10 MEQ tablet TAKE 1 TABLET BY MOUTH ONCE DAILY 90 tablet 3   Probiotic Product (ALIGN) 4 MG CAPS take 1 po qd     pyridostigmine  (MESTINON ) 60 MG tablet Take 0.5 tablets (30 mg total) by mouth 2 (two) times daily as needed. 150 tablet 3   tamsulosin (FLOMAX) 0.4 MG CAPS capsule 0.4 mg as needed.   3   UNABLE TO FIND Take 1 tablet by mouth daily. Med Name:[facility] - zeaxanthin 10mg  and lutein 10mg      Vitamin D, Ergocalciferol, (DRISDOL) 50000 UNITS  CAPS capsule Take 50,000 Units by mouth every 7 (seven) days. ON FRIDAYS     chlorhexidine  (PERIDEX ) 0.12 % solution SWISH 1 CAPFUL IN MOUTH AND HOLD FOR 2 MINUTES, THEN SPIT OUT. USE TWICE A DAY. (Patient not taking: Reported on 06/14/2024)     Zeaxanthin POWD Take by mouth. (Patient not taking: Reported on 06/14/2024)     No facility-administered medications prior to visit.    PAST MEDICAL HISTORY: Past Medical History:  Diagnosis Date   Adenomatous colon polyp    B12 deficiency    [facility]story   Basal cell carcinoma 08/18/2006   BASOSQUAMOUS LEFT OUTER FOREHEAD TX CX3 , EXC   BPH (benign prostatic hyperplasia)    Ole ulcer    Cancer [facility])    precancerous skin lesions on leg    Cataract 2009   bilateral cataract surgery   Delayed gastric emptying    Disturbance of salivary secretion 06/20/2013   Dysphagia,  pharyngoesophageal phase 03/16/2013   Dysphonia 03/16/2013   Gait abnormality 11/29/2019   GERD (gastroesophageal reflux disease)    Hiatal hernia    hiatial hernia repair  02/08/13   History of blood transfusion    Hyperlipidemia    not on medication   Hypertension    Iron  deficiency anemia    Leukoplakia 2001   Treated for   Macular degeneration    (L) wet, (R) dry   Myasthenia gravis (HCC) 03/21/2013   Peripheral neuropathy    Mild History of   Polyneuritis 1965   Resolved   SCC (squamous cell carcinoma) 03/12/2011   SCC LEFT INNER CHEEK CX3 5FU   SCC (squamous cell carcinoma) 08/24/2012   RIGHT LOWER SHIN TX WITH BX MOHS   SCC (squamous cell carcinoma) 07/26/2014   LEFT UNDER CHEEK CX3 5FU   SCC (squamous cell carcinoma) 03/12/2021   well diff- right temple (CX35FU)   Squamous cell carcinoma of skin 05/03/2008   SCC INSITU RIGHT V OF CHEST TX EXC   Ulcer    Vitamin D deficiency     PAST SURGICAL HISTORY: Past Surgical History:  Procedure Laterality Date   Arthroscopic knee surgery     left   cateract surgeries      bilateral   CHOLECYSTECTOMY     COLONOSCOPY WITH PROPOFOL  N/A 02/16/2017   Procedure: COLONOSCOPY WITH PROPOFOL ;  Surgeon: Vicci Gladis POUR, MD;  Location: [facility];  Service: Endoscopy;  Laterality: N/A;   GASTROSTOMY W/ FEEDING TUBE  [id number]   HERNIA REPAIR     Laparoscopic ventral   HIATAL HERNIA REPAIR  2004   HIATAL HERNIA REPAIR N/A 02/08/2013   Procedure:  REPAIR OFCURRENT HIATAL HERNIA, ;  Surgeon: Krystal JINNY Russell, MD;  Location: [facility];  Service: General;  Laterality: N/A;   LAPAROSCOPIC LYSIS OF ADHESIONS N/A 02/08/2013   Procedure: LAPAROSCOPIC LYSIS OF ADHESIONS;  Surgeon: Krystal JINNY Russell, MD;  Location: [facility];  Service: General;  Laterality: N/A;   LAPAROSCOPIC NISSEN FUNDOPLICATION  02/08/2013   Procedure: LAPAROSCOPIC NISSEN FUNDOPLICATION;  Surgeon: Krystal JINNY Russell, MD;  Location: [facility];  Service: General;;   Multiple oral  surgeries  2002   SKIN CANCER EXCISION  10/2012   right leg-shin area   STOMACH SURGERY  2005   states his stomach had moved up toward his chest , some type of surgery performed   TONSILLECTOMY     UPPER GI ENDOSCOPY N/A 02/08/2013   Procedure: UPPER GI ENDOSCOPY;  Surgeon: Krystal JINNY Russell, MD;  Location: [facility];  Service: General;  Laterality: N/A;    FAMILY HISTORY: Family History  Problem Relation Age of Onset   Diabetes Father    Heart attack Father    Breast cancer Sister    CAD Sister    COPD Sister    Prostate cancer Paternal Grandfather    Colon cancer Son 72   Myasthenia gravis Son    Colon cancer Son 63    SOCIAL HISTORY: Social History   Socioeconomic History   Marital status: Married    Spouse name: Marcos   Number of children: 3   Years of education: 16   Highest education level: Not on file  Occupational History   Occupation: Retired    Associate Professor: RETIRED  Tobacco Use   Smoking status: Former    Current packs/day: 0.00    Types: Cigarettes    Quit date: 09/15/1976    Years since quitting: 47.7   Smokeless tobacco: Never   Tobacco comments:    Quit 1980  Vaping Use   Vaping status: Never Used  Substance and Sexual Activity   Alcohol  use: No    Comment: Moderate alcohol , wine   Drug use: No   Sexual activity: Not on file  Other Topics Concern   Not on file  Social History Narrative   Lives at home, married   Patient is right-handed.   Patient drinks caffeine occasionally.      Social Drivers of Corporate investment banker Strain: Not on file  Food Insecurity: Not on file  Transportation Needs: Not on file  Physical Activity: Not on file  Stress: Not on file  Social Connections: Not on file  Intimate Partner Violence: Not on file   Modena Callander. M.D. Ph.D.

## 2024-06-20 ENCOUNTER — Ambulatory Visit: Admitting: Neurology

## 2024-07-06 DIAGNOSIS — K080 Exfoliation of teeth due to systemic causes: Secondary | ICD-10-CM | POA: Diagnosis not present

## 2024-08-09 DIAGNOSIS — K080 Exfoliation of teeth due to systemic causes: Secondary | ICD-10-CM | POA: Diagnosis not present

## 2024-08-15 ENCOUNTER — Ambulatory Visit: Payer: Self-pay | Admitting: Cardiology

## 2024-08-15 ENCOUNTER — Ambulatory Visit (HOSPITAL_COMMUNITY)
Admission: RE | Admit: 2024-08-15 | Discharge: 2024-08-15 | Disposition: A | Payer: Medicare Other | Source: Ambulatory Visit | Attending: Cardiology

## 2024-08-15 DIAGNOSIS — I350 Nonrheumatic aortic (valve) stenosis: Secondary | ICD-10-CM | POA: Diagnosis not present

## 2024-08-15 LAB — ECHOCARDIOGRAM COMPLETE
AR max vel: 0.89 cm2
AV Area VTI: 0.9 cm2
AV Area mean vel: 0.96 cm2
AV Mean grad: 18.5 mmHg
AV Peak grad: 35.9 mmHg
Ao pk vel: 3 m/s
Area-P 1/2: 2.8 cm2
S' Lateral: 3.63 cm

## 2024-08-17 NOTE — Progress Notes (Signed)
 Appt made yesterday by another nurse.

## 2024-08-17 NOTE — Telephone Encounter (Signed)
 Pts wife calling to ask if they could come in any sooner than Friday at 11:00. They are concerned about the weather on Friday. Please advise.

## 2024-08-18 NOTE — Telephone Encounter (Signed)
 Wife called back as soon as we opened this morning and I have added him back to the schedule since the roads are not bad  Pt wife is calling to reschedule pt 12/5 appt due to bad weather, is there somewhere else we can put him one day next week ? Please advise.

## 2024-08-19 ENCOUNTER — Ambulatory Visit: Admitting: Cardiology

## 2024-08-19 ENCOUNTER — Encounter: Payer: Self-pay | Admitting: Cardiology

## 2024-08-19 ENCOUNTER — Ambulatory Visit: Attending: Cardiology | Admitting: Cardiology

## 2024-08-19 VITALS — BP 158/98 | HR 78 | Ht 69.0 in | Wt 150.4 lb

## 2024-08-19 DIAGNOSIS — I4892 Unspecified atrial flutter: Secondary | ICD-10-CM | POA: Diagnosis not present

## 2024-08-19 DIAGNOSIS — Z79899 Other long term (current) drug therapy: Secondary | ICD-10-CM

## 2024-08-19 DIAGNOSIS — I350 Nonrheumatic aortic (valve) stenosis: Secondary | ICD-10-CM

## 2024-08-19 DIAGNOSIS — I10 Essential (primary) hypertension: Secondary | ICD-10-CM

## 2024-08-19 NOTE — Progress Notes (Signed)
 Cardiology Office Note:    Date:  08/21/2024   ID:  Victor Sutton, DOB 1927/12/27, MRN [id number]  PCP:  [name], MD  Cardiologist:  Lonni LITTIE Nanas, MD  Electrophysiologist:  None   Referring MD: [name], MD   Chief Complaint  Patient presents with   Aortic Stenosis    History of Present Illness:    Victor Sutton is a 89 y.o. male with a hx of myasthenia gravis, atrial flutter following abdominal surgery in 2014, BPH, GERD, hyperlipidemia, hypertension who presents for follow-up.  He was referred by Dr. Loreli for evaluation of murmur, initially seen on 11/26/2020.  He was seen by cardiology in 2014 after noted to have tachycardia post abdominal surgery.  Adenosine  was administered and rhythm was determined to be atrial flutter.  He was not started on anticoagulation.  Echocardiogram 02/09/2013 showed normal biventricular function, moderate LVH, no significant valvular disease.  Labs on 10/23/2020 showed creatinine 0.8, sodium 133, potassium 3.7, albumin 3.7, WBC 4.7, hemoglobin 15.1, LDL 72, TSH 1.7.  He denies any chest pain but reports he has been having shortness of breath.  Occurs with minimal exertion such as cleaning his shower.  He denies any lightheadedness or syncope.  Reports he has had swelling of his legs for years.  Is on hydrochlorothiazide .  Reports he had an episode in February of palpitations where he felt his heart was racing.  Lasted for 3 to 4 minutes.  Smoked 0.5 pdd x 20 years, quit smoking in 1978.  Father had MI at 3.  Sister had CABG in 44s.    Echocardiogram on 12/12/2020 showed normal biventricular function, mild LVH, grade 1 diastolic dysfunction, mild aortic stenosis (mean gradient 14 mmHg, V-max 2.6 m/s, AVA 1.9 cm).  Zio patch x14 days on 01/02/2021 showed frequent PACs (6.4% of beats), occasional PVCs (1.7% of beats), 6 episodes of SVT, longest lasting 14 beats.  Echocardiogram 07/2022 showed EF 60 to 65%, normal RV function, mild  aortic stenosis, mild aortic regurgitation, mild mitral regurgitation.  Echocardiogram 08/19/2023 showed EF 60 to 65%, normal RV function, moderate aortic stenosis.  Echocardiogram 08/15/2024 showed EF 60 to 65%, normal RV function, severe paradoxical low-flow low gradient aortic stenosis (V-max 3.0 m/s, mean gradient 18 mmHg, AVA 0.9 cm, DI 0.22).  Since last clinic visit, he reports he is doing okay.  States that he has been having dyspnea with exertion.  Just walking down the hall will feel short of breath.  He denies any chest pain.  Does report some lightheadedness but denies any syncope.  Does report having some lower extremity edema.   Past Medical History:  Diagnosis Date   Adenomatous colon polyp    B12 deficiency    HIstory   Basal cell carcinoma 08/18/2006   BASOSQUAMOUS LEFT OUTER FOREHEAD TX CX3 , EXC   BPH (benign prostatic hyperplasia)    Ole ulcer    Cancer [facility])    precancerous skin lesions on leg    Cataract 2009   bilateral cataract surgery   Delayed gastric emptying    Disturbance of salivary secretion 06/20/2013   Dysphagia, pharyngoesophageal phase 03/16/2013   Dysphonia 03/16/2013   Gait abnormality 11/29/2019   GERD (gastroesophageal reflux disease)    Hiatal hernia    hiatial hernia repair  02/08/13   History of blood transfusion    Hyperlipidemia    not on medication   Hypertension    Iron  deficiency anemia    Leukoplakia  2001   Treated for   Macular degeneration    (L) wet, (R) dry   Myasthenia gravis (HCC) 03/21/2013   Peripheral neuropathy    Mild History of   Polyneuritis 1965   Resolved   SCC (squamous cell carcinoma) 03/12/2011   SCC LEFT INNER CHEEK CX3 5FU   SCC (squamous cell carcinoma) 08/24/2012   RIGHT LOWER SHIN TX WITH BX MOHS   SCC (squamous cell carcinoma) 07/26/2014   LEFT UNDER CHEEK CX3 5FU   SCC (squamous cell carcinoma) 03/12/2021   well diff- right temple (CX35FU)   Squamous cell carcinoma of skin 05/03/2008   SCC  INSITU RIGHT V OF CHEST TX EXC   Ulcer    Vitamin D deficiency     Past Surgical History:  Procedure Laterality Date   Arthroscopic knee surgery     left   cateract surgeries      bilateral   CHOLECYSTECTOMY     COLONOSCOPY WITH PROPOFOL  N/A 02/16/2017   Procedure: COLONOSCOPY WITH PROPOFOL ;  Surgeon: Vicci Gladis POUR, MD;  Location: [facility];  Service: Endoscopy;  Laterality: N/A;   GASTROSTOMY W/ FEEDING TUBE  [id number]   HERNIA REPAIR     Laparoscopic ventral   HIATAL HERNIA REPAIR  2004   HIATAL HERNIA REPAIR N/A 02/08/2013   Procedure:  REPAIR OFCURRENT HIATAL HERNIA, ;  Surgeon: Krystal JINNY Russell, MD;  Location: [facility];  Service: General;  Laterality: N/A;   LAPAROSCOPIC LYSIS OF ADHESIONS N/A 02/08/2013   Procedure: LAPAROSCOPIC LYSIS OF ADHESIONS;  Surgeon: Krystal JINNY Russell, MD;  Location: [facility];  Service: General;  Laterality: N/A;   LAPAROSCOPIC NISSEN FUNDOPLICATION  02/08/2013   Procedure: LAPAROSCOPIC NISSEN FUNDOPLICATION;  Surgeon: Krystal JINNY Russell, MD;  Location: [facility];  Service: General;;   Multiple oral surgeries  2002   SKIN CANCER EXCISION  10/2012   right leg-shin area   STOMACH SURGERY  2005   states his stomach had moved up toward his chest , some type of surgery performed   TONSILLECTOMY     UPPER GI ENDOSCOPY N/A 02/08/2013   Procedure: UPPER GI ENDOSCOPY;  Surgeon: Krystal JINNY Russell, MD;  Location: [facility];  Service: General;  Laterality: N/A;    Current Medications: Current Meds  Medication Sig   ascorbic acid (VITAMIN C) 250 MG tablet Take 250 mg by mouth 2 (two) times daily.   chlorhexidine  (PERIDEX ) 0.12 % solution SWISH 1 CAPFUL IN MOUTH AND HOLD FOR 2 MINUTES, THEN SPIT OUT. USE TWICE A DAY.   denosumab  (PROLIA ) 60 MG/ML SOSY injection    hydrochlorothiazide  (HYDRODIURIL ) 25 MG tablet Take 1 tablet (25 mg total) by mouth daily.   Multiple Vitamins-Minerals (ICAPS) CAPS Take 1 capsule by mouth 2 (two) times daily.   pantoprazole  (PROTONIX ) 40  MG tablet Take 40 mg by mouth daily. May take an additional 40mg s as needed for heartburn   potassium chloride  (KLOR-CON ) 10 MEQ tablet TAKE 1 TABLET BY MOUTH ONCE DAILY   Probiotic Product (ALIGN) 4 MG CAPS take 1 po qd   pyridostigmine  (MESTINON ) 60 MG tablet Take 0.5 tablets (30 mg total) by mouth 2 (two) times daily as needed.   tamsulosin (FLOMAX) 0.4 MG CAPS capsule 0.4 mg as needed.    UNABLE TO FIND Take 1 tablet by mouth daily. Med Name:EyePromise - zeaxanthin 10mg  and lutein 10mg    Vitamin D, Ergocalciferol, (DRISDOL) 50000 UNITS CAPS capsule Take 50,000 Units by mouth every 7 (seven) days. ON FRIDAYS   Zeaxanthin  POWD Take by mouth.     Allergies:   Ciprofloxacin  and Quinolones   Social History   Socioeconomic History   Marital status: Married    Spouse name: Marcos   Number of children: 3   Years of education: 16   Highest education level: Not on file  Occupational History   Occupation: Retired    Associate Professor: RETIRED  Tobacco Use   Smoking status: Former    Current packs/day: 0.00    Types: Cigarettes    Quit date: 09/15/1976    Years since quitting: 47.9   Smokeless tobacco: Never   Tobacco comments:    Quit 1980  Vaping Use   Vaping status: Never Used  Substance and Sexual Activity   Alcohol  use: No    Comment: Moderate alcohol , wine   Drug use: No   Sexual activity: Not on file  Other Topics Concern   Not on file  Social History Narrative   Lives at home, married   Patient is right-handed.   Patient drinks caffeine occasionally.      Social Drivers of Corporate Investment Banker Strain: Not on file  Food Insecurity: Not on file  Transportation Needs: Not on file  Physical Activity: Not on file  Stress: Not on file  Social Connections: Not on file     Family History: The patient's family history includes Breast cancer in his sister; CAD in his sister; COPD in his sister; Colon cancer (age of onset: 29) in his son and son; Diabetes in his father;  Heart attack in his father; Myasthenia gravis in his son; Prostate cancer in his paternal grandfather.  ROS:   Please see the history of present illness.     All other systems reviewed and are negative.  EKGs/Labs/Other Studies Reviewed:    The following studies were reviewed today:   EKG:   09/03/2023: Normal sinus rhythm, PACs, rate 74 hide of Aleve with Tylenol  clinic visit really when it is like that he was so late this year yeah  Recent Labs: 12/17/2023: ALT 12 08/19/2024: BUN 15; Creatinine, Ser 0.83; Hemoglobin 13.7; Magnesium  2.0; Platelets 223; Potassium 4.0; Sodium 136  Recent Lipid Panel    Component Value Date/Time   CHOL 118 02/14/2013 0530   TRIG 79 02/21/2013 0520    Physical Exam:    VS:  BP (!) 158/98   Pulse 78   Ht 5' 9 (1.753 m)   Wt 150 lb 6.4 oz (68.2 kg)   SpO2 98%   BMI 22.21 kg/m     Wt Readings from Last 3 Encounters:  08/19/24 150 lb 6.4 oz (68.2 kg)  06/14/24 149 lb (67.6 kg)  04/12/24 152 lb (68.9 kg)     GEN:  in no acute distress HEENT: Normal NECK: No JVD CARDIAC: Normal rate, regular, 2 out of 6 systolic murmur loudest at RUSB RESPIRATORY:  Clear to auscultation without rales, wheezing or rhonchi  ABDOMEN: Soft, non-tender, non-distended MUSCULOSKELETAL:  No edema; No deformity  SKIN: Warm and dry NEUROLOGIC:  Alert and oriented x 3 PSYCHIATRIC:  Normal affect   ASSESSMENT:    1. Aortic valve stenosis, etiology of cardiac valve disease unspecified   2. Atrial flutter, unspecified type (HCC)   3. Essential hypertension   4. Medication management      PLAN:    Aortic stenosis: Mild AS on echo 07/2022 (Appears mild to moderate with V-max 2.8 m/s, mean gradient 18 mmHg, AVA 1.2 cm^2).  Echocardiogram 08/2023 showed moderate aortic  stenosis.  Echocardiogram 08/15/2024 showed EF 60 to 65%, normal RV function, severe paradoxical low-flow low gradient aortic stenosis (V-max 3.0 m/s, mean gradient 18 mmHg, AVA 0.9 cm, DI 0.22). - He  is reporting dyspnea on exertion, suspect due to severe aortic stenosis.  Refer to valve clinic for TAVR evaluation  Atrial flutter: Occurred following hernia surgery in 2014, no evidence of recurrence.  CHA2DS2-VASc score 3 (hypertension, age x2) but was not been started on anticoagulation.  Has had no known recurrence of atrial flutter but reported palpitations.  Zio patch x14 days on 01/02/2021 showed frequent PACs (6.4% of beats), occasional PVCs (1.7% of beats), 6 episodes of SVT, longest lasting 14 beats.  Did not report palpitations while wearing the monitor.  Recommend considering Kardia mobile device for more long-term monitoring  Hypertension: On hydrochlorothiazide  25 mg daily.  Check BMET, magnesium   Myasthenia gravis: On mestinon   RTC in 3 months  Medication Adjustments/Labs and Tests Ordered: Current medicines are reviewed at length with the patient today.  Concerns regarding medicines are outlined above.  Orders Placed This Encounter  Procedures   Basic Metabolic Panel (BMET)   CBC   Magnesium    Ambulatory referral to Cardiology   Ambulatory referral to Structural Heart/Valve Clinic (only at [facility])    No orders of the defined types were placed in this encounter.    Patient Instructions  Medication Instructions:  Your physician recommends that you continue on your current medications as directed. Please refer to the Current Medication list given to you today.  *If you need a refill on your cardiac medications before your next appointment, please call your pharmacy*  Lab Work: Today: BMET, CBC & Magnesium   If you have any lab test that is abnormal or we need to change your treatment, we will call you to review the results.  Testing/Procedures: None ordered  Follow-Up: At [facility], you and your health needs are our priority.  As part of our continuing mission to provide you with exceptional heart care, our providers are all part of one team.  This  team includes your primary Cardiologist (physician) and Advanced Practice Providers or APPs (Physician Assistants and Nurse Practitioners) who all work together to provide you with the care you need, when you need it.  You have been referred to our structural team to discuss aortic valve   Your next appointment:   3 month(s)  Provider:   Lonni LITTIE Nanas, MD     Thank you for choosing [facility]!!   [phone]         Signed, Lonni LITTIE Nanas, MD  08/21/2024 2:44 PM    [facility]

## 2024-08-19 NOTE — Patient Instructions (Signed)
 Medication Instructions:  Your physician recommends that you continue on your current medications as directed. Please refer to the Current Medication list given to you today.  *If you need a refill on your cardiac medications before your next appointment, please call your pharmacy*  Lab Work: Today: BMET, CBC & Magnesium   If you have any lab test that is abnormal or we need to change your treatment, we will call you to review the results.  Testing/Procedures: None ordered  Follow-Up: At [facility], you and your health needs are our priority.  As part of our continuing mission to provide you with exceptional heart care, our providers are all part of one team.  This team includes your primary Cardiologist (physician) and Advanced Practice Providers or APPs (Physician Assistants and Nurse Practitioners) who all work together to provide you with the care you need, when you need it.  You have been referred to our structural team to discuss aortic valve   Your next appointment:   3 month(s)  Provider:   Lonni LITTIE Nanas, MD     Thank you for choosing [facility]!!   [phone]

## 2024-08-20 LAB — CBC
Hematocrit: 41 % (ref 37.5–51.0)
Hemoglobin: 13.7 g/dL (ref 13.0–17.7)
MCH: 32.9 pg (ref 26.6–33.0)
MCHC: 33.4 g/dL (ref 31.5–35.7)
MCV: 99 fL — ABNORMAL HIGH (ref 79–97)
Platelets: 223 x10E3/uL (ref 150–450)
RBC: 4.16 x10E6/uL (ref 4.14–5.80)
RDW: 11.8 % (ref 11.6–15.4)
WBC: 5.9 x10E3/uL (ref 3.4–10.8)

## 2024-08-20 LAB — BASIC METABOLIC PANEL WITH GFR
BUN/Creatinine Ratio: 18 (ref 10–24)
BUN: 15 mg/dL (ref 10–36)
CO2: 24 mmol/L (ref 20–29)
Calcium: 9.7 mg/dL (ref 8.6–10.2)
Chloride: 98 mmol/L (ref 96–106)
Creatinine, Ser: 0.83 mg/dL (ref 0.76–1.27)
Glucose: 97 mg/dL (ref 70–99)
Potassium: 4 mmol/L (ref 3.5–5.2)
Sodium: 136 mmol/L (ref 134–144)
eGFR: 81 mL/min/1.73 (ref >59)

## 2024-08-20 LAB — MAGNESIUM: Magnesium: 2 mg/dL (ref 1.6–2.3)

## 2024-08-21 ENCOUNTER — Ambulatory Visit: Payer: Self-pay | Admitting: Cardiology

## 2024-08-24 ENCOUNTER — Other Ambulatory Visit: Payer: Self-pay | Admitting: Neurology

## 2024-08-24 NOTE — Telephone Encounter (Signed)
 Last seen on 06/14/24 per note  No longer takes Mestinon    Follow up scheduled on 01/19/25  Rx denied

## 2024-08-26 ENCOUNTER — Ambulatory Visit: Attending: Cardiovascular Disease | Admitting: Cardiovascular Disease

## 2024-08-26 ENCOUNTER — Encounter: Payer: Self-pay | Admitting: Cardiovascular Disease

## 2024-08-26 VITALS — BP 120/70 | HR 74 | Ht 69.0 in | Wt 146.2 lb

## 2024-08-26 DIAGNOSIS — I350 Nonrheumatic aortic (valve) stenosis: Secondary | ICD-10-CM

## 2024-08-26 DIAGNOSIS — Z01812 Encounter for preprocedural laboratory examination: Secondary | ICD-10-CM

## 2024-08-26 NOTE — Progress Notes (Unsigned)
 Cardiology Office Note:    Date:  08/29/2024   ID:  Victor Sutton, DOB 08/20/1928, MRN [id number]  PCP:  Victor Elsie JONETTA Mickey., MD   [facility] Providers Cardiologist:  Victor LITTIE Nanas, MD     Referring MD: Victor Elsie JONETTA Mickey., MD   Chief Complaint  Patient presents with   Shortness of Breath    History of Present Illness:    Victor Sutton is a 89 y.o. male referred by Dr. Nanas for evaluation of severe aortic stenosis.   The patient is here with his wife today. He reports lifestyle limited shortness of breath with exertion. He is short of breath with low level physical activity such as walking short distances. He also complains of marked fatigue and is napping more than in the past. He has occasional chest discomfort but not consistently associated with any exertion.  He denies chest pain, chest pressure, lightheadedness, or syncope.  He sees a dentist regularly and reports that he just had some implants removed. He has had a heart murmur for many years.   The patient and his wife have 5 children between the 2 of them.  They have 10 grandchildren.  The patient worked in corporate investment banker for [employer].  He has been retired for a long time.  Past Medical History:  Diagnosis Date   Adenomatous colon polyp    B12 deficiency    HIstory   Basal cell carcinoma 08/18/2006   BASOSQUAMOUS LEFT OUTER FOREHEAD TX CX3 , EXC   BPH (benign prostatic hyperplasia)    Ole ulcer    Cancer [facility])    precancerous skin lesions on leg    Cataract 2009   bilateral cataract surgery   Delayed gastric emptying    Disturbance of salivary secretion 06/20/2013   Dysphagia, pharyngoesophageal phase 03/16/2013   Dysphonia 03/16/2013   Gait abnormality 11/29/2019   GERD (gastroesophageal reflux disease)    Hiatal hernia    hiatial hernia repair  02/08/13   History of blood transfusion    Hyperlipidemia    not on medication   Hypertension    Iron   deficiency anemia    Leukoplakia 2001   Treated for   Macular degeneration    (L) wet, (R) dry   Myasthenia gravis (HCC) 03/21/2013   Peripheral neuropathy    Mild History of   Polyneuritis 1965   Resolved   SCC (squamous cell carcinoma) 03/12/2011   SCC LEFT INNER CHEEK CX3 5FU   SCC (squamous cell carcinoma) 08/24/2012   RIGHT LOWER SHIN TX WITH BX MOHS   SCC (squamous cell carcinoma) 07/26/2014   LEFT UNDER CHEEK CX3 5FU   SCC (squamous cell carcinoma) 03/12/2021   well diff- right temple (CX35FU)   Squamous cell carcinoma of skin 05/03/2008   SCC INSITU RIGHT V OF CHEST TX EXC   Ulcer    Vitamin D deficiency    Past Surgical History:  Procedure Laterality Date   Arthroscopic knee surgery     left   cateract surgeries      bilateral   CHOLECYSTECTOMY     COLONOSCOPY WITH PROPOFOL  N/A 02/16/2017   Procedure: COLONOSCOPY WITH PROPOFOL ;  Surgeon: Sutton Gladis POUR, MD;  Location: [facility];  Service: Endoscopy;  Laterality: N/A;   GASTROSTOMY W/ FEEDING TUBE  [id number]   HERNIA REPAIR     Laparoscopic ventral   HIATAL HERNIA REPAIR  2004   HIATAL HERNIA REPAIR N/A 02/08/2013   Procedure:  REPAIR  OFCURRENT HIATAL HERNIA, ;  Surgeon: Krystal JINNY Russell, MD;  Location: [facility];  Service: General;  Laterality: N/A;   LAPAROSCOPIC LYSIS OF ADHESIONS N/A 02/08/2013   Procedure: LAPAROSCOPIC LYSIS OF ADHESIONS;  Surgeon: Krystal JINNY Russell, MD;  Location: [facility];  Service: General;  Laterality: N/A;   LAPAROSCOPIC NISSEN FUNDOPLICATION  02/08/2013   Procedure: LAPAROSCOPIC NISSEN FUNDOPLICATION;  Surgeon: Krystal JINNY Russell, MD;  Location: [facility];  Service: General;;   Multiple oral surgeries  2002   SKIN CANCER EXCISION  10/2012   right leg-shin area   STOMACH SURGERY  2005   states his stomach had moved up toward his chest , some type of surgery performed   TONSILLECTOMY     UPPER GI ENDOSCOPY N/A 02/08/2013   Procedure: UPPER GI ENDOSCOPY;  Surgeon: Krystal JINNY Russell, MD;   Location: [facility];  Service: General;  Laterality: N/A;    Current Medications: Active Medications[1]   Allergies:   Ciprofloxacin  and Quinolones   ROS:   Please see the history of present illness.    All other systems reviewed and are negative.  EKGs/Labs/Other Studies Reviewed:    The following studies were reviewed today: Cardiac Studies & Procedures   ______________________________________________________________________________________________     ECHOCARDIOGRAM  ECHOCARDIOGRAM COMPLETE 08/15/2024  Narrative ECHOCARDIOGRAM REPORT    Patient Name:   Victor Sutton Date of Exam: 08/15/2024 Medical Rec #:  [id number]        Height:       69.0 in Accession #:    [id number]       Weight:       149.0 lb Date of Birth:  08/07/1928        BSA:          1.823 m Patient Age:    89 years         BP:           110/69 mmHg Patient Gender: M                HR:           67 bpm. Exam Location:  [address]  Procedure: 2D Echo, Cardiac Doppler and Color Doppler (Both Spectral and Color Flow Doppler were utilized during procedure).  Indications:    Aortic valve stenosis, etiology of cardiac valve disease unspecified [I35.0 (ICD-10-CM)]  History:        Patient has prior history of Echocardiogram examinations, most recent 08/19/2023. Arrythmias:Atrial Fibrillation; Risk Factors:None.  Sonographer:    Rosaline Fujisawa MHA, RDMS, RVT, RDCS Referring Phys: [id number] Victor LITTIE [facility]   Sonographer Comments: Suboptimal parasternal window. Image acquisition challenging due to patient body habitus. IMPRESSIONS   1. Left ventricular ejection fraction, by estimation, is 60 to 65%. The left ventricle has normal function. Left ventricular diastolic parameters were normal. 2. Right ventricular systolic function is normal. The right ventricular size is normal. 3. The mitral valve is grossly normal. No evidence of mitral valve regurgitation. 4. There is evidence of severe  paradoxical low flow low gradient AS with PV 3.0 m/sec, MG 18 mmHg, AVA 0.9 cm^2, DVI 0.22 and SVI 30. No evidence of aortic regurgitation but limited evaluation due to quality of study.. The aortic valve was not well visualized. Aortic valve regurgitation is not visualized. Severe aortic valve stenosis. 5. The inferior vena cava is normal in size with <50% respiratory variability, suggesting right atrial pressure of 8 mmHg.  Comparison(s): No changes compared to prior echo with no change in  EF, gradient, AVA, DVI, or SVI.  FINDINGS Left Ventricle: Left ventricular ejection fraction, by estimation, is 60 to 65%. The left ventricle has normal function. The left ventricular internal cavity size was normal in size. There is no left ventricular hypertrophy. Left ventricular diastolic parameters were normal.  Right Ventricle: The right ventricular size is normal. No increase in right ventricular wall thickness. Right ventricular systolic function is normal.  Left Atrium: Left atrial size was normal in size.  Right Atrium: Right atrial size was normal in size.  Pericardium: The pericardium was not well visualized.  Mitral Valve: The mitral valve is grossly normal. No evidence of mitral valve regurgitation.  Tricuspid Valve: The tricuspid valve is grossly normal. Tricuspid valve regurgitation is not demonstrated.  Aortic Valve: There is evidence of severe paradoxical low flow low gradient AS with PV 3.0 m/sec, MG 18 mmHg, AVA 0.9 cm^2, DVI 0.22 and SVI 30. No evidence of aortic regurgitation but limited evaluation due to quality of study. The aortic valve was not well visualized. Aortic valve regurgitation is not visualized. Severe aortic stenosis is present. Aortic valve mean gradient measures 18.5 mmHg. Aortic valve peak gradient measures 35.9 mmHg. Aortic valve area, by VTI measures 0.90 cm.  Aorta: The aortic root is normal in size and structure.  Venous: The inferior vena cava is normal in  size with less than 50% respiratory variability, suggesting right atrial pressure of 8 mmHg.  IAS/Shunts: The interatrial septum was not well visualized.   LEFT VENTRICLE PLAX 2D LVIDd:         4.28 cm   Diastology LVIDs:         3.63 cm   LV e' medial:    6.31 cm/s LV PW:         1.08 cm   LV E/e' medial:  10.2 LV IVS:        0.94 cm   LV e' lateral:   8.70 cm/s LVOT diam:     2.12 cm   LV E/e' lateral: 7.4 LV SV:         54 LV SV Index:   30 LVOT Area:     3.53 cm   RIGHT VENTRICLE          IVC RV Basal diam:  3.34 cm  IVC diam: 1.00 cm RV Mid diam:    2.94 cm TAPSE (M-mode): 2.0 cm  LEFT ATRIUM           Index        RIGHT ATRIUM          Index LA diam:      4.72 cm 2.59 cm/m   RA Area:     8.75 cm LA Vol (A2C): 32.3 ml 17.72 ml/m  RA Volume:   15.40 ml 8.45 ml/m LA Vol (A4C): 46.1 ml 25.29 ml/m AORTIC VALVE AV Area (Vmax):    0.89 cm AV Area (Vmean):   0.96 cm AV Area (VTI):     0.90 cm AV Vmax:           299.50 cm/s AV Vmean:          184.800 cm/s AV VTI:            0.607 m AV Peak Grad:      35.9 mmHg AV Mean Grad:      18.5 mmHg LVOT Vmax:         75.90 cm/s LVOT Vmean:        50.100 cm/s LVOT VTI:  0.154 m LVOT/AV VTI ratio: 0.25  AORTA Ao Root diam: 3.33 cm  MITRAL VALVE MV Area (PHT): 2.80 cm    SHUNTS MV Decel Time: 271 msec    Systemic VTI:  0.15 m MV E velocity: 64.50 cm/s  Systemic Diam: 2.12 cm MV A velocity: 87.00 cm/s MV E/A ratio:  0.74  Franck Azobou Tonleu Electronically signed by Joelle Cedars Tonleu Signature Date/Time: 08/15/2024/4:07:02 PM    Final    MONITORS  LONG TERM MONITOR (3-14 DAYS) 01/02/2021  Narrative  Frequent PACs (6.4%)  Occasional PVCs (1.7%).  6 episodes of SVT, longest lasting 14 beats   Patch Wear Time:  13 days and 23 hours (2022-03-31T11:36:41-0400 to 2022-04-14T11:19:58-0400)  Patient had a min HR of 44 bpm, max HR of 160 bpm, and avg HR of 75 bpm. Predominant underlying rhythm was  Sinus Rhythm. 6 Supraventricular Tachycardia runs occurred, the run with the fastest interval lasting 10 beats with a max rate of 160 bpm, the longest lasting 14 beats with an avg rate of 122 bpm. Isolated SVEs were frequent (6.4%, [id number]), SVE Couplets were rare (<1.0%, 432), and SVE Triplets were rare (<1.0%, 15). Isolated VEs were occasional (1.7%, [id number]), VE Couplets were rare (<1.0%, 396), and VE Triplets were rare (<1.0%, 17). Ventricular Bigeminy and Trigeminy were present. Difficulty discerning atrial activity making definitive diagnosis difficult to ascertain.       ______________________________________________________________________________________________      EKG:        Recent Labs: 12/17/2023: ALT 12 08/19/2024: BUN 15; Creatinine, Ser 0.83; Hemoglobin 13.7; Magnesium  2.0; Platelets 223; Potassium 4.0; Sodium 136  Recent Lipid Panel    Component Value Date/Time   CHOL 118 02/14/2013 0530   TRIG 79 02/21/2013 0520        Physical Exam:    VS:  BP 120/70 (BP Location: Right Arm, Patient Position: Sitting, Cuff Size: Normal) Comment: Per patient's request BP Left arm 140/80  Pulse 74   Ht 5' 9 (1.753 m)   Wt 146 lb 3.2 oz (66.3 kg)   SpO2 99%   BMI 21.59 kg/m     Wt Readings from Last 3 Encounters:  08/26/24 146 lb 3.2 oz (66.3 kg)  08/19/24 150 lb 6.4 oz (68.2 kg)  06/14/24 149 lb (67.6 kg)     GEN: Thin, elderly appearing male in no acute distress HEENT: Normal NECK: No JVD; bilateral carotid bruits LYMPHATICS: No lymphadenopathy CARDIAC: RRR, 2/6 crescendo decrescendo murmur, mid-to-late peaking, A2 present RESPIRATORY:  Clear to auscultation without rales, wheezing or rhonchi  ABDOMEN: Soft, non-tender, non-distended MUSCULOSKELETAL: Trace bilateral ankle edema; No deformity  SKIN: Warm and dry NEUROLOGIC:  Alert and oriented x 3 PSYCHIATRIC:  Normal affect   Assessment & Plan Encounter for preprocedural laboratory examination  Nonrheumatic aortic  valve stenosis The patient appears to have severe paradoxical low-flow low gradient aortic stenosis associated with NYHA functional class III limitation from shortness of breath and fatigue.  He reports progressive symptoms over the past 3 to 6 months.  His echocardiogram is personally reviewed.  He has poor acoustic windows.  LVEF is reportedly normal at 60 to 65%.  The aortic valve is not well-visualized but at least moderately calcified and it appears severely restricted.  The peak systolic velocity is 3.1 m/s, peak and mean gradients 38 and 20 mmHg respectively, dimensionless index 0.25, and calculated valve area by VTI 0.77 cm. I have reviewed the natural history of aortic stenosis with the patient and their family members who are  present today. We have discussed the limitations of medical therapy and the poor prognosis associated with symptomatic aortic stenosis. We have reviewed potential treatment options, including palliative medical therapy, conventional surgical aortic valve replacement, and transcatheter aortic valve replacement. We discussed treatment options in the context of the patient's specific comorbid medical conditions.  The patient strongly desires to move forward with TAVR evaluation.  He understands that some of his symptoms may very well be age-related.  I have recommended a right and left heart catheterization for evaluation of coronary artery disease and hemodynamics. I have reviewed the risks, indications, and alternatives to cardiac catheterization, possible angioplasty, and stenting with the patient. Risks include but are not limited to bleeding, infection, vascular injury, stroke, myocardial infection, arrhythmia, kidney injury, radiation-related injury in the case of prolonged fluoroscopy use, emergency cardiac surgery, and death. The patient understands the risks of serious complication is 1-2 in 1000 with diagnostic cardiac cath and 1-2% or less with angioplasty/stenting.  Once  his cardiac catheterization is completed, he will be referred for a gated cardiac CTA and a CTA of the chest, abdomen, and pelvis to evaluate for the technical feasibility of transfemoral TAVR.  His case will then be reviewed by our multidisciplinary heart valve team and he will be referred for formal cardiac surgical consultation as part of a multidisciplinary approach to his care.       Informed Consent   Shared Decision Making/Informed Consent The risks [stroke (1 in 1000), death (1 in 1000), kidney failure [usually temporary] (1 in 500), bleeding (1 in 200), allergic reaction [possibly serious] (1 in 200)], benefits (diagnostic support and management of coronary artery disease) and alternatives of a cardiac catheterization were discussed in detail with Victor Sutton and he is willing to proceed.       Medication Adjustments/Labs and Tests Ordered: Current medicines are reviewed at length with the patient today.  Concerns regarding medicines are outlined above.  No orders of the defined types were placed in this encounter.  No orders of the defined types were placed in this encounter.   Patient Instructions  Medication Instructions:  Your physician recommends that you continue on your current medications as directed. Please refer to the Current Medication list given to you today.  *If you need a refill on your cardiac medications before your next appointment, please call your pharmacy*  Lab Work: NONE If you have labs (blood work) drawn today and your tests are completely normal, you will receive your results only by: MyChart Message (if you have MyChart) OR A paper copy in the mail If you have any lab test that is abnormal or we need to change your treatment, we will call you to review the results.  Testing/Procedures: Right and Left Heart Cath  Follow-Up: At [facility], you and your health needs are our priority.  As part of our continuing mission to provide you with  exceptional heart care, our providers are all part of one team.  This team includes your primary Cardiologist (physician) and Advanced Practice Providers or APPs (Physician Assistants and Nurse Practitioners) who all work together to provide you with the care you need, when you need it.  Your next appointment:   Structural team will follow up with you regarding CTs  We recommend signing up for the patient portal called MyChart.  Sign up information is provided on this After Visit Summary.  MyChart is used to connect with patients for Virtual Visits (Telemedicine).  Patients are able to view  lab/test results, encounter notes, upcoming appointments, etc.  Non-urgent messages can be sent to your provider as well.   To learn more about what you can do with MyChart, go to [email or web address].   Other Instructions  [facility] A DEPT OF [place] AT [address] A DEPT OF THE [place]. [facility] [address] [place] [address] Dept: [phone] Loc: [phone]  MICHAELJOSEPH REVOLORIO  08/26/2024  You are scheduled for a Cardiac Catheterization on Tuesday, December 23 with Dr. Newman Lawrence.  1. Please arrive at the [facility] (Main Entrance A) at [facility]: [address] [place], [place] [address] at 8:30 AM (This time is 2 hour(s) before your procedure to ensure your preparation).   Free valet parking service is available. You will check in at ADMITTING. The support person will be asked to wait in the waiting room.  It is OK to have someone drop you off and come back when you are ready to be discharged.    Special note: Every effort is made to have your procedure done on time. Please understand that emergencies sometimes delay scheduled procedures.  2. Diet: Nothing to eat after midnight.   3. Hydration: You need to be well hydrated before your procedure. On December 23, you may drink approved liquids (see below) until 2 hours  before the procedure, with 16 oz of water as your last intake.   List of approved liquids water, clear juice, clear tea, black coffee, fruit juices, non-citric and without pulp, carbonated beverages, Gatorade, Kool -Aid, plain Jello-O and plain ice popsicles.  4. Labs: NONE  5. Medication instructions in preparation for your procedure: HOLD hydrochlorothiazide  the day of the procedure    Contrast Allergy: No   On the morning of your procedure, take your Aspirin 81 mg and any morning medicines NOT listed above.  You may use sips of water.  6. Plan to go home the same day, you will only stay overnight if medically necessary. 7. Bring a current list of your medications and current insurance cards. 8. You MUST have a responsible person to drive you home. 9. Someone MUST be with you the first 24 hours after you arrive home or your discharge will be delayed. 10. Please wear clothes that are easy to get on and off and wear slip-on shoes.  Thank you for allowing us  to care for you!   -- [facility]           Signed, Ozell Fell, MD  08/29/2024 2:52 PM    [facility].5     [1]  Current Meds  Medication Sig   ascorbic acid (VITAMIN C) 250 MG tablet Take 250 mg by mouth 2 (two) times daily.   chlorhexidine  (PERIDEX ) 0.12 % solution SWISH 1 CAPFUL IN MOUTH AND HOLD FOR 2 MINUTES, THEN SPIT OUT. USE TWICE A DAY.   denosumab  (PROLIA ) 60 MG/ML SOSY injection    hydrochlorothiazide  (HYDRODIURIL ) 25 MG tablet Take 1 tablet (25 mg total) by mouth daily.   Multiple Vitamins-Minerals (ICAPS) CAPS Take 1 capsule by mouth 2 (two) times daily.   pantoprazole  (PROTONIX ) 40 MG tablet Take 40 mg by mouth daily. May take an additional 40mg s as needed for heartburn   potassium chloride  (KLOR-CON ) 10 MEQ tablet TAKE 1 TABLET BY MOUTH ONCE DAILY   Probiotic Product (ALIGN) 4 MG CAPS take 1 po qd   pyridostigmine  (MESTINON ) 60 MG tablet Take  0.5 tablets (30  mg total) by mouth 2 (two) times daily as needed.   tamsulosin (FLOMAX) 0.4 MG CAPS capsule 0.4 mg as needed.    UNABLE TO FIND Take 1 tablet by mouth daily. Med Name:EyePromise - zeaxanthin 10mg  and lutein 10mg    Vitamin D, Ergocalciferol, (DRISDOL) 50000 UNITS CAPS capsule Take 50,000 Units by mouth every 7 (seven) days. ON FRIDAYS   Zeaxanthin POWD Take by mouth.

## 2024-08-26 NOTE — Progress Notes (Signed)
 Pre Surgical Assessment: 5 M Walk Test  32M=16.63ft  5 Meter Walk Test- trial 1: 9.13 seconds 5 Meter Walk Test- trial 2: 10.34 seconds 5 Meter Walk Test- trial 3: 10.81 seconds 5 Meter Walk Test Average: 10.09 seconds

## 2024-08-26 NOTE — Patient Instructions (Addendum)
 Medication Instructions:  Your physician recommends that you continue on your current medications as directed. Please refer to the Current Medication list given to you today.  *If you need a refill on your cardiac medications before your next appointment, please call your pharmacy*  Lab Work: NONE If you have labs (blood work) drawn today and your tests are completely normal, you will receive your results only by: MyChart Message (if you have MyChart) OR A paper copy in the mail If you have any lab test that is abnormal or we need to change your treatment, we will call you to review the results.  Testing/Procedures: Right and Left Heart Cath  Follow-Up: At [facility], you and your health needs are our priority.  As part of our continuing mission to provide you with exceptional heart care, our providers are all part of one team.  This team includes your primary Cardiologist (physician) and Advanced Practice Providers or APPs (Physician Assistants and Nurse Practitioners) who all work together to provide you with the care you need, when you need it.  Your next appointment:   Structural team will follow up with you regarding CTs  We recommend signing up for the patient portal called MyChart.  Sign up information is provided on this After Visit Summary.  MyChart is used to connect with patients for Virtual Visits (Telemedicine).  Patients are able to view lab/test results, encounter notes, upcoming appointments, etc.  Non-urgent messages can be sent to your provider as well.   To learn more about what you can do with MyChart, go to [email or web address].   Other Instructions  [facility] A DEPT OF [place] HEARTCARE AT [address] A DEPT OF THE [place]. [facility] [address] [place] [address] Dept: [phone] Loc: [phone]  BALIN VANDEGRIFT  08/26/2024  You are scheduled for a Cardiac Catheterization on Tuesday, December 23  with Dr. Newman Lawrence.  1. Please arrive at the [facility] (Main Entrance A) at [facility]: [address] [place], [place] [address] at 8:30 AM (This time is 2 hour(s) before your procedure to ensure your preparation).   Free valet parking service is available. You will check in at ADMITTING. The support person will be asked to wait in the waiting room.  It is OK to have someone drop you off and come back when you are ready to be discharged.    Special note: Every effort is made to have your procedure done on time. Please understand that emergencies sometimes delay scheduled procedures.  2. Diet: Nothing to eat after midnight.   3. Hydration: You need to be well hydrated before your procedure. On December 23, you may drink approved liquids (see below) until 2 hours before the procedure, with 16 oz of water as your last intake.   List of approved liquids water, clear juice, clear tea, black coffee, fruit juices, non-citric and without pulp, carbonated beverages, Gatorade, Kool -Aid, plain Jello-O and plain ice popsicles.  4. Labs: NONE  5. Medication instructions in preparation for your procedure: HOLD hydrochlorothiazide  the day of the procedure    Contrast Allergy: No   On the morning of your procedure, take your Aspirin 81 mg and any morning medicines NOT listed above.  You may use sips of water.  6. Plan to go home the same day, you will only stay overnight if medically necessary. 7. Bring a current list of your medications and current insurance cards.  8. You MUST have a responsible person to drive you home. 9. Someone MUST be with you the first 24 hours after you arrive home or your discharge will be delayed. 10. Please wear clothes that are easy to get on and off and wear slip-on shoes.  Thank you for allowing us  to care for you!   -- [place] [facility]

## 2024-08-27 ENCOUNTER — Encounter: Payer: Self-pay | Admitting: Neurology

## 2024-08-29 MED ORDER — PYRIDOSTIGMINE BROMIDE 60 MG PO TABS: 30.0000 mg | ORAL_TABLET | Freq: Two times a day (BID) | ORAL | 3 refills | Status: AC | PRN

## 2024-09-01 DIAGNOSIS — R351 Nocturia: Secondary | ICD-10-CM | POA: Diagnosis not present

## 2024-09-06 ENCOUNTER — Ambulatory Visit (HOSPITAL_COMMUNITY)
Admission: RE | Admit: 2024-09-06 | Discharge: 2024-09-06 | Disposition: A | Discharge status: Home / Self Care | Attending: Cardiology | Admitting: Cardiology

## 2024-09-06 ENCOUNTER — Other Ambulatory Visit: Payer: Self-pay

## 2024-09-06 ENCOUNTER — Encounter (HOSPITAL_COMMUNITY): Admission: RE | Payer: Self-pay

## 2024-09-06 ENCOUNTER — Other Ambulatory Visit: Payer: Self-pay | Admitting: Physician Assistant

## 2024-09-06 DIAGNOSIS — Z79899 Other long term (current) drug therapy: Secondary | ICD-10-CM | POA: Diagnosis not present

## 2024-09-06 DIAGNOSIS — I2510 Atherosclerotic heart disease of native coronary artery without angina pectoris: Secondary | ICD-10-CM | POA: Diagnosis not present

## 2024-09-06 DIAGNOSIS — I350 Nonrheumatic aortic (valve) stenosis: Secondary | ICD-10-CM | POA: Insufficient documentation

## 2024-09-06 DIAGNOSIS — I4891 Unspecified atrial fibrillation: Secondary | ICD-10-CM

## 2024-09-06 DIAGNOSIS — E785 Hyperlipidemia, unspecified: Secondary | ICD-10-CM | POA: Insufficient documentation

## 2024-09-06 DIAGNOSIS — I10 Essential (primary) hypertension: Secondary | ICD-10-CM | POA: Insufficient documentation

## 2024-09-06 DIAGNOSIS — G7000 Myasthenia gravis without (acute) exacerbation: Secondary | ICD-10-CM | POA: Insufficient documentation

## 2024-09-06 HISTORY — PX: RIGHT/LEFT HEART CATH AND CORONARY ANGIOGRAPHY: CATH118266

## 2024-09-06 LAB — POCT I-STAT EG7
Acid-Base Excess: 0 mmol/L (ref 0.0–2.0)
Acid-Base Excess: 0 mmol/L (ref 0.0–2.0)
Acid-Base Excess: 1 mmol/L (ref 0.0–2.0)
Bicarbonate: 24.4 mmol/L (ref 20.0–28.0)
Bicarbonate: 25.3 mmol/L (ref 20.0–28.0)
Bicarbonate: 25.4 mmol/L (ref 20.0–28.0)
Calcium, Ion: 1.16 mmol/L (ref 1.15–1.40)
Calcium, Ion: 1.17 mmol/L (ref 1.15–1.40)
Calcium, Ion: 1.19 mmol/L (ref 1.15–1.40)
HCT: 36 % — ABNORMAL LOW (ref 39.0–52.0)
HCT: 37 % — ABNORMAL LOW (ref 39.0–52.0)
HCT: 38 % — ABNORMAL LOW (ref 39.0–52.0)
Hemoglobin: 12.2 g/dL — ABNORMAL LOW (ref 13.0–17.0)
Hemoglobin: 12.6 g/dL — ABNORMAL LOW (ref 13.0–17.0)
Hemoglobin: 12.9 g/dL — ABNORMAL LOW (ref 13.0–17.0)
O2 Saturation: 75 %
O2 Saturation: 77 %
O2 Saturation: 77 %
Potassium: 3.6 mmol/L (ref 3.5–5.1)
Potassium: 3.6 mmol/L (ref 3.5–5.1)
Potassium: 3.6 mmol/L (ref 3.5–5.1)
Sodium: 131 mmol/L — ABNORMAL LOW (ref 135–145)
Sodium: 131 mmol/L — ABNORMAL LOW (ref 135–145)
Sodium: 131 mmol/L — ABNORMAL LOW (ref 135–145)
TCO2: 26 mmol/L (ref 22–32)
TCO2: 27 mmol/L (ref 22–32)
TCO2: 27 mmol/L (ref 22–32)
pCO2, Ven: 39.7 mmHg — ABNORMAL LOW (ref 44–60)
pCO2, Ven: 40.5 mmHg — ABNORMAL LOW (ref 44–60)
pCO2, Ven: 40.8 mmHg — ABNORMAL LOW (ref 44–60)
pH, Ven: 7.397 (ref 7.25–7.43)
pH, Ven: 7.4 (ref 7.25–7.43)
pH, Ven: 7.406 (ref 7.25–7.43)
pO2, Ven: 40 mmHg (ref 32–45)
pO2, Ven: 41 mmHg (ref 32–45)
pO2, Ven: 42 mmHg (ref 32–45)

## 2024-09-06 LAB — POCT I-STAT 7, (LYTES, BLD GAS, ICA,H+H)
Acid-Base Excess: 0 mmol/L (ref 0.0–2.0)
Bicarbonate: 24.3 mmol/L (ref 20.0–28.0)
Calcium, Ion: 1.09 mmol/L — ABNORMAL LOW (ref 1.15–1.40)
HCT: 36 % — ABNORMAL LOW (ref 39.0–52.0)
Hemoglobin: 12.2 g/dL — ABNORMAL LOW (ref 13.0–17.0)
O2 Saturation: 94 %
Potassium: 3.4 mmol/L — ABNORMAL LOW (ref 3.5–5.1)
Sodium: 133 mmol/L — ABNORMAL LOW (ref 135–145)
TCO2: 25 mmol/L (ref 22–32)
pCO2 arterial: 36.6 mmHg (ref 32–48)
pH, Arterial: 7.429 (ref 7.35–7.45)
pO2, Arterial: 69 mmHg — ABNORMAL LOW (ref 83–108)

## 2024-09-06 SURGERY — RIGHT/LEFT HEART CATH AND CORONARY ANGIOGRAPHY
Anesthesia: LOCAL

## 2024-09-06 MED ORDER — SODIUM CHLORIDE 0.9 % IV SOLN: 250.0000 mL | INTRAVENOUS | Status: DC | PRN

## 2024-09-06 MED ORDER — FREE WATER: 500.0000 mL | Freq: Once | Status: DC

## 2024-09-06 MED ORDER — HEPARIN SODIUM (PORCINE) 1000 UNIT/ML IJ SOLN
INTRAMUSCULAR | Status: AC
  Filled 2024-09-06: qty 10

## 2024-09-06 MED ORDER — ONDANSETRON HCL 4 MG/2ML IJ SOLN: 4.0000 mg | Freq: Four times a day (QID) | INTRAMUSCULAR | Status: DC | PRN

## 2024-09-06 MED ORDER — SODIUM CHLORIDE 0.9% FLUSH: 3.0000 mL | Freq: Two times a day (BID) | INTRAVENOUS | Status: DC

## 2024-09-06 MED ORDER — ACETAMINOPHEN 325 MG PO TABS: 650.0000 mg | ORAL_TABLET | ORAL | Status: DC | PRN

## 2024-09-06 MED ORDER — SODIUM CHLORIDE 0.9% FLUSH: 3.0000 mL | INTRAVENOUS | Status: DC | PRN

## 2024-09-06 MED ORDER — IOHEXOL 350 MG/ML SOLN
INTRAVENOUS | Status: DC | PRN
  Administered 2024-09-06: 55 mL

## 2024-09-06 MED ORDER — HEPARIN (PORCINE) IN NACL 1000-0.9 UT/500ML-% IV SOLN
INTRAVENOUS | Status: DC | PRN
  Administered 2024-09-06 (×2): 500 mL

## 2024-09-06 MED ORDER — VERAPAMIL HCL 2.5 MG/ML IV SOLN
INTRAVENOUS | Status: AC
  Filled 2024-09-06: qty 2

## 2024-09-06 MED ORDER — HYDRALAZINE HCL 20 MG/ML IJ SOLN: 10.0000 mg | INTRAMUSCULAR | Status: DC | PRN

## 2024-09-06 MED ORDER — VERAPAMIL HCL 2.5 MG/ML IV SOLN
INTRAVENOUS | Status: DC | PRN
  Administered 2024-09-06: 10 mL via INTRA_ARTERIAL

## 2024-09-06 MED ORDER — HEPARIN SODIUM (PORCINE) 1000 UNIT/ML IJ SOLN
INTRAMUSCULAR | Status: DC | PRN
  Administered 2024-09-06: 4000 [IU] via INTRAVENOUS

## 2024-09-06 MED ORDER — ASPIRIN 81 MG PO CHEW: 81.0000 mg | CHEWABLE_TABLET | ORAL | Status: DC

## 2024-09-06 MED ORDER — LIDOCAINE HCL (PF) 1 % IJ SOLN
INTRAMUSCULAR | Status: AC
  Filled 2024-09-06: qty 30

## 2024-09-06 MED ORDER — LIDOCAINE HCL (PF) 1 % IJ SOLN
INTRAMUSCULAR | Status: DC | PRN
  Administered 2024-09-06: 5 mL via INTRADERMAL

## 2024-09-06 SURGICAL SUPPLY — 18 items
CATH BALLN WEDGE 5F 110CM (CATHETERS) IMPLANT
CATH EXPO 5F MPA-1 (CATHETERS) IMPLANT
CATH INFINITI 5 FR 3DRC (CATHETERS) IMPLANT
CATH INFINITI AMBI 5FR TG (CATHETERS) IMPLANT
CATH INFINITI JR4 5F (CATHETERS) IMPLANT
CATH LANGSTON DUAL LUM PIG 6FR (CATHETERS) IMPLANT
DEVICE RAD TR BAND REGULAR (VASCULAR PRODUCTS) IMPLANT
GLIDESHEATH SLEND A-KIT 6F 22G (SHEATH) IMPLANT
GUIDEWIRE .025 260CM (WIRE) IMPLANT
GUIDEWIRE INQWIRE 1.5J.035X260 (WIRE) IMPLANT
PACK CARDIAC CATHETERIZATION (CUSTOM PROCEDURE TRAY) ×1 IMPLANT
SET ATX-X65L (MISCELLANEOUS) IMPLANT
SHEATH 6FR 85 DEST SLENDER (SHEATH) IMPLANT
SHEATH GLIDE SLENDER 4/5FR (SHEATH) IMPLANT
SHEATH PROBE COVER 6X72 (BAG) IMPLANT
TRANSDUCER W/STOPCOCK (MISCELLANEOUS) IMPLANT
TUBING ART PRESS 72 MALE/FEM (TUBING) IMPLANT
WIRE EMERALD 3MM-J .025X260CM (WIRE) IMPLANT

## 2024-09-06 NOTE — H&P (Signed)
 OV 08/26/2024 copied for documentation   Cardiology Office Note:    Date:  09/06/2024   ID:  Victor Sutton, DOB Jul 18, 1928, MRN [id number]  PCP:  Victor Elsie JONETTA Mickey., MD   [facility] Providers Cardiologist:  Victor LITTIE Nanas, MD       C/C: Severe aortic stenosis   History of Present Illness:    Victor Sutton is a 89 y.o. male referred by Dr. Nanas for evaluation of severe aortic stenosis.   The patient is here with his wife today. He reports lifestyle limited shortness of breath with exertion. He is short of breath with low level physical activity such as walking short distances. He also complains of marked fatigue and is napping more than in the past. He has occasional chest discomfort but not consistently associated with any exertion.  He denies chest pain, chest pressure, lightheadedness, or syncope.  He sees a dentist regularly and reports that he just had some implants removed. He has had a heart murmur for many years.   The patient and his wife have 5 children between the 2 of them.  They have 10 grandchildren.  The patient worked in corporate investment banker for [employer].  He has been retired for a long time.  Past Medical History:  Diagnosis Date   Adenomatous colon polyp    B12 deficiency    HIstory   Basal cell carcinoma 08/18/2006   BASOSQUAMOUS LEFT OUTER FOREHEAD TX CX3 , EXC   BPH (benign prostatic hyperplasia)    Ole ulcer    Cancer [facility])    precancerous skin lesions on leg    Cataract 2009   bilateral cataract surgery   Delayed gastric emptying    Disturbance of salivary secretion 06/20/2013   Dysphagia, pharyngoesophageal phase 03/16/2013   Dysphonia 03/16/2013   Gait abnormality 11/29/2019   GERD (gastroesophageal reflux disease)    Hiatal hernia    hiatial hernia repair  02/08/13   History of blood transfusion    Hyperlipidemia    not on medication   Hypertension    Iron  deficiency anemia    Leukoplakia  2001   Treated for   Macular degeneration    (L) wet, (R) dry   Myasthenia gravis (HCC) 03/21/2013   Peripheral neuropathy    Mild History of   Polyneuritis 1965   Resolved   SCC (squamous cell carcinoma) 03/12/2011   SCC LEFT INNER CHEEK CX3 5FU   SCC (squamous cell carcinoma) 08/24/2012   RIGHT LOWER SHIN TX WITH BX MOHS   SCC (squamous cell carcinoma) 07/26/2014   LEFT UNDER CHEEK CX3 5FU   SCC (squamous cell carcinoma) 03/12/2021   well diff- right temple (CX35FU)   Squamous cell carcinoma of skin 05/03/2008   SCC INSITU RIGHT V OF CHEST TX EXC   Ulcer    Vitamin D deficiency    Past Surgical History:  Procedure Laterality Date   Arthroscopic knee surgery     left   cateract surgeries      bilateral   CHOLECYSTECTOMY     COLONOSCOPY WITH PROPOFOL  N/A 02/16/2017   Procedure: COLONOSCOPY WITH PROPOFOL ;  Surgeon: Louder Gladis POUR, MD;  Location: [facility];  Service: Endoscopy;  Laterality: N/A;   GASTROSTOMY W/ FEEDING TUBE  [id number]   HERNIA REPAIR     Laparoscopic ventral   HIATAL HERNIA REPAIR  2004   HIATAL HERNIA REPAIR N/A 02/08/2013   Procedure:  REPAIR OFCURRENT HIATAL HERNIA, ;  Surgeon: Krystal PARAS  Rosenbower, MD;  Location: [facility];  Service: General;  Laterality: N/A;   LAPAROSCOPIC LYSIS OF ADHESIONS N/A 02/08/2013   Procedure: LAPAROSCOPIC LYSIS OF ADHESIONS;  Surgeon: Krystal JINNY Russell, MD;  Location: [facility];  Service: General;  Laterality: N/A;   LAPAROSCOPIC NISSEN FUNDOPLICATION  02/08/2013   Procedure: LAPAROSCOPIC NISSEN FUNDOPLICATION;  Surgeon: Krystal JINNY Russell, MD;  Location: [facility];  Service: General;;   Multiple oral surgeries  2002   SKIN CANCER EXCISION  10/2012   right leg-shin area   STOMACH SURGERY  2005   states his stomach had moved up toward his chest , some type of surgery performed   TONSILLECTOMY     UPPER GI ENDOSCOPY N/A 02/08/2013   Procedure: UPPER GI ENDOSCOPY;  Surgeon: Krystal JINNY Russell, MD;  Location: [facility];  Service: General;   Laterality: N/A;    Current Medications: Active Medications[1]   Allergies:   Ciprofloxacin  and Quinolones   ROS:   Please see the history of present illness.    All other systems reviewed and are negative.  EKGs/Labs/Other Studies Reviewed:    The following studies were reviewed today: Cardiac Studies & Procedures   ______________________________________________________________________________________________     ECHOCARDIOGRAM  ECHOCARDIOGRAM COMPLETE 08/15/2024  Narrative ECHOCARDIOGRAM REPORT    Patient Name:   Victor Sutton Date of Exam: 08/15/2024 Medical Rec #:  [id number]        Height:       69.0 in Accession #:    [id number]       Weight:       149.0 lb Date of Birth:  [birth year]        BSA:          1.823 m Patient Age:    89 years         BP:           110/69 mmHg Patient Gender: M                HR:           67 bpm. Exam Location:  [address]  Procedure: 2D Echo, Cardiac Doppler and Color Doppler (Both Spectral and Color Flow Doppler were utilized during procedure).  Indications:    Aortic valve stenosis, etiology of cardiac valve disease unspecified [I35.0 (ICD-10-CM)]  History:        Patient has prior history of Echocardiogram examinations, most recent 08/19/2023. Arrythmias:Atrial Fibrillation; Risk Factors:None.  Sonographer:    Rosaline Fujisawa MHA, RDMS, RVT, RDCS Referring Phys: [id number] Victor LITTIE [facility]   Sonographer Comments: Suboptimal parasternal window. Image acquisition challenging due to patient body habitus. IMPRESSIONS   1. Left ventricular ejection fraction, by estimation, is 60 to 65%. The left ventricle has normal function. Left ventricular diastolic parameters were normal. 2. Right ventricular systolic function is normal. The right ventricular size is normal. 3. The mitral valve is grossly normal. No evidence of mitral valve regurgitation. 4. There is evidence of severe paradoxical low flow low gradient AS with PV  3.0 m/sec, MG 18 mmHg, AVA 0.9 cm^2, DVI 0.22 and SVI 30. No evidence of aortic regurgitation but limited evaluation due to quality of study.. The aortic valve was not well visualized. Aortic valve regurgitation is not visualized. Severe aortic valve stenosis. 5. The inferior vena cava is normal in size with <50% respiratory variability, suggesting right atrial pressure of 8 mmHg.  Comparison(s): No changes compared to prior echo with no change in EF, gradient, AVA, DVI, or SVI.  FINDINGS  Left Ventricle: Left ventricular ejection fraction, by estimation, is 60 to 65%. The left ventricle has normal function. The left ventricular internal cavity size was normal in size. There is no left ventricular hypertrophy. Left ventricular diastolic parameters were normal.  Right Ventricle: The right ventricular size is normal. No increase in right ventricular wall thickness. Right ventricular systolic function is normal.  Left Atrium: Left atrial size was normal in size.  Right Atrium: Right atrial size was normal in size.  Pericardium: The pericardium was not well visualized.  Mitral Valve: The mitral valve is grossly normal. No evidence of mitral valve regurgitation.  Tricuspid Valve: The tricuspid valve is grossly normal. Tricuspid valve regurgitation is not demonstrated.  Aortic Valve: There is evidence of severe paradoxical low flow low gradient AS with PV 3.0 m/sec, MG 18 mmHg, AVA 0.9 cm^2, DVI 0.22 and SVI 30. No evidence of aortic regurgitation but limited evaluation due to quality of study. The aortic valve was not well visualized. Aortic valve regurgitation is not visualized. Severe aortic stenosis is present. Aortic valve mean gradient measures 18.5 mmHg. Aortic valve peak gradient measures 35.9 mmHg. Aortic valve area, by VTI measures 0.90 cm.  Aorta: The aortic root is normal in size and structure.  Venous: The inferior vena cava is normal in size with less than 50% respiratory  variability, suggesting right atrial pressure of 8 mmHg.  IAS/Shunts: The interatrial septum was not well visualized.   LEFT VENTRICLE PLAX 2D LVIDd:         4.28 cm   Diastology LVIDs:         3.63 cm   LV e' medial:    6.31 cm/s LV PW:         1.08 cm   LV E/e' medial:  10.2 LV IVS:        0.94 cm   LV e' lateral:   8.70 cm/s LVOT diam:     2.12 cm   LV E/e' lateral: 7.4 LV SV:         54 LV SV Index:   30 LVOT Area:     3.53 cm   RIGHT VENTRICLE          IVC RV Basal diam:  3.34 cm  IVC diam: 1.00 cm RV Mid diam:    2.94 cm TAPSE (M-mode): 2.0 cm  LEFT ATRIUM           Index        RIGHT ATRIUM          Index LA diam:      4.72 cm 2.59 cm/m   RA Area:     8.75 cm LA Vol (A2C): 32.3 ml 17.72 ml/m  RA Volume:   15.40 ml 8.45 ml/m LA Vol (A4C): 46.1 ml 25.29 ml/m AORTIC VALVE AV Area (Vmax):    0.89 cm AV Area (Vmean):   0.96 cm AV Area (VTI):     0.90 cm AV Vmax:           299.50 cm/s AV Vmean:          184.800 cm/s AV VTI:            0.607 m AV Peak Grad:      35.9 mmHg AV Mean Grad:      18.5 mmHg LVOT Vmax:         75.90 cm/s LVOT Vmean:        50.100 cm/s LVOT VTI:  0.154 m LVOT/AV VTI ratio: 0.25  AORTA Ao Root diam: 3.33 cm  MITRAL VALVE MV Area (PHT): 2.80 cm    SHUNTS MV Decel Time: 271 msec    Systemic VTI:  0.15 m MV E velocity: 64.50 cm/s  Systemic Diam: 2.12 cm MV A velocity: 87.00 cm/s MV E/A ratio:  0.74  Franck Azobou Tonleu Electronically signed by Joelle Cedars Tonleu Signature Date/Time: 08/15/2024/4:07:02 PM    Final    MONITORS  LONG TERM MONITOR (3-14 DAYS) 01/02/2021  Narrative  Frequent PACs (6.4%)  Occasional PVCs (1.7%).  6 episodes of SVT, longest lasting 14 beats   Patch Wear Time:  13 days and 23 hours (2022-03-31T11:36:41-0400 to 2022-04-14T11:19:58-0400)  Patient had a min HR of 44 bpm, max HR of 160 bpm, and avg HR of 75 bpm. Predominant underlying rhythm was Sinus Rhythm. 6 Supraventricular  Tachycardia runs occurred, the run with the fastest interval lasting 10 beats with a max rate of 160 bpm, the longest lasting 14 beats with an avg rate of 122 bpm. Isolated SVEs were frequent (6.4%, [id number]), SVE Couplets were rare (<1.0%, 432), and SVE Triplets were rare (<1.0%, 15). Isolated VEs were occasional (1.7%, [id number]), VE Couplets were rare (<1.0%, 396), and VE Triplets were rare (<1.0%, 17). Ventricular Bigeminy and Trigeminy were present. Difficulty discerning atrial activity making definitive diagnosis difficult to ascertain.       ______________________________________________________________________________________________      EKG:        Recent Labs: 12/17/2023: ALT 12 08/19/2024: BUN 15; Creatinine, Ser 0.83; Hemoglobin 13.7; Magnesium  2.0; Platelets 223; Potassium 4.0; Sodium 136  Recent Lipid Panel    Component Value Date/Time   CHOL 118 02/14/2013 0530   TRIG 79 02/21/2013 0520        Physical Exam:    VS:  BP (!) 142/64   Pulse 76   Temp (!) 97.5 F (36.4 C) (Oral)   Resp 17   Ht 5' 9 (1.753 m)   Wt 66.2 kg   SpO2 94%   BMI 21.56 kg/m     Wt Readings from Last 3 Encounters:  09/06/24 66.2 kg  08/26/24 66.3 kg  08/19/24 68.2 kg     GEN: Thin, elderly appearing male in no acute distress HEENT: Normal NECK: No JVD; bilateral carotid bruits LYMPHATICS: No lymphadenopathy CARDIAC: RRR, 2/6 crescendo decrescendo murmur, mid-to-late peaking, A2 present RESPIRATORY:  Clear to auscultation without rales, wheezing or rhonchi  ABDOMEN: Soft, non-tender, non-distended MUSCULOSKELETAL: Trace bilateral ankle edema; No deformity  SKIN: Warm and dry NEUROLOGIC:  Alert and oriented x 3 PSYCHIATRIC:  Normal affect   Assessment & Plan        Informed Consent   Shared Decision Making/Informed Consent The risks [stroke (1 in 1000), death (1 in 1000), kidney failure [usually temporary] (1 in 500), bleeding (1 in 200), allergic reaction [possibly serious] (1  in 200)], benefits (diagnostic support and management of coronary artery disease) and alternatives of a cardiac catheterization were discussed in detail with Mr. Brabson and he is willing to proceed.       Medication Adjustments/Labs and Tests Ordered: Current medicines are reviewed at length with the patient today.  Concerns regarding medicines are outlined above.  Orders Placed This Encounter  Procedures   Informed Consent Details: Physician/Practitioner Attestation; Transcribe to consent form and obtain patient signature   Apply Cardiac or Vascular Catheterization and/or Intervention Care Plan   Confirm CBC and BMP (or CMP) results within 7 days for inpatient and 30  days for outpatient: Outpatients with severe anemia (hgb<10, CKD, severe thrombocytopenia plts<100) labs should be within 10 days. Only draw PT/INR on patients that are on Coumadin, Hgb<10, have liver disease (cirrhosis, liver CA, hepatitis, etc). Urine pregnancy test within hospital admission for inpatients of child bearing age, for outpatients day of procedure.   Confirm EKG performed within 30 days for cardiac procedures and 12 months for peripheral vascular procedures.  Place order for EKG if missing or not within timeframe.   Verify aspirin  and / or anti-platelet medication (Plavix, Effient, Brilinta) dose available for cardiac / peripheral vascular procedure day. IF ordered daily / once, adjust schedule to administer before procedure.   Weigh patient   Initiate Cath/PCI clinical path; encourage patient to watch CCTV video   Insert peripheral IV   Insert 2nd peripheral IV site-Saline lock IV   Meds ordered this encounter  Medications   aspirin  chewable tablet 81 mg   free water  500 mL   sodium chloride  flush (NS) 0.9 % injection 3 mL   sodium chloride  flush (NS) 0.9 % injection 3 mL   0.9 %  sodium chloride  infusion    There are no outpatient Patient Instructions on file for this admission.   Signed, Newman JINNY Lawrence, MD  09/06/2024 9:05 AM    [facility].5     [1]  Current Meds  Medication Sig   ascorbic acid (VITAMIN C) 250 MG tablet Take 250 mg by mouth daily.   chlorhexidine  (PERIDEX ) 0.12 % solution Use as directed 5 mLs in the mouth or throat 2 (two) times daily.   denosumab  (PROLIA ) 60 MG/ML SOSY injection Inject 60 mg into the skin every 6 (six) months.   hydrochlorothiazide  (HYDRODIURIL ) 25 MG tablet Take 1 tablet (25 mg total) by mouth daily.   Lutein 40 MG CAPS Take 40 mg by mouth daily.   Multiple Vitamins-Minerals (ICAPS) CAPS Take 1 capsule by mouth daily. Preservision   pantoprazole  (PROTONIX ) 40 MG tablet Take 40 mg by mouth daily. May take an additional 40mg s as needed for heartburn   potassium chloride  (KLOR-CON ) 10 MEQ tablet TAKE 1 TABLET BY MOUTH ONCE DAILY   Probiotic Product (ALIGN) 4 MG CAPS Take 4 mg by mouth daily.   pyridostigmine  (MESTINON ) 60 MG tablet Take 0.5 tablets (30 mg total) by mouth 2 (two) times daily as needed.   tamsulosin (FLOMAX) 0.4 MG CAPS capsule Take 0.4 mg by mouth at bedtime.   UNABLE TO FIND Take 1 tablet by mouth daily. Med Name:EyePromise  Zeaxanthin 10mg  and lutein 10mg    Vitamin D, Ergocalciferol, (DRISDOL) 50000 UNITS CAPS capsule Take 50,000 Units by mouth every 7 (seven) days. ON FRIDAYS

## 2024-09-06 NOTE — Progress Notes (Signed)
 Discharge instructions reviewed with patient and wife at bedside. Denies questions concerns. PT  ambulated in the hallway, was able to void without difficulty. Incision site remains clean dry and intact. No s/s of complications. PT escorted from the unit via wheel chair to personal vehicle.

## 2024-09-06 NOTE — Interval H&P Note (Signed)
 History and Physical Interval Note:  09/06/2024 9:06 AM  Victor Sutton  has presented today for surgery, with the diagnosis of Tavr workup.  The various methods of treatment have been discussed with the patient and family. After consideration of risks, benefits and other options for treatment, the patient has consented to  Procedures: RIGHT/LEFT HEART CATH AND CORONARY ANGIOGRAPHY (N/A) as a surgical intervention.  The patient's history has been reviewed, patient examined, no change in status, stable for surgery.  I have reviewed the patient's chart and labs.  Questions were answered to the patient's satisfaction.     Destry Bezdek J Cashay Manganelli

## 2024-09-06 NOTE — Discharge Instructions (Addendum)

## 2024-09-07 ENCOUNTER — Encounter (HOSPITAL_COMMUNITY): Payer: Self-pay | Admitting: Cardiology

## 2024-09-12 ENCOUNTER — Emergency Department (HOSPITAL_COMMUNITY)

## 2024-09-12 ENCOUNTER — Encounter (HOSPITAL_COMMUNITY): Payer: Self-pay

## 2024-09-12 ENCOUNTER — Inpatient Hospital Stay (HOSPITAL_COMMUNITY)

## 2024-09-12 ENCOUNTER — Telehealth: Payer: Self-pay | Admitting: Student

## 2024-09-12 ENCOUNTER — Inpatient Hospital Stay (HOSPITAL_COMMUNITY)
Admission: EM | Admit: 2024-09-12 | Discharge: 2024-09-15 | DRG: 871 | Disposition: A | Discharge status: Home Health | Attending: Family Medicine | Admitting: Family Medicine

## 2024-09-12 ENCOUNTER — Other Ambulatory Visit: Payer: Self-pay

## 2024-09-12 DIAGNOSIS — Z1152 Encounter for screening for COVID-19: Secondary | ICD-10-CM

## 2024-09-12 DIAGNOSIS — A419 Sepsis, unspecified organism: Secondary | ICD-10-CM | POA: Diagnosis present

## 2024-09-12 DIAGNOSIS — Z87891 Personal history of nicotine dependence: Secondary | ICD-10-CM

## 2024-09-12 DIAGNOSIS — Z8249 Family history of ischemic heart disease and other diseases of the circulatory system: Secondary | ICD-10-CM

## 2024-09-12 DIAGNOSIS — M48061 Spinal stenosis, lumbar region without neurogenic claudication: Secondary | ICD-10-CM | POA: Diagnosis present

## 2024-09-12 DIAGNOSIS — I10 Essential (primary) hypertension: Secondary | ICD-10-CM | POA: Diagnosis present

## 2024-09-12 DIAGNOSIS — I350 Nonrheumatic aortic (valve) stenosis: Secondary | ICD-10-CM | POA: Diagnosis present

## 2024-09-12 DIAGNOSIS — N400 Enlarged prostate without lower urinary tract symptoms: Secondary | ICD-10-CM | POA: Diagnosis present

## 2024-09-12 DIAGNOSIS — Z800 Family history of malignant neoplasm of digestive organs: Secondary | ICD-10-CM | POA: Diagnosis not present

## 2024-09-12 DIAGNOSIS — Z85828 Personal history of other malignant neoplasm of skin: Secondary | ICD-10-CM | POA: Diagnosis not present

## 2024-09-12 DIAGNOSIS — J189 Pneumonia, unspecified organism: Secondary | ICD-10-CM | POA: Diagnosis present

## 2024-09-12 DIAGNOSIS — E872 Acidosis, unspecified: Secondary | ICD-10-CM | POA: Diagnosis present

## 2024-09-12 DIAGNOSIS — E785 Hyperlipidemia, unspecified: Secondary | ICD-10-CM | POA: Diagnosis present

## 2024-09-12 DIAGNOSIS — R55 Syncope and collapse: Secondary | ICD-10-CM | POA: Diagnosis present

## 2024-09-12 DIAGNOSIS — G9341 Metabolic encephalopathy: Secondary | ICD-10-CM | POA: Diagnosis present

## 2024-09-12 DIAGNOSIS — E538 Deficiency of other specified B group vitamins: Secondary | ICD-10-CM | POA: Diagnosis present

## 2024-09-12 DIAGNOSIS — Z87892 Personal history of anaphylaxis: Secondary | ICD-10-CM

## 2024-09-12 DIAGNOSIS — Z881 Allergy status to other antibiotic agents status: Secondary | ICD-10-CM | POA: Diagnosis not present

## 2024-09-12 DIAGNOSIS — Z860101 Personal history of adenomatous and serrated colon polyps: Secondary | ICD-10-CM

## 2024-09-12 DIAGNOSIS — I493 Ventricular premature depolarization: Secondary | ICD-10-CM | POA: Diagnosis present

## 2024-09-12 DIAGNOSIS — I2510 Atherosclerotic heart disease of native coronary artery without angina pectoris: Secondary | ICD-10-CM | POA: Diagnosis present

## 2024-09-12 DIAGNOSIS — I4892 Unspecified atrial flutter: Secondary | ICD-10-CM | POA: Diagnosis present

## 2024-09-12 DIAGNOSIS — G7000 Myasthenia gravis without (acute) exacerbation: Secondary | ICD-10-CM | POA: Diagnosis present

## 2024-09-12 DIAGNOSIS — Z803 Family history of malignant neoplasm of breast: Secondary | ICD-10-CM | POA: Diagnosis not present

## 2024-09-12 DIAGNOSIS — I471 Supraventricular tachycardia, unspecified: Secondary | ICD-10-CM | POA: Diagnosis present

## 2024-09-12 DIAGNOSIS — R54 Age-related physical debility: Secondary | ICD-10-CM | POA: Diagnosis present

## 2024-09-12 DIAGNOSIS — K219 Gastro-esophageal reflux disease without esophagitis: Secondary | ICD-10-CM | POA: Diagnosis present

## 2024-09-12 DIAGNOSIS — Z9181 History of falling: Secondary | ICD-10-CM

## 2024-09-12 DIAGNOSIS — Z833 Family history of diabetes mellitus: Secondary | ICD-10-CM | POA: Diagnosis not present

## 2024-09-12 DIAGNOSIS — Z888 Allergy status to other drugs, medicaments and biological substances status: Secondary | ICD-10-CM

## 2024-09-12 DIAGNOSIS — Z8042 Family history of malignant neoplasm of prostate: Secondary | ICD-10-CM

## 2024-09-12 DIAGNOSIS — Z825 Family history of asthma and other chronic lower respiratory diseases: Secondary | ICD-10-CM

## 2024-09-12 LAB — CG4 I-STAT (LACTIC ACID)
Lactic Acid, Venous: 2.8 mmol/L (ref 0.5–1.9)
Lactic Acid, Venous: 1.4 mmol/L (ref 0.5–1.9)

## 2024-09-12 LAB — CBC WITH DIFFERENTIAL/PLATELET
Abs Immature Granulocytes: 0.09 K/uL — ABNORMAL HIGH (ref 0.00–0.07)
Basophils Absolute: 0 K/uL (ref 0.0–0.1)
Basophils Relative: 0 %
Eosinophils Absolute: 0 K/uL (ref 0.0–0.5)
Eosinophils Relative: 0 %
HCT: 39.4 % (ref 39.0–52.0)
Hemoglobin: 13.6 g/dL (ref 13.0–17.0)
Immature Granulocytes: 1 %
Lymphocytes Relative: 1 %
Lymphs Abs: 0.2 K/uL — ABNORMAL LOW (ref 0.7–4.0)
MCH: 32.7 pg (ref 26.0–34.0)
MCHC: 34.5 g/dL (ref 30.0–36.0)
MCV: 94.7 fL (ref 80.0–100.0)
Monocytes Absolute: 1.1 K/uL — ABNORMAL HIGH (ref 0.1–1.0)
Monocytes Relative: 6 %
Neutro Abs: 16.6 K/uL — ABNORMAL HIGH (ref 1.7–7.7)
Neutrophils Relative %: 92 %
Platelets: 202 K/uL (ref 150–400)
RBC: 4.16 MIL/uL — ABNORMAL LOW (ref 4.22–5.81)
RDW: 12.7 % (ref 11.5–15.5)
WBC: 18 K/uL — ABNORMAL HIGH (ref 4.0–10.5)
nRBC: 0 % (ref 0.0–0.2)

## 2024-09-12 LAB — PROTIME-INR
INR: 1.1 (ref 0.8–1.2)
Prothrombin Time: 15.1 s (ref 11.4–15.2)

## 2024-09-12 LAB — URINALYSIS, W/ REFLEX TO CULTURE (INFECTION SUSPECTED)
Bilirubin Urine: NEGATIVE
Glucose, UA: NEGATIVE mg/dL
Hgb urine dipstick: NEGATIVE
Ketones, ur: 5 mg/dL — AB
Leukocytes,Ua: NEGATIVE
Nitrite: NEGATIVE
Protein, ur: 30 mg/dL — AB
Specific Gravity, Urine: 1.019 (ref 1.005–1.030)
pH: 6 (ref 5.0–8.0)

## 2024-09-12 LAB — COMPREHENSIVE METABOLIC PANEL WITH GFR
ALT: 12 U/L (ref 0–44)
AST: 28 U/L (ref 15–41)
Albumin: 3.6 g/dL (ref 3.5–5.0)
Alkaline Phosphatase: 42 U/L (ref 38–126)
Anion gap: 11 (ref 5–15)
BUN: 12 mg/dL (ref 8–23)
CO2: 26 mmol/L (ref 22–32)
Calcium: 9.1 mg/dL (ref 8.9–10.3)
Chloride: 97 mmol/L — ABNORMAL LOW (ref 98–111)
Creatinine, Ser: 0.72 mg/dL (ref 0.61–1.24)
GFR, Estimated: >60 mL/min
Glucose, Bld: 103 mg/dL — ABNORMAL HIGH (ref 70–99)
Potassium: 3.3 mmol/L — ABNORMAL LOW (ref 3.5–5.1)
Sodium: 133 mmol/L — ABNORMAL LOW (ref 135–145)
Total Bilirubin: 1.4 mg/dL — ABNORMAL HIGH (ref 0.0–1.2)
Total Protein: 6.3 g/dL — ABNORMAL LOW (ref 6.5–8.1)

## 2024-09-12 LAB — RESP PANEL BY RT-PCR (RSV, FLU A&B, COVID)  RVPGX2
Influenza A by PCR: NEGATIVE
Influenza B by PCR: NEGATIVE
Resp Syncytial Virus by PCR: NEGATIVE
SARS Coronavirus 2 by RT PCR: NEGATIVE

## 2024-09-12 LAB — TROPONIN T, HIGH SENSITIVITY: Troponin T High Sensitivity: 24 ng/L — ABNORMAL HIGH (ref 0–19)

## 2024-09-12 MED ORDER — SODIUM CHLORIDE 0.9 % IV BOLUS
500.0000 mL | Freq: Once | INTRAVENOUS | Status: AC
  Administered 2024-09-12: 500 mL via INTRAVENOUS

## 2024-09-12 MED ORDER — SODIUM CHLORIDE 0.9 % IV SOLN
2.0000 g | Freq: Two times a day (BID) | INTRAVENOUS | Status: DC
  Administered 2024-09-13 – 2024-09-14 (×4): 2 g via INTRAVENOUS
  Filled 2024-09-12 (×4): qty 12.5

## 2024-09-12 MED ORDER — CEFEPIME HCL 2 G IV SOLR
2.0000 g | Freq: Once | INTRAVENOUS | Status: AC
  Administered 2024-09-12: 2 g via INTRAVENOUS
  Filled 2024-09-12: qty 12.5

## 2024-09-12 MED ORDER — POTASSIUM CHLORIDE CRYS ER 20 MEQ PO TBCR
40.0000 meq | EXTENDED_RELEASE_TABLET | Freq: Once | ORAL | Status: AC
  Administered 2024-09-12: 40 meq via ORAL
  Filled 2024-09-12: qty 2

## 2024-09-12 MED ORDER — MORPHINE SULFATE (PF) 2 MG/ML IV SOLN: 2.0000 mg | INTRAVENOUS | Status: DC | PRN

## 2024-09-12 MED ORDER — HEPARIN SODIUM (PORCINE) 5000 UNIT/ML IJ SOLN
5000.0000 [IU] | Freq: Three times a day (TID) | INTRAMUSCULAR | Status: DC
  Administered 2024-09-12 – 2024-09-14 (×7): 5000 [IU] via SUBCUTANEOUS
  Filled 2024-09-12 (×8): qty 1

## 2024-09-12 MED ORDER — SODIUM CHLORIDE 0.9 % IV SOLN: 2.0000 g | Freq: Three times a day (TID) | INTRAVENOUS | Status: DC

## 2024-09-12 MED ORDER — METRONIDAZOLE 500 MG/100ML IV SOLN
500.0000 mg | Freq: Two times a day (BID) | INTRAVENOUS | Status: DC
  Administered 2024-09-13 (×2): 500 mg via INTRAVENOUS
  Filled 2024-09-12 (×2): qty 100

## 2024-09-12 MED ORDER — METRONIDAZOLE 500 MG/100ML IV SOLN
500.0000 mg | Freq: Once | INTRAVENOUS | Status: AC
  Administered 2024-09-12: 500 mg via INTRAVENOUS
  Filled 2024-09-12: qty 100

## 2024-09-12 MED ORDER — SODIUM CHLORIDE 0.9 % IV BOLUS: 1000.0000 mL | Freq: Once | INTRAVENOUS | Status: DC

## 2024-09-12 MED ORDER — HYDROCODONE-ACETAMINOPHEN 5-325 MG PO TABS
1.0000 | ORAL_TABLET | ORAL | Status: DC | PRN
  Filled 2024-09-12: qty 1

## 2024-09-12 MED ORDER — VANCOMYCIN HCL 1250 MG/250ML IV SOLN
1250.0000 mg | INTRAVENOUS | Status: DC
  Administered 2024-09-13: 1250 mg via INTRAVENOUS
  Filled 2024-09-12 (×2): qty 250

## 2024-09-12 MED ORDER — VANCOMYCIN HCL 1500 MG/300ML IV SOLN
1500.0000 mg | Freq: Once | INTRAVENOUS | Status: AC
  Administered 2024-09-12: 1500 mg via INTRAVENOUS
  Filled 2024-09-12: qty 300

## 2024-09-12 MED ORDER — SODIUM CHLORIDE 0.9% FLUSH
3.0000 mL | Freq: Two times a day (BID) | INTRAVENOUS | Status: DC
  Administered 2024-09-13 – 2024-09-15 (×5): 3 mL via INTRAVENOUS

## 2024-09-12 MED ORDER — PIPERACILLIN-TAZOBACTAM 3.375 G IVPB 30 MIN: 3.3750 g | Freq: Once | INTRAVENOUS | Status: DC

## 2024-09-12 MED ORDER — ACETAMINOPHEN 325 MG PO TABS
650.0000 mg | ORAL_TABLET | Freq: Four times a day (QID) | ORAL | Status: DC | PRN
  Administered 2024-09-13: 650 mg via ORAL
  Filled 2024-09-12: qty 2

## 2024-09-12 MED ORDER — SODIUM CHLORIDE 0.9 % IV SOLN: INTRAVENOUS | Status: DC

## 2024-09-12 MED ORDER — ACETAMINOPHEN 650 MG RE SUPP: 650.0000 mg | Freq: Four times a day (QID) | RECTAL | Status: DC | PRN

## 2024-09-12 NOTE — ED Triage Notes (Signed)
 Patient BIB EMS from home with possible sepsis. Patient got up to go to the bathroom this morning, felt dizzy, and had a semi fall where he didn't fall completely to the ground, and got stuck between the shower and the toilet. Patient was diaphoretic and pale upon EMS arrival with a pressure of 60. Patient had a heart cath on the 23rd. Patient states that he started feeling sick after 12AM last night. Patient c/o left sided chest pain upon arrival into room.

## 2024-09-12 NOTE — Progress Notes (Signed)
 Pharmacy Antibiotic Note  Victor Sutton is a 89 y.o. male admitted on 09/12/2024 presenting with concern for sepsis.  Pharmacy has been consulted for vancomycin  and cefepime  dosing.  Vancomycin  1500 mg Iv x 1 given in ED  Plan: Vancomycin  1250 mg IV q 24h (eAUC 543) Cefepime  2g IV q 12h Monitor renal function, Cx/PCR to narrow Vancomycin  levels as indicated  Height: 5' 9 (175.3 cm) Weight: 66.2 kg (146 lb) IBW/kg (Calculated) : 70.7  Temp (24hrs), Avg:98.2 F (36.8 C), Min:98 F (36.7 C), Max:98.4 F (36.9 C)  Recent Labs  Lab 09/12/24 0909 09/12/24 0937 09/12/24 1156  WBC 18.0*  --   --   CREATININE 0.72  --   --   LATICACIDVEN  --  2.8* 1.4    Estimated Creatinine Clearance: 52.9 mL/min (by C-G formula based on SCr of 0.72 mg/dL).    Allergies[1]  Dorn Poot, PharmD, [facility] Clinical Pharmacist ED Pharmacist Phone # [phone] 09/12/2024 4:50 PM     [1]  Allergies Allergen Reactions   Ciprofloxacin  Anaphylaxis    Avoid due to myasthenia gravis  Other Reaction(s): Not to take with MS   Quinolones Anaphylaxis    Avoid due to myasthenia gravis

## 2024-09-12 NOTE — ED Provider Notes (Signed)
 " [facility] Provider Note   CSN: [id number] Arrival date & time: 09/12/24  9178     Patient presents with: Code Sepsis   Victor Sutton is a 89 y.o. male who presents to the emergency department via EMS with a chief complaint of fall as well as chills and chest pain.  Patient states that last night around 12 AM he began to feel unwell.  States that he got up this morning to go use the restroom and became weak and had a fall.  The patient ended up wedged in between the shower as well as the toilet.  He cannot remember the fall.  Wife who is at bedside states that it took 2 EMS workers to get the patient out of this area and then afterwards the patients tongue was sticking out more than normal. Wife is concerned about possible stroke or seizure versus other reasons for this episode.  Denies facial droop, slurred speech, unilateral weakness.  Per EMS report blood pressure upon arrival was 60.  Patient recently had a heart catheterization on 09/06/2024.  Denies known sick contacts, fever, chills, or source of infection.  Is on 2 L of nasal cannula oxygen at this time, he is not typically on oxygen at his baseline.  Past medical history significant for myasthenia gravis, iron  deficiency anemia, Bell's palsy, atrial fibrillation not on anticoagulation, etc.   HPI     Prior to Admission medications  Medication Sig Start Date End Date Taking? Authorizing Provider  Apoaequorin (PREVAGEN) 10 MG CAPS Take 1 Capful by mouth in the morning.   Yes [provider]  ascorbic acid (VITAMIN C) 250 MG tablet Take 250 mg by mouth 2 (two) times daily. Am + dinner   Yes [provider]  CALCIUM PO Take 1 tablet by mouth daily.   Yes [provider]  chlorhexidine  (PERIDEX ) 0.12 % solution Use as directed 5 mLs in the mouth or throat 2 (two) times daily. 08/24/23  Yes [provider]  denosumab  (PROLIA ) 60 MG/ML SOSY injection Inject  60 mg into the skin every 6 (six) months. 04/02/23  Yes [provider]  hydrochlorothiazide  (HYDRODIURIL ) 25 MG tablet Take 1 tablet (25 mg total) by mouth daily. 04/07/16  Yes Jenel Carlin POUR, MD  Lutein 40 MG CAPS Take 40 mg by mouth daily.   Yes [provider]  Multiple Vitamins-Minerals (ICAPS) CAPS Take 1 capsule by mouth in the morning and at bedtime. Preservision: Take in 1 capsule in morning and 1 in evening at dinner time.   Yes [provider]  pantoprazole  (PROTONIX ) 40 MG tablet Take 40 mg by mouth daily. May take an additional 40mg s as needed for heartburn 09/27/13  Yes [provider]  Polyethyl Glycol-Propyl Glycol (SYSTANE ULTRA) 0.4-0.3 % SOLN Apply 1-2 drops to eye daily as needed (dry eye).   Yes [provider]  Polyethyl Glycol-Propyl Glycol (SYSTANE) 0.4-0.3 % GEL ophthalmic gel Place 1 Application into both eyes at bedtime as needed (dry eye).   Yes [provider]  potassium chloride  (KLOR-CON ) 10 MEQ tablet TAKE 1 TABLET BY MOUTH ONCE DAILY 03/14/21  Yes Jenel Carlin POUR, MD  Probiotic Product (ALIGN) 4 MG CAPS Take 4 mg by mouth daily. 10/16/17  Yes [provider]  pyridostigmine  (MESTINON ) 60 MG tablet Take 0.5 tablets (30 mg total) by mouth 2 (two) times daily as needed. 08/29/24  Yes Gayland Lauraine PARAS, NP  tamsulosin (FLOMAX) 0.4  MG CAPS capsule Take 0.4 mg by mouth at bedtime. 10/24/17  Yes [provider]  UNABLE TO FIND Take 1 tablet by mouth daily. Med Name:EyePromise  Zeaxanthin 10mg  and lutein 10mg    Yes [provider]  Vitamin D, Ergocalciferol, (DRISDOL) 50000 UNITS CAPS capsule Take 50,000 Units by mouth every 7 (seven) days. ON FRIDAYS   Yes [provider]    Allergies: Ciprofloxacin  and Quinolones    Review of Systems  Cardiovascular:  Positive for chest pain.    Updated Vital Signs BP (!) 108/55   Pulse 79   Temp 98.4 F (36.9 C) (Oral)   Resp 19   Ht 5' 9  (1.753 m)   Wt 66.2 kg   SpO2 98%   BMI 21.56 kg/m   Physical Exam Vitals and nursing note reviewed.  Constitutional:      General: He is awake.     Comments: Chronically ill-appearing, patient on 2L of [place] O2  HENT:     Head: Normocephalic.     Comments: Mild erythema surrounding left upper eyebrow Eyes:     General: No scleral icterus.    Comments: Vision grossly intact  Neck:     Comments: Patient able to look left, right, touch chin to chest, and look up at the ceiling without significant discomfort, no appreciable bony cervical spine tenderness Cardiovascular:     Rate and Rhythm: Normal rate and regular rhythm.  Pulmonary:     Effort: No respiratory distress.     Breath sounds: No stridor. No wheezing, rhonchi or rales.     Comments: Patient talking in full sentences  Abdominal:     General: Abdomen is flat. There is no distension.     Tenderness: There is no abdominal tenderness. There is no guarding.  Musculoskeletal:        General: Normal range of motion.     Cervical back: Normal range of motion. No tenderness.     Right lower leg: No edema.     Left lower leg: No edema.     Comments: Grossly normal ROM of all 4 extremities, no thoracic or lumbar spinous tenderness, no chest wall pain, mild tenderness with palpation of left hip, no other lower extremity pain, negative log roll test of bilateral lower extremities, Pelvis feels stable  Skin:    General: Skin is warm.     Capillary Refill: Capillary refill takes less than 2 seconds.     Comments: Small skin tear present to left thigh, non-bleeding  Neurological:     General: No focal deficit present.     Mental Status: He is alert and oriented to person, place, and time.  Psychiatric:        Mood and Affect: Mood normal.        Behavior: Behavior normal. Behavior is cooperative.     (all labs ordered are listed, but only abnormal results are displayed) Labs Reviewed  COMPREHENSIVE METABOLIC PANEL WITH GFR -  Abnormal; Notable for the following components:      Result Value   Sodium 133 (*)    Potassium 3.3 (*)    Chloride 97 (*)    Glucose, Bld 103 (*)    Total Protein 6.3 (*)    Total Bilirubin 1.4 (*)    All other components within normal limits  CBC WITH DIFFERENTIAL/PLATELET - Abnormal; Notable for the following components:   WBC 18.0 (*)    RBC 4.16 (*)    Neutro Abs 16.6 (*)  Lymphs Abs 0.2 (*)    Monocytes Absolute 1.1 (*)    Abs Immature Granulocytes 0.09 (*)    All other components within normal limits  URINALYSIS, W/ REFLEX TO CULTURE (INFECTION SUSPECTED) - Abnormal; Notable for the following components:   Color, Urine AMBER (*)    APPearance HAZY (*)    Ketones, ur 5 (*)    Protein, ur 30 (*)    Bacteria, UA RARE (*)    All other components within normal limits  TROPONIN T, HIGH SENSITIVITY - Abnormal; Notable for the following components:   Troponin T High Sensitivity 24 (*)    All other components within normal limits  RESP PANEL BY RT-PCR (RSV, FLU A&B, COVID)  RVPGX2  CULTURE, BLOOD (ROUTINE X 2)  CULTURE, BLOOD (ROUTINE X 2)  MRSA NEXT GEN BY PCR, NASAL  PROTIME-INR  MAGNESIUM   PROCALCITONIN  D-DIMER, QUANTITATIVE  MAGNESIUM   COMPREHENSIVE METABOLIC PANEL WITH GFR  CBC WITH DIFFERENTIAL/PLATELET  PHOSPHORUS  CBC  I-STAT CG4 LACTIC ACID, ED  I-STAT CG4 LACTIC ACID, ED    EKG: EKG Interpretation Date/Time:  Monday September 12 2024 09:51:04 EST Ventricular Rate:  69 PR Interval:  229 QRS Duration:  95 QT Interval:  394 QTC Calculation: 423 R Axis:   11  Text Interpretation: Sinus rhythm Prolonged PR interval Abnormal R-wave progression, early transition Confirmed by Freddi Hamilton [phone]) on 09/12/2024 12:40:36 PM  Radiology: [facility] Chest Port 1 View Result Date: 09/12/2024 EXAM: 1 VIEW(S) XRAY OF THE CHEST 09/12/2024 09:20:00 AM COMPARISON: Chest x-ray 06/05/2018 CLINICAL HISTORY: Questionable sepsis - evaluate for abnormality FINDINGS: LUNGS AND  PLEURA: Low lung volumes with bronchovascular crowding. Elevated right hemidiaphragm. Left basilar patchy opacities. No pleural effusion. No pneumothorax. HEART AND MEDIASTINUM: No acute abnormality of the cardiac and mediastinal silhouettes. BONES AND SOFT TISSUES: No acute osseous abnormality. IMPRESSION: 1. Low lung volumes with left basilar patchy opacities. Findings may represent infectious/inflammatory process versus atelectasis. Recommend repeat PA and lateral view of the chest for further evaluation. Electronically signed by: Morgane Naveau MD 09/12/2024 01:00 PM EST RP Workstation: [id number]   DG Pelvis Portable Result Date: 09/12/2024 EXAM: 1 or 2 VIEW(S) XRAY OF THE PELVIS 09/12/2024 09:20:00 AM COMPARISON: None available. CLINICAL HISTORY: fall, left hip pain FINDINGS: BONES AND JOINTS: No acute fracture. No malalignment. Degenerative changes of the lower lumbar spine. SOFT TISSUES: Hernia repair tacks partially visualized overlying the right mid abdomen. Vascular calcifications. IMPRESSION: 1. No acute findings. Electronically signed by: Morgane Naveau MD 09/12/2024 11:34 AM EST RP Workstation: [id number]   CT Cervical Spine Wo Contrast Result Date: 09/12/2024 EXAM: CT CERVICAL SPINE WITHOUT CONTRAST 09/12/2024 09:27:34 AM TECHNIQUE: CT of the cervical spine was performed without the administration of intravenous contrast. Multiplanar reformatted images are provided for review. Automated exposure control, iterative reconstruction, and/or weight based adjustment of the mA/kV was utilized to reduce the radiation dose to as low as reasonably achievable. COMPARISON: MRI cervical spine 11/08/2021. CLINICAL HISTORY: mechanical fall. FINDINGS: BONES AND ALIGNMENT: No acute fracture or traumatic malalignment. DEGENERATIVE CHANGES: Overall mild multilevel disc degeneration for age. Moderate multilevel facet arthrosis. Mild spinal stenosis at C3-C4. SOFT TISSUES: No prevertebral soft tissue swelling.  Partially visualized patchy left upper lobe pulmonary opacities. IMPRESSION: 1. No acute cervical spine fracture or traumatic malalignment. 2. Partially visualized patchy left upper lobe pulmonary opacities, which could be infectious or inflammatory; correlate with pending chest radiograph. Electronically signed by: Dasie Hamburg MD 09/12/2024 10:02 AM EST RP Workstation: [id number]   CT Head  Wo Contrast Result Date: 09/12/2024 EXAM: CT HEAD WITHOUT CONTRAST 09/12/2024 09:27:34 AM TECHNIQUE: CT of the head was performed without the administration of intravenous contrast. Automated exposure control, iterative reconstruction, and/or weight based adjustment of the mA/kV was utilized to reduce the radiation dose to as low as reasonably achievable. COMPARISON: Head CT 01/01/2013 CLINICAL HISTORY: syncopal episode, fall FINDINGS: BRAIN AND VENTRICLES: There is no evidence of an acute infarct, intracranial hemorrhage, mass, midline shift, hydrocephalus, or extra-axial fluid collection. Mild cerebral atrophy is within normal limits for age. Cerebral white matter hypodensities are nonspecific but compatible with mild chronic small vessel ischemic disease. Calcified atherosclerosis at the skull base. ORBITS: Bilateral cataract extraction. SINUSES: Scattered mild, partially polypoid mucosal thickening in the paranasal sinuses. Clear mastoid air cells. SOFT TISSUES AND SKULL: No acute soft tissue abnormality. No skull fracture. IMPRESSION: 1. No acute intracranial abnormality. 2. Mild chronic small vessel ischemic disease. Electronically signed by: Dasie Hamburg MD 09/12/2024 09:57 AM EST RP Workstation: [id number]     .Laceration Repair  Date/Time: 09/12/2024 3:41 PM  Performed by: Janetta Terrall FALCON, PA-C Authorized by: Janetta Terrall FALCON, PA-C   Consent:    Consent obtained:  Verbal   Consent given by:  Patient   Risks discussed:  Infection, pain, retained foreign body, need for additional repair, poor cosmetic  result and poor wound healing   Alternatives discussed:  No treatment and observation Universal protocol:    Procedure explained and questions answered to patient or proxy's satisfaction: yes     Patient identity confirmed:  Verbally with patient and arm band Anesthesia:    Anesthesia method:  None Laceration details:    Location:  Leg   Leg location:  L upper leg   Length (cm):  1.5 Treatment:    Area cleansed with:  Saline   Amount of cleaning:  Standard Skin repair:    Repair method:  Steri-Strips   Number of Steri-Strips:  4 Approximation:    Approximation:  Close Repair type:    Repair type:  Simple Post-procedure details:    Dressing:  Open (no dressing)   Procedure completion:  Tolerated well, no immediate complications .Critical Care  Performed by: Janetta Terrall FALCON, PA-C Authorized by: Janetta Terrall FALCON, PA-C   Critical care provider statement:    Critical care time (minutes):  30   Critical care was necessary to treat or prevent imminent or life-threatening deterioration of the following conditions:  Sepsis   Critical care was time spent personally by me on the following activities:  Blood draw for specimens, development of treatment plan with patient or surrogate, discussions with consultants, evaluation of patient's response to treatment, examination of patient, interpretation of cardiac output measurements, obtaining history from patient or surrogate, ordering and performing treatments and interventions, ordering and review of laboratory studies, ordering and review of radiographic studies, pulse oximetry, re-evaluation of patient's condition and review of old charts   Care discussed with: admitting provider      Medications Ordered in the ED  heparin  injection 5,000 Units (has no administration in time range)  sodium chloride  flush (NS) 0.9 % injection 3 mL (has no administration in time range)  0.9 %  sodium chloride  infusion (has no administration in time range)   acetaminophen  (TYLENOL ) tablet 650 mg (has no administration in time range)    Or  acetaminophen  (TYLENOL ) suppository 650 mg (has no administration in time range)  HYDROcodone -acetaminophen  (NORCO/VICODIN) 5-325 MG per tablet 1 tablet (has no administration in time range)  morphine  (PF) 2 MG/ML injection 2 mg (has no administration in time range)  metroNIDAZOLE  (FLAGYL ) IVPB 500 mg (has no administration in time range)  ceFEPIme  (MAXIPIME ) 2 g in sodium chloride  0.9 % 100 mL IVPB (has no administration in time range)  vancomycin  (VANCOREADY) IVPB 1250 mg/250 mL (has no administration in time range)  sodium chloride  0.9 % bolus 500 mL (0 mLs Intravenous Stopped 09/12/24 1155)  sodium chloride  0.9 % bolus 500 mL (0 mLs Intravenous Stopped 09/12/24 1344)  vancomycin  (VANCOREADY) IVPB 1500 mg/300 mL (0 mg Intravenous Stopped 09/12/24 1557)  ceFEPIme  (MAXIPIME ) 2 g in sodium chloride  0.9 % 100 mL IVPB (0 g Intravenous Stopped 09/12/24 1320)  metroNIDAZOLE  (FLAGYL ) IVPB 500 mg (0 mg Intravenous Stopped 09/12/24 1350)  potassium chloride  SA (KLOR-CON  M) CR tablet 40 mEq (40 mEq Oral Given 09/12/24 1455)    Clinical Course as of 09/12/24 1713  Mon Sep 12, 2024  1200 Spoke with [facility] radiology about CXR read delay [CH]  1400 Spoke with Dr. Tobie [CH]  1429 Spoke with April, cards will see inpatient  [CH]  1605 -admit  [SE]    Clinical Course User Index [CH] Keundra Petrucelli F, PA-C [SE] Guillermina Hamilton, MD                                 Medical Decision Making Amount and/or Complexity of Data Reviewed Labs: ordered. Radiology: ordered.  Risk Prescription drug management. Decision regarding hospitalization.   Patient presents to the ED for concern of fall, weakness, cough, this involves an extensive number of treatment options, and is a complaint that carries with it a high risk of complications and morbidity.  The differential diagnosis includes sepsis, mechanical fall,  syncope, seizure, dehydration, cardiac reason for syncope/weakness, pneumonia, etc.   Co morbidities that complicate the patient evaluation  myasthenia gravis, iron  deficiency anemia, Bell's palsy, atrial fibrillation not on anticoagulation   Additional history obtained:  Additional history reviewed from recent admission on 09/06/2024 where patient was admitted for atrial fibrillation, patient is not on anticoagulation at this time   Lab Tests:  I Ordered, and personally interpreted labs.  The pertinent results include: CBC shows white blood cell count of 18, hemoglobin 13.6, sodium 133, potassium 3.5, chloride 97, glucose 103, urinalysis not consistent with infection, blood cultures pending, MRSA pending, troponin 24, respiratory panel negative, lactic acid 2.8 and 1.4   Imaging Studies ordered:  I ordered imaging studies including CT head, CT cervical spine, chest x-ray, pelvic x-ray I independently visualized and interpreted imaging which showed CT head shows no acute abnormality, CT cervical spine shows no musculoskeletal acute injury however does show concern for possible infectious process in the lungs, pelvic x-ray shows no acute abnormality, chest x-ray shows possible infectious pathology I agree with the radiologist interpretation   Cardiac Monitoring:  The patient was maintained on a cardiac monitor.  I personally viewed and interpreted the cardiac monitored which showed an underlying rhythm of: Sinus rhythm   Medicines ordered and prescription drug management:  I ordered medication including fluids for dehydration/developing sepsis, cefepime  and vancomycin  as well as Flagyl  Reevaluation of the patient after these medicines showed that the patient stayed the same I have reviewed the patients home medicines and have made adjustments as needed   Test Considered:  none   Critical Interventions:  Developing sepsis   Consultations Obtained:  I requested  consultation with the pharmacist and discussed  lab and imaging findings as well as pertinent plan - they recommend: ordered antibiotics for pneumonia coverage, also spoke with hospitalist for admission   Problem List / ED Course:  89 year old male, vital signs significant for blood pressure 105/52, presents to the emergency department via EMS for chief complaint of chills, chest pain, as well as fall, wife states that patient has developed cough overnight On physical exam patient is well-appearing however is on 2 L of nasal cannula oxygen, is oriented and talking in full sentences, mild tenderness to palpation of the left hip however no evidence of other orthopedic injuries other than redness to left forehead above eyebrow, patient is unsure if he hit his head or neck during the fall as he does not remember it, denies blood thinning medication Patient has an extensive past medical history including severe aortic valve stenosis with a recent heart cath Will obtain basic infectious labs, EKG, troponin for chest pain, will also obtain scans of head, neck, x-rays of chest, pelvis Due to mildly decreased blood pressure will supplement with IV fluids, no known source of infection at this time Labs significant for white blood cell count of 18, chest x-ray shows possible pneumonia, this is confirmed by CT cervical spine, initial lactic acid was elevated but is downtrending, will give IV antibiotics as well as further fluid resuscitation and plan to admit to medicine, patient has remained stable on 2 L of [place] O2 Current diagnosis at this time is developing sepsis in the setting of pneumonia, it is likely that pneumonia led to the weakness for this fall to occur versus exacerbation of severe aortic stenosis Did speak with April with cardiology who states they will see once inpatient Spoke with Dr. Tobie who agrees with patient admission  Patient admitted and covered with broad spectrum  antibiotics   Reevaluation:  After the interventions noted above, I reevaluated the patient and found that they have :stayed the same   Social Determinants of Health:  None   Dispostion:  After consideration of the diagnostic results and the patients response to treatment, I feel that the patient would benefit from admission to the hospital for ongoing diagnosis and treatment.       Final diagnoses:  Pneumonia of left lung due to infectious organism, unspecified part of lung  Syncope, unspecified syncope type    ED Discharge Orders     None          Janetta Terrall FALCON, [place] 09/12/24 1715  "

## 2024-09-12 NOTE — ED Notes (Signed)
 Pt to MRI

## 2024-09-12 NOTE — Consult Note (Addendum)
 "  Cardiology Consultation  Patient ID: Victor Sutton MRN: [id number]; DOB: 1927/12/12  Admit date: 09/12/2024 Date of Consult: 09/12/2024  PCP:  Victor Sutton., MD   [facility] Providers Cardiologist:  Victor LITTIE Nanas, MD     Patient Profile: Victor Sutton is a 89 y.o. 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 Tobie.  History of Present Illness: Victor Sutton has past medical history as stated above. He presented to [facility] ED on 09/12/2024 via EMS with concerns for sepsis. He reported chills, mild chest pain, fall. He began feeling poorly around 12 AM 12/29. He then gets up to attempt to use the bathroom, felt weak, and fell. He reports ending up stuck between the shower and the toilet. He does not have good memory of the fall. His wife reported that it took two EMS personnel to get him dislodged. EMS reports SBP of 60 upon arrival.   Vitals upon arrival to the ER: BP 105/52, HR 92, SpO2 92% (up to 98%)  Relevant workup since arrival to the ER: respiratory panel negative, CMET showed mild hyponatremia, hypokalemia, CBC showed leukocytosis at 18k with elevated neutrophils, initial troponin 24, pending blood cultures, lactic acid 2.8 ? 1.4. CXR showed infection/inflammation, pelvis XR normal, CT head showed no acute findings, some small vessel ischemic disease, CT C-spine showed no fracture, possible left upper lobe pulmonary opacities.   He is a patient of Victor Sutton but has seen [facility] Sutton as an outpatient in the setting of severe AS. He was seen by [facility] 08/26/2024. At this appointment he discussed how limiting his symptoms were. He reported shortness of breath with mild activity, extreme fatigue and some chest  discomfort. At this time he declined and chest pain, lightheadedness, syncope. They discussed various options but decided to proceed with workup for TAVR.   He underwent R/LHC on 09/06/2024. This revealed mild nonobstructive CAD, severe AS, fairly normal filling pressures and recommendation was to continue proceeding with TAVR workup. He was scheduled to get his pre-TAVR scans completed 10/04/2024.   He was admitted to the medicine service. Given IV fluids and started on IV antibiotics. Cardiology was consulted in the setting of severe AS.   After speaking with the patient, he agrees that he has been feeling fairly poorly for a few days. He has been more tired lately and has been experiencing chills for the last two days. He has been feeling slightly better since having some IV fluids.   Past Medical History:  Diagnosis Date   Adenomatous colon polyp    B12 deficiency    HIstory   Basal cell carcinoma 08/18/2006   BASOSQUAMOUS LEFT OUTER FOREHEAD TX CX3 , EXC   BPH (benign prostatic hyperplasia)    Ole ulcer    Cancer [facility])    precancerous skin lesions on leg    Cataract 2009   bilateral cataract surgery   Delayed gastric emptying    Disturbance of salivary secretion 06/20/2013   Dysphagia, pharyngoesophageal phase 03/16/2013   Dysphonia 03/16/2013   Gait abnormality 11/29/2019   GERD (gastroesophageal reflux disease)    Hiatal hernia    hiatial hernia repair  02/08/13   History of blood transfusion    Hyperlipidemia    not on medication  Hypertension    Iron  deficiency anemia    Leukoplakia 2001   Treated for   Macular degeneration    (L) wet, (R) dry   Myasthenia gravis (HCC) 03/21/2013   Peripheral neuropathy    Mild History of   Polyneuritis 1965   Resolved   SCC (squamous cell carcinoma) 03/12/2011   SCC LEFT INNER CHEEK CX3 5FU   SCC (squamous cell carcinoma) 08/24/2012   RIGHT LOWER SHIN TX WITH BX MOHS   SCC (squamous cell carcinoma) 07/26/2014   LEFT UNDER  CHEEK CX3 5FU   SCC (squamous cell carcinoma) 03/12/2021   well diff- right temple (CX35FU)   Squamous cell carcinoma of skin 05/03/2008   SCC INSITU RIGHT V OF CHEST TX EXC   Ulcer    Vitamin D deficiency    Past Surgical History:  Procedure Laterality Date   Arthroscopic knee surgery     left   cateract surgeries      bilateral   CHOLECYSTECTOMY     COLONOSCOPY WITH PROPOFOL  N/A 02/16/2017   Procedure: COLONOSCOPY WITH PROPOFOL ;  Surgeon: Victor Gladis POUR, MD;  Location: [facility];  Service: Endoscopy;  Laterality: N/A;   GASTROSTOMY W/ FEEDING TUBE  [id number]   HERNIA REPAIR     Laparoscopic ventral   HIATAL HERNIA REPAIR  2004   HIATAL HERNIA REPAIR N/A 02/08/2013   Procedure:  REPAIR OFCURRENT HIATAL HERNIA, ;  Surgeon: Victor Sutton Russell, MD;  Location: [facility]S;  Service: General;  Laterality: N/A;   LAPAROSCOPIC LYSIS OF ADHESIONS N/A 02/08/2013   Procedure: LAPAROSCOPIC LYSIS OF ADHESIONS;  Surgeon: Victor Sutton Russell, MD;  Location: [facility];  Service: General;  Laterality: N/A;   LAPAROSCOPIC NISSEN FUNDOPLICATION  02/08/2013   Procedure: LAPAROSCOPIC NISSEN FUNDOPLICATION;  Surgeon: Victor Sutton Russell, MD;  Location: [facility];  Service: General;;   Multiple oral surgeries  2002   RIGHT/LEFT HEART CATH AND CORONARY ANGIOGRAPHY N/A 09/06/2024   Procedure: RIGHT/LEFT HEART CATH AND CORONARY ANGIOGRAPHY;  Surgeon: Victor Newman JINNY, MD;  Location: [facility];  Service: Cardiovascular;  Laterality: N/A;   SKIN CANCER EXCISION  10/2012   right leg-shin area   STOMACH SURGERY  2005   states his stomach had moved up toward his chest , some type of surgery performed   TONSILLECTOMY     UPPER GI ENDOSCOPY N/A 02/08/2013   Procedure: UPPER GI ENDOSCOPY;  Surgeon: Victor Sutton Russell, MD;  Location: [facility];  Service: General;  Laterality: N/A;    Home Medications:  Prior to Admission medications  Medication Sig Start Date End Date Taking? Authorizing Provider  Apoaequorin  (PREVAGEN) 10 MG CAPS Take 1 Capful by mouth in the morning.   Yes [provider]  ascorbic acid (VITAMIN C) 250 MG tablet Take 250 mg by mouth 2 (two) times daily. Am + dinner   Yes [provider]  CALCIUM PO Take 1 tablet by mouth daily.   Yes [provider]  chlorhexidine  (PERIDEX ) 0.12 % solution Use as directed 5 mLs in the mouth or throat 2 (two) times daily. 08/24/23  Yes [provider]  denosumab  (PROLIA ) 60 MG/ML SOSY injection Inject 60 mg into the skin every 6 (six) months. 04/02/23  Yes [provider]  hydrochlorothiazide  (HYDRODIURIL ) 25 MG tablet Take 1 tablet (25 mg total) by mouth daily. 04/07/16  Yes Jenel Carlin POUR, MD  Lutein 40 MG CAPS Take 40 mg by mouth daily.   Yes [provider]  Multiple  Vitamins-Minerals (ICAPS) CAPS Take 1 capsule by mouth in the morning and at bedtime. Preservision: Take in 1 capsule in morning and 1 in evening at dinner time.   Yes [provider]  pantoprazole  (PROTONIX ) 40 MG tablet Take 40 mg by mouth daily. May take an additional 40mg s as needed for heartburn 09/27/13  Yes [provider]  Polyethyl Glycol-Propyl Glycol (SYSTANE ULTRA) 0.4-0.3 % SOLN Apply 1-2 drops to eye daily as needed (dry eye).   Yes [provider]  Polyethyl Glycol-Propyl Glycol (SYSTANE) 0.4-0.3 % GEL ophthalmic gel Place 1 Application into both eyes at bedtime as needed (dry eye).   Yes [provider]  potassium chloride  (KLOR-CON ) 10 MEQ tablet TAKE 1 TABLET BY MOUTH ONCE DAILY 03/14/21  Yes Jenel Carlin POUR, MD  Probiotic Product (ALIGN) 4 MG CAPS Take 4 mg by mouth daily. 10/16/17  Yes [provider]  pyridostigmine  (MESTINON ) 60 MG tablet Take 0.5 tablets (30 mg total) by mouth 2 (two) times daily as needed. 08/29/24  Yes Gayland Lauraine PARAS, NP  tamsulosin (FLOMAX) 0.4 MG CAPS capsule Take 0.4 mg by mouth at bedtime. 10/24/17  Yes [provider]  UNABLE TO FIND Take 1  tablet by mouth daily. Med Name:EyePromise  Zeaxanthin 10mg  and lutein 10mg    Yes [provider]  Vitamin D, Ergocalciferol, (DRISDOL) 50000 UNITS CAPS capsule Take 50,000 Units by mouth every 7 (seven) days. ON FRIDAYS   Yes [provider]   Scheduled Meds:  Continuous Infusions:  vancomycin  1,500 mg (09/12/24 1357)   PRN Meds:  Allergies:   Allergies[1]  Social History:   Social History   Socioeconomic History   Marital status: Married    Spouse name: Marcos   Number of children: 3   Years of education: 16   Highest education level: Not on file  Occupational History   Occupation: Retired    Associate Professor: RETIRED  Tobacco Use   Smoking status: Former    Current packs/day: 0.00    Types: Cigarettes    Quit date: 09/15/1976    Years since quitting: 48.0   Smokeless tobacco: Never   Tobacco comments:    Quit 1980  Vaping Use   Vaping status: Never Used  Substance and Sexual Activity   Alcohol  use: No    Comment: Moderate alcohol , wine   Drug use: No   Sexual activity: Not on file  Other Topics Concern   Not on file  Social History Narrative   Lives at home, married   Patient is right-handed.   Patient drinks caffeine occasionally.      Social Drivers of Health   Tobacco Use: Medium Risk (09/12/2024)   Patient History    Smoking Tobacco Use: Former    Smokeless Tobacco Use: Never    Passive Exposure: Not on Actuary Strain: Not on file  Food Insecurity: Not on file  Transportation Needs: Not on file  Physical Activity: Not on file  Stress: Not on file  Social Connections: Not on file  Intimate Partner Violence: Not on file  Depression (EYV7-0): Not on file  Alcohol  Screen: Not on file  Housing: Not on file  Utilities: Not on file  Health Literacy: Not on file    Family History:   Family History  Problem Relation Age of Onset   Diabetes Father    Heart attack Father    Breast cancer Sister    CAD Sister    COPD  Sister  Prostate cancer Paternal Grandfather    Colon cancer Son 44   Myasthenia gravis Son    Colon cancer Son 66    ROS:  Please see the history of present illness.  All other ROS reviewed and negative.     Physical Exam/Data: Vitals:   09/12/24 0832 09/12/24 1133 09/12/24 1352 09/12/24 1436  BP: (!) 105/52 (!) 95/51 (!) 106/57   Pulse: 92 71 81   Resp: 18 15 15    Temp: 98 F (36.7 C)   98.4 F (36.9 C)  TempSrc: Oral   Oral  SpO2: 90% 100% 98%   Weight:      Height:       No intake or output data in the 24 hours ending 09/12/24 1549    09/12/2024    8:30 AM 09/06/2024    8:34 AM 08/26/2024    2:49 PM  Last 3 Weights  Weight (lbs) 146 lb 146 lb 146 lb 3.2 oz  Weight (kg) 66.225 kg 66.225 kg 66.316 kg     Body mass index is 21.56 kg/m.   General:  in no acute distress, currently on oxygen via [place] HEENT: normal Neck: no JVD Vascular: Distal pulses 2+ bilaterally Cardiac:  soft SEM best heard at RUSB Lungs:  clear to auscultation bilaterally Abd: soft, nontender, no hepatomegaly  Ext: no edema Musculoskeletal:  No deformities, BUE and BLE strength normal and equal Skin: warm and dry  Neuro:  no focal abnormalities noted Psych:  Normal affect   EKG:  The EKG was personally reviewed and demonstrates:  sinus rhythm, HR 69  Relevant CV Studies:  R/LHC, 09/06/2024 LM: Proximal 20% stenosis LAD: Severe proximal calcification but no significant luminal stenosis Lcx: Severe proximal calcification but no significant luminal stenosis RCA: Dominant vessel. Severe proximal calcification but no significant luminal stenosis LVEDP 18 mmHg   Right heart catheterization 09/06/2024: RA: 7 mmHg RV: 42/0 mmHg PA: 35/14 mmHg, mPAP 23 mmHg PCW: 11 mmHg   AO sats: 95% PA sats: 76%   CO: 7.1 L/min CI: 3.9 L/min/m2   Aortic valve hemodynamics (simultaneous LV-Ao measurement using Langston catheter): Mean PG 42 mmHg AVA 1.07 cm2 AVAi 0.59 cm/m2   Conclusion: Mild  nonobstructive coronary artery disease Severe aortic stenosis Fairly normal filling pressures   Recommendation: Continue TAVR workup  Echocardiogram, 08/15/2024 Left ventricular ejection fraction, by estimation, is 60 to 65% . The left ventricle has normal function. Left ventricular diastolic parameters were normal.  Right ventricular systolic function is normal. The right ventricular size is normal.  The mitral valve is grossly normal. No evidence of mitral valve regurgitation. There is evidence of severe paradoxical low flow low gradient AS with PV 3. 0 m/ sec, MG 18 mmHg, AVA 0. 9 cm^ 2, DVI 0. 22 and SVI 30. No evidence of aortic regurgitation but limited evaluation due to quality of study. . The aortic valve was not well visualized. Aortic valve regurgitation is not visualized. Severe aortic valve stenosis.  The inferior vena cava is normal in size with < 50% respiratory variability, suggesting right atrial pressure of 8 mmHg.  Comparison( s) : No changes compared to prior echo with no change in EF, gradient, AVA, DVI, or SVI.  Laboratory Data: High Sensitivity Troponin:  No results for input(s): TROPONINIHS in the last 720 hours.  Recent Labs  Lab 09/12/24 0909  TRNPT 24*      Chemistry Recent Labs  Lab 09/06/24 0956 09/06/24 1001 09/12/24 0909  NA 133*  131* 131*  131* 133*  K 3.4*  3.6 3.6  3.6 3.3*  CL  --   --  97*  CO2  --   --  26  GLUCOSE  --   --  103*  BUN  --   --  12  CREATININE  --   --  0.72  CALCIUM  --   --  9.1  GFRNONAA  --   --  >60  ANIONGAP  --   --  11    Recent Labs  Lab 09/12/24 0909  PROT 6.3*  ALBUMIN 3.6  AST 28  ALT 12  ALKPHOS 42  BILITOT 1.4*   Lipids No results for input(s): CHOL, TRIG, HDL, LABVLDL, LDLCALC, CHOLHDL in the last 168 hours.  Hematology Recent Labs  Lab 09/06/24 0956 09/06/24 1001 09/12/24 0909  WBC  --   --  18.0*  RBC  --   --  4.16*  HGB 12.2*  12.6* 12.2*  12.9* 13.6  HCT 36.0*   37.0* 36.0*  38.0* 39.4  MCV  --   --  94.7  MCH  --   --  32.7  MCHC  --   --  34.5  RDW  --   --  12.7  PLT  --   --  202   Thyroid  No results for input(s): TSH, FREET4 in the last 168 hours.  BNPNo results for input(s): BNP, PROBNP in the last 168 hours.  DDimer No results for input(s): DDIMER in the last 168 hours.  Radiology/Studies:  DG Chest Port 1 View Result Date: 09/12/2024 EXAM: 1 VIEW(S) XRAY OF THE CHEST 09/12/2024 09:20:00 AM COMPARISON: Chest x-ray 06/05/2018 CLINICAL HISTORY: Questionable sepsis - evaluate for abnormality FINDINGS: LUNGS AND PLEURA: Low lung volumes with bronchovascular crowding. Elevated right hemidiaphragm. Left basilar patchy opacities. No pleural effusion. No pneumothorax. HEART AND MEDIASTINUM: No acute abnormality of the cardiac and mediastinal silhouettes. BONES AND SOFT TISSUES: No acute osseous abnormality. IMPRESSION: 1. Low lung volumes with left basilar patchy opacities. Findings may represent infectious/inflammatory process versus atelectasis. Recommend repeat PA and lateral view of the chest for further evaluation. Electronically signed by: Morgane Naveau MD 09/12/2024 01:00 PM EST RP Workstation: [id number]   DG Pelvis Portable Result Date: 09/12/2024 EXAM: 1 or 2 VIEW(S) XRAY OF THE PELVIS 09/12/2024 09:20:00 AM COMPARISON: None available. CLINICAL HISTORY: fall, left hip pain FINDINGS: BONES AND JOINTS: No acute fracture. No malalignment. Degenerative changes of the lower lumbar spine. SOFT TISSUES: Hernia repair tacks partially visualized overlying the right mid abdomen. Vascular calcifications. IMPRESSION: 1. No acute findings. Electronically signed by: Morgane Naveau MD 09/12/2024 11:34 AM EST RP Workstation: [id number]   CT Cervical Spine Wo Contrast Result Date: 09/12/2024 EXAM: CT CERVICAL SPINE WITHOUT CONTRAST 09/12/2024 09:27:34 AM TECHNIQUE: CT of the cervical spine was performed without the administration of intravenous  contrast. Multiplanar reformatted images are provided for review. Automated exposure control, iterative reconstruction, and/or weight based adjustment of the mA/kV was utilized to reduce the radiation dose to as low as reasonably achievable. COMPARISON: MRI cervical spine 11/08/2021. CLINICAL HISTORY: mechanical fall. FINDINGS: BONES AND ALIGNMENT: No acute fracture or traumatic malalignment. DEGENERATIVE CHANGES: Overall mild multilevel disc degeneration for age. Moderate multilevel facet arthrosis. Mild spinal stenosis at C3-C4. SOFT TISSUES: No prevertebral soft tissue swelling. Partially visualized patchy left upper lobe pulmonary opacities. IMPRESSION: 1. No acute cervical spine fracture or traumatic malalignment. 2. Partially visualized patchy left upper lobe pulmonary opacities, which could be infectious or inflammatory; correlate with pending  chest radiograph. Electronically signed by: Dasie Hamburg MD 09/12/2024 10:02 AM EST RP Workstation: [id number]   CT Head Wo Contrast Result Date: 09/12/2024 EXAM: CT HEAD WITHOUT CONTRAST 09/12/2024 09:27:34 AM TECHNIQUE: CT of the head was performed without the administration of intravenous contrast. Automated exposure control, iterative reconstruction, and/or weight based adjustment of the mA/kV was utilized to reduce the radiation dose to as low as reasonably achievable. COMPARISON: Head CT 01/01/2013 CLINICAL HISTORY: syncopal episode, fall FINDINGS: BRAIN AND VENTRICLES: There is no evidence of an acute infarct, intracranial hemorrhage, mass, midline shift, hydrocephalus, or extra-axial fluid collection. Mild cerebral atrophy is within normal limits for age. Cerebral white matter hypodensities are nonspecific but compatible with mild chronic small vessel ischemic disease. Calcified atherosclerosis at the skull base. ORBITS: Bilateral cataract extraction. SINUSES: Scattered mild, partially polypoid mucosal thickening in the paranasal sinuses. Clear mastoid air  cells. SOFT TISSUES AND SKULL: No acute soft tissue abnormality. No skull fracture. IMPRESSION: 1. No acute intracranial abnormality. 2. Mild chronic small vessel ischemic disease. Electronically signed by: Dasie Hamburg MD 09/12/2024 09:57 AM EST RP Workstation: [id number]   Assessment and Plan:  Nonrheumatic severe paradoxical low-flow low gradient aortic stenosis  Known history of severe AS Echo 08/2024: PV 3.0 m/ sec, MG 18 mmHg, AVA 0.9 cm2, DVI 0.22 and SVI 30 Seen by Victor Sutton as an outpatient, plans for TAVR  [facility] 08/2024: mild nonobstructive CAD, severe AS, fairly normal filling pressures  Pre-TAVR scans are scheduled Jan. 2026  Will follow blood culture results Continue with TAVR workup as previously planned  Hypertension  Home meds: hydrochlorothiazide  25 mg daily  SBP reported to be in the 60s when EMS arrived SBP 90-100s  Currently being treated with IV fluids for sepsis  Hold off on home antihypertensives while treating for sepsis/PNA  Remote history atrial flutter  Single episode of A-flutter, post-op following hernia surgery in 2014 No evidence of recurrence  Zio patch 12/2020 showed frequent PACs, occasional PVCs, some SVT episodes Previously recommended Kardia mobile device for further monitoring   Per primary Syncope Sepsis Possible PNA Electrolyte disturbances Lactic acidosis  Myasthenia gravis  GERD  BPH  Risk Assessment/Risk Scores:           For questions or updates, please contact [facility] Please consult [email or web address] for contact info under   Signed, Waddell DELENA Donath, PA-C  09/12/2024 3:49 PM   ATTENDING ATTESTATION:  After conducting a review of all available clinical information with the care team, interviewing the patient, and performing a physical exam, I agree with the findings and plan described in this note.   GEN: No acute distress, AO x 3 HEENT:  MMM, no JVD, no scleral icterus Cardiac: RRR, 2 out of 6 systolic  murmur rubs, or gallops.  Respiratory: Coarse GI: Soft, nontender, non-distended  MS: No edema; No deformity. Neuro:  Nonfocal  Vasc:  +2 radial pulses  The patient is a pleasant 89 year old male with a history of paradoxical low-flow low gradient aortic stenosis undergoing evaluation for transcatheter valve replacement, GERD, myasthenia gravis, and hyperlipidemia who presents today with malaise, chills, and elevated white count.  The patient was in his normal state of health up until last night.  He had recently undergone coronary angiography and right heart catheterization without issues.  Perhaps more notably he had some dental work done recently.  Yesterday he developed chills and malaise.  He called cardiology this morning.  He was instructed to consult with his PCP.  He ultimately ended  up in the emergency department after he felt so weak when he tried to walk.  EMS was called.  Apparently his blood pressure on arrival was in the 60s.  Currently he is hemodynamically stable.  His evaluation in the emergency department demonstrated an elevated white count with a leftward shift.  His chest x-ray demonstrated a possible right lower lobe infiltrate.  He was started on antibiotics.  His lactate was initially elevated but has now decreased to 1.4 with judicious IV fluids.  Given his elevated white count and chills this likely represents an infectious etiology.  Calcitonin is pending.  Troponin is mildly elevated.  While the patient does have paradoxical low-flow low gradient aortic stenosis, I do not think this is driving his current symptom complex.  Certainly if his blood cultures are positive we will need to pursue an evaluation for endocarditis.  Will obtain an echocardiogram.  He has no overt signs or symptoms of endocarditis.  We will follow with you.  Kaileia Flow, MD Pager [phone]      [1]  Allergies Allergen Reactions   Ciprofloxacin  Anaphylaxis    Avoid due to myasthenia  gravis  Other Reaction(s): Not to take with MS   Quinolones Anaphylaxis    Avoid due to myasthenia gravis   "

## 2024-09-12 NOTE — H&P (Signed)
 " History and Physical    Patient: Victor Sutton FMW:[id number] DOB: 10-14-1927 DOA: 09/12/2024 DOS: the patient was seen and examined on 09/12/2024 . PCP: [name], MD  Patient coming from: Home Chief complaint: Chief Complaint  Patient presents with   Code Sepsis   HPI:  Victor Sutton is a 89 y.o. male with past medical history  of severe positive myasthenia gravis diagnosed in 2014 last seen by neurology in September 2025 currently on CellCept  to 50 mg daily, lumbar stenosis, B12 deficiency, GERD, hyperlipidemia, essential hypertension, history of atrial flutter with a CHA2DS2-VASc score of 3 is currently not on anticoagulation, severe aortic stenosis, brought from home today for shortness of breath, dizziness noted when he was getting up to use the bathroom overnight, BP per EMS was systolic of 60's. Per RN report: Patient got up to go to the bathroom this morning, felt dizzy, and had a semi fall where he didn't fall completely to the ground, and got stuck between the shower and the toilet. Patient was diaphoretic and pale upon EMS arrival with a pressure of 60.  Patient was recently admitted by cardiology on 09/06/2024 patient underwent a right and left heart cath and coronary angiogram which showed mild nonobstructive CAD with severe aortic stenosis and continue with TAVR workup recommendation.  ED Course:  Vital signs in the ED were notable for the following:  Vitals:   09/12/24 1352 09/12/24 1436 09/12/24 1600 09/12/24 1754  BP: (!) 106/57  (!) 108/55 (!) 108/52  Pulse: 81  79 73  Temp:  98.4 F (36.9 C)  98.3 F (36.8 C)  Resp: 15  19 20   Height:      Weight:      SpO2: 98%  98% 100%  TempSrc:  Oral  Oral  BMI (Calculated):       >>ED evaluation thus far shows: CMP shows potassium 3.3 sodium 133 chloride 97 bicarb 26 glucose 103 creatinine normal LFTs within normal limits. Troponin of 24. Lactic of 2.8. Repeat lactic of 1.4 after IV fluid  hydration. CBC shows a white count of 18.0 hemoglobin 13.6 and platelets of 202. INR 1.1.  EKG shows A-fib at 94 LVH QTc 442. Urinalysis is bland he has a urine amber-colored 30 of protein 0-5 RBCs 0-5 WBCs. Chest x-ray done today shows left basilar patchy opacity may be infectious inflammatory or atelectasis. Pelvis x-ray is negative for any acute findings.   >>While in the ED patient received the following: Medications  vancomycin  (VANCOREADY) IVPB 1500 mg/300 mL (1,500 mg Intravenous New Bag/Given 09/12/24 1357)  sodium chloride  0.9 % bolus 500 mL (0 mLs Intravenous Stopped 09/12/24 1155)  sodium chloride  0.9 % bolus 500 mL (0 mLs Intravenous Stopped 09/12/24 1344)  ceFEPIme  (MAXIPIME ) 2 g in sodium chloride  0.9 % 100 mL IVPB (0 g Intravenous Stopped 09/12/24 1320)  metroNIDAZOLE  (FLAGYL ) IVPB 500 mg (0 mg Intravenous Stopped 09/12/24 1350)   Review of Systems  Constitutional:  Positive for chills, diaphoresis and malaise/fatigue.  Neurological:  Positive for dizziness and weakness.   Past Medical History:  Diagnosis Date   Adenomatous colon polyp    B12 deficiency    HIstory   Basal cell carcinoma 08/18/2006   BASOSQUAMOUS LEFT OUTER FOREHEAD TX CX3 , EXC   BPH (benign prostatic hyperplasia)    Ole ulcer    Cancer [facility])    precancerous skin lesions on leg    Cataract 2009   bilateral cataract surgery   Delayed gastric  emptying    Disturbance of salivary secretion 06/20/2013   Dysphagia, pharyngoesophageal phase 03/16/2013   Dysphonia 03/16/2013   Gait abnormality 11/29/2019   GERD (gastroesophageal reflux disease)    Hiatal hernia    hiatial hernia repair  02/08/13   History of blood transfusion    Hyperlipidemia    not on medication   Hypertension    Iron  deficiency anemia    Leukoplakia 2001   Treated for   Macular degeneration    (L) wet, (R) dry   Myasthenia gravis (HCC) 03/21/2013   Peripheral neuropathy    Mild History of   Polyneuritis 1965    Resolved   SCC (squamous cell carcinoma) 03/12/2011   SCC LEFT INNER CHEEK CX3 5FU   SCC (squamous cell carcinoma) 08/24/2012   RIGHT LOWER SHIN TX WITH BX MOHS   SCC (squamous cell carcinoma) 07/26/2014   LEFT UNDER CHEEK CX3 5FU   SCC (squamous cell carcinoma) 03/12/2021   well diff- right temple (CX35FU)   Squamous cell carcinoma of skin 05/03/2008   SCC INSITU RIGHT V OF CHEST TX EXC   Ulcer    Vitamin D deficiency    Past Surgical History:  Procedure Laterality Date   Arthroscopic knee surgery     left   cateract surgeries      bilateral   CHOLECYSTECTOMY     COLONOSCOPY WITH PROPOFOL  N/A 02/16/2017   Procedure: COLONOSCOPY WITH PROPOFOL ;  Surgeon: Vicci Gladis POUR, MD;  Location: [facility];  Service: Endoscopy;  Laterality: N/A;   GASTROSTOMY W/ FEEDING TUBE  [id number]   HERNIA REPAIR     Laparoscopic ventral   HIATAL HERNIA REPAIR  2004   HIATAL HERNIA REPAIR N/A 02/08/2013   Procedure:  REPAIR OFCURRENT HIATAL HERNIA, ;  Surgeon: Krystal JINNY Russell, MD;  Location: [facility] ORS;  Service: General;  Laterality: N/A;   LAPAROSCOPIC LYSIS OF ADHESIONS N/A 02/08/2013   Procedure: LAPAROSCOPIC LYSIS OF ADHESIONS;  Surgeon: Krystal JINNY Russell, MD;  Location: [facility] ORS;  Service: General;  Laterality: N/A;   LAPAROSCOPIC NISSEN FUNDOPLICATION  02/08/2013   Procedure: LAPAROSCOPIC NISSEN FUNDOPLICATION;  Surgeon: Krystal JINNY Russell, MD;  Location: [facility] OR[facility];  Service: General;;   Multiple oral surgeries  2002   RIGHT/LEFT HEART CATH AND CORONARY ANGIOGRAPHY N/A 09/06/2024   Procedure: RIGHT/LEFT HEART CATH AND CORONARY ANGIOGRAPHY;  Surgeon: Elmira Newman JINNY, MD;  Location: [facility];  Service: Cardiovascular;  Laterality: N/A;   SKIN CANCER EXCISION  10/2012   right leg-shin area   STOMACH SURGERY  2005   states his stomach had moved up toward his chest , some type of surgery performed   TONSILLECTOMY     UPPER GI ENDOSCOPY N/A 02/08/2013   Procedure: UPPER GI ENDOSCOPY;   Surgeon: Krystal JINNY Russell, MD;  Location: [facility] ORS;  Service: General;  Laterality: N/A;    reports that he quit smoking about 48 years ago. His smoking use included cigarettes. He has never used smokeless tobacco. He reports that he does not drink alcohol  and does not use drugs. Allergies[1] Family History  Problem Relation Age of Onset   Diabetes Father    Heart attack Father    Breast cancer Sister    CAD Sister    COPD Sister    Prostate cancer Paternal Grandfather    Colon cancer Son 52   Myasthenia gravis Son    Colon cancer Son 41   Prior to Admission medications  Medication Sig Start Date End Date  Taking? Authorizing Provider  Apoaequorin (PREVAGEN) 10 MG CAPS Take 1 Capful by mouth in the morning.   Yes [provider]  ascorbic acid (VITAMIN C) 250 MG tablet Take 250 mg by mouth 2 (two) times daily. Am + dinner   Yes [provider]  CALCIUM PO Take 1 tablet by mouth daily.   Yes [provider]  chlorhexidine  (PERIDEX ) 0.12 % solution Use as directed 5 mLs in the mouth or throat 2 (two) times daily. 08/24/23  Yes [provider]  denosumab  (PROLIA ) 60 MG/ML SOSY injection Inject 60 mg into the skin every 6 (six) months. 04/02/23  Yes [provider]  hydrochlorothiazide  (HYDRODIURIL ) 25 MG tablet Take 1 tablet (25 mg total) by mouth daily. 04/07/16  Yes Jenel Carlin POUR, MD  Lutein 40 MG CAPS Take 40 mg by mouth daily.   Yes [provider]  Multiple Vitamins-Minerals (ICAPS) CAPS Take 1 capsule by mouth in the morning and at bedtime. Preservision: Take in 1 capsule in morning and 1 in evening at dinner time.   Yes [provider]  pantoprazole  (PROTONIX ) 40 MG tablet Take 40 mg by mouth daily. May take an additional 40mg s as needed for heartburn 09/27/13  Yes [provider]  Polyethyl Glycol-Propyl Glycol (SYSTANE ULTRA) 0.4-0.3 % SOLN Apply 1-2 drops to eye daily as needed (dry eye).   Yes [provider]  Polyethyl Glycol-Propyl Glycol (SYSTANE) 0.4-0.3 % GEL ophthalmic gel Place 1 Application into both eyes at bedtime as needed (dry eye).   Yes [provider]  potassium chloride  (KLOR-CON ) 10 MEQ tablet TAKE 1 TABLET BY MOUTH ONCE DAILY 03/14/21  Yes Jenel Carlin POUR, MD  Probiotic Product (ALIGN) 4 MG CAPS Take 4 mg by mouth daily. 10/16/17  Yes [provider]  pyridostigmine  (MESTINON ) 60 MG tablet Take 0.5 tablets (30 mg total) by mouth 2 (two) times daily as needed. 08/29/24  Yes Gayland Lauraine PARAS, NP  tamsulosin (FLOMAX) 0.4 MG CAPS capsule Take 0.4 mg by mouth at bedtime. 10/24/17  Yes [provider]  UNABLE TO FIND Take 1 tablet by mouth daily. Med Name:EyePromise  Zeaxanthin 10mg  and lutein 10mg    Yes [provider]  Vitamin D, Ergocalciferol, (DRISDOL) 50000 UNITS CAPS capsule Take 50,000 Units by mouth every 7 (seven) days. ON FRIDAYS   Yes [provider]                                                                                 Vitals:   09/12/24 1352 09/12/24 1436 09/12/24 1600 09/12/24 1754  BP: (!) 106/57  (!) 108/55 (!) 108/52  Pulse: 81  79 73  Resp: 15  19 20   Temp:  98.4 F (36.9 C)  98.3 F (36.8 C)  TempSrc:  Oral  Oral  SpO2: 98%  98% 100%  Weight:      Height:       Physical Exam Vitals reviewed.  Constitutional:      General: He is not in acute distress.    Appearance: He is not ill-appearing.  HENT:     Head: Normocephalic.  Eyes:     Extraocular Movements: Extraocular movements  intact.  Cardiovascular:     Rate and Rhythm: Normal rate and regular rhythm.     Heart sounds: Normal heart sounds.  Pulmonary:     Breath sounds: Normal breath sounds.  Abdominal:     General: There is no distension.     Palpations: Abdomen is soft.     Tenderness: There is no abdominal tenderness.  Neurological:     General: No focal deficit present.     Mental Status: He is alert and oriented to person,  place, and time.     Labs on Admission: I have personally reviewed following labs and imaging studies CBC: Recent Labs  Lab 09/06/24 0956 09/06/24 1001 09/12/24 0909  WBC  --   --  18.0*  NEUTROABS  --   --  16.6*  HGB 12.2*  12.6* 12.2*  12.9* 13.6  HCT 36.0*  37.0* 36.0*  38.0* 39.4  MCV  --   --  94.7  PLT  --   --  202   Basic Metabolic Panel: Recent Labs  Lab 09/06/24 0956 09/06/24 1001 09/12/24 0909  NA 133*  131* 131*  131* 133*  K 3.4*  3.6 3.6  3.6 3.3*  CL  --   --  97*  CO2  --   --  26  GLUCOSE  --   --  103*  BUN  --   --  12  CREATININE  --   --  0.72  CALCIUM  --   --  9.1   GFR: Estimated Creatinine Clearance: 52.9 mL/min (by C-G formula based on SCr of 0.72 mg/dL). Liver Function Tests: Recent Labs  Lab 09/12/24 0909  AST 28  ALT 12  ALKPHOS 42  BILITOT 1.4*  PROT 6.3*  ALBUMIN 3.6   No results for input(s): LIPASE, AMYLASE in the last 168 hours. No results for input(s): AMMONIA in the last 168 hours. Recent Labs    12/17/23 1430 04/12/24 1051 08/19/24 1240 09/12/24 0909  BUN 14 16 15 12   CREATININE 0.91 1.00 0.83 0.72    Cardiac Enzymes: No results for input(s): CKTOTAL, CKMB, CKMBINDEX, TROPONINI in the last 168 hours. BNP (last 3 results) No results for input(s): PROBNP in the last 8760 hours. HbA1C: No results for input(s): HGBA1C in the last 72 hours. CBG: No results for input(s): GLUCAP in the last 168 hours. Lipid Profile: No results for input(s): CHOL, HDL, LDLCALC, TRIG, CHOLHDL, LDLDIRECT in the last 72 hours. Thyroid  Function Tests: No results for input(s): TSH, T4TOTAL, FREET4, T3FREE, THYROIDAB in the last 72 hours. Anemia Panel: No results for input(s): VITAMINB12, FOLATE, FERRITIN, TIBC, IRON , RETICCTPCT in the last 72 hours. Urine analysis:    Component Value Date/Time   COLORURINE AMBER (A) 09/12/2024 1236   APPEARANCEUR HAZY (A) 09/12/2024 1236    LABSPEC 1.019 09/12/2024 1236   PHURINE 6.0 09/12/2024 1236   GLUCOSEU NEGATIVE 09/12/2024 1236   HGBUR NEGATIVE 09/12/2024 1236   BILIRUBINUR NEGATIVE 09/12/2024 1236   KETONESUR 5 (A) 09/12/2024 1236   PROTEINUR 30 (A) 09/12/2024 1236   UROBILINOGEN 1.0 02/11/2014 1019   NITRITE NEGATIVE 09/12/2024 1236   LEUKOCYTESUR NEGATIVE 09/12/2024 1236   Radiological Exams on Admission: DG Chest Port 1 View Result Date: 09/12/2024 EXAM: 1 VIEW(S) XRAY OF THE CHEST 09/12/2024 09:20:00 AM COMPARISON: Chest x-ray 06/05/2018 CLINICAL HISTORY: Questionable sepsis - evaluate for abnormality FINDINGS: LUNGS AND PLEURA: Low lung volumes with bronchovascular crowding. Elevated right hemidiaphragm. Left basilar patchy opacities. No pleural effusion. No pneumothorax.  HEART AND MEDIASTINUM: No acute abnormality of the cardiac and mediastinal silhouettes. BONES AND SOFT TISSUES: No acute osseous abnormality. IMPRESSION: 1. Low lung volumes with left basilar patchy opacities. Findings may represent infectious/inflammatory process versus atelectasis. Recommend repeat PA and lateral view of the chest for further evaluation. Electronically signed by: Morgane Naveau MD 09/12/2024 01:00 PM EST RP Workstation: [id number]   DG Pelvis Portable Result Date: 09/12/2024 EXAM: 1 or 2 VIEW(S) XRAY OF THE PELVIS 09/12/2024 09:20:00 AM COMPARISON: None available. CLINICAL HISTORY: fall, left hip pain FINDINGS: BONES AND JOINTS: No acute fracture. No malalignment. Degenerative changes of the lower lumbar spine. SOFT TISSUES: Hernia repair tacks partially visualized overlying the right mid abdomen. Vascular calcifications. IMPRESSION: 1. No acute findings. Electronically signed by: Morgane Naveau MD 09/12/2024 11:34 AM EST RP Workstation: [id number]   CT Cervical Spine Wo Contrast Result Date: 09/12/2024 EXAM: CT CERVICAL SPINE WITHOUT CONTRAST 09/12/2024 09:27:34 AM TECHNIQUE: CT of the cervical spine was performed without the  administration of intravenous contrast. Multiplanar reformatted images are provided for review. Automated exposure control, iterative reconstruction, and/or weight based adjustment of the mA/kV was utilized to reduce the radiation dose to as low as reasonably achievable. COMPARISON: MRI cervical spine 11/08/2021. CLINICAL HISTORY: mechanical fall. FINDINGS: BONES AND ALIGNMENT: No acute fracture or traumatic malalignment. DEGENERATIVE CHANGES: Overall mild multilevel disc degeneration for age. Moderate multilevel facet arthrosis. Mild spinal stenosis at C3-C4. SOFT TISSUES: No prevertebral soft tissue swelling. Partially visualized patchy left upper lobe pulmonary opacities. IMPRESSION: 1. No acute cervical spine fracture or traumatic malalignment. 2. Partially visualized patchy left upper lobe pulmonary opacities, which could be infectious or inflammatory; correlate with pending chest radiograph. Electronically signed by: Dasie Hamburg MD 09/12/2024 10:02 AM EST RP Workstation: [id number]   CT Head Wo Contrast Result Date: 09/12/2024 EXAM: CT HEAD WITHOUT CONTRAST 09/12/2024 09:27:34 AM TECHNIQUE: CT of the head was performed without the administration of intravenous contrast. Automated exposure control, iterative reconstruction, and/or weight based adjustment of the mA/kV was utilized to reduce the radiation dose to as low as reasonably achievable. COMPARISON: Head CT 01/01/2013 CLINICAL HISTORY: syncopal episode, fall FINDINGS: BRAIN AND VENTRICLES: There is no evidence of an acute infarct, intracranial hemorrhage, mass, midline shift, hydrocephalus, or extra-axial fluid collection. Mild cerebral atrophy is within normal limits for age. Cerebral white matter hypodensities are nonspecific but compatible with mild chronic small vessel ischemic disease. Calcified atherosclerosis at the skull base. ORBITS: Bilateral cataract extraction. SINUSES: Scattered mild, partially polypoid mucosal thickening in the paranasal  sinuses. Clear mastoid air cells. SOFT TISSUES AND SKULL: No acute soft tissue abnormality. No skull fracture. IMPRESSION: 1. No acute intracranial abnormality. 2. Mild chronic small vessel ischemic disease. Electronically signed by: Dasie Hamburg MD 09/12/2024 09:57 AM EST RP Workstation: [id number]   Data Reviewed: Relevant notes from primary care and specialist visits, past discharge summaries as available in EHR, including Care Everywhere . Prior diagnostic testing as pertinent to current admission diagnoses, Updated medications and problem lists for reconciliation .ED course, including vitals, labs, imaging, treatment and response to treatment,Triage notes, nursing and pharmacy notes and ED provider's notes.Notable results as noted in HPI.Discussed case with EDMD/ ED APP/ or Specialty MD on call and as needed.  Assessment & Plan   >>Syncope/Collapse: Attribute to hypotension. Suspect sepsis 2nd to PNA.  Respiratory panel negative for flu RSV and COVID.  Due to sepsis concern would also continue patient on his broad-spectrum IV antibiotics.  Continuous pulse oximetry. Head CT negative for any acute intracranial  abnormality.  No acute cervical spine fracture or traumatic malalignment. Continue with gentle IV fluid hydration strict I's and O's Daily weights. Neurochecks. Will evaluate for VTE.  Patient is also high risk for TIA due to his underlying A-flutter.  Will obtain an MRI of the brain noncontrast.  Aspiration precaution fall precaution swallow evaluation. Will hold the patient's PTA meds which include hydrochlorothiazide . Patient is also at high risk for syncope from his underlying aortic stenosis.  Will request cardiology consult.  >> Sepsis secondary to pneumonia: -Will continue with sepsis measures with antibacterial antipyretics antiemetics IV fluid hydration overnight follow culture sensitivity.  Procalcitonin ordered and pending. If D-dimer is not negative will proceed with a CTA chest  PE protocol.    >> Hypokalemia: Will replace and follow levels magnesium  level added on and pending.   >> Lactic acidosis: Secondary to hypoperfusion, currently has resolved, continue with gentle IV fluid hydration overnight.    >> Myasthenia gravis: Per neurology note patient is no longer on Mestinon  and is on CellCept  will have med verification confirm and then resume home meds. Per wife at bedside patient is on as needed Mestinon  only.   DVT prophylaxis:  Heparin  Consults:  Cardiology  Advance Care Planning:    Code Status: Full Code   Family Communication:  Wife Marcos Disposition Plan:  Home Severity of Illness: The appropriate patient status for this patient is INPATIENT. Inpatient status is judged to be reasonable and necessary in order to provide the required intensity of service to ensure the patient's safety. The patient's presenting symptoms, physical exam findings, and initial radiographic and laboratory data in the context of their chronic comorbidities is felt to place them at high risk for further clinical deterioration. Furthermore, it is not anticipated that the patient will be medically stable for discharge from the hospital within 2 midnights of admission.   * I certify that at the point of admission it is my clinical judgment that the patient will require inpatient hospital care spanning beyond 2 midnights from the point of admission due to high intensity of service, high risk for further deterioration and high frequency of surveillance required.*  Unresulted Labs (From admission, onward)     Start     Ordered   09/13/24 0500  Magnesium   Tomorrow morning,   R        09/12/24 1440   09/13/24 0500  Comprehensive metabolic panel with GFR  Tomorrow morning,   R        09/12/24 1440   09/13/24 0500  CBC with Differential/Platelet  Tomorrow morning,   R        09/12/24 1440   09/13/24 0500  Phosphorus  Tomorrow morning,   R        09/12/24 1440   09/13/24  0500  CBC  Tomorrow morning,   R        09/12/24 1633   09/12/24 1635  D-dimer, quantitative  Add-on,   AD        09/12/24 1634   09/12/24 1440  Procalcitonin  Add-on,   AD        09/12/24 1439   09/12/24 1357  Magnesium   Add-on,   AD        09/12/24 1356   09/12/24 1223  MRSA Next Gen by PCR, Nasal  (MRSA Screening)  Once,   URGENT        09/12/24 1223   09/12/24 0845  Blood Culture (routine x 2)  (Undifferentiated presentation (screening labs  and basic nursing orders))  BLOOD CULTURE X 2,   STAT      09/12/24 0845            Meds ordered this encounter  Medications   DISCONTD: sodium chloride  0.9 % bolus 1,000 mL   sodium chloride  0.9 % bolus 500 mL   sodium chloride  0.9 % bolus 500 mL   vancomycin  (VANCOREADY) IVPB 1500 mg/300 mL    Indication::   Pneumonia   DISCONTD: piperacillin -tazobactam (ZOSYN ) IVPB 3.375 g    Antibiotic Indication::   Aspiration Pneumonia   ceFEPIme  (MAXIPIME ) 2 g in sodium chloride  0.9 % 100 mL IVPB    Antibiotic Indication::   HCAP   metroNIDAZOLE  (FLAGYL ) IVPB 500 mg    Antibiotic Indication::   Other Indication (list below)    Other Indication::   PNA   potassium chloride  SA (KLOR-CON  M) CR tablet 40 mEq   heparin  injection 5,000 Units   sodium chloride  flush (NS) 0.9 % injection 3 mL   0.9 %  sodium chloride  infusion   OR Linked Order Group    acetaminophen  (TYLENOL ) tablet 650 mg    acetaminophen  (TYLENOL ) suppository 650 mg   HYDROcodone -acetaminophen  (NORCO/VICODIN) 5-325 MG per tablet 1 tablet    Refill:  0   morphine  (PF) 2 MG/ML injection 2 mg   metroNIDAZOLE  (FLAGYL ) IVPB 500 mg    Antibiotic Indication::   Other Indication (list below)    Other Indication::   PNA   DISCONTD: ceFEPIme  (MAXIPIME ) 2 g in sodium chloride  0.9 % 100 mL IVPB    Antibiotic Indication::   Sepsis   ceFEPIme  (MAXIPIME ) 2 g in sodium chloride  0.9 % 100 mL IVPB    Antibiotic Indication::   Sepsis   vancomycin  (VANCOREADY) IVPB 1250 mg/250 mL     Indication::   Sepsis     Orders Placed This Encounter  Procedures   LACERATION REPAIR   Critical Care   Blood Culture (routine x 2)   Resp panel by RT-PCR (RSV, Flu A&B, Covid) Anterior Nasal Swab   MRSA Next Gen by PCR, Nasal   DG Chest Port 1 View   CT Head Wo Contrast   CT Cervical Spine Wo Contrast   DG Pelvis Portable   MR BRAIN WO CONTRAST   Comprehensive metabolic panel   CBC with Differential   Protime-INR   Urinalysis, w/ Reflex to Culture (Infection Suspected) -Urine, Clean Catch   Magnesium    Procalcitonin   Magnesium    Comprehensive metabolic panel with GFR   CBC with Differential/Platelet   Phosphorus   CBC   D-dimer, quantitative   DIET DYS 2 Room service appropriate? Yes; Fluid consistency: Thin   Document height and weight   Assess and Document Glasgow Coma Scale   Document vital signs within 1-hour of fluid bolus completion.  Notify provider of abnormal vital signs despite fluid resuscitation.   Refer to Sidebar Report: Sepsis Bundle ED/IP   Notify provider for difficulties obtaining IV access   Initiate Carrier Fluid Protocol   ED Cardiac monitoring   Orthostatic vital signs   Maintain IV access   Vital signs   Notify physician (specify)   Refer to Sidebar Report Mobility Protocol for Adult Inpatient   Initiate Adult Central Line Maintenance and Catheter Clearance Protocol for patients with central line (CVC, PICC, Port, Hemodialysis, Trialysis)   Daily weights   Intake and Output   Initiate CHG Protocol for patients in ICU/SD or any patient with a central line or  foley catheter   Do not place and if present remove PureWick   Initiate Oral Care Protocol   Initiate Carrier Fluid Protocol   RN may order General Admission PRN Orders utilizing General Admission PRN medications (through manage orders) for the following patient needs: allergy symptoms (Claritin), cold sores (Carmex), cough (Robitussin DM), eye irritation (Liquifilm Tears), hemorrhoids  (Tucks), indigestion (Maalox), minor skin irritation (Hydrocortisone Cream), muscle pain Lucienne Gay), nose irritation (saline nasal spray) and sore throat (Chloraseptic spray).   Cardiac Monitoring - Continuous Indefinite   Ambulate with assistance   Swallow screen   Strict intake and output   Full code   Consult to hospitalist   Inpatient consult to Cardiology   CeFEPIme  (MAXIPIME ) per pharmacy consult            vancomycin  per pharmacy consult   Pulse oximetry check with vital signs   Oxygen therapy Mode or (Route): Nasal cannula; Liters Per Minute: 2; Keep O2 saturation between: greater than 92 %   I-Stat Lactic Acid, ED   ED EKG   EKG 12-Lead   ED EKG   EKG 12-Lead   ECHOCARDIOGRAM COMPLETE   Insert peripheral IV X 1   Admit to Inpatient (patient's expected length of stay will be greater than 2 midnights or inpatient only procedure)   Aspiration precautions   Fall precautions    Author: Mario LULLA Blanch, MD 12 pm- 8 pm. [facility]. 09/12/2024 6:15 PM Please note for any communication after hours contact [facility] Assigned provider on call on Amion.       [1]  Allergies Allergen Reactions   Ciprofloxacin  Anaphylaxis    Avoid due to myasthenia gravis  Other Reaction(s): Not to take with MS   Quinolones Anaphylaxis    Avoid due to myasthenia gravis   "

## 2024-09-12 NOTE — ED Notes (Signed)
 Lactic results to Victor Sutton at

## 2024-09-12 NOTE — Telephone Encounter (Signed)
" ° °  Patient's wife called Answering Service with concerns about patient developing chills overnight. Called and spoke with wife (okay per DPR). Patient underwent R/ LHC on 09/06/2024 for evaluation of severe aortic stenosis. Wife states he has done well since then until last night when he developed significant chills. She states he was shaking the bed. She also reports he was having some brief dizziness when getting up to use the bathroom overnight. She states cath site looks good. He has a mild cough which is not unusual for him but no shortness of breath. They do not have a working thermometer but wife states he does not feel like he has a fever. No obvious infectious symptoms. This does not sound cardiac in nature. I worry that he may have some viral illness as there is a lot going around. Recommended making sure he stays well hydrated and recommended reaching out to PCP this morning. Reviewed ED precautions as well.  Nyshaun Standage E Jabarri Stefanelli, PA-C 09/12/2024 6:58 AM   "

## 2024-09-13 ENCOUNTER — Inpatient Hospital Stay (HOSPITAL_COMMUNITY)

## 2024-09-13 DIAGNOSIS — I350 Nonrheumatic aortic (valve) stenosis: Secondary | ICD-10-CM

## 2024-09-13 DIAGNOSIS — R55 Syncope and collapse: Secondary | ICD-10-CM | POA: Diagnosis not present

## 2024-09-13 LAB — CBC WITH DIFFERENTIAL/PLATELET
Abs Immature Granulocytes: 0.06 K/uL (ref 0.00–0.07)
Basophils Absolute: 0 K/uL (ref 0.0–0.1)
Basophils Relative: 0 %
Eosinophils Absolute: 0.1 K/uL (ref 0.0–0.5)
Eosinophils Relative: 1 %
HCT: 33 % — ABNORMAL LOW (ref 39.0–52.0)
Hemoglobin: 11.5 g/dL — ABNORMAL LOW (ref 13.0–17.0)
Immature Granulocytes: 1 %
Lymphocytes Relative: 11 %
Lymphs Abs: 1.3 K/uL (ref 0.7–4.0)
MCH: 33 pg (ref 26.0–34.0)
MCHC: 34.8 g/dL (ref 30.0–36.0)
MCV: 94.8 fL (ref 80.0–100.0)
Monocytes Absolute: 0.8 K/uL (ref 0.1–1.0)
Monocytes Relative: 7 %
Neutro Abs: 9.7 K/uL — ABNORMAL HIGH (ref 1.7–7.7)
Neutrophils Relative %: 80 %
Platelets: 171 K/uL (ref 150–400)
RBC: 3.48 MIL/uL — ABNORMAL LOW (ref 4.22–5.81)
RDW: 13.2 % (ref 11.5–15.5)
WBC: 12 K/uL — ABNORMAL HIGH (ref 4.0–10.5)
nRBC: 0 % (ref 0.0–0.2)

## 2024-09-13 LAB — COMPREHENSIVE METABOLIC PANEL WITH GFR
ALT: 12 U/L (ref 0–44)
AST: 27 U/L (ref 15–41)
Albumin: 3 g/dL — ABNORMAL LOW (ref 3.5–5.0)
Alkaline Phosphatase: 34 U/L — ABNORMAL LOW (ref 38–126)
Anion gap: 6 (ref 5–15)
BUN: 12 mg/dL (ref 8–23)
CO2: 24 mmol/L (ref 22–32)
Calcium: 8.3 mg/dL — ABNORMAL LOW (ref 8.9–10.3)
Chloride: 101 mmol/L (ref 98–111)
Creatinine, Ser: 0.64 mg/dL (ref 0.61–1.24)
GFR, Estimated: >60 mL/min
Glucose, Bld: 97 mg/dL (ref 70–99)
Potassium: 3.8 mmol/L (ref 3.5–5.1)
Sodium: 132 mmol/L — ABNORMAL LOW (ref 135–145)
Total Bilirubin: 1.4 mg/dL — ABNORMAL HIGH (ref 0.0–1.2)
Total Protein: 5.3 g/dL — ABNORMAL LOW (ref 6.5–8.1)

## 2024-09-13 LAB — ECHOCARDIOGRAM COMPLETE
AR max vel: 0.92 cm2
AV Area VTI: 0.95 cm2
AV Area mean vel: 0.92 cm2
AV Mean grad: 26 mmHg
AV Peak grad: 41.5 mmHg
Ao pk vel: 3.22 m/s
Height: 69 in
P 1/2 time: 643 ms
S' Lateral: 2.5 cm
Weight: 2335.99 [oz_av]

## 2024-09-13 LAB — PROCALCITONIN: Procalcitonin: 0.54 ng/mL

## 2024-09-13 LAB — D-DIMER, QUANTITATIVE: D-Dimer, Quant: 0.35 ug{FEU}/mL (ref 0.00–0.50)

## 2024-09-13 LAB — PHOSPHORUS: Phosphorus: 2.1 mg/dL — ABNORMAL LOW (ref 2.5–4.6)

## 2024-09-13 LAB — MAGNESIUM
Magnesium: 1.7 mg/dL (ref 1.7–2.4)
Magnesium: 1.7 mg/dL (ref 1.7–2.4)

## 2024-09-13 LAB — MRSA NEXT GEN BY PCR, NASAL: MRSA by PCR Next Gen: NOT DETECTED

## 2024-09-13 MED ORDER — PYRIDOSTIGMINE BROMIDE 60 MG PO TABS: 30.0000 mg | ORAL_TABLET | Freq: Two times a day (BID) | ORAL | Status: DC | PRN

## 2024-09-13 NOTE — Progress Notes (Addendum)
"  °  Progress Note  Patient Name: Victor Sutton Date of Encounter: 09/13/2024 [facility] Cardiologist: Lonni LITTIE Nanas, MD   Interval Summary   No acute events overnight  Afebrile  Head MRI WNL  WBC downtrending  Patient feels somewhat better  Vital Signs Vitals:   09/12/24 2108 09/13/24 0121 09/13/24 0122 09/13/24 0500  BP: (!) 109/54  (!) 118/59 110/79  Pulse: 73  73 66  Resp: 18  20 19   Temp: 98 F (36.7 C) 98.2 F (36.8 C)  98.2 F (36.8 C)  TempSrc: Oral Oral  Oral  SpO2: 97%  96% 96%  Weight:      Height:        Intake/Output Summary (Last 24 hours) at 09/13/2024 0748 Last data filed at 09/13/2024 0646 Gross per 24 hour  Intake 198.1 ml  Output 350 ml  Net -151.9 ml      09/12/2024    8:30 AM 09/06/2024    8:34 AM 08/26/2024    2:49 PM  Last 3 Weights  Weight (lbs) 146 lb 146 lb 146 lb 3.2 oz  Weight (kg) 66.225 kg 66.225 kg 66.316 kg      Telemetry/ECG  SR with PACs - Personally Reviewed  Physical Exam  GEN: No acute distress.   Neck: No JVD Cardiac: RRR, 2/6 SEM rubs, or gallops.  Respiratory: Coarse. GI: Soft, nontender, non-distended  MS: No edema  Assessment & Plan  Sepsis/PNA CXR with possible LLL infiltrate Procalcitonin pending Remains afebrile WBC downtrending, lactate 2.8 >>1.4 Continue abx [facility] 12/30 NGTD Low suspicion for endocarditis If cultures + may need to consider TEE Aortic stenosis Paradoxical low flow low gradient, undergoing TAVR evaluation TTE today Hypertension Continue to hold home meds for now      For questions or updates, please contact [facility] Please consult [email or web address] for contact info under         Signed, Hersel Mcmeen K Rohan Juenger, MD   "

## 2024-09-13 NOTE — Progress Notes (Signed)
" °  Progress Note   Patient: Victor Sutton FMW:[id number] DOB: 1928-02-16 DOA: 09/12/2024     1 DOS: the patient was seen and examined on 09/13/2024 at 9:14AM and again at 4:55 PM      Brief hospital course: 89 y.o. M with LFLG AS pending TAVR eval, HTN, MG on Cellcept , lumbar stenosis, HTN, and Aflutter not on [facility] who presented with fall in the bathroom, found to have SBP 60s.  CXR here showed Left base opacity.  Cardiology consulted re: AS     Assessment and Plan: Sepsis from pneumonia Presented with tachypnea, leukocytosis, elevated lactic acid, elevated troponin, acute metabolic encephalopathy, and hypotension.  Found to have left lower lobe pneumonia. - Continue vancomycin  and cefepime   Fall Due to pneumonia, sepsis, aortic stenosis, myasthenia gravis  Myasthenia gravis Poor prognostic factor - Hold Mycophenolate  given sepsis - Continue pyridostigmine   Aortic stenosis Agree with Cardiology, not likely to be related, but poor prognostic factor - Follow blood cultures - Echo today  Hypertension - Hold hydrochlorothiazide   BPH - Hold Flomax       Subjective: Patient is quite weak and tired, no new focal symptoms.     Physical Exam: BP (!) 122/56 (BP Location: Right Arm)   Pulse 74   Temp 98.2 F (36.8 C) (Oral)   Resp 17   Ht 5' 9 (1.753 m)   Wt 66.2 kg   SpO2 99%   BMI 21.56 kg/m   Frail elderly male, lying in bed, sluggish and tired RRR, soft sleeping systolic murmur, no peripheral edema Respiratory rate normal, lungs clear without rales or wheezes Abdomen soft no tenderness palpation or guarding, no ascites or distention   Data Reviewed: Basic metabolic panel shows hyponatremia, normal renal function CBC shows leukocytosis, mild anemia    Family Communication: Wife at the bedside    Disposition: Status is: Inpatient         Author: Lonni SHAUNNA Dalton, MD 09/13/2024 9:34 AM  For on call review [email or web address]    "

## 2024-09-13 NOTE — Progress Notes (Signed)
 Echocardiogram 2D Echocardiogram has been performed.  Victor Sutton 09/13/2024, 9:43 AM

## 2024-09-13 NOTE — Hospital Course (Signed)
 89 y.o. M with LFLG AS pending TAVR eval, HTN, MG on Cellcept , lumbar stenosis, HTN, and Aflutter not on [facility] who presented with fall in the bathroom, found to have SBP 60s.  CXR here showed Left base opacity.  Cardiology consulted re: AS

## 2024-09-14 DIAGNOSIS — R55 Syncope and collapse: Secondary | ICD-10-CM | POA: Diagnosis not present

## 2024-09-14 LAB — COMPREHENSIVE METABOLIC PANEL WITH GFR
ALT: 12 U/L (ref 0–44)
AST: 27 U/L (ref 15–41)
Albumin: 3.1 g/dL — ABNORMAL LOW (ref 3.5–5.0)
Alkaline Phosphatase: 41 U/L (ref 38–126)
Anion gap: 8 (ref 5–15)
BUN: 10 mg/dL (ref 8–23)
CO2: 25 mmol/L (ref 22–32)
Calcium: 8.4 mg/dL — ABNORMAL LOW (ref 8.9–10.3)
Chloride: 98 mmol/L (ref 98–111)
Creatinine, Ser: 0.63 mg/dL (ref 0.61–1.24)
GFR, Estimated: >60 mL/min
Glucose, Bld: 97 mg/dL (ref 70–99)
Potassium: 3.7 mmol/L (ref 3.5–5.1)
Sodium: 130 mmol/L — ABNORMAL LOW (ref 135–145)
Total Bilirubin: 1.5 mg/dL — ABNORMAL HIGH (ref 0.0–1.2)
Total Protein: 5.7 g/dL — ABNORMAL LOW (ref 6.5–8.1)

## 2024-09-14 MED ORDER — SODIUM CHLORIDE 0.9 % IV SOLN
2.0000 g | INTRAVENOUS | Status: DC
  Administered 2024-09-14 – 2024-09-15 (×2): 2 g via INTRAVENOUS
  Filled 2024-09-14 (×2): qty 20

## 2024-09-14 MED ORDER — ENSURE PLUS HIGH PROTEIN PO LIQD
237.0000 mL | Freq: Two times a day (BID) | ORAL | Status: DC
  Administered 2024-09-14 – 2024-09-15 (×3): 237 mL via ORAL

## 2024-09-14 MED ORDER — ENOXAPARIN SODIUM 40 MG/0.4ML IJ SOSY
40.0000 mg | PREFILLED_SYRINGE | INTRAMUSCULAR | Status: DC
  Administered 2024-09-15: 40 mg via SUBCUTANEOUS
  Filled 2024-09-14: qty 0.4

## 2024-09-14 MED ORDER — TAMSULOSIN HCL 0.4 MG PO CAPS
0.4000 mg | ORAL_CAPSULE | Freq: Every day | ORAL | Status: DC
  Administered 2024-09-14: 0.4 mg via ORAL
  Filled 2024-09-14: qty 1

## 2024-09-14 MED ORDER — TAMSULOSIN HCL 0.4 MG PO CAPS
0.4000 mg | ORAL_CAPSULE | Freq: Once | ORAL | Status: AC
  Administered 2024-09-14: 0.4 mg via ORAL
  Filled 2024-09-14: qty 1

## 2024-09-14 MED ORDER — POLYETHYLENE GLYCOL 3350 17 G PO PACK
17.0000 g | PACK | Freq: Every day | ORAL | Status: DC
  Administered 2024-09-14 – 2024-09-15 (×2): 17 g via ORAL
  Filled 2024-09-14 (×2): qty 1

## 2024-09-14 NOTE — Progress Notes (Signed)
 OT Cancellation Note  Patient Details Name: Victor Sutton MRN: [id number] DOB: 02-14-1928   Cancelled Treatment:    Reason Eval/Treat Not Completed: Fatigue/lethargy limiting ability to participate (Pt asking to rest s/p OT session. Will follow up at a later time.)  [facility] 09/14/2024, 10:28 AM Kreg Sink, OT/L   Acute OT Clinical Specialist Acute Rehabilitation Services Pager [phone] Office [phone]

## 2024-09-14 NOTE — TOC CM/SW Note (Signed)
 Transition of Care [facility]) - Inpatient Brief Assessment   Patient Details  Name: Victor Sutton MRN: [id number] Date of Birth: 1928-01-17  [facility] CM/SW Contact:    Tom-Fiorenza, Harvest Muskrat, RN Phone Number: 09/14/2024, 1:49 PM   Clinical Narrative:  Patient presented to the ED with Shortness of Breath, Dizziness and a missed Fall at home. Patient recently underwent Rt and Lt Heart Cath and Coronary Angiogram on 09/06/24. Chest X-ray showed Lt Basilar patchy Opacity, Infectious Inflammatory or Atelectasis. On IV abx.  Has hx of Myasthenia Gravis on CellCept , Lumbar Stenosis, B12 Deficiency, GERD, HLD, HTN, A-Flutter currently not on anticoagulation, Aortic Stenosis. Cardiology following.  CM spoke with patient at bedside and wife, Victor Sutton via phone. Patient lives with wife, has two supportive children. Modified independent at home, has a cane, walker, shower seat and grab bars at home. Does not drive, wife transports to and from appointments.  PCP is [name], MD and uses [facility] on [address]    Home health recommended, patient and wife chose [facility], states patient has used their services before. Referral sent through the hub and CM spoke with [facility], acceptance voiced, info on AVS.   Patient not Medically ready for discharge.  CM will continue to follow as patient progresses with care towards discharge.            Transition of Care Asessment: Insurance and Status: Insurance coverage has been reviewed Patient has primary care physician: Yes Home environment has been reviewed: Yes Prior level of function:: Modified Independent Prior/Current Home Services: No current home services Social Drivers of Health Review: SDOH reviewed no interventions necessary   Transition of care needs: transition of care needs identified, TOC will continue to follow

## 2024-09-14 NOTE — Evaluation (Signed)
 Physical Therapy Evaluation Patient Details Name: Victor Sutton MRN: [id number] DOB: [birth year] Today's Date: 09/14/2024  History of Present Illness  Pt is 89 yo presenting [facility] due to fall with SBP in 60's. Found to have LLL pneumonia. PMH: HTN, myasthenia gravis, lumbar stenosis, HTN and A-flutter pending TAVR eval due to aortic stenosis.  Clinical Impression  Pt is currently presenting at Mod I for bed mobility, CGA for sit to stand and 120 ft of gait with RW. Pt has RW at home and will have some help from neighbor upon first getting home to navigate stairs. Spouse will be able to assist 24/7 with supervision to CGA; demonstrated with sit to stand during session. Due to pt current functional status, home set up and available assistance at home recommending skilled physical therapy services 3x/week in order to address strength, balance and functional mobility to decrease risk for falls, injury and re-hospitalization.           If plan is discharge home, recommend the following: Help with stairs or ramp for entrance;Assist for transportation;A little help with walking and/or transfers;Assistance with cooking/housework     Equipment Recommendations None recommended by PT     Functional Status Assessment Patient has had a recent decline in their functional status and demonstrates the ability to make significant improvements in function in a reasonable and predictable amount of time.     Precautions / Restrictions Precautions Precautions: Fall Recall of Precautions/Restrictions: Intact Precaution/Restrictions Comments: watch BP Restrictions Weight Bearing Restrictions Per Provider Order: No      Mobility  Bed Mobility Overal bed mobility: Modified Independent        Transfers Overall transfer level: Needs assistance Equipment used: Rolling walker (2 wheels) Transfers: Sit to/from Stand Sit to Stand: Contact guard assist           General transfer comment: CGA  for sit to stand with verbal cues for safe hand placement. Pt able to scoot along EOB at supervision    Ambulation/Gait Ambulation/Gait assistance: Contact guard assist Gait Distance (Feet): 120 Feet Assistive device: Rolling walker (2 wheels) Gait Pattern/deviations: Step-through pattern, Decreased stride length Gait velocity: decreased Gait velocity interpretation: <1.8 ft/sec, indicate of risk for recurrent falls   General Gait Details: Initially slightly unsteady, improved significantly with distance  Stairs Stairs:  (demonstrates strength with gait/sit to stand to perform stairs per home set up with rail and MIn to CGA Spouse states neighbor can help them get into the house.)             Balance Overall balance assessment: Needs assistance Sitting-balance support: Feet supported, No upper extremity supported Sitting balance-Leahy Scale: Good     Standing balance support: Bilateral upper extremity supported, During functional activity, Reliant on assistive device for balance Standing balance-Leahy Scale: Fair         Pertinent Vitals/Pain Pain Assessment Pain Assessment: No/denies pain    Home Living Family/patient expects to be discharged to:: Private residence Living Arrangements: Spouse/significant other Available Help at Discharge: Family;Available 24 hours/day Type of Home: House Home Access: Stairs to enter Entrance Stairs-Rails: Left Entrance Stairs-Number of Steps: 3   Home Layout: One level Home Equipment: BSC/3in1;Rolling Walker (2 wheels);Shower seat;Grab bars - tub/shower      Prior Function Prior Level of Function : Independent/Modified Independent             Mobility Comments: wall walker normally especially when dizzy. Goes up stairs to the side with one rail. ADLs Comments:  Mod I to Ind with ADL's/IADLs.     Extremity/Trunk Assessment   Upper Extremity Assessment Upper Extremity Assessment: Defer to OT evaluation    Lower  Extremity Assessment Lower Extremity Assessment: Overall WFL for tasks assessed;Generalized weakness    Cervical / Trunk Assessment Cervical / Trunk Assessment: Kyphotic  Communication   Communication Communication: Impaired Factors Affecting Communication: Hearing impaired    Cognition Arousal: Alert Behavior During Therapy: WFL for tasks assessed/performed   PT - Cognitive impairments: Safety/Judgement     Following commands: Intact       Cueing Cueing Techniques: Verbal cues     General Comments General comments (skin integrity, edema, etc.): spouse present and supportive during session. BP 134/79 in sitting, 111/83 in standing, 121/65 after getting of toilet an 108/62 after gait. O2 sats remained above 92% on room air with gait.        Assessment/Plan    PT Assessment Patient needs continued PT services  PT Problem List Decreased strength;Decreased balance;Decreased mobility       PT Treatment Interventions DME instruction;Therapeutic exercise;Gait training;Balance training;Stair training;Functional mobility training;Therapeutic activities;Patient/family education    PT Goals (Current goals can be found in the Care Plan section)  Acute Rehab PT Goals Patient Stated Goal: improve mobility PT Goal Formulation: With patient/family Time For Goal Achievement: 09/28/24 Potential to Achieve Goals: Good    Frequency Min 2X/week        AM-PAC PT 6 Clicks Mobility  Outcome Measure Help needed turning from your back to your side while in a flat bed without using bedrails?: None Help needed moving from lying on your back to sitting on the side of a flat bed without using bedrails?: None Help needed moving to and from a bed to a chair (including a wheelchair)?: A Little Help needed standing up from a chair using your arms (e.g., wheelchair or bedside chair)?: A Little Help needed to walk in hospital room?: A Little Help needed climbing 3-5 steps with a railing? : A  Little 6 Click Score: 20    End of Session Equipment Utilized During Treatment: Gait belt Activity Tolerance: Patient tolerated treatment well Patient left: in bed;with call bell/phone within reach;with bed alarm set;with family/visitor present Nurse Communication: Mobility status PT Visit Diagnosis: Unsteadiness on feet (R26.81);Other abnormalities of gait and mobility (R26.89);Muscle weakness (generalized) (M62.81)    Time: 9042-8971 PT Time Calculation (min) (ACUTE ONLY): 31 min   Charges:   PT Evaluation $PT Eval Low Complexity: 1 Low PT Treatments $Therapeutic Activity: 8-22 mins PT General Charges $$ ACUTE PT VISIT: 1 Visit    Dorothyann Maier, DPT, CLT  Acute Rehabilitation Services Office: [phone] (Secure chat preferred)   Dorothyann VEAR Maier 09/14/2024, 11:24 AM

## 2024-09-14 NOTE — Progress Notes (Addendum)
 "  Progress Note  Patient Name: Victor Sutton Date of Encounter: 09/14/2024 [facility] Cardiologist: Lonni LITTIE Nanas, MD   Interval Summary   Patient is doing well today. States that he is feeling much better compared to yesterday. Denies any chest discomfort, shortness of breath, dizziness/lightheadedness, syncope, or lower extremity swelling at this time. Reports that he has not had any urine output overnight, nursing stated that he will be restarted on Flomax. Otherwise, no other acute complaints. Wife is at bedside.   Vital Signs Vitals:   09/13/24 2011 09/13/24 2359 09/14/24 0325 09/14/24 0903  BP: 126/79 (!) 115/56 134/69 129/73  Pulse: 79 73 69 73  Resp: 17 18 16 16   Temp: 98.5 F (36.9 C) 99.9 F (37.7 C) 98.6 F (37 C) (!) 97.5 F (36.4 C)  TempSrc: Oral Axillary Oral Oral  SpO2: 98% 100% 99% 96%  Weight:   66.3 kg   Height:        Intake/Output Summary (Last 24 hours) at 09/14/2024 1000 Last data filed at 09/14/2024 0943 Gross per 24 hour  Intake 563 ml  Output 100 ml  Net 463 ml      09/14/2024    3:25 AM 09/12/2024    8:30 AM 09/06/2024    8:34 AM  Last 3 Weights  Weight (lbs) 146 lb 3.2 oz 146 lb 146 lb  Weight (kg) 66.316 kg 66.225 kg 66.225 kg      Telemetry/ECG  Sinus rhythm, rates in the 70s - Personally Reviewed  Physical Exam  GEN: Frail elderly male sitting upright comfortably in bed, eating breakfast, in no acute distress.   Neck: No JVD Cardiac: RRR, +systolic murmur at RUSB Respiratory: Clear to auscultation bilaterally. GI: Soft, nontender, non-distended  MS: No peripheral edema  Assessment & Plan   Sepsis/PNA CXR with possible LLL infiltrate Remains afebrile with downtrending WBC Lactic acid 2.8 >> 1.4 Blood cultures negative as of today, low suspicion for endocarditis Ongoing abx management per primary  Severe nonrheumatic paradoxical low-flow/low-gradient aortic stenosis Following with Dr. Wonda as  outpatient, plans for TAVR in February 2026 per patient's wife Echo [08/15/24]: PV 3.0 m/ sec, MG 18 mmHg, AVA 0.9 cm2, DVI 0.22 and SVI 30  Echo [12/31]: Mod-severe, paradoxical LFLG AS, AVA 0.9 cm2, AV mean gradient 26 mmHg R/LHC [09/06/24]: mild nonobstructive CAD, severe AS, fairly normal filling pressures Pre-TAVR scans scheduled for January 2026 Low suspicion that patient's AS is driving current symptom complex No growth on blood cultures, no concern for endocarditis at this time Continue TAVR workup as previously planned  Hypertension SBP in the 60s on arrival to ED Treating sepsis/PNA with IV fluids and antibiotics BP normotensive/stable Tamsulosin previously on hold but restarted this AM due to minimal urine output overnight Continue to hold home antihypertensives while treating for sepsis/PNA  Remote history of atrial flutter Single episode of atrial flutter, post-op following hernia surgery in 2014 Zio patch [12/2020] showed frequent PACs, occasional PVCs, some SVT episodes CHA2DS2-VASc score of 3 Not currently on anticoagulation given comorbidities and ongoing fall risk Currently in sinus rhythm Continue telemetry  Per primary Myasthenia gravis BPH   For questions or updates, please contact [facility] Please consult [email or web address] for contact info under         Signed, Owen MARLA Daniels, PA-C    ATTENDING ATTESTATION:  After conducting a review of all available clinical information with the care team, interviewing the patient, and performing a physical exam, I agree with the  findings and plan described in this note with adjustments as indicated below which were discussed and enacted by staff above.   GEN: No acute distress, AO x 3, frail HEENT:  MMM, no JVD, no scleral icterus Cardiac: RRR, 2/6 SEM no rubs, or gallops.  Respiratory: Clear to auscultation bilaterally. GI: Soft, nontender, non-distended  MS: No edema; No deformity. Neuro:  Nonfocal  Vasc:   +2 radial pulses  Patient doing well and improving with abx therapy.  Blood cultures remain NGTD.  TTE without any overt evidence of endocarditis and known, relatively unchanged aortic valvular disease.  Continue current therapy.  No further cardiac evaluation required.  Patient being evaluated as outpatient for TAVR.  Will sign off, call with ?s  Lurena Red, MD Pager [phone]   "

## 2024-09-14 NOTE — Progress Notes (Signed)
" °  Progress Note   Patient: Victor Sutton FMW:[id number] DOB: Aug 26, 1928 DOA: 09/12/2024     2 DOS: the patient was seen and examined on 09/14/2024 at 9:26 AM      Brief hospital course: 89 y.o. M with LFLG AS pending TAVR eval, HTN, MG on Cellcept , lumbar stenosis, HTN, and Aflutter not on [facility] who presented with fall in the bathroom, found to have SBP 60s.  CXR here showed Left base opacity.  Cardiology consulted re: AS     Assessment and Plan: Sepsis from pneumonia -Continue antibiotics, downgrade to Rocephin , day 3  Myasthenia gravis -Continue pyridostigmine  as needed - Hold mycophenolate  for now given sepsis, recommend resumption within the next week  Aortic stenosis Blood cultures no growth at 48 hours, echocardiogram shows no significant change  Hypertension -Hold HCTZ  BPH -Resume Flomax         Subjective: He is gradually improving.  No fever or confusion.     Physical Exam: BP 130/89 (BP Location: Right Arm)   Pulse 77   Temp 97.8 F (36.6 C) (Oral)   Resp 20   Ht 5' 9 (1.753 m)   Wt 66.3 kg   SpO2 97%   BMI 21.59 kg/m   Elderly adult male, lying in bed, interactive and appropriate RRR, systolic murmur, no peripheral edema Respiratory rate normal, lungs clear without rales or wheezes Abdomen soft no tenderness palpation or guarding, no ascites or distention Attention normal, affect normal, judgment insight appear normal    Data Reviewed: metabolic panel shows mild hyponatremia, normal renal function   Family Communication: Wife at the bedside    Disposition: Status is: Inpatient         Author: Lonni SHAUNNA Dalton, MD 09/14/2024 4:59 PM  For on call review [email or web address]    "

## 2024-09-14 NOTE — Evaluation (Signed)
 Occupational Therapy Evaluation Patient Details Name: Victor Sutton MRN: [id number] DOB: 06/28/1928 Today's Date: 09/14/2024   History of Present Illness   Pt is 89 yo presenting to [facility] [place] 12/29 due to fall with SBP in 60's. Found to have LLL pneumonia. PMH: HTN, myasthenia gravis, lumbar stenosis, HTN and A-flutter pending TAVR eval due to aortic stenosis.     Clinical Impressions PTA pt lives with his wife independently. Pt states he usually wall walks at times at home, however is overall able to walk without an assistive device and helps as he is able to with IADL tasks. Pt currently able to ambulate > 120 ft and complete ADL tasks with S @ RW level. Began education on energy conservation and strategies to reduce risk of falls. Acute OT to follow however do not anticipate the need for OT after DC. Pt with mild complaints of dizziness initially with sitting however VSS throughout session.  BPs  Supine   Sitting 118/66     Standing 117/65  After ambulation 117/66        If plan is discharge home, recommend the following:   A little help with walking and/or transfers;A little help with bathing/dressing/bathroom;Assistance with cooking/housework;Assist for transportation     Functional Status Assessment   Patient has had a recent decline in their functional status and demonstrates the ability to make significant improvements in function in a reasonable and predictable amount of time.     Equipment Recommendations   None recommended by OT     Recommendations for Other Services         Precautions/Restrictions   Precautions Precautions: Fall Recall of Precautions/Restrictions: Intact Precaution/Restrictions Comments: watch BP Restrictions Weight Bearing Restrictions Per Provider Order: No     Mobility Bed Mobility Overal bed mobility: Modified Independent                  Transfers Overall transfer level: Needs assistance Equipment used:  Rolling walker (2 wheels) Transfers: Sit to/from Stand Sit to Stand: Supervision                  Balance Overall balance assessment: Needs assistance Sitting-balance support: Feet supported, No upper extremity supported Sitting balance-Leahy Scale: Good     Standing balance support: Bilateral upper extremity supported, During functional activity, Reliant on assistive device for balance Standing balance-Leahy Scale: Fair                             ADL either performed or assessed with clinical judgement   ADL Overall ADL's : Needs assistance/impaired Eating/Feeding: Modified independent   Grooming: Set up;Standing   Upper Body Bathing: Set up;Sitting   Lower Body Bathing: Supervison/ safety;Sit to/from stand   Upper Body Dressing : Set up;Sitting   Lower Body Dressing: Supervision/safety;Sit to/from stand   Toilet Transfer: Supervision/safety;Ambulation;Rolling walker (2 wheels);Grab bars   Toileting- Clothing Manipulation and Hygiene: Modified independent       Functional mobility during ADLs: Supervision/safety;Rolling walker (2 wheels);Cueing for safety General ADL Comments: Educated on strategies to reduce risk of falls in addition to positioning of self in RW/mobilizing via bathroom door sideways     Vision Baseline Vision/History: 6 Macular Degeneration (L; poor vision R) Ability to See in Adequate Light: 2 Moderately impaired Patient Visual Report: Central vision impairment Vision Assessment?: Wears glasses for reading (no changes in vision)     Perception         Praxis  Pertinent Vitals/Pain Pain Assessment Pain Assessment: No/denies pain     Extremity/Trunk Assessment Upper Extremity Assessment Upper Extremity Assessment: Generalized weakness   Lower Extremity Assessment Lower Extremity Assessment: Defer to PT evaluation   Cervical / Trunk Assessment Cervical / Trunk Assessment: Kyphotic   Communication  Communication Communication: Impaired Factors Affecting Communication: Hearing impaired   Cognition Arousal: Alert Behavior During Therapy: WFL for tasks assessed/performed Cognition: Cognition impaired       Memory impairment (select all impairments): Working civil service fast streamer, Programmer, systems, Engineer, structural memory     OT - Cognition Comments: wife reports increased confusion isnce admission; appropriate during session                 Following commands: Intact       Cueing  General Comments   Cueing Techniques: Verbal cues  spouse present and supportive during session. BP 134/79 in sitting, 111/83 in standing, 121/65 after getting of toilet an 108/62 after gait. O2 sats remained above 92% on room air with gait.   Exercises Exercises: Other exercises Other Exercises Other Exercises: incentive spirometer x 5 - pulls @ 1250   Shoulder Instructions      Home Living Family/patient expects to be discharged to:: Private residence Living Arrangements: Spouse/significant other Available Help at Discharge: Family;Available 24 hours/day Type of Home: House Home Access: Stairs to enter [employer] of Steps: 3 Entrance Stairs-Rails: Left Home Layout: One level     Bathroom Shower/Tub: Producer, Television/film/video: Handicapped height Bathroom Accessibility: No   Home Equipment: Teacher, English As A Foreign Language (2 wheels);Shower seat;Grab bars - tub/shower          Prior Functioning/Environment Prior Level of Function : Independent/Modified Independent             Mobility Comments: wall walker normally especially when dizzy. Goes up stairs to the side with one rail. ADLs Comments: Mod I to Ind with ADL's/IADLs. Assists wife with IADL as able/brings in groceries form cars; fixes microwave meals; wife assists due to poor vision; pt does not drive    OT Problem List: Decreased strength;Decreased activity tolerance;Impaired balance (sitting and/or  standing);Decreased knowledge of use of DME or AE;Cardiopulmonary status limiting activity   OT Treatment/Interventions: Self-care/ADL training;Therapeutic exercise;Energy conservation;DME and/or AE instruction;Therapeutic activities;Patient/family education      OT Goals(Current goals can be found in the care plan section)   Acute Rehab OT Goals Patient Stated Goal: home OT Goal Formulation: With patient Time For Goal Achievement: 09/28/24 Potential to Achieve Goals: Good   OT Frequency:  Min 2X/week    Co-evaluation              AM-PAC OT 6 Clicks Daily Activity     Outcome Measure Help from another person eating meals?: None Help from another person taking care of personal grooming?: A Little Help from another person toileting, which includes using toliet, bedpan, or urinal?: A Little Help from another person bathing (including washing, rinsing, drying)?: A Little Help from another person to put on and taking off regular upper body clothing?: A Little Help from another person to put on and taking off regular lower body clothing?: A Little 6 Click Score: 19   End of Session Equipment Utilized During Treatment: Gait belt;Rolling walker (2 wheels) Nurse Communication: Mobility status  Activity Tolerance: Patient tolerated treatment well Patient left: in bed;with call bell/phone within reach;with bed alarm set;with family/visitor present  OT Visit Diagnosis: Unsteadiness on feet (R26.81);Muscle weakness (generalized) (M62.81);History of falling (Z91.81)  Time: 8862-8780 OT Time Calculation (min): 42 min Charges:  OT General Charges $OT Visit: 1 Visit OT Evaluation $OT Eval Low Complexity: 1 Low OT Treatments $Self Care/Home Management : 23-37 mins  Kreg Sink, OT/L   Acute OT Clinical Specialist Acute Rehabilitation Services Pager [phone] Office [phone]   [facility] 09/14/2024, 1:02 PM

## 2024-09-14 NOTE — Plan of Care (Signed)

## 2024-09-15 DIAGNOSIS — R55 Syncope and collapse: Secondary | ICD-10-CM | POA: Diagnosis not present

## 2024-09-15 MED ORDER — AMOXICILLIN-POT CLAVULANATE 875-125 MG PO TABS: 1.0000 | ORAL_TABLET | Freq: Two times a day (BID) | ORAL | 0 refills | Status: DC

## 2024-09-15 MED ORDER — AMOXICILLIN-POT CLAVULANATE 875-125 MG PO TABS: 1.0000 | ORAL_TABLET | Freq: Two times a day (BID) | ORAL | Status: DC

## 2024-09-15 NOTE — Plan of Care (Signed)

## 2024-09-15 NOTE — Progress Notes (Signed)
 Physical Therapy Treatment Patient Details Name: Victor Sutton MRN: [id number] DOB: 07/04/1928 Today's Date: 09/15/2024   History of Present Illness Pt is 89 yo presenting to [facility] [place] 12/29 due to fall with SBP in 60's. Found to have LLL pneumonia. PMH: HTN, myasthenia gravis, lumbar stenosis, HTN and A-flutter pending TAVR eval due to aortic stenosis.    PT Comments  Pt received in supine and agreeable to session with wife present and supportive. Pt able to perform increased gait distance with 1 seated rest break and a stair trial with CGA for safety. Pt's wife instructed in guarding techniques and provided a gait belt for safety. Education provided on activity progression. Pt requests to use the bathroom at end of session and requires assist with pericare. Pt continues to benefit from PT services to progress toward functional mobility goals.    If plan is discharge home, recommend the following: Help with stairs or ramp for entrance;Assist for transportation;A little help with walking and/or transfers;Assistance with cooking/housework   Can travel by private vehicle        Equipment Recommendations  None recommended by PT    Recommendations for Other Services       Precautions / Restrictions Precautions Precautions: Fall Recall of Precautions/Restrictions: Intact Precaution/Restrictions Comments: watch BP Restrictions Weight Bearing Restrictions Per Provider Order: No     Mobility  Bed Mobility Overal bed mobility: Modified Independent                  Transfers Overall transfer level: Needs assistance Equipment used: Rolling walker (2 wheels) Transfers: Sit to/from Stand Sit to Stand: Supervision           General transfer comment: STS from EOB and low toilet seat with cues for hand placement    Ambulation/Gait Ambulation/Gait assistance: Contact guard assist Gait Distance (Feet): 150 Feet (+70) Assistive device: Rolling walker (2 wheels) Gait  Pattern/deviations: Step-through pattern, Decreased stride length, Trunk flexed Gait velocity: decreased     General Gait Details: Pt demonstrates short steps with low foot clearance. Cues for upright posture and increased foot clearance. some instability requiring CGA for safety. 1 seated rest break due to fatigue   Stairs Stairs: Yes Stairs assistance: Contact guard assist Stair Management: Two rails, Alternating pattern Number of Stairs: 3 General stair comments: Ascending forwards and descending backwards with fair stability and no LOB with CGA for safety. Pt's wife instructed in guarding technique and provided gait belt for safety   Wheelchair Mobility     Tilt Bed    Modified Rankin (Stroke Patients Only)       Balance Overall balance assessment: Needs assistance Sitting-balance support: Feet supported, No upper extremity supported Sitting balance-Leahy Scale: Good     Standing balance support: Bilateral upper extremity supported, During functional activity, Reliant on assistive device for balance Standing balance-Leahy Scale: Fair Standing balance comment: with RW support                            Communication Communication Communication: Impaired Factors Affecting Communication: Hearing impaired  Cognition Arousal: Alert Behavior During Therapy: WFL for tasks assessed/performed   PT - Cognitive impairments: Safety/Judgement, Awareness                       PT - Cognition Comments: Pt's wife reports vision impairment that most likely affects his awareness Following commands: Intact      Cueing Cueing Techniques: Verbal cues  Exercises      General Comments        Pertinent Vitals/Pain Pain Assessment Pain Assessment: No/denies pain     PT Goals (current goals can now be found in the care plan section) Acute Rehab PT Goals Patient Stated Goal: improve mobility PT Goal Formulation: With patient/family Time For Goal  Achievement: 09/28/24 Progress towards PT goals: Progressing toward goals    Frequency    Min 2X/week       AM-PAC PT 6 Clicks Mobility   Outcome Measure  Help needed turning from your back to your side while in a flat bed without using bedrails?: None Help needed moving from lying on your back to sitting on the side of a flat bed without using bedrails?: None Help needed moving to and from a bed to a chair (including a wheelchair)?: A Little Help needed standing up from a chair using your arms (e.g., wheelchair or bedside chair)?: A Little Help needed to walk in hospital room?: A Little Help needed climbing 3-5 steps with a railing? : A Little 6 Click Score: 20    End of Session Equipment Utilized During Treatment: Gait belt Activity Tolerance: Patient tolerated treatment well Patient left: in bed;with call bell/phone within reach;with family/visitor present;with nursing/sitter in room Nurse Communication: Mobility status PT Visit Diagnosis: Unsteadiness on feet (R26.81);Other abnormalities of gait and mobility (R26.89);Muscle weakness (generalized) (M62.81)     Time: 8495-8470 PT Time Calculation (min) (ACUTE ONLY): 25 min  Charges:    $Gait Training: 8-22 mins $Therapeutic Activity: 8-22 mins PT General Charges $$ ACUTE PT VISIT: 1 Visit                    Darryle George, PTA Acute Rehabilitation Services Secure Chat Preferred  [phone]    Darryle George 09/15/2024, 3:42 PM

## 2024-09-16 NOTE — Discharge Summary (Signed)
 " Physician Discharge Summary   Patient: Victor Sutton MRN: [id number] DOB: 09-Feb-1928  Admit date:     09/12/2024  Discharge date: 09/15/2024  Discharge Physician: Lonni SHAUNNA Dalton   PCP: [name], MD     Recommendations at discharge:  Follow up with PCP for pneumonia Dr. Loreli: Please resume hydrochlorothiazide  if appropriate Keep appointment with Cardiology for TAVR evaluation       Hospital Course: 89 y.o. M with LFLG AS pending TAVR eval, HTN, MG on Cellcept , lumbar stenosis, HTN, and Aflutter not on [facility] who presented with fall in the bathroom, found to have SBP 60s.  CXR here showed Left base opacity.  Cardiology consulted re: AS     Sepsis from pneumonia Presented with tachypnea, leukocytosis, elevated lactic acid, elevated troponin, acute metabolic encephalopathy, and hypotension.  Found to have left lower lobe pneumonia.  Treated with cefepime , narrowed to Rocephin .  Had mild delirium, but at discharge ambulating with assistance, taking orals.  Temp < 100 F, heart rate < 100bpm, RR < 24, SpO2 at baseline.         Fall Due to pneumonia, sepsis, aortic stenosis, myasthenia gravis   Myasthenia gravis Mycophenolate  held in the hospital.  May resume.   Aortic stenosis Admitted and Cardiology consulted, echo repeated, no significant change.  Agree with Cardiology, not likely to be related, but poor prognostic factor.  Blood cultures remained negative.   Hypertension Held hydrochlorothiazide  at dishcarge due to soft BP   BPH Continued Flomax              The [place]  Controlled Substances Registry was reviewed for this patient prior to discharge.   Consultants: Cardiology Procedures performed: Echo  Disposition: Home health Diet recommendation:  Cardiac diet  DISCHARGE MEDICATION: Allergies as of 09/15/2024       Reactions   Ciprofloxacin  Anaphylaxis   Avoid due to myasthenia gravis Other Reaction(s): Not to take with MS    Quinolones Anaphylaxis   Avoid due to myasthenia gravis        Medication List     PAUSE taking these medications    hydrochlorothiazide  25 MG tablet Wait to take this until your doctor or other care provider tells you to start again. Commonly known as: HYDRODIURIL  Take 1 tablet (25 mg total) by mouth daily.   potassium chloride  10 MEQ tablet Wait to take this until your doctor or other care provider tells you to start again. Commonly known as: KLOR-CON  TAKE 1 TABLET BY MOUTH ONCE DAILY       TAKE these medications    Align 4 MG Caps Take 4 mg by mouth daily.   amoxicillin-clavulanate 875-125 MG tablet Commonly known as: AUGMENTIN Take 1 tablet by mouth every 12 (twelve) hours.   ascorbic acid 250 MG tablet Commonly known as: VITAMIN C Take 250 mg by mouth 2 (two) times daily. Am + dinner   CALCIUM PO Take 1 tablet by mouth daily.   chlorhexidine  0.12 % solution Commonly known as: PERIDEX  Use as directed 5 mLs in the mouth or throat 2 (two) times daily.   ICaps Caps Take 1 capsule by mouth in the morning and at bedtime. Preservision: Take in 1 capsule in morning and 1 in evening at dinner time.   Lutein 40 MG Caps Take 40 mg by mouth daily.   pantoprazole  40 MG tablet Commonly known as: PROTONIX  Take 40 mg by mouth daily. May take an additional 40mg s as needed for heartburn  Prevagen 10 MG Caps Generic drug: Apoaequorin Take 1 Capful by mouth in the morning.   Prolia  60 MG/ML Sosy injection Generic drug: denosumab  Inject 60 mg into the skin every 6 (six) months.   pyridostigmine  60 MG tablet Commonly known as: Mestinon  Take 0.5 tablets (30 mg total) by mouth 2 (two) times daily as needed.   Systane Ultra 0.4-0.3 % Soln Generic drug: Polyethyl Glycol-Propyl Glycol Apply 1-2 drops to eye daily as needed (dry eye).   Systane 0.4-0.3 % Gel ophthalmic gel Generic drug: Polyethyl Glycol-Propyl Glycol Place 1 Application into both eyes at bedtime  as needed (dry eye).   tamsulosin 0.4 MG Caps capsule Commonly known as: FLOMAX Take 0.4 mg by mouth at bedtime.   UNABLE TO FIND Take 1 tablet by mouth daily. Med Name:EyePromise  Zeaxanthin 10mg  and lutein 10mg    Vitamin D (Ergocalciferol) 1.25 MG (50000 UNIT) Caps capsule Commonly known as: DRISDOL Take 50,000 Units by mouth every 7 (seven) days. ON FRIDAYS        Contact information for after-discharge care     Home Medical Care     [facility]) .   Service: Home Health Services Why: Someone will call you to schedule first home visit. Contact information: [address] [place]  [address] [phone]                     Discharge Instructions     Discharge instructions   Complete by: As directed    **IMPORTANT DISCHARGE INSTRUCTIONS**   From Dr. Jonel: You were admitted for pneumonia  We were concerned that your aortic valve may have been affecting this, but after an echocardiogram and evaluation by our Cardiologist, we do not believe so  YOu had some mild delirium, I hope this will improve with returning home and good rest  Resume your normal home medicines  Take the antibiotic Augmentin 875-125 mg twice daily for 3 more days, starting tomorrow   Go see your primary doctor in 1 week   Increase activity slowly   Complete by: As directed    No wound care   Complete by: As directed        Discharge Exam: Filed Weights   09/12/24 0830 09/14/24 0325  Weight: 66.2 kg 66.3 kg    General: Pt is alert, awake, not in acute distress, tired Cardiovascular: RRR, nl S1-S2, no murmurs appreciated.   No LE edema.   Respiratory: Normal respiratory rate and rhythm.  CTAB without rales or wheezes. Abdominal: Abdomen soft and non-tender.  No distension or HSM.   Neuro/Psych: Strength symmetric in upper and lower extremities.  Judgment and insight appear normal.  intermittently a little  disoriented, very hard of hearing.   Condition at discharge: stable  The results of significant diagnostics from this hospitalization (including imaging, microbiology, ancillary and laboratory) are listed below for reference.   Imaging Studies: ECHOCARDIOGRAM COMPLETE Result Date: 09/13/2024    ECHOCARDIOGRAM REPORT   Patient Name:   EDGEL DEGNAN Date of Exam: 09/13/2024 Medical Rec #:  [id number]        Height:       69.0 in Accession #:    [id number]       Weight:       146.0 lb Date of Birth:  Feb 12, 1928        BSA:          1.807 m Patient Age:    89  years         BP:           131/68 mmHg Patient Gender: M                HR:           73 bpm. Exam Location:  Inpatient Procedure: 2D Echo, Cardiac Doppler and Color Doppler (Both Spectral and Color            Flow Doppler were utilized during procedure). Indications:    Aortic Stenosis 135  History:        Patient has prior history of Echocardiogram examinations, most                 recent 08/15/2024. Arrythmias:Atrial Fibrillation,                 Signs/Symptoms:Syncope; Risk Factors:Former Smoker.  Sonographer:    Merlynn Argyle Referring Phys: [id number] TAYLOR A PARCELLS  Sonographer Comments: Suboptimal subcostal window. Image acquisition challenging due to respiratory motion. IMPRESSIONS  1. Left ventricular ejection fraction, by estimation, is 60 to 65%. The left ventricle has normal function. The left ventricle has no regional wall motion abnormalities. Left ventricular diastolic parameters are consistent with Grade I diastolic dysfunction (impaired relaxation).  2. Peak RV-RA gradient 32 mmHg. Right ventricular systolic function is normal. The right ventricular size is normal.  3. Left atrial size was mild to moderately dilated.  4. The mitral valve is normal in structure. Trivial mitral valve regurgitation. No evidence of mitral stenosis.  5. The aortic valve is tricuspid. There is severe calcifcation of the aortic valve. Aortic valve regurgitation  is trivial. Moderate to severe aortic valve stenosis, possible paradoxical low flow/low gradient severe AS. Aortic valve area, by VTI measures  0.95 cm. Aortic valve mean gradient measures 26.0 mmHg.  6. IVC not visualized. FINDINGS  Left Ventricle: Left ventricular ejection fraction, by estimation, is 60 to 65%. The left ventricle has normal function. The left ventricle has no regional wall motion abnormalities. The left ventricular internal cavity size was normal in size. There is  no left ventricular hypertrophy. Left ventricular diastolic parameters are consistent with Grade I diastolic dysfunction (impaired relaxation). Right Ventricle: Peak RV-RA gradient 32 mmHg. The right ventricular size is normal. No increase in right ventricular wall thickness. Right ventricular systolic function is normal. Left Atrium: Left atrial size was mild to moderately dilated. Right Atrium: Right atrial size was normal in size. Pericardium: There is no evidence of pericardial effusion. Mitral Valve: The mitral valve is normal in structure. Trivial mitral valve regurgitation. No evidence of mitral valve stenosis. Tricuspid Valve: The tricuspid valve is normal in structure. Tricuspid valve regurgitation is trivial. Aortic Valve: The aortic valve is tricuspid. There is severe calcifcation of the aortic valve. Aortic valve regurgitation is trivial. Aortic regurgitation PHT measures 643 msec. Moderate to severe aortic stenosis is present. Aortic valve mean gradient measures 26.0 mmHg. Aortic valve peak gradient measures 41.5 mmHg. Aortic valve area, by VTI measures 0.95 cm. Pulmonic Valve: The pulmonic valve was normal in structure. Pulmonic valve regurgitation is not visualized. Aorta: The aortic root is normal in size and structure. Venous: IVC not visualized. IAS/Shunts: No atrial level shunt detected by color flow Doppler.  LEFT VENTRICLE PLAX 2D LVIDd:         3.70 cm   Diastology LVIDs:         2.50 cm   LV e' medial:  9.01  cm/s LV PW:  0.80 cm   LV e' lateral: 8.86 cm/s LV IVS:        0.80 cm LVOT diam:     2.10 cm LV SV:         71 LV SV Index:   39 LVOT Area:     3.46 cm  RIGHT VENTRICLE RV Basal diam:  3.20 cm RV S prime:     12.70 cm/s TAPSE (M-mode): 2.0 cm LEFT ATRIUM             Index        RIGHT ATRIUM           Index LA diam:        5.20 cm 2.88 cm/m   RA Area:     10.10 cm LA Vol (A2C):   76.3 ml 42.22 ml/m  RA Volume:   17.80 ml  9.85 ml/m LA Vol (A4C):   68.0 ml 37.62 ml/m LA Biplane Vol: 73.3 ml 40.56 ml/m  AORTIC VALVE AV Area (Vmax):    0.92 cm AV Area (Vmean):   0.92 cm AV Area (VTI):     0.95 cm AV Vmax:           322.00 cm/s AV Vmean:          234.750 cm/s AV VTI:            0.746 m AV Peak Grad:      41.5 mmHg AV Mean Grad:      26.0 mmHg LVOT Vmax:         85.70 cm/s LVOT Vmean:        62.500 cm/s LVOT VTI:          0.205 m LVOT/AV VTI ratio: 0.27 AI PHT:            643 msec  AORTA Ao Root diam: 3.60 cm Ao Asc diam:  3.60 cm TRICUSPID VALVE TR Peak grad:   31.6 mmHg TR Vmax:        281.00 cm/s  SHUNTS Systemic VTI:  0.20 m Systemic Diam: 2.10 cm Dalton McleanMD Electronically signed by Ezra Kanner Signature Date/Time: 09/13/2024/1:39:12 PM    Final    MR BRAIN WO CONTRAST Result Date: 09/12/2024 EXAM: MRI BRAIN WITHOUT CONTRAST 09/12/2024 07:01:25 PM TECHNIQUE: Multiplanar multisequence MRI of the head/brain was performed without the administration of intravenous contrast. COMPARISON: None available. CLINICAL HISTORY: Syncope/presyncope, cerebrovascular cause suspected. FINDINGS: BRAIN AND VENTRICLES: No acute infarct. No intracranial hemorrhage. No mass. No midline shift. No hydrocephalus. Multifocal hyperintense T2-weighted signal within the cerebral white matter, most commonly due to chronic small vessel disease. Mild volume loss. The sella is unremarkable. Normal flow voids. ORBITS: No acute abnormality. SINUSES AND MASTOIDS: No acute abnormality. BONES AND SOFT TISSUES: Normal marrow  signal. No acute soft tissue abnormality. IMPRESSION: 1. No acute intracranial abnormality. 2. Multifocal hyperintense T2-weighted signal within the cerebral white matter, most commonly due to chronic small vessel disease. Electronically signed by: Franky Stanford MD 09/12/2024 08:26 PM EST RP Workstation: [id number]   DG Chest Port 1 View Result Date: 09/12/2024 EXAM: 1 VIEW(S) XRAY OF THE CHEST 09/12/2024 09:20:00 AM COMPARISON: Chest x-ray 06/05/2018 CLINICAL HISTORY: Questionable sepsis - evaluate for abnormality FINDINGS: LUNGS AND PLEURA: Low lung volumes with bronchovascular crowding. Elevated right hemidiaphragm. Left basilar patchy opacities. No pleural effusion. No pneumothorax. HEART AND MEDIASTINUM: No acute abnormality of the cardiac and mediastinal silhouettes. BONES AND SOFT TISSUES: No acute osseous abnormality. IMPRESSION: 1. Low lung volumes with left basilar patchy  opacities. Findings may represent infectious/inflammatory process versus atelectasis. Recommend repeat PA and lateral view of the chest for further evaluation. Electronically signed by: Morgane Naveau MD 09/12/2024 01:00 PM EST RP Workstation: [id number]   DG Pelvis Portable Result Date: 09/12/2024 EXAM: 1 or 2 VIEW(S) XRAY OF THE PELVIS 09/12/2024 09:20:00 AM COMPARISON: None available. CLINICAL HISTORY: fall, left hip pain FINDINGS: BONES AND JOINTS: No acute fracture. No malalignment. Degenerative changes of the lower lumbar spine. SOFT TISSUES: Hernia repair tacks partially visualized overlying the right mid abdomen. Vascular calcifications. IMPRESSION: 1. No acute findings. Electronically signed by: Morgane Naveau MD 09/12/2024 11:34 AM EST RP Workstation: [id number]   CT Cervical Spine Wo Contrast Result Date: 09/12/2024 EXAM: CT CERVICAL SPINE WITHOUT CONTRAST 09/12/2024 09:27:34 AM TECHNIQUE: CT of the cervical spine was performed without the administration of intravenous contrast. Multiplanar reformatted images are  provided for review. Automated exposure control, iterative reconstruction, and/or weight based adjustment of the mA/kV was utilized to reduce the radiation dose to as low as reasonably achievable. COMPARISON: MRI cervical spine 11/08/2021. CLINICAL HISTORY: mechanical fall. FINDINGS: BONES AND ALIGNMENT: No acute fracture or traumatic malalignment. DEGENERATIVE CHANGES: Overall mild multilevel disc degeneration for age. Moderate multilevel facet arthrosis. Mild spinal stenosis at C3-C4. SOFT TISSUES: No prevertebral soft tissue swelling. Partially visualized patchy left upper lobe pulmonary opacities. IMPRESSION: 1. No acute cervical spine fracture or traumatic malalignment. 2. Partially visualized patchy left upper lobe pulmonary opacities, which could be infectious or inflammatory; correlate with pending chest radiograph. Electronically signed by: Dasie Hamburg MD 09/12/2024 10:02 AM EST RP Workstation: [id number]   CT Head Wo Contrast Result Date: 09/12/2024 EXAM: CT HEAD WITHOUT CONTRAST 09/12/2024 09:27:34 AM TECHNIQUE: CT of the head was performed without the administration of intravenous contrast. Automated exposure control, iterative reconstruction, and/or weight based adjustment of the mA/kV was utilized to reduce the radiation dose to as low as reasonably achievable. COMPARISON: Head CT 01/01/2013 CLINICAL HISTORY: syncopal episode, fall FINDINGS: BRAIN AND VENTRICLES: There is no evidence of an acute infarct, intracranial hemorrhage, mass, midline shift, hydrocephalus, or extra-axial fluid collection. Mild cerebral atrophy is within normal limits for age. Cerebral white matter hypodensities are nonspecific but compatible with mild chronic small vessel ischemic disease. Calcified atherosclerosis at the skull base. ORBITS: Bilateral cataract extraction. SINUSES: Scattered mild, partially polypoid mucosal thickening in the paranasal sinuses. Clear mastoid air cells. SOFT TISSUES AND SKULL: No acute soft  tissue abnormality. No skull fracture. IMPRESSION: 1. No acute intracranial abnormality. 2. Mild chronic small vessel ischemic disease. Electronically signed by: Dasie Hamburg MD 09/12/2024 09:57 AM EST RP Workstation: [id number]   CARDIAC CATHETERIZATION Result Date: 09/06/2024 Coronary angiography 09/06/2024: LM: Proximal 20% stenosis LAD: Severe proximal calcification but no significant luminal stenosis Lcx: Severe proximal calcification but no significant luminal stenosis RCA: Dominant vessel. Severe proximal calcification but no significant luminal stenosis LVEDP 18 mmHg Right heart catheterization 09/06/2024: RA: 7 mmHg RV: 42/0 mmHg PA: 35/14 mmHg, mPAP 23 mmHg PCW: 11 mmHg AO sats: 95% PA sats: 76% CO: 7.1 L/min CI: 3.9 L/min/m2 Aortic valve hemodynamics (simultaneous LV-Ao measurement using Langston catheter): Mean PG 42 mmHg AVA 1.07 cm2 AVAi 0.59 cm/m2 Conclusion: Mild nonobstructive coronary artery disease Severe aortic stenosis Fairly normal fil;ling pressures Recommendation: Continue TAVR workup Newman JINNY Lawrence, MD    Microbiology: Results for orders placed or performed during the hospital encounter of 09/12/24  Resp panel by RT-PCR (RSV, Flu A&B, Covid) Anterior Nasal Swab     Status: None   Collection Time:  09/12/24  9:05 AM   Specimen: Anterior Nasal Swab  Result Value Ref Range Status   SARS Coronavirus 2 by RT PCR NEGATIVE NEGATIVE Final   Influenza A by PCR NEGATIVE NEGATIVE Final   Influenza B by PCR NEGATIVE NEGATIVE Final    Comment: (NOTE) The Xpert Xpress SARS-CoV-2/FLU/RSV plus assay is intended as an aid in the diagnosis of influenza from Nasopharyngeal swab specimens and should not be used as a sole basis for treatment. Nasal washings and aspirates are unacceptable for Xpert Xpress SARS-CoV-2/FLU/RSV testing.  Fact Sheet for Patients: bloggercourse.com  Fact Sheet for Healthcare  Providers: seriousbroker.it  This test is not yet approved or cleared by the [place]  FDA and has been authorized for detection and/or diagnosis of SARS-CoV-2 by FDA under an Emergency Use Authorization (EUA). This EUA will remain in effect (meaning this test can be used) for the duration of the COVID-19 declaration under Section 564(b)(1) of the Act, 21 U.S.C. section 360bbb-3(b)(1), unless the authorization is terminated or revoked.     Resp Syncytial Virus by PCR NEGATIVE NEGATIVE Final    Comment: (NOTE) Fact Sheet for Patients: bloggercourse.com  Fact Sheet for Healthcare Providers: seriousbroker.it  This test is not yet approved or cleared by the [place]  FDA and has been authorized for detection and/or diagnosis of SARS-CoV-2 by FDA under an Emergency Use Authorization (EUA). This EUA will remain in effect (meaning this test can be used) for the duration of the COVID-19 declaration under Section 564(b)(1) of the Act, 21 U.S.C. section 360bbb-3(b)(1), unless the authorization is terminated or revoked.  Performed at [facility], [address], [place], [place] [address]   Blood Culture (routine x 2)     Status: None (Preliminary result)   Collection Time: 09/12/24  9:09 AM   Specimen: BLOOD  Result Value Ref Range Status   Specimen Description BLOOD RIGHT ANTECUBITAL  Final   Special Requests   Final    BOTTLES DRAWN AEROBIC AND ANAEROBIC Blood Culture adequate volume   Culture   Final    NO GROWTH 4 DAYS Performed at [facility], [address] [place], [place] [address]    Report Status PENDING  Incomplete  Blood Culture (routine x 2)     Status: None (Preliminary result)   Collection Time: 09/12/24  9:09 AM   Specimen: BLOOD  Result Value Ref Range Status   Specimen Description BLOOD BLOOD RIGHT ARM  Final   Special Requests   Final    BOTTLES DRAWN AEROBIC AND  ANAEROBIC Blood Culture results may not be optimal due to an inadequate volume of blood received in culture bottles   Culture   Final    NO GROWTH 4 DAYS Performed at [facility], [address], [place], [place] [address]    Report Status PENDING  Incomplete  MRSA Next Gen by PCR, Nasal     Status: None   Collection Time: 09/13/24 12:23 PM   Specimen: Nasal Mucosa; Nasal Swab  Result Value Ref Range Status   MRSA by PCR Next Gen NOT DETECTED NOT DETECTED Final    Comment: (NOTE) The GeneXpert MRSA Assay (FDA approved for NASAL specimens only), is one component of a comprehensive MRSA colonization surveillance program. It is not intended to diagnose MRSA infection nor to guide or monitor treatment for MRSA infections. Test performance is not FDA approved in patients less than 59 years old. Performed at [facility], [address],  [place], [place] [address]     Labs: CBC: Recent Labs  Lab 09/12/24 0909 09/13/24 0515  WBC 18.0* 12.0*  NEUTROABS 16.6* 9.7*  HGB 13.6 11.5*  HCT 39.4 33.0*  MCV 94.7 94.8  PLT 202 171   Basic Metabolic Panel: Recent Labs  Lab 09/12/24 0909 09/13/24 0515 09/13/24 1259 09/14/24 0557  NA 133* 132*  --  130*  K 3.3* 3.8  --  3.7  CL 97* 101  --  98  CO2 26 24  --  25  GLUCOSE 103* 97  --  97  BUN 12 12  --  10  CREATININE 0.72 0.64  --  0.63  CALCIUM 9.1 8.3*  --  8.4*  MG  --  1.7 1.7  --   PHOS  --  2.1*  --   --    Liver Function Tests: Recent Labs  Lab 09/12/24 0909 09/13/24 0515 09/14/24 0557  AST 28 27 27   ALT 12 12 12   ALKPHOS 42 34* 41  BILITOT 1.4* 1.4* 1.5*  PROT 6.3* 5.3* 5.7*  ALBUMIN 3.6 3.0* 3.1*   CBG: No results for input(s): GLUCAP in the last 168 hours.  Discharge time spent: approximately 45 minutes spent on discharge counseling, evaluation of patient on day of discharge, and coordination of discharge planning with nursing, social work, pharmacy and case management  Signed: Lonni SHAUNNA Dalton, MD [facility] 09/15/2024         "

## 2024-09-17 LAB — CULTURE, BLOOD (ROUTINE X 2)
Culture: NO GROWTH
Culture: NO GROWTH
Special Requests: ADEQUATE

## 2024-10-04 ENCOUNTER — Ambulatory Visit (HOSPITAL_COMMUNITY)
Admission: RE | Admit: 2024-10-04 | Discharge: 2024-10-04 | Disposition: A | Discharge status: Home / Self Care | Source: Ambulatory Visit | Attending: Cardiology | Admitting: Cardiology

## 2024-10-04 DIAGNOSIS — I350 Nonrheumatic aortic (valve) stenosis: Secondary | ICD-10-CM | POA: Insufficient documentation

## 2024-10-04 MED ORDER — IOHEXOL 350 MG/ML SOLN
100.0000 mL | Freq: Once | INTRAVENOUS | Status: AC | PRN
  Administered 2024-10-04: 100 mL via INTRAVENOUS

## 2024-10-04 NOTE — Progress Notes (Signed)
 Procedure Type: Isolated AVR Perioperative Outcome Estimate % Operative Mortality 6.68% Morbidity & Mortality 13% Stroke 2.25% Renal Failure 1.9% Reoperation 3.83% Prolonged Ventilation 5.7% Deep Sternal Wound Infection 0.028% Long Hospital Stay (>14 days) 10.6% Short Hospital Stay (<6 days)* 18.4%

## 2024-10-07 ENCOUNTER — Encounter: Payer: Self-pay | Admitting: Physician Assistant

## 2024-10-11 NOTE — Progress Notes (Signed)
 " [facility]   MULTIDISCIPLINARY HEART VALVE CLINIC    [facility] Medical Record #[id number] Date of Birth: 1928-07-16  Referring: Victor Lonni LITTIE, MD Primary Care: Victor Elsie JONETTA Mickey., MD Primary Cardiologist:Victor Sutton Kate, MD  Chief Complaint:    Chief Complaint  Patient presents with   Aortic Stenosis    TAVR consult    History of Present Illness:     Victor Sutton is a 89 y.o. male presents for evaluation of severe aortic stenosis and TAVR. He was recently admitted to the hospital from 12/29-1/1 for sepsis secondary to pneumonia.  Per his wife, he fell on 12/29 and might have had LOC.  His SBP was 60 on EMS evaluation.  Since discharge he has been getting home PT and has completed his course of antibiotics.  His appetite is slowly improving and he is eating a lot more protein.  Prior to this event, he reports being very functional.  He and his wife live in a house and prior to recent admission, he was using a cane sporadically but otherwise ambulating independently.  Now he is using a walker all the time and came to the clinic in a wheelchair.  He also has a history of myasthenia gravis - he stopped Cellcept  late summer per his neurologist.  His symptoms have been controlled since then.  He continues to have shortness of breath and also has orthopnea.  He denies chest pain.  My measurements: Annulus - Area 536 mm2, Dia 26.1 mm, Perimeter 84.1 mm Sinuses - R 33.6 mm, L 35.1 mm, N 36.3 mm Coronaries - R 16.2 mm, L 16.9 mm Access - Right CFA has a chunk of calcium just proximal to access site. Left CFA also has a chunk of calcium but seems smaller than right. Would probably favor left side for working side.  Past Medical History:  Diagnosis Date   BPH (benign prostatic hyperplasia)    Ole ulcer    Cataract 2009   bilateral cataract surgery   Delayed gastric emptying    Gait abnormality 11/29/2019   GERD  (gastroesophageal reflux disease)    Hiatal hernia    hiatial hernia repair  02/08/13   History of blood transfusion    Hyperlipidemia    not on medication   Hypertension    Iron  deficiency anemia    Macular degeneration    (L) wet, (R) dry   Myasthenia gravis (HCC) 03/21/2013   Peripheral neuropathy    Mild History of   Squamous cell carcinoma of skin 05/03/2008   SCC INSITU RIGHT V OF CHEST TX EXC   Vitamin D deficiency     Past Surgical History:  Procedure Laterality Date   Arthroscopic knee surgery     left   cateract surgeries      bilateral   CHOLECYSTECTOMY     COLONOSCOPY WITH PROPOFOL  N/A 02/16/2017   Procedure: COLONOSCOPY WITH PROPOFOL ;  Surgeon: Louder Gladis POUR, MD;  Location: [facility];  Service: Endoscopy;  Laterality: N/A;   GASTROSTOMY W/ FEEDING TUBE  [id number]   HERNIA REPAIR     Laparoscopic ventral   HIATAL HERNIA REPAIR  2004   HIATAL HERNIA REPAIR N/A 02/08/2013   Procedure:  REPAIR OFCURRENT HIATAL HERNIA, ;  Surgeon: Krystal JINNY Russell, MD;  Location: [facility];  Service: General;  Laterality: N/A;   LAPAROSCOPIC LYSIS OF ADHESIONS N/A 02/08/2013   Procedure: LAPAROSCOPIC LYSIS OF ADHESIONS;  Surgeon: Krystal JINNY Russell, MD;  Location: [facility];  Service: General;  Laterality: N/A;   LAPAROSCOPIC NISSEN FUNDOPLICATION  02/08/2013   Procedure: LAPAROSCOPIC NISSEN FUNDOPLICATION;  Surgeon: Krystal JINNY Russell, MD;  Location: [facility];  Service: General;;   Multiple oral surgeries  2002   RIGHT/LEFT HEART CATH AND CORONARY ANGIOGRAPHY N/A 09/06/2024   Procedure: RIGHT/LEFT HEART CATH AND CORONARY ANGIOGRAPHY;  Surgeon: Elmira Newman JINNY, MD;  Location: [facility];  Service: Cardiovascular;  Laterality: N/A;   SKIN CANCER EXCISION  10/2012   right leg-shin area   STOMACH SURGERY  2005   states his stomach had moved up toward his chest , some type of surgery performed   TONSILLECTOMY     UPPER GI ENDOSCOPY N/A 02/08/2013   Procedure: UPPER GI ENDOSCOPY;   Surgeon: Krystal JINNY Russell, MD;  Location: [facility];  Service: General;  Laterality: N/A;    Social History:  Tobacco Use History[1]  Social History   Substance and Sexual Activity  Alcohol  Use No   Comment: Moderate alcohol , wine     Allergies[2]    Current Outpatient Medications  Medication Sig Dispense Refill   Apoaequorin (PREVAGEN) 10 MG CAPS Take 1 Capful by mouth in the morning.     ascorbic acid (VITAMIN C) 250 MG tablet Take 250 mg by mouth 2 (two) times daily. Am + dinner     CALCIUM PO Take 1 tablet by mouth daily.     denosumab  (PROLIA ) 60 MG/ML SOSY injection Inject 60 mg into the skin every 6 (six) months.     Lutein 40 MG CAPS Take 40 mg by mouth daily.     Multiple Vitamins-Minerals (ICAPS) CAPS Take 1 capsule by mouth in the morning and at bedtime. Preservision: Take in 1 capsule in morning and 1 in evening at dinner time.     pantoprazole  (PROTONIX ) 40 MG tablet Take 40 mg by mouth daily. May take an additional 40mg s as needed for heartburn     Polyethyl Glycol-Propyl Glycol (SYSTANE ULTRA) 0.4-0.3 % SOLN Apply 1-2 drops to eye daily as needed (dry eye).     Probiotic Product (ALIGN) 4 MG CAPS Take 4 mg by mouth daily.     pyridostigmine  (MESTINON ) 60 MG tablet Take 0.5 tablets (30 mg total) by mouth 2 (two) times daily as needed. 150 tablet 3   tamsulosin  (FLOMAX ) 0.4 MG CAPS capsule Take 0.4 mg by mouth at bedtime.  3   UNABLE TO FIND Take 1 tablet by mouth daily. Med Name:EyePromise  Zeaxanthin 10mg  and lutein 10mg      Vitamin D, Ergocalciferol, (DRISDOL) 50000 UNITS CAPS capsule Take 50,000 Units by mouth every 7 (seven) days. ON FRIDAYS     [Paused] hydrochlorothiazide  (HYDRODIURIL ) 25 MG tablet Take 1 tablet (25 mg total) by mouth daily. (Patient not taking: Reported on 10/13/2024) 90 tablet 1   [Paused] potassium chloride  (KLOR-CON ) 10 MEQ tablet TAKE 1 TABLET BY MOUTH ONCE DAILY (Patient not taking: Reported on 10/13/2024) 90 tablet 3   No current  facility-administered medications for this visit.    (Not in a hospital admission)   Family History  Problem Relation Age of Onset   Diabetes Father    Heart attack Father    Breast cancer Sister    CAD Sister    COPD Sister    Prostate cancer Paternal Grandfather    Colon cancer Son 21   Myasthenia gravis Son    Colon cancer Son 85     Review of Systems:   Review  of Systems  Constitutional:  Positive for malaise/fatigue.  Respiratory:  Positive for shortness of breath.   Cardiovascular:  Negative for chest pain, palpitations and leg swelling.  Gastrointestinal:  Negative for nausea and vomiting.  Neurological:  Positive for dizziness and loss of consciousness.      Physical Exam: BP 130/73   Pulse 62   Resp 18   Ht 5' 9 (1.753 m)   Wt 146 lb (66.2 kg)   SpO2 97% Comment: RA  BMI 21.56 kg/m  Physical Exam Constitutional:      Comments: Appears frail  Cardiovascular:     Rate and Rhythm: Normal rate and regular rhythm.     Heart sounds: Murmur heard.  Pulmonary:     Effort: No respiratory distress.     Breath sounds: Normal breath sounds.  Abdominal:     General: There is no distension.     Palpations: Abdomen is soft.     Tenderness: There is no abdominal tenderness.  Musculoskeletal:     Right lower leg: Edema (mild) present.     Left lower leg: Edema (mild) present.     Cardiac Studies & Procedures   ______________________________________________________________________________________________ CARDIAC CATHETERIZATION  CARDIAC CATHETERIZATION 09/06/2024  Conclusion Coronary angiography 09/06/2024: LM: Proximal 20% stenosis LAD: Severe proximal calcification but no significant luminal stenosis Lcx: Severe proximal calcification but no significant luminal stenosis RCA: Dominant vessel. Severe proximal calcification but no significant luminal stenosis  LVEDP 18 mmHg  Right heart catheterization 09/06/2024: RA: 7 mmHg RV: 42/0 mmHg PA: 35/14  mmHg, mPAP 23 mmHg PCW: 11 mmHg  AO sats: 95% PA sats: 76%  CO: 7.1 L/min CI: 3.9 L/min/m2  Aortic valve hemodynamics (simultaneous LV-Ao measurement using Langston catheter): Mean PG 42 mmHg AVA 1.07 cm2 AVAi 0.59 cm/m2  Conclusion: Mild nonobstructive coronary artery disease Severe aortic stenosis Fairly normal fil;ling pressures  Recommendation: Continue TAVR workup  Manish JINNY Lawrence, MD  Findings Coronary Findings Diagnostic  Dominance: Right  Left Main Ost LM lesion is 20% stenosed.  Left Anterior Descending Vessel is normal in caliber. The vessel exhibits minimal luminal irregularities.  Left Circumflex Vessel is normal in caliber. The vessel exhibits minimal luminal irregularities.  Right Coronary Artery Vessel is normal in caliber. The vessel exhibits minimal luminal irregularities.  Intervention  No interventions have been documented.     ECHOCARDIOGRAM  ECHOCARDIOGRAM COMPLETE 09/13/2024  Narrative ECHOCARDIOGRAM REPORT    Patient Name:   SHADOW SCHEDLER Date of Exam: 09/13/2024 Medical Rec #:  [id number]        Height:       69.0 in Accession #:    [id number]       Weight:       146.0 lb Date of Birth:  08/08/1928        BSA:          1.807 m Patient Age:    89 years         BP:           131/68 mmHg Patient Gender: M                HR:           73 bpm. Exam Location:  Inpatient  Procedure: 2D Echo, Cardiac Doppler and Color Doppler (Both Spectral and Color Flow Doppler were utilized during procedure).  Indications:    Aortic Stenosis 135  History:        Patient has prior history of Echocardiogram examinations, most recent 08/15/2024.  Arrythmias:Atrial Fibrillation, Signs/Symptoms:Syncope; Risk Factors:Former Smoker.  Sonographer:    Merlynn Argyle Referring Phys: [id number] TAYLOR A PARCELLS   Sonographer Comments: Suboptimal subcostal window. Image acquisition challenging due to respiratory motion. IMPRESSIONS   1. Left  ventricular ejection fraction, by estimation, is 60 to 65%. The left ventricle has normal function. The left ventricle has no regional wall motion abnormalities. Left ventricular diastolic parameters are consistent with Grade I diastolic dysfunction (impaired relaxation). 2. Peak RV-RA gradient 32 mmHg. Right ventricular systolic function is normal. The right ventricular size is normal. 3. Left atrial size was mild to moderately dilated. 4. The mitral valve is normal in structure. Trivial mitral valve regurgitation. No evidence of mitral stenosis. 5. The aortic valve is tricuspid. There is severe calcifcation of the aortic valve. Aortic valve regurgitation is trivial. Moderate to severe aortic valve stenosis, possible paradoxical low flow/low gradient severe AS. Aortic valve area, by VTI measures 0.95 cm. Aortic valve mean gradient measures 26.0 mmHg. 6. IVC not visualized.  FINDINGS Left Ventricle: Left ventricular ejection fraction, by estimation, is 60 to 65%. The left ventricle has normal function. The left ventricle has no regional wall motion abnormalities. The left ventricular internal cavity size was normal in size. There is no left ventricular hypertrophy. Left ventricular diastolic parameters are consistent with Grade I diastolic dysfunction (impaired relaxation).  Right Ventricle: Peak RV-RA gradient 32 mmHg. The right ventricular size is normal. No increase in right ventricular wall thickness. Right ventricular systolic function is normal.  Left Atrium: Left atrial size was mild to moderately dilated.  Right Atrium: Right atrial size was normal in size.  Pericardium: There is no evidence of pericardial effusion.  Mitral Valve: The mitral valve is normal in structure. Trivial mitral valve regurgitation. No evidence of mitral valve stenosis.  Tricuspid Valve: The tricuspid valve is normal in structure. Tricuspid valve regurgitation is trivial.  Aortic Valve: The aortic valve is  tricuspid. There is severe calcifcation of the aortic valve. Aortic valve regurgitation is trivial. Aortic regurgitation PHT measures 643 msec. Moderate to severe aortic stenosis is present. Aortic valve mean gradient measures 26.0 mmHg. Aortic valve peak gradient measures 41.5 mmHg. Aortic valve area, by VTI measures 0.95 cm.  Pulmonic Valve: The pulmonic valve was normal in structure. Pulmonic valve regurgitation is not visualized.  Aorta: The aortic root is normal in size and structure.  Venous: IVC not visualized.  IAS/Shunts: No atrial level shunt detected by color flow Doppler.   LEFT VENTRICLE PLAX 2D LVIDd:         3.70 cm   Diastology LVIDs:         2.50 cm   LV e' medial:  9.01 cm/s LV PW:         0.80 cm   LV e' lateral: 8.86 cm/s LV IVS:        0.80 cm LVOT diam:     2.10 cm LV SV:         71 LV SV Index:   39 LVOT Area:     3.46 cm   RIGHT VENTRICLE RV Basal diam:  3.20 cm RV S prime:     12.70 cm/s TAPSE (M-mode): 2.0 cm  LEFT ATRIUM             Index        RIGHT ATRIUM           Index LA diam:        5.20 cm 2.88 cm/m   RA Area:  10.10 cm LA Vol (A2C):   76.3 ml 42.22 ml/m  RA Volume:   17.80 ml  9.85 ml/m LA Vol (A4C):   68.0 ml 37.62 ml/m LA Biplane Vol: 73.3 ml 40.56 ml/m AORTIC VALVE AV Area (Vmax):    0.92 cm AV Area (Vmean):   0.92 cm AV Area (VTI):     0.95 cm AV Vmax:           322.00 cm/s AV Vmean:          234.750 cm/s AV VTI:            0.746 m AV Peak Grad:      41.5 mmHg AV Mean Grad:      26.0 mmHg LVOT Vmax:         85.70 cm/s LVOT Vmean:        62.500 cm/s LVOT VTI:          0.205 m LVOT/AV VTI ratio: 0.27 AI PHT:            643 msec  AORTA Ao Root diam: 3.60 cm Ao Asc diam:  3.60 cm  TRICUSPID VALVE TR Peak grad:   31.6 mmHg TR Vmax:        281.00 cm/s  SHUNTS Systemic VTI:  0.20 m Systemic Diam: 2.10 cm  Dalton McleanMD Electronically signed by Ezra Kanner Signature Date/Time: 09/13/2024/1:39:12  PM    Final    MONITORS  LONG TERM MONITOR (3-14 DAYS) 01/02/2021  Narrative  Frequent PACs (6.4%)  Occasional PVCs (1.7%).  6 episodes of SVT, longest lasting 14 beats   Patch Wear Time:  13 days and 23 hours (2022-03-31T11:36:41-0400 to 2022-04-14T11:19:58-0400)  Patient had a min HR of 44 bpm, max HR of 160 bpm, and avg HR of 75 bpm. Predominant underlying rhythm was Sinus Rhythm. 6 Supraventricular Tachycardia runs occurred, the run with the fastest interval lasting 10 beats with a max rate of 160 bpm, the longest lasting 14 beats with an avg rate of 122 bpm. Isolated SVEs were frequent (6.4%, [id number]), SVE Couplets were rare (<1.0%, 432), and SVE Triplets were rare (<1.0%, 15). Isolated VEs were occasional (1.7%, [id number]), VE Couplets were rare (<1.0%, 396), and VE Triplets were rare (<1.0%, 17). Ventricular Bigeminy and Trigeminy were present. Difficulty discerning atrial activity making definitive diagnosis difficult to ascertain.   CT SCANS  CT CORONARY MORPH W/CTA COR W/SCORE 10/04/2024  Addendum 10/09/2024 11:59 AM ADDENDUM REPORT: 10/09/2024 11:57  EXAM: OVER-READ INTERPRETATION  CT CHEST  The following report is an over-read performed by radiologist Dr. Andrea Gasman of [facility] on 10/09/2024. This over-read does not include interpretation of cardiac or coronary anatomy or pathology. The coronary CTA interpretation by the cardiologist is attached.  COMPARISON:  Concurrent chest CTA, reported separately.  FINDINGS: Aortic atherosclerosis. Elevated right hemidiaphragm with adjacent atelectasis/scarring in the right middle lobe. Scoliotic curvature and degenerative change in the spine.  IMPRESSION: 1. Please reference concurrently performed chest CTA for complete thoracic assessment. 2.  Aortic Atherosclerosis (ICD10-I70.0).   Electronically Signed By: Andrea Gasman M.D. On: 10/09/2024 11:57  Narrative CLINICAL DATA:  73M with severe  aortic stenosis being evaluated for a TAVR procedure.  EXAM: Cardiac TAVR CT  TECHNIQUE: A non-contrast, gated CT scan was obtained with axial slices of 2.5 mm through the heart for aortic valve scoring. A 120 kV retrospective, gated, contrast cardiac scan was obtained. Gantry rotation speed was 230 msec and collimation was 0.63 mm. Nitroglycerin was not given. A delayed  scan was obtained to exclude left atrial appendage thrombus. The 3D dataset was reconstructed in systole with motion correction. The 3D data set was reconstructed in 5% intervals of the 0-95% of the R-R cycle. Systolic and diastolic phases were analyzed on a dedicated workstation using MPR, MIP, and VRT modes. The patient received 100 cc of contrast.  FINDINGS: Aortic Root:  Aortic valve: trileaflet  Aortic valve calcium score: 1784  Aortic annulus:  Diameter: 30mm x 25mm  Perimeter: 84mm  Area: 522mm^2  Calcifications: No calcifications  Coronary height: Min Left - 16mm ; Min Right - 16mm  Sinotubular height: Left cusp - 24mm; Right cusp - 22mm; Noncoronary cusp - 24mm  LVOT (as measured 3 mm below the annulus):  Diameter: 30mm x 24mm  Area: 578mm^2  Calcifications: No calcifications  Aortic sinus width: Left cusp - 34mm; Right cusp - 33mm; Noncoronary cusp - 35mm  Sinotubular junction width: 31mm x 30mm  Optimum Fluoroscopic Angle for Delivery: LAO 10 CAU 0  Cardiac:  Right atrium: Mild enlargement  Right ventricle: Normal size  Pulmonary arteries: Dilated right pulmonary artery measuring 31mm  Pulmonary veins: Normal configuration  Left atrium: Mild enlargement  Left ventricle: Normal size  Pericardium: Normal thickness  Coronary arteries: Coronary calcium score 2194  IMPRESSION: 1. Tricuspid aortic valve with moderate to severe calcifications (AV calcium score 1784)  2. Aortic annulus measures 30mm x 25mm in diameter with perimeter 84mm and area 560mm^2. No annular or  LVOT calcifications. Annular measurements are suitable for delivery of 26mm Edwards Sapien 3 valve, though is right on the border between a 26mm and 29mm valve.  3. Sufficient coronary to annulus distance.  4. Optimum Fluoroscopic Angle for Delivery:  LAO 10 CAU 0  5. Coronary calcium score 2194  6. Dilated right pulmonary artery measuring 31mm  Electronically Signed: By: Lonni Nanas M.D. On: 10/04/2024 18:17     ______________________________________________________________________________________________      ECG NSR with 1st degree AV block    I have independently reviewed the above radiologic studies and discussed with the patient   Recent Lab Findings: Lab Results  Component Value Date   WBC 12.0 (H) 09/13/2024   HGB 11.5 (L) 09/13/2024   HCT 33.0 (L) 09/13/2024   PLT 171 09/13/2024   GLUCOSE 97 09/14/2024   CHOL 118 02/14/2013   TRIG 79 02/21/2013   ALT 12 09/14/2024   AST 27 09/14/2024   NA 130 (L) 09/14/2024   K 3.7 09/14/2024   CL 98 09/14/2024   CREATININE 0.63 09/14/2024   BUN 10 09/14/2024   CO2 25 09/14/2024   TSH 1.900 10/24/2021   INR 1.1 09/12/2024      Assessment / Plan:   89 y.o. male with severe aortic stenosis.  STS score: 6.68%.  NYHA Class III.  He was recently admitted with pneumonia and sepsis but seems to be recovering adequately at home. I advised him to continue working on strength and endurance with PT as well as continue eating a high-protein diet  The risks and benefits of transfemoral TAVR were discussed in detail.  The risks included death, stroke, paravalvular leak, aortic dissection, annulus rupture, device embolization, acute myocardial infarction, arrhythmia, heart block or need for permanent pacemaker.  We also discussed possibility of an emergent sternotomy to address any procedural complications.  Based on our discussion, we collectively decided that an emergent sternotomy would not be indicated.  The patient is  agreeable to proceed.  Based on my review of his  LHC, echo, and CTA, I agree with the multidisciplinary plan to proceed with a transfemoral 26 mm S3 UR TAVR.  I  spent 30 minutes counseling the patient face to face.   Con RAMAN Ernestine Langworthy 10/13/2024 1:06 PM      [1]  Social History Tobacco Use  Smoking Status Former   Current packs/day: 0.00   Types: Cigarettes   Quit date: 09/15/1976   Years since quitting: 48.1  Smokeless Tobacco Never  Tobacco Comments   Quit 1980  [2]  Allergies Allergen Reactions   Ciprofloxacin  Anaphylaxis    Avoid due to myasthenia gravis  Other Reaction(s): Not to take with MS   Quinolones Anaphylaxis    Avoid due to myasthenia gravis   "

## 2024-10-13 ENCOUNTER — Ambulatory Visit

## 2024-10-13 VITALS — BP 130/73 | HR 62 | Resp 18 | Ht 69.0 in | Wt 146.0 lb

## 2024-10-13 DIAGNOSIS — I350 Nonrheumatic aortic (valve) stenosis: Secondary | ICD-10-CM | POA: Diagnosis not present

## 2024-10-17 ENCOUNTER — Other Ambulatory Visit: Payer: Self-pay

## 2024-10-17 DIAGNOSIS — I350 Nonrheumatic aortic (valve) stenosis: Secondary | ICD-10-CM

## 2024-10-21 ENCOUNTER — Inpatient Hospital Stay (HOSPITAL_COMMUNITY): Admission: RE | Admit: 2024-10-21

## 2024-10-21 ENCOUNTER — Other Ambulatory Visit: Payer: Self-pay

## 2024-10-21 ENCOUNTER — Encounter (HOSPITAL_COMMUNITY): Payer: Self-pay

## 2024-10-21 ENCOUNTER — Ambulatory Visit (HOSPITAL_COMMUNITY): Admission: RE | Admit: 2024-10-21 | Source: Ambulatory Visit

## 2024-10-21 DIAGNOSIS — I350 Nonrheumatic aortic (valve) stenosis: Secondary | ICD-10-CM

## 2024-10-21 DIAGNOSIS — Z01818 Encounter for other preprocedural examination: Secondary | ICD-10-CM

## 2024-10-21 LAB — COMPREHENSIVE METABOLIC PANEL WITH GFR
ALT: 26 U/L (ref 0–44)
AST: 29 U/L (ref 15–41)
Albumin: 3.9 g/dL (ref 3.5–5.0)
Alkaline Phosphatase: 52 U/L (ref 38–126)
Anion gap: 9 (ref 5–15)
BUN: 18 mg/dL (ref 8–23)
CO2: 27 mmol/L (ref 22–32)
Calcium: 9.2 mg/dL (ref 8.9–10.3)
Chloride: 100 mmol/L (ref 98–111)
Creatinine, Ser: 0.84 mg/dL (ref 0.61–1.24)
GFR, Estimated: >60 mL/min
Glucose, Bld: 93 mg/dL (ref 70–99)
Potassium: 3.8 mmol/L (ref 3.5–5.1)
Sodium: 136 mmol/L (ref 135–145)
Total Bilirubin: 1.5 mg/dL — ABNORMAL HIGH (ref 0.0–1.2)
Total Protein: 7.2 g/dL (ref 6.5–8.1)

## 2024-10-21 LAB — CBC
HCT: 41.2 % (ref 39.0–52.0)
Hemoglobin: 13.8 g/dL (ref 13.0–17.0)
MCH: 32.3 pg (ref 26.0–34.0)
MCHC: 33.5 g/dL (ref 30.0–36.0)
MCV: 96.5 fL (ref 80.0–100.0)
Platelets: 219 10*3/uL (ref 150–400)
RBC: 4.27 MIL/uL (ref 4.22–5.81)
RDW: 14.4 % (ref 11.5–15.5)
WBC: 6.6 10*3/uL (ref 4.0–10.5)
nRBC: 0 % (ref 0.0–0.2)

## 2024-10-21 LAB — URINALYSIS, ROUTINE W REFLEX MICROSCOPIC
Bilirubin Urine: NEGATIVE
Glucose, UA: NEGATIVE mg/dL
Hgb urine dipstick: NEGATIVE
Ketones, ur: NEGATIVE mg/dL
Leukocytes,Ua: NEGATIVE
Nitrite: NEGATIVE
Protein, ur: NEGATIVE mg/dL
Specific Gravity, Urine: 1.021 (ref 1.005–1.030)
pH: 6 (ref 5.0–8.0)

## 2024-10-21 LAB — SURGICAL PCR SCREEN
MRSA, PCR: NEGATIVE
Staphylococcus aureus: NEGATIVE

## 2024-10-21 LAB — PROTIME-INR
INR: 1.1 (ref 0.8–1.2)
Prothrombin Time: 14.6 s (ref 11.4–15.2)

## 2024-10-21 LAB — TYPE AND SCREEN
ABO/RH(D): A POS
Antibody Screen: NEGATIVE

## 2024-10-21 NOTE — Progress Notes (Signed)
 All consents signed by patient at PAT lab appointment. Pt was sent home with printed copy of surgical instructions and CHG soap/CHG soap instructions. All instructions reviewed with patient and questions answered.  Patients chart send to anesthesia for review. Pt denies any respiratory illness/infection in the last two months.    Notified Katie of patient's EKG.

## 2024-10-25 ENCOUNTER — Inpatient Hospital Stay (HOSPITAL_COMMUNITY): Admission: RE | Admit: 2024-10-25 | Admitting: Cardiovascular Disease

## 2024-10-25 ENCOUNTER — Encounter (HOSPITAL_COMMUNITY): Admission: RE | Payer: Self-pay

## 2024-10-25 DIAGNOSIS — I350 Nonrheumatic aortic (valve) stenosis: Secondary | ICD-10-CM

## 2024-11-23 ENCOUNTER — Ambulatory Visit: Admitting: Cardiology

## 2025-01-19 ENCOUNTER — Ambulatory Visit: Admitting: Neurology
